# Patient Record
Sex: Male | Born: 1935 | Race: White | Hispanic: No | Marital: Married | Smoking: Never smoker
Health system: Southern US, Community
[De-identification: ages and names within clinical notes are randomized; demographics above are authoritative.]

## PROBLEM LIST (undated history)

## (undated) DIAGNOSIS — R972 Elevated prostate specific antigen [PSA]: Secondary | ICD-10-CM

## (undated) DIAGNOSIS — D4100 Neoplasm of uncertain behavior of unspecified kidney: Secondary | ICD-10-CM

## (undated) DIAGNOSIS — I10 Essential (primary) hypertension: Secondary | ICD-10-CM

## (undated) DIAGNOSIS — R5381 Other malaise: Secondary | ICD-10-CM

## (undated) DIAGNOSIS — D4120 Neoplasm of uncertain behavior of unspecified ureter: Secondary | ICD-10-CM

## (undated) DIAGNOSIS — M25569 Pain in unspecified knee: Secondary | ICD-10-CM

## (undated) DIAGNOSIS — L570 Actinic keratosis: Secondary | ICD-10-CM

## (undated) DIAGNOSIS — R1310 Dysphagia, unspecified: Secondary | ICD-10-CM

## (undated) DIAGNOSIS — K210 Gastro-esophageal reflux disease with esophagitis: Secondary | ICD-10-CM

## (undated) DIAGNOSIS — IMO0002 Reserved for concepts with insufficient information to code with codable children: Secondary | ICD-10-CM

## (undated) DIAGNOSIS — R202 Paresthesia of skin: Secondary | ICD-10-CM

## (undated) DIAGNOSIS — M25539 Pain in unspecified wrist: Secondary | ICD-10-CM

## (undated) DIAGNOSIS — R209 Unspecified disturbances of skin sensation: Secondary | ICD-10-CM

## (undated) DIAGNOSIS — I4891 Unspecified atrial fibrillation: Secondary | ICD-10-CM

## (undated) DIAGNOSIS — M461 Sacroiliitis, not elsewhere classified: Secondary | ICD-10-CM

## (undated) DIAGNOSIS — C4491 Basal cell carcinoma of skin, unspecified: Secondary | ICD-10-CM

## (undated) DIAGNOSIS — K648 Other hemorrhoids: Secondary | ICD-10-CM

## (undated) DIAGNOSIS — Z800 Family history of malignant neoplasm of digestive organs: Secondary | ICD-10-CM

## (undated) DIAGNOSIS — K219 Gastro-esophageal reflux disease without esophagitis: Secondary | ICD-10-CM

## (undated) DIAGNOSIS — C44621 Squamous cell carcinoma of skin of unspecified upper limb, including shoulder: Secondary | ICD-10-CM

## (undated) DIAGNOSIS — R5383 Other fatigue: Secondary | ICD-10-CM

## (undated) DIAGNOSIS — Z86010 Personal history of colonic polyps: Secondary | ICD-10-CM

## (undated) DIAGNOSIS — K5790 Diverticulosis of intestine, part unspecified, without perforation or abscess without bleeding: Secondary | ICD-10-CM

## (undated) DIAGNOSIS — Z8673 Personal history of transient ischemic attack (TIA), and cerebral infarction without residual deficits: Secondary | ICD-10-CM

## (undated) DIAGNOSIS — E785 Hyperlipidemia, unspecified: Secondary | ICD-10-CM

## (undated) DIAGNOSIS — M545 Low back pain: Secondary | ICD-10-CM

## (undated) DIAGNOSIS — K625 Hemorrhage of anus and rectum: Secondary | ICD-10-CM

## (undated) DIAGNOSIS — K635 Polyp of colon: Secondary | ICD-10-CM

## (undated) DIAGNOSIS — N401 Enlarged prostate with lower urinary tract symptoms: Secondary | ICD-10-CM

## (undated) DIAGNOSIS — S42309A Unspecified fracture of shaft of humerus, unspecified arm, initial encounter for closed fracture: Secondary | ICD-10-CM

## (undated) DIAGNOSIS — Z7901 Long term (current) use of anticoagulants: Secondary | ICD-10-CM

## (undated) DIAGNOSIS — D1800 Hemangioma unspecified site: Secondary | ICD-10-CM

## (undated) DIAGNOSIS — M5137 Other intervertebral disc degeneration, lumbosacral region: Secondary | ICD-10-CM

## (undated) DIAGNOSIS — H9319 Tinnitus, unspecified ear: Secondary | ICD-10-CM

## (undated) DIAGNOSIS — M1990 Unspecified osteoarthritis, unspecified site: Secondary | ICD-10-CM

## (undated) DIAGNOSIS — D126 Benign neoplasm of colon, unspecified: Secondary | ICD-10-CM

## (undated) DIAGNOSIS — L600 Ingrowing nail: Secondary | ICD-10-CM

## (undated) DIAGNOSIS — H9190 Unspecified hearing loss, unspecified ear: Secondary | ICD-10-CM

## (undated) DIAGNOSIS — M4120 Other idiopathic scoliosis, site unspecified: Secondary | ICD-10-CM

## (undated) HISTORY — DX: Other intervertebral disc degeneration, lumbosacral region: M51.37

## (undated) HISTORY — DX: Hemorrhage of anus and rectum: K62.5

## (undated) HISTORY — DX: Paresthesia of skin: R20.2

## (undated) HISTORY — DX: Family history of malignant neoplasm of digestive organs: Z80.0

## (undated) HISTORY — DX: Unspecified fracture of shaft of humerus, unspecified arm, initial encounter for closed fracture: S42.309A

## (undated) HISTORY — DX: Unspecified hearing loss, unspecified ear: H91.90

## (undated) HISTORY — DX: Other idiopathic scoliosis, site unspecified: M41.20

## (undated) HISTORY — DX: Benign neoplasm of colon, unspecified: D12.6

## (undated) HISTORY — DX: Benign prostatic hyperplasia with lower urinary tract symptoms: N40.1

## (undated) HISTORY — DX: Neoplasm of uncertain behavior of unspecified ureter: D41.20

## (undated) HISTORY — DX: Hyperlipidemia, unspecified: E78.5

## (undated) HISTORY — DX: Other fatigue: R53.83

## (undated) HISTORY — DX: Neoplasm of uncertain behavior of unspecified kidney: D41.00

## (undated) HISTORY — DX: Squamous cell carcinoma of skin of unspecified upper limb, including shoulder: C44.621

## (undated) HISTORY — DX: Actinic keratosis: L57.0

## (undated) HISTORY — DX: Unspecified osteoarthritis, unspecified site: M19.90

## (undated) HISTORY — DX: Ingrowing nail: L60.0

## (undated) HISTORY — DX: Long term (current) use of anticoagulants: Z79.01

## (undated) HISTORY — DX: Other hemorrhoids: K64.8

## (undated) HISTORY — DX: Pain in unspecified knee: M25.569

## (undated) HISTORY — DX: Unspecified atrial fibrillation: I48.91

## (undated) HISTORY — DX: Elevated prostate specific antigen (PSA): R97.20

## (undated) HISTORY — DX: Personal history of colonic polyps: Z86.010

## (undated) HISTORY — DX: Unspecified disturbances of skin sensation: R20.9

## (undated) HISTORY — DX: Low back pain: M54.5

## (undated) HISTORY — DX: Hemangioma unspecified site: D18.00

## (undated) HISTORY — DX: Personal history of transient ischemic attack (TIA), and cerebral infarction without residual deficits: Z86.73

## (undated) HISTORY — DX: Gastro-esophageal reflux disease with esophagitis: K21.0

## (undated) HISTORY — DX: Pain in unspecified wrist: M25.539

## (undated) HISTORY — DX: Essential (primary) hypertension: I10

## (undated) HISTORY — DX: Sacroiliitis, not elsewhere classified: M46.1

## (undated) HISTORY — DX: Reserved for concepts with insufficient information to code with codable children: IMO0002

## (undated) HISTORY — DX: Other malaise: R53.81

## (undated) HISTORY — DX: Basal cell carcinoma of skin, unspecified: C44.91

## (undated) HISTORY — DX: Polyp of colon: K63.5

## (undated) HISTORY — DX: Tinnitus, unspecified ear: H93.19

## (undated) HISTORY — DX: Diverticulosis of intestine, part unspecified, without perforation or abscess without bleeding: K57.90

## (undated) HISTORY — DX: Dysphagia, unspecified: R13.10

## (undated) HISTORY — DX: Gastro-esophageal reflux disease without esophagitis: K21.9

---

## 2007-12-17 HISTORY — PX: CRYOTHERAPY: SHX1416

## 2008-12-16 HISTORY — PX: SQUAMOUS CELL CARCINOMA EXCISION: SHX2433

## 2009-05-25 DIAGNOSIS — D4100 Neoplasm of uncertain behavior of unspecified kidney: Secondary | ICD-10-CM

## 2009-05-25 HISTORY — DX: Neoplasm of uncertain behavior of unspecified kidney: D41.00

## 2011-03-26 DIAGNOSIS — L600 Ingrowing nail: Secondary | ICD-10-CM

## 2011-03-26 DIAGNOSIS — N401 Enlarged prostate with lower urinary tract symptoms: Secondary | ICD-10-CM

## 2011-03-26 DIAGNOSIS — Z8673 Personal history of transient ischemic attack (TIA), and cerebral infarction without residual deficits: Secondary | ICD-10-CM

## 2011-03-26 DIAGNOSIS — R972 Elevated prostate specific antigen [PSA]: Secondary | ICD-10-CM

## 2011-03-26 DIAGNOSIS — I10 Essential (primary) hypertension: Secondary | ICD-10-CM

## 2011-03-26 DIAGNOSIS — I4891 Unspecified atrial fibrillation: Secondary | ICD-10-CM

## 2011-03-26 DIAGNOSIS — Z7901 Long term (current) use of anticoagulants: Secondary | ICD-10-CM

## 2011-03-26 DIAGNOSIS — D1800 Hemangioma unspecified site: Secondary | ICD-10-CM

## 2011-03-26 DIAGNOSIS — R5383 Other fatigue: Secondary | ICD-10-CM

## 2011-03-26 DIAGNOSIS — R1310 Dysphagia, unspecified: Secondary | ICD-10-CM

## 2011-03-26 DIAGNOSIS — K2100 Gastro-esophageal reflux disease with esophagitis, without bleeding: Secondary | ICD-10-CM

## 2011-03-26 DIAGNOSIS — IMO0002 Reserved for concepts with insufficient information to code with codable children: Secondary | ICD-10-CM

## 2011-03-26 DIAGNOSIS — M545 Low back pain, unspecified: Secondary | ICD-10-CM

## 2011-03-26 DIAGNOSIS — E785 Hyperlipidemia, unspecified: Secondary | ICD-10-CM | POA: Insufficient documentation

## 2011-03-26 DIAGNOSIS — N138 Other obstructive and reflux uropathy: Secondary | ICD-10-CM

## 2011-03-26 DIAGNOSIS — R5381 Other malaise: Secondary | ICD-10-CM

## 2011-03-26 DIAGNOSIS — C44621 Squamous cell carcinoma of skin of unspecified upper limb, including shoulder: Secondary | ICD-10-CM

## 2011-03-26 HISTORY — DX: Personal history of transient ischemic attack (TIA), and cerebral infarction without residual deficits: Z86.73

## 2011-03-26 HISTORY — DX: Low back pain, unspecified: M54.50

## 2011-03-26 HISTORY — DX: Dysphagia, unspecified: R13.10

## 2011-03-26 HISTORY — DX: Other obstructive and reflux uropathy: N13.8

## 2011-03-26 HISTORY — DX: Hyperlipidemia, unspecified: E78.5

## 2011-03-26 HISTORY — DX: Other fatigue: R53.83

## 2011-03-26 HISTORY — DX: Benign prostatic hyperplasia with lower urinary tract symptoms: N40.1

## 2011-03-26 HISTORY — DX: Other malaise: R53.81

## 2011-03-26 HISTORY — DX: Unspecified atrial fibrillation: I48.91

## 2011-03-26 HISTORY — DX: Long term (current) use of anticoagulants: Z79.01

## 2011-03-26 HISTORY — DX: Essential (primary) hypertension: I10

## 2011-03-26 HISTORY — DX: Elevated prostate specific antigen (PSA): R97.20

## 2011-03-26 HISTORY — DX: Reserved for concepts with insufficient information to code with codable children: IMO0002

## 2011-03-26 HISTORY — DX: Hemangioma unspecified site: D18.00

## 2011-03-26 HISTORY — DX: Gastro-esophageal reflux disease with esophagitis, without bleeding: K21.00

## 2011-03-26 HISTORY — DX: Ingrowing nail: L60.0

## 2011-03-26 HISTORY — DX: Squamous cell carcinoma of skin of unspecified upper limb, including shoulder: C44.621

## 2011-05-28 DIAGNOSIS — M4120 Other idiopathic scoliosis, site unspecified: Secondary | ICD-10-CM

## 2011-05-28 DIAGNOSIS — M461 Sacroiliitis, not elsewhere classified: Secondary | ICD-10-CM

## 2011-05-28 HISTORY — DX: Sacroiliitis, not elsewhere classified: M46.1

## 2011-05-28 HISTORY — DX: Other idiopathic scoliosis, site unspecified: M41.20

## 2011-05-30 ENCOUNTER — Ambulatory Visit
Admission: RE | Admit: 2011-05-30 | Discharge: 2011-05-30 | Disposition: A | Discharge status: Home / Self Care | Source: Ambulatory Visit | Attending: Internal Medicine | Admitting: Internal Medicine

## 2011-05-30 ENCOUNTER — Ambulatory Visit
Admission: RE | Admit: 2011-05-30 | Discharge: 2011-05-30 | Disposition: A | Discharge status: Home / Self Care | Payer: Medicare Other | Source: Ambulatory Visit | Attending: Internal Medicine | Admitting: Internal Medicine

## 2011-05-30 ENCOUNTER — Other Ambulatory Visit: Payer: Self-pay | Admitting: Internal Medicine

## 2011-05-30 DIAGNOSIS — R52 Pain, unspecified: Secondary | ICD-10-CM

## 2011-09-24 DIAGNOSIS — M51379 Other intervertebral disc degeneration, lumbosacral region without mention of lumbar back pain or lower extremity pain: Secondary | ICD-10-CM

## 2011-09-24 DIAGNOSIS — M5137 Other intervertebral disc degeneration, lumbosacral region: Secondary | ICD-10-CM

## 2011-09-24 HISTORY — DX: Other intervertebral disc degeneration, lumbosacral region without mention of lumbar back pain or lower extremity pain: M51.379

## 2011-09-24 HISTORY — DX: Other intervertebral disc degeneration, lumbosacral region: M51.37

## 2011-12-19 DIAGNOSIS — Z7901 Long term (current) use of anticoagulants: Secondary | ICD-10-CM | POA: Diagnosis not present

## 2011-12-20 DIAGNOSIS — Z7901 Long term (current) use of anticoagulants: Secondary | ICD-10-CM | POA: Diagnosis not present

## 2011-12-26 DIAGNOSIS — Z7901 Long term (current) use of anticoagulants: Secondary | ICD-10-CM | POA: Diagnosis not present

## 2012-01-02 DIAGNOSIS — Z7901 Long term (current) use of anticoagulants: Secondary | ICD-10-CM | POA: Diagnosis not present

## 2012-01-27 DIAGNOSIS — E785 Hyperlipidemia, unspecified: Secondary | ICD-10-CM | POA: Diagnosis not present

## 2012-01-27 DIAGNOSIS — M5137 Other intervertebral disc degeneration, lumbosacral region: Secondary | ICD-10-CM | POA: Diagnosis not present

## 2012-01-27 DIAGNOSIS — Z7901 Long term (current) use of anticoagulants: Secondary | ICD-10-CM | POA: Diagnosis not present

## 2012-01-27 DIAGNOSIS — M545 Low back pain, unspecified: Secondary | ICD-10-CM | POA: Diagnosis not present

## 2012-01-27 DIAGNOSIS — M51379 Other intervertebral disc degeneration, lumbosacral region without mention of lumbar back pain or lower extremity pain: Secondary | ICD-10-CM | POA: Diagnosis not present

## 2012-01-28 DIAGNOSIS — I4891 Unspecified atrial fibrillation: Secondary | ICD-10-CM | POA: Diagnosis not present

## 2012-01-28 DIAGNOSIS — M545 Low back pain, unspecified: Secondary | ICD-10-CM | POA: Diagnosis not present

## 2012-01-28 DIAGNOSIS — R209 Unspecified disturbances of skin sensation: Secondary | ICD-10-CM

## 2012-01-28 DIAGNOSIS — I10 Essential (primary) hypertension: Secondary | ICD-10-CM | POA: Diagnosis not present

## 2012-01-28 DIAGNOSIS — L570 Actinic keratosis: Secondary | ICD-10-CM

## 2012-01-28 HISTORY — DX: Actinic keratosis: L57.0

## 2012-01-28 HISTORY — DX: Unspecified disturbances of skin sensation: R20.9

## 2012-02-24 DIAGNOSIS — Z7901 Long term (current) use of anticoagulants: Secondary | ICD-10-CM | POA: Diagnosis not present

## 2012-03-02 DIAGNOSIS — Z7901 Long term (current) use of anticoagulants: Secondary | ICD-10-CM | POA: Diagnosis not present

## 2012-03-16 DIAGNOSIS — L570 Actinic keratosis: Secondary | ICD-10-CM | POA: Diagnosis not present

## 2012-03-16 DIAGNOSIS — L259 Unspecified contact dermatitis, unspecified cause: Secondary | ICD-10-CM | POA: Diagnosis not present

## 2012-03-16 DIAGNOSIS — L821 Other seborrheic keratosis: Secondary | ICD-10-CM | POA: Diagnosis not present

## 2012-03-25 DIAGNOSIS — D126 Benign neoplasm of colon, unspecified: Secondary | ICD-10-CM

## 2012-03-25 DIAGNOSIS — C4491 Basal cell carcinoma of skin, unspecified: Secondary | ICD-10-CM

## 2012-03-25 HISTORY — DX: Benign neoplasm of colon, unspecified: D12.6

## 2012-03-25 HISTORY — DX: Basal cell carcinoma of skin, unspecified: C44.91

## 2012-04-02 DIAGNOSIS — Z7901 Long term (current) use of anticoagulants: Secondary | ICD-10-CM | POA: Diagnosis not present

## 2012-05-18 DIAGNOSIS — I10 Essential (primary) hypertension: Secondary | ICD-10-CM | POA: Diagnosis not present

## 2012-05-18 DIAGNOSIS — E785 Hyperlipidemia, unspecified: Secondary | ICD-10-CM | POA: Diagnosis not present

## 2012-05-18 DIAGNOSIS — Z7901 Long term (current) use of anticoagulants: Secondary | ICD-10-CM | POA: Diagnosis not present

## 2012-05-18 DIAGNOSIS — I4891 Unspecified atrial fibrillation: Secondary | ICD-10-CM | POA: Diagnosis not present

## 2012-05-19 ENCOUNTER — Other Ambulatory Visit: Payer: Self-pay | Admitting: Internal Medicine

## 2012-05-19 DIAGNOSIS — I10 Essential (primary) hypertension: Secondary | ICD-10-CM | POA: Diagnosis not present

## 2012-05-19 DIAGNOSIS — I4891 Unspecified atrial fibrillation: Secondary | ICD-10-CM | POA: Diagnosis not present

## 2012-05-19 DIAGNOSIS — D4100 Neoplasm of uncertain behavior of unspecified kidney: Secondary | ICD-10-CM | POA: Diagnosis not present

## 2012-05-19 DIAGNOSIS — IMO0002 Reserved for concepts with insufficient information to code with codable children: Secondary | ICD-10-CM

## 2012-05-19 DIAGNOSIS — Z7901 Long term (current) use of anticoagulants: Secondary | ICD-10-CM | POA: Diagnosis not present

## 2012-05-21 ENCOUNTER — Ambulatory Visit
Admission: RE | Admit: 2012-05-21 | Discharge: 2012-05-21 | Disposition: A | Discharge status: Home / Self Care | Payer: Medicare Other | Source: Ambulatory Visit | Attending: Internal Medicine | Admitting: Internal Medicine

## 2012-05-21 DIAGNOSIS — N281 Cyst of kidney, acquired: Secondary | ICD-10-CM | POA: Diagnosis not present

## 2012-05-21 DIAGNOSIS — IMO0002 Reserved for concepts with insufficient information to code with codable children: Secondary | ICD-10-CM

## 2012-06-15 DIAGNOSIS — I4891 Unspecified atrial fibrillation: Secondary | ICD-10-CM | POA: Diagnosis not present

## 2012-06-15 DIAGNOSIS — Z7901 Long term (current) use of anticoagulants: Secondary | ICD-10-CM | POA: Diagnosis not present

## 2012-07-13 DIAGNOSIS — I4891 Unspecified atrial fibrillation: Secondary | ICD-10-CM | POA: Diagnosis not present

## 2012-07-13 DIAGNOSIS — Z7901 Long term (current) use of anticoagulants: Secondary | ICD-10-CM | POA: Diagnosis not present

## 2012-08-13 DIAGNOSIS — I4891 Unspecified atrial fibrillation: Secondary | ICD-10-CM | POA: Diagnosis not present

## 2012-08-13 DIAGNOSIS — Z7901 Long term (current) use of anticoagulants: Secondary | ICD-10-CM | POA: Diagnosis not present

## 2012-09-10 DIAGNOSIS — Z7901 Long term (current) use of anticoagulants: Secondary | ICD-10-CM | POA: Diagnosis not present

## 2012-09-10 DIAGNOSIS — I4891 Unspecified atrial fibrillation: Secondary | ICD-10-CM | POA: Diagnosis not present

## 2012-09-15 DIAGNOSIS — L821 Other seborrheic keratosis: Secondary | ICD-10-CM | POA: Diagnosis not present

## 2012-09-15 DIAGNOSIS — L820 Inflamed seborrheic keratosis: Secondary | ICD-10-CM | POA: Diagnosis not present

## 2012-09-15 DIAGNOSIS — L570 Actinic keratosis: Secondary | ICD-10-CM | POA: Diagnosis not present

## 2012-09-15 DIAGNOSIS — Z85828 Personal history of other malignant neoplasm of skin: Secondary | ICD-10-CM | POA: Diagnosis not present

## 2012-09-15 DIAGNOSIS — L578 Other skin changes due to chronic exposure to nonionizing radiation: Secondary | ICD-10-CM | POA: Diagnosis not present

## 2012-10-08 DIAGNOSIS — I4891 Unspecified atrial fibrillation: Secondary | ICD-10-CM | POA: Diagnosis not present

## 2012-10-08 DIAGNOSIS — Z7901 Long term (current) use of anticoagulants: Secondary | ICD-10-CM | POA: Diagnosis not present

## 2012-10-15 DIAGNOSIS — Z23 Encounter for immunization: Secondary | ICD-10-CM | POA: Diagnosis not present

## 2012-10-23 DIAGNOSIS — L570 Actinic keratosis: Secondary | ICD-10-CM | POA: Diagnosis not present

## 2012-11-05 DIAGNOSIS — I4891 Unspecified atrial fibrillation: Secondary | ICD-10-CM | POA: Diagnosis not present

## 2012-11-05 DIAGNOSIS — Z7901 Long term (current) use of anticoagulants: Secondary | ICD-10-CM | POA: Diagnosis not present

## 2012-11-09 DIAGNOSIS — Z7901 Long term (current) use of anticoagulants: Secondary | ICD-10-CM | POA: Diagnosis not present

## 2012-11-16 DIAGNOSIS — Z7901 Long term (current) use of anticoagulants: Secondary | ICD-10-CM | POA: Diagnosis not present

## 2012-11-16 DIAGNOSIS — R972 Elevated prostate specific antigen [PSA]: Secondary | ICD-10-CM | POA: Diagnosis not present

## 2012-11-16 DIAGNOSIS — I10 Essential (primary) hypertension: Secondary | ICD-10-CM | POA: Diagnosis not present

## 2012-11-24 DIAGNOSIS — I10 Essential (primary) hypertension: Secondary | ICD-10-CM | POA: Diagnosis not present

## 2012-11-24 DIAGNOSIS — Z7901 Long term (current) use of anticoagulants: Secondary | ICD-10-CM | POA: Diagnosis not present

## 2012-11-24 DIAGNOSIS — M545 Low back pain, unspecified: Secondary | ICD-10-CM | POA: Diagnosis not present

## 2012-11-24 DIAGNOSIS — M25539 Pain in unspecified wrist: Secondary | ICD-10-CM

## 2012-11-24 DIAGNOSIS — M1990 Unspecified osteoarthritis, unspecified site: Secondary | ICD-10-CM

## 2012-11-24 DIAGNOSIS — I4891 Unspecified atrial fibrillation: Secondary | ICD-10-CM | POA: Diagnosis not present

## 2012-11-24 HISTORY — DX: Unspecified osteoarthritis, unspecified site: M19.90

## 2012-11-24 HISTORY — DX: Pain in unspecified wrist: M25.539

## 2012-12-16 DIAGNOSIS — K635 Polyp of colon: Secondary | ICD-10-CM

## 2012-12-16 DIAGNOSIS — K5790 Diverticulosis of intestine, part unspecified, without perforation or abscess without bleeding: Secondary | ICD-10-CM

## 2012-12-16 HISTORY — DX: Polyp of colon: K63.5

## 2012-12-16 HISTORY — DX: Diverticulosis of intestine, part unspecified, without perforation or abscess without bleeding: K57.90

## 2012-12-17 DIAGNOSIS — I4891 Unspecified atrial fibrillation: Secondary | ICD-10-CM | POA: Diagnosis not present

## 2012-12-17 DIAGNOSIS — Z7901 Long term (current) use of anticoagulants: Secondary | ICD-10-CM | POA: Diagnosis not present

## 2012-12-24 DIAGNOSIS — L821 Other seborrheic keratosis: Secondary | ICD-10-CM | POA: Diagnosis not present

## 2012-12-24 DIAGNOSIS — Z85828 Personal history of other malignant neoplasm of skin: Secondary | ICD-10-CM | POA: Diagnosis not present

## 2012-12-24 DIAGNOSIS — L570 Actinic keratosis: Secondary | ICD-10-CM | POA: Diagnosis not present

## 2013-02-26 DIAGNOSIS — L03039 Cellulitis of unspecified toe: Secondary | ICD-10-CM | POA: Diagnosis not present

## 2013-02-26 DIAGNOSIS — L600 Ingrowing nail: Secondary | ICD-10-CM | POA: Diagnosis not present

## 2013-05-24 DIAGNOSIS — I10 Essential (primary) hypertension: Secondary | ICD-10-CM | POA: Diagnosis not present

## 2013-05-24 DIAGNOSIS — Z7901 Long term (current) use of anticoagulants: Secondary | ICD-10-CM | POA: Diagnosis not present

## 2013-05-24 DIAGNOSIS — I4891 Unspecified atrial fibrillation: Secondary | ICD-10-CM | POA: Diagnosis not present

## 2013-05-24 DIAGNOSIS — E785 Hyperlipidemia, unspecified: Secondary | ICD-10-CM | POA: Diagnosis not present

## 2013-05-24 DIAGNOSIS — R5381 Other malaise: Secondary | ICD-10-CM | POA: Diagnosis not present

## 2013-06-15 ENCOUNTER — Encounter: Payer: Self-pay | Admitting: Internal Medicine

## 2013-06-15 ENCOUNTER — Non-Acute Institutional Stay: Payer: Medicare Other | Admitting: Internal Medicine

## 2013-06-15 ENCOUNTER — Telehealth: Payer: Self-pay

## 2013-06-15 VITALS — BP 122/84 | HR 60 | Temp 97.5°F | Ht 66.5 in | Wt 190.0 lb

## 2013-06-15 DIAGNOSIS — K648 Other hemorrhoids: Secondary | ICD-10-CM

## 2013-06-15 DIAGNOSIS — E785 Hyperlipidemia, unspecified: Secondary | ICD-10-CM

## 2013-06-15 DIAGNOSIS — M25569 Pain in unspecified knee: Secondary | ICD-10-CM

## 2013-06-15 DIAGNOSIS — D126 Benign neoplasm of colon, unspecified: Secondary | ICD-10-CM | POA: Diagnosis not present

## 2013-06-15 DIAGNOSIS — I4891 Unspecified atrial fibrillation: Secondary | ICD-10-CM

## 2013-06-15 DIAGNOSIS — I10 Essential (primary) hypertension: Secondary | ICD-10-CM

## 2013-06-15 DIAGNOSIS — R972 Elevated prostate specific antigen [PSA]: Secondary | ICD-10-CM

## 2013-06-15 DIAGNOSIS — M25562 Pain in left knee: Secondary | ICD-10-CM

## 2013-06-15 DIAGNOSIS — L600 Ingrowing nail: Secondary | ICD-10-CM

## 2013-06-15 DIAGNOSIS — K625 Hemorrhage of anus and rectum: Secondary | ICD-10-CM | POA: Diagnosis not present

## 2013-06-15 HISTORY — DX: Other hemorrhoids: K64.8

## 2013-06-15 HISTORY — DX: Pain in unspecified knee: M25.569

## 2013-06-15 HISTORY — DX: Hemorrhage of anus and rectum: K62.5

## 2013-06-15 NOTE — Progress Notes (Signed)
Date: 06/15/2013  MRN:  [id number] Name:  Phillip [name] Sex:  male Age:  79 y.o. DOB:[birth year]   [facility] #:   [id number]                    Facility/Room; [facility] Level Of Care: independent Provider: Murray Hodgkins, M.D.  Emergency Contacts: Contact Information   Name Relation Home Work Mobile   Kupfer,Anne Spouse [phone]        Code Status: Full code  Allergies: Allergies  Allergen Reactions  . Ciprofloxacin   . Sulfa Antibiotics   . Zestril (Lisinopril)      Chief Complaint  Patient presents with  . Medical Managment of Chronic Issues    Comprehensive Exam: blood pressure, cholesterol, A-Fib. c/o pain left knee (still plays tennis); Last colonoscopy was 10/2010 history of polyps, has hemorrhoids, some constipation, some bleeding past couple of weeks, twice on Eliqus.      HPI: Pain in joint, lower leg, left: Complaint painful left knee under the care of immediately. Worse times occur when he is playing tennis for a couple of hours. He does seem to get better within an hour or 2. Use of Aleve seems to help. He has not had any significant joint swelling.  Internal hemorrhoids without mention of complication: Chronic issue related to recent complaints of rectal bleeding.  Rectal bleed: Small amount of tissue paper. Return.  Benign neoplasm of colon: History of multiple adenomatous polyps removed on several colonoscopies in the past. His last colonoscopy was about 3 years ago and he feels it is time to have another colonoscopy. He has no abdominal pain or discomfort, but there have been episodes of recent rectal bleeding which are most likely related to internal hemorrhoids. He has had some mild constipation associated with the episodes of rectal bleeding.  Atrial fibrillation: Currently in normal sinus rhythm. Long-standing history of intermittent episodes of atrial fibrillation. He remains on Eliquis as well as beta blockers to maintain his heart rhythm.  Ingrowing  nail: Dr. Sherilyn Cooter did surgery and a portion of the medial side of the left great toenail. He has had significantly from previous ingrowing nail as result of this.  Other and unspecified hyperlipidemia: Well controlled on current medications  Unspecified essential hypertension : Well controlled on current medication  Elevated prostate specific antigen (PSA): Has not been rechecked recently.    Past Medical History  Diagnosis Date  . Osteoarthrosis, unspecified whether generalized or localized, unspecified site 11/24/2012  . Pain in joint, forearm 11/24/2012  . Keratosis, actinic 01/28/2012  . Disturbance of skin sensation 01/28/2012  . Degeneration of lumbar or lumbosacral intervertebral disc 09/24/2011  . Sacroiliitis, not elsewhere classified 05/28/2011  . Scoliosis (and kyphoscoliosis), idiopathic 05/28/2011  . 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 Basal cell carcinoma of other specified sites of skin 4/10/012  . Basal cell carcinoma of skin, site unspecified 03/25/2012  . Benign neoplasm of colon 03/25/2012    adenomatous polyps  . Hemangioma of unspecified site 03/26/2011  . Other and unspecified hyperlipidemia 03/26/2011  . Unspecified essential hypertension 03/26/2011  . Atrial fibrillation 03/26/2011  . Reflux esophagitis 03/26/2011  . Hypertrophy of prostate with urinary obstruction and other lower urinary tract symptoms (LUTS) 03/26/2011  . Ingrowing nail 03/26/2011  . Lumbago 03/26/2011  . Other malaise and fatigue 03/26/2011  . Dysphagia, unspecified(787.20) 03/26/2011  . Elevated prostate specific antigen (PSA) 03/26/2011  . Exposure to unspecified radiation 03/26/2011  .  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 Long term (current) use of anticoagulants 03/26/2011  . 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 Pain in joint, lower leg 06/15/13    left knee  .  Internal hemorrhoids without mention of complication 06/15/13  . Rectal bleed 06/15/13    Past Surgical History  Procedure Laterality Date  . Squamous cell carcinoma excision Left 2010    hand  . Cryotherapy Left 2009    hand     Procedures: 1997 colonoscopy: 4 polyps removed, no cancer. 1998 colonoscopy showed no polyps  2001 colonoscopy showing normal 2003-Cystoscopies-Dr. Danneberger and Dr. Diamantina Providence 2003-Renal Sonogram 2006-Prostate Biopsy-Dr. Lenise Arena  2006 EGD 2006 colonoscopy: Normal 2007 CT scan 2007-Cystoscopies 2009-Skin Biopsy and cryotherapy.   06/06/09 CT scan of abdomen: Small left renal mass is unchanged and likely benign 11/12/2010 Colonoscopy-polyps done in [place]  Consultants:  GI-Dr. Gwyneth Sprout Dermatologist-Dr. Marin Comment, Dr. [name] Cardiologist-Dr. Christella Hartigan Neurologist-Dr. Phoebe Sharps Urologist-Dr. Lauris Poag   Current Outpatient Prescriptions  Medication Sig Dispense Refill  . amLODipine (NORVASC) 2.5 MG tablet Take 2.5 mg by mouth daily. To control blood pressure      . apixaban (ELIQUIS) 5 MG TABS tablet Take 5 mg by mouth. Take one tablet twice daily for anticoagulation      . B Complex-C (SUPER B COMPLEX PO) Take by mouth. Take one tablet daily      . cyanocobalamin 1000 MCG tablet Take 100 mcg by mouth daily.      Marland Kitchen esomeprazole (NEXIUM) 40 MG capsule Take 40 mg by mouth daily before breakfast. For stomach      . finasteride (PROSCAR) 5 MG tablet Take 5 mg by mouth daily. For prostate      . metoprolol succinate (TOPROL-XL) 50 MG 24 hr tablet Take 50 mg by mouth. Take one tablet twice daily for blood pressure Take with or immediately following a meal.      . Multiple Vitamin (ONE-A-DAY MENS PO) Take by mouth. Take one tablet daily      . naproxen sodium (ANAPROX) 220 MG tablet Take 220 mg by mouth. Take one up to twice daily for arthritis as needed      . simvastatin (ZOCOR) 10 MG tablet Take 10 mg by mouth at bedtime. To control  cholesterol      . terazosin (HYTRIN) 2 MG capsule Take 2 mg by mouth at bedtime.      . valsartan (DIOVAN) 320 MG tablet Take 320 mg by mouth daily. To control blood pressure      . Vitamin A-Vitamin D-Minerals (SUPER D3 COMPLEX PO) Take by mouth. Take one tablet daily       No current facility-administered medications for this visit.    Immunization History  Administered Date(s) Administered  . Influenza Whole 09/15/2012  . Pneumococcal Polysaccharide 01/17/1996  . Td 02/14/2006     Diet: regular  History  Substance Use Topics  . Smoking status: Never Smoker   . Smokeless tobacco: Never Used  . Alcohol Use: No    Family History  Problem Relation Age of Onset  . Cancer Mother     colon  . Heart disease Father     MI    Review of Systems DATA OBTAINED: from patient GENERAL: Feels well   No fevers, fatigue, change in appetite or weight SKIN: No itch, rash or open wounds EYES: No eye pain, dryness or itching  No change in vision.  Corrective lenses EARS: No earache, tinnitus. Bilateral loss of hearing. Chronic tinnitus. NOSE: No congestion, drainage or bleeding MOUTH/THROAT: No mouth or tooth pain  No sore throat No difficulty chewing or swallowing RESPIRATORY: No cough, wheezing, SOB CARDIAC: No chest pain, palpitations  No edema. CHEST/BREASTS: No discomfort, discharge or lumps in breasts GI: No abdominal pain  No N/V/D.  No heartburn or reflux. Denies dysphagia. Recent episodes of intermittent rectal bleeding of small quantities. Has history of internal hemorrhoids and of adenomatous polyps found on several colonoscopies. Mother died of colon cancer. GU: No dysuria, frequency or urgency  No change in urine volume or character No nocturia or change in stream   MUSCULOSKELETAL: Chronic low back discomfort. Left knee discomfort under the patella and medially  Gait is steady  No recent falls.  NEUROLOGIC: No dizziness, fainting, headache, imbalance, numbness  No change in  mental status. Prior history of TIA versus possible transient global amnesia. PSYCHIATRIC: No feelings of anxiety, depression Sleeps well.  No behavior issue.   Physical Exam Vital signs: BP 122/84  Pulse 60  Temp(Src) 97.5 F (36.4 C) (Oral)  Ht 5' 6.5" (1.689 m)  Wt 190 lb (86.183 kg)  BMI 30.21 kg/m2  GENERAL APPEARANCE: No acute distress, appropriately groomed, normal body habitus. Alert, pleasant, conversant. HEAD: Normocephalic, atraumatic EYES: Conjunctiva/lids clear. Pupils round, reactive. EOMs intact. Wears corrective lenses EARS: External exam WNL, canals clear, TM WNL. Hearing impaired bilaterally. NOSE: No deformity or discharge. MOUTH/THROAT: Lips w/o lesions. Oral mucosa, tongue moist, w/o lesion. Oropharynx w/o redness or lesions.  NECK: Supple, full ROM. No thyroid tenderness, enlargement or nodule LYMPHATICS: No head, neck or supraclavicular adenopathy RESPIRATORY: Breathing is even, unlabored. Lung sounds are clear and full.  CARDIOVASCULAR: Heart RRR. No murmur or extra heart sounds  ARTERIAL: No carotid, aortic or femoral bruit. Carotid, Femoral, Popliteal, DP,PT pulse 2+.  VENOUS: No varicosities. No venous stasis skin changes  EDEMA: No peripheral or periorbital edema. No ascites GASTROINTESTINAL: Abdomen is soft, non-tender, not distended w/ normal bowel sounds. No hepatic or splenic enlargement. No mass, ventral or inguinal hernia.  RECTAL: No anal fissure, skin tag or external hemorrhoid. Sphincter tone WNL. Stool is brown, heme negative. No palpable mass or hemorrhoid thrombosis. GENITOURINARY: Bladder non tender, not distended.  MALE: No penile lesion or drainage. No scrotal edema, tenderness or mass. Prostate 1+ enlarged. MUSCULOSKELETAL: Moves all extremities with full ROM, strength and tone. Back is without kyphosis, scoliosis or spinal process tenderness. Gait is steady. Mild tenderness at medial side of the left patella. NEUROLOGIC: Oriented to time,  place, person. Cranial nerves 2-12 grossly intact, speech clear, no tremor. Patella, brachial DTR 2+. PSYCHIATRIC: Mood and affect appropriate to situation   Screening Score  MMS    PHQ2 0  PHQ9     Fall Risk    BIMS    Annual summary: Hospitalizations: None in the last year Infection History: No significant infections Functional assessment: Independent in all ADL Areas of potential improvement: Functioning at high achievable level Rehabilitation Potential: Not applicable Prognosis for survival: Good  Plan: Pain in joint, lower leg, left - Plan: Ambulatory referral to Orthopedic Surgery  Internal hemorrhoids without mention of complication - Plan: Ambulatory referral to Gastroenterology  Rectal bleed - Plan: Ambulatory referral to Gastroenterology  Benign neoplasm of colon - Plan: Ambulatory referral to Gastroenterology  Atrial fibrillation: Currently in normal sinus rhythm. Continue current medication.  Ingrowing nail: Resolved  Other and unspecified hyperlipidemia: Well controlled on current medication  Unspecified essential hypertension: Well controlled on current medication   - Plan: Basic metabolic panel, Lipid panel  Elevated prostate specific antigen (PSA): Followup prior to her next visit   - Plan: PSA

## 2013-06-15 NOTE — Patient Instructions (Signed)
Continue current medications. 

## 2013-06-15 NOTE — Telephone Encounter (Signed)
Call patient with appts: Dr. Sena Slate Orthopedic referral  06/24/13 at 2:45 left knee pain; Dr. Rhea Belton 07/16/13 at 10:30 GI referral for colon polyps rectal bleeding. Patient aware. Kaylyn Layer, CMA

## 2013-06-24 DIAGNOSIS — M25569 Pain in unspecified knee: Secondary | ICD-10-CM | POA: Diagnosis not present

## 2013-07-02 DIAGNOSIS — L821 Other seborrheic keratosis: Secondary | ICD-10-CM | POA: Diagnosis not present

## 2013-07-02 DIAGNOSIS — D1801 Hemangioma of skin and subcutaneous tissue: Secondary | ICD-10-CM | POA: Diagnosis not present

## 2013-07-02 DIAGNOSIS — Z85828 Personal history of other malignant neoplasm of skin: Secondary | ICD-10-CM | POA: Diagnosis not present

## 2013-07-02 DIAGNOSIS — L820 Inflamed seborrheic keratosis: Secondary | ICD-10-CM | POA: Diagnosis not present

## 2013-07-02 DIAGNOSIS — L570 Actinic keratosis: Secondary | ICD-10-CM | POA: Diagnosis not present

## 2013-07-12 ENCOUNTER — Encounter: Payer: Self-pay | Admitting: Internal Medicine

## 2013-07-14 ENCOUNTER — Encounter: Payer: Self-pay | Admitting: Internal Medicine

## 2013-07-16 ENCOUNTER — Encounter: Payer: Self-pay | Admitting: Internal Medicine

## 2013-07-16 ENCOUNTER — Ambulatory Visit (INDEPENDENT_AMBULATORY_CARE_PROVIDER_SITE_OTHER): Payer: Medicare Other | Admitting: Internal Medicine

## 2013-07-16 VITALS — BP 130/80 | HR 64 | Ht 67.0 in | Wt 188.4 lb

## 2013-07-16 DIAGNOSIS — K219 Gastro-esophageal reflux disease without esophagitis: Secondary | ICD-10-CM

## 2013-07-16 DIAGNOSIS — Z800 Family history of malignant neoplasm of digestive organs: Secondary | ICD-10-CM | POA: Insufficient documentation

## 2013-07-16 DIAGNOSIS — Z86010 Personal history of colonic polyps: Secondary | ICD-10-CM

## 2013-07-16 DIAGNOSIS — Z1211 Encounter for screening for malignant neoplasm of colon: Secondary | ICD-10-CM

## 2013-07-16 DIAGNOSIS — K625 Hemorrhage of anus and rectum: Secondary | ICD-10-CM

## 2013-07-16 DIAGNOSIS — K649 Unspecified hemorrhoids: Secondary | ICD-10-CM

## 2013-07-16 DIAGNOSIS — Z860101 Personal history of adenomatous and serrated colon polyps: Secondary | ICD-10-CM

## 2013-07-16 HISTORY — DX: Personal history of colonic polyps: Z86.010

## 2013-07-16 HISTORY — DX: Personal history of adenomatous and serrated colon polyps: Z86.0101

## 2013-07-16 HISTORY — DX: Family history of malignant neoplasm of digestive organs: Z80.0

## 2013-07-16 HISTORY — DX: Gastro-esophageal reflux disease without esophagitis: K21.9

## 2013-07-16 MED ORDER — MOVIPREP 100 G PO SOLR: ORAL | Status: DC

## 2013-07-16 NOTE — Patient Instructions (Addendum)
You have been scheduled for a colonoscopy with propofol. Please follow written instructions given to you at your visit today.  Please pick up your prep kit at the pharmacy within the next 1-3 days. If you use inhalers (even only as needed), please bring them with you on the day of your procedure. Your physician has requested that you go to www.startemmi.com and enter the access code given to you at your visit today. This web site gives a general overview about your procedure. However, you should still follow specific instructions given to you by our office regarding your preparation for the procedure.  WE WILL CONTACT YOU ABOUT HOLDING YOUR ELIQUIS                                               We are excited to introduce MyChart, a new best-in-class service that provides you online access to important information in your electronic medical record. We want to make it easier for you to view your health information - all in one secure location - when and where you need it. We expect MyChart will enhance the quality of care and service we provide.  When you register for MyChart, you can:    View your test results.    Request appointments and receive appointment reminders via email.    Request medication renewals.    View your medical history, allergies, medications and immunizations.    Communicate with your physician's office through a password-protected site.    Conveniently print information such as your medication lists.  To find out if MyChart is right for you, please talk to a member of our clinical staff today. We will gladly answer your questions about this free health and wellness tool.  If you are age 59 or older and want a member of your family to have access to your record, you must provide written consent by completing a proxy form available at our office. Please speak to our clinical staff about guidelines regarding accounts for patients younger than age 17.  As you activate  your MyChart account and need any technical assistance, please call the MyChart technical support line at [phone]) or email your question to [email or web address]. If you email your question(s), please include your name, a return phone number and the best time to reach you.  If you have non-urgent health-related questions, you can send a message to our office through MyChart at [email or web address]. If you have a medical emergency, call 911.  Thank you for using MyChart as your new health and wellness resource!   MyChart licensed from [employer],  [id number]. Patents Pending.

## 2013-07-16 NOTE — Progress Notes (Signed)
Patient ID: Phillip [name], male   DOB: Jan 02, 1929, 79 y.o.   MRN: [id number] HPI: Phillip [name] is an 79 year old male with a past medical history of adenomatous colon polyps, osteoarthritis, atrial fibrillation on Eliquis with history of TIA, BPH, diverticulosis and internal hemorrhoids who seen in consultation at the request of Dr. Chilton Si for consideration of surveillance colonoscopy and minor rectal bleeding. He is alone today. He reports that he is feeling well. He does have a history of intermittent constipation. He notes that several times over the last 6-12 months he has noted bright red blood with wiping. He denies blood mixed with his stool. He does he does seem a rectal bleeding it is usually after passing a hard stool. It is painless in nature. No abdominal pain. No weight loss. No change in bowel habit. He is eating well. No dysphagia, odynophagia.  He has a history of acid reflux disease but this is well-controlled with Nexium daily. His last colonoscopy was 3 years ago and he brings a copy of this report. He's had 5 colonoscopies over his lifetime. His mother died of colorectal cancer at age 67. He denies chest pain, dyspnea. He is interested in repeat colorectal cancer screening/surveillance  Past Medical History  Diagnosis Date  . Osteoarthrosis, unspecified whether generalized or localized, unspecified site 11/24/2012  . Pain in joint, forearm 11/24/2012  . Keratosis, actinic 01/28/2012  . Disturbance of skin sensation 01/28/2012  . Degeneration of lumbar or lumbosacral intervertebral disc 09/24/2011  . Sacroiliitis, not elsewhere classified 05/28/2011  . Scoliosis (and kyphoscoliosis), idiopathic 05/28/2011  . 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 Basal cell carcinoma of other specified sites of skin 4/10/012  . Basal cell carcinoma of skin, site unspecified 03/25/2012  . Benign neoplasm of colon 03/25/2012    adenomatous polyps  . Hemangioma of  unspecified site 03/26/2011  . Other and unspecified hyperlipidemia 03/26/2011  . Unspecified essential hypertension 03/26/2011  . Atrial fibrillation 03/26/2011  . Reflux esophagitis 03/26/2011  . Hypertrophy of prostate with urinary obstruction and other lower urinary tract symptoms (LUTS) 03/26/2011  . Ingrowing nail 03/26/2011  . Lumbago 03/26/2011  . Other malaise and fatigue 03/26/2011  . Dysphagia, unspecified(787.20) 03/26/2011  . Elevated prostate specific antigen (PSA) 03/26/2011  . Exposure to unspecified radiation 03/26/2011  . 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 Long term (current) use of anticoagulants 03/26/2011  . 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 Pain in joint, lower leg 06/15/13    left knee  . Internal hemorrhoids without mention of complication 06/15/13  . Rectal bleed 06/15/13  . Diverticulosis   . Colon polyp     Past Surgical History  Procedure Laterality Date  . Squamous cell carcinoma excision Left 2010    hand  . Cryotherapy Left 2009    hand    Current Outpatient Prescriptions  Medication Sig Dispense Refill  . amLODipine (NORVASC) 2.5 MG tablet Take 2.5 mg by mouth daily. To control blood pressure      . apixaban (ELIQUIS) 5 MG TABS tablet Take 5 mg by mouth. Take one tablet twice daily for anticoagulation      . B Complex-C (SUPER B COMPLEX PO) Take by mouth. Take one tablet daily      . cyanocobalamin 1000 MCG tablet Take 100 mcg by mouth daily.      Marland Kitchen esomeprazole (NEXIUM) 40 MG capsule Take 40 mg by  mouth daily before breakfast. For stomach      . finasteride (PROSCAR) 5 MG tablet Take 5 mg by mouth daily. For prostate      . metoprolol succinate (TOPROL-XL) 50 MG 24 hr tablet Take 50 mg by mouth. Take one tablet twice daily for blood pressure Take with or immediately following a meal.      . Multiple Vitamin (ONE-A-DAY MENS PO) Take by mouth. Take one tablet daily       . naproxen sodium (ANAPROX) 220 MG tablet Take 220 mg by mouth. Take one up to twice daily for arthritis as needed      . simvastatin (ZOCOR) 10 MG tablet Take 10 mg by mouth at bedtime. To control cholesterol      . terazosin (HYTRIN) 2 MG capsule Take 2 mg by mouth at bedtime.      . valsartan (DIOVAN) 320 MG tablet Take 320 mg by mouth daily. To control blood pressure      . Vitamin A-Vitamin D-Minerals (SUPER D3 COMPLEX PO) Take by mouth. Take one tablet daily      . MOVIPREP 100 G SOLR Use per prep instruction  1 kit  0   No current facility-administered medications for this visit.    Allergies  Allergen Reactions  . Ciprofloxacin   . Sulfa Antibiotics   . Zestril (Lisinopril)     Family History  Problem Relation Age of Onset  . Cancer Mother     colon  . Heart disease Father     MI    History  Substance Use Topics  . Smoking status: Never Smoker   . Smokeless tobacco: Never Used  . Alcohol Use: No    ROS: As per history of present illness, otherwise negative  BP 130/80  Pulse 64  Ht 5\' 7"  (1.702 m)  Wt 188 lb 6 oz (85.446 kg)  BMI 29.5 kg/m2 Constitutional: Well-developed and well-nourished. No distress. HEENT: Normocephalic and atraumatic. Oropharynx is clear and moist. No oropharyngeal exudate. Conjunctivae are normal.  No scleral icterus. Neck: Neck supple. Trachea midline. Cardiovascular: Normal rate, regular rhythm and intact distal pulses. No M/R/G Pulmonary/chest: Effort normal and breath sounds normal. No wheezing, rales or rhonchi. Abdominal: Soft, nontender, nondistended. Bowel sounds active throughout. There are no masses palpable. No hepatosplenomegaly. Extremities: no clubbing, cyanosis, or edema Lymphadenopathy: No cervical adenopathy noted. Neurological: Alert and oriented to person place and time. Skin: Skin is warm and dry. No rashes noted. Psychiatric: Normal mood and affect. Behavior is normal.  RELEVANT LABS AND IMAGING:  colonoscopy  dated 11/12/2010, Dr. Fayrene Helper -- 3 polyps removed, diverticulosis in the left colon (adenomatous polyps by documentation, recommended repeat 3 years)  Labs dated 05/24/2013 - WBC 7.5, hemoglobin 13.4, hematocrit 38.4, MCV 90.6, platelet count 174 Conference of metabolic panel within normal limits except for mild elevation in glucose at 101  ASSESSMENT/PLAN: 79 year old male with a past medical history of adenomatous colon polyps, osteoarthritis, atrial fibrillation on Eliquis with history of TIA, BPH, diverticulosis and internal hemorrhoids who seen in consultation at the request of Dr. Chilton Si for consideration of surveillance colonoscopy and minor rectal bleeding  1.  History of adenomatous colon polyps, family history of colon cancer, minor rectal bleeding with history of hemorrhoids -- it has been nearly 3 years since his last screening/surveillance colonoscopy. He had 3 adenomatous colon polyps at his last visit and guidelines support repeat screening at 3 years. He is otherwise well with well-managed chronic medical conditions and I think  he is appropriate for repeat colonoscopy. We discussed this test including the risks and benefits and he is agreeable to proceed. His Eliquis will need to be held for 48 hours before the procedure and perhaps longer if polyps are removed. We will get permission from Dr. Chilton Si to hold this medication before his procedure. His rectal bleeding, which is minor and infrequent is likely related to hemorrhoids especially in the setting of chronic anticoagulation. Colonoscopy will help rule out other possible causes of rectal bleeding. Hemorrhoids are found we can treat with Anusol suppositories on an as-needed basis  2. GERD -- well-controlled Nexium. He will continue this current dose for now. He reports a previous upper endoscopy with no history of Barrett's.

## 2013-07-22 ENCOUNTER — Encounter: Payer: Self-pay | Admitting: Internal Medicine

## 2013-08-06 ENCOUNTER — Telehealth: Payer: Self-pay | Admitting: Geriatric Medicine

## 2013-08-06 ENCOUNTER — Telehealth: Payer: Self-pay | Admitting: Internal Medicine

## 2013-08-06 NOTE — Telephone Encounter (Signed)
Patient takes eloquis. He is scheduled for a colonoscopy next week. Does he need to stop his blood thinner and if so, for how long? Please advise and have medical assistant call [phone].

## 2013-08-06 NOTE — Telephone Encounter (Signed)
lvm for Dr. Amanda Cockayne Nurse asking about pt's bloodthinner, and how long he will need to be off of it before procedure.

## 2013-08-08 NOTE — Telephone Encounter (Signed)
He should stop the Eliquis 5 days prior to the colonoscopy.

## 2013-08-09 NOTE — Telephone Encounter (Signed)
lvm for Dr. Amanda Cockayne nurse regarding the hold on pt's blood thinner

## 2013-08-09 NOTE — Telephone Encounter (Signed)
Dr. Amanda Cockayne office called back and said pt is to stop blood thinner 5 days prior to preocedure. Per pt. He stopped taking the medication on Friday 08/06/2013, he will resume after procedure scheduled 08/12/2013

## 2013-08-09 NOTE — Telephone Encounter (Signed)
Spoke with Misty Stanley at [facility]

## 2013-08-12 ENCOUNTER — Ambulatory Visit (AMBULATORY_SURGERY_CENTER): Payer: Medicare Other | Admitting: Internal Medicine

## 2013-08-12 ENCOUNTER — Encounter: Payer: Self-pay | Admitting: Internal Medicine

## 2013-08-12 VITALS — BP 143/74 | HR 57 | Temp 98.9°F | Resp 22 | Ht 67.0 in | Wt 188.0 lb

## 2013-08-12 DIAGNOSIS — D126 Benign neoplasm of colon, unspecified: Secondary | ICD-10-CM

## 2013-08-12 DIAGNOSIS — Z800 Family history of malignant neoplasm of digestive organs: Secondary | ICD-10-CM | POA: Diagnosis not present

## 2013-08-12 DIAGNOSIS — Z1211 Encounter for screening for malignant neoplasm of colon: Secondary | ICD-10-CM

## 2013-08-12 DIAGNOSIS — Z86010 Personal history of colonic polyps: Secondary | ICD-10-CM

## 2013-08-12 DIAGNOSIS — K625 Hemorrhage of anus and rectum: Secondary | ICD-10-CM | POA: Diagnosis not present

## 2013-08-12 DIAGNOSIS — I10 Essential (primary) hypertension: Secondary | ICD-10-CM | POA: Diagnosis not present

## 2013-08-12 DIAGNOSIS — I4891 Unspecified atrial fibrillation: Secondary | ICD-10-CM | POA: Diagnosis not present

## 2013-08-12 DIAGNOSIS — K219 Gastro-esophageal reflux disease without esophagitis: Secondary | ICD-10-CM | POA: Diagnosis not present

## 2013-08-12 HISTORY — PX: COLONOSCOPY: SHX174

## 2013-08-12 LAB — HM COLONOSCOPY

## 2013-08-12 MED ORDER — SODIUM CHLORIDE 0.9 % IV SOLN: 500.0000 mL | INTRAVENOUS | Status: DC

## 2013-08-12 NOTE — Progress Notes (Signed)
Patient did not experience any of the following events: a burn prior to discharge; a fall within the facility; wrong site/side/patient/procedure/implant event; or a hospital transfer or hospital admission upon discharge from the facility. (G8907)Patient did not have preoperative order for IV antibiotic SSI prophylaxis. (G8918) ewm 

## 2013-08-12 NOTE — Patient Instructions (Addendum)
YOU HAD AN ENDOSCOPIC PROCEDURE TODAY AT THE [facility]: Refer to the procedure report that was given to you for any specific questions about what was found during the examination.  If the procedure report does not answer your questions, please call your gastroenterologist to clarify.  If you requested that your care partner not be given the details of your procedure findings, then the procedure report has been included in a sealed envelope for you to review at your convenience later.  YOU SHOULD EXPECT: Some feelings of bloating in the abdomen. Passage of more gas than usual.  Walking can help get rid of the air that was put into your GI tract during the procedure and reduce the bloating. If you had a lower endoscopy (such as a colonoscopy or flexible sigmoidoscopy) you may notice spotting of blood in your stool or on the toilet paper. If you underwent a bowel prep for your procedure, then you may not have a normal bowel movement for a few days.  DIET: Your first meal following the procedure should be a light meal and then it is ok to progress to your normal diet.  A half-sandwich or bowl of soup is an example of a good first meal.  Heavy or fried foods are harder to digest and may make you feel nauseous or bloated.  Likewise meals heavy in dairy and vegetables can cause extra gas to form and this can also increase the bloating.  Drink plenty of fluids but you should avoid alcoholic beverages for 24 hours.  ACTIVITY: Your care partner should take you home directly after the procedure.  You should plan to take it easy, moving slowly for the rest of the day.  You can resume normal activity the day after the procedure however you should NOT DRIVE or use heavy machinery for 24 hours (because of the sedation medicines used during the test).    SYMPTOMS TO REPORT IMMEDIATELY: A gastroenterologist can be reached at any hour.  During normal business hours, 8:30 AM to 5:00 PM Monday through Friday,  call [phone].  After hours and on weekends, please call the GI answering service at [phone] emergency number-doctor on call- who will take a message and have the physician on call contact you.   Following lower endoscopy (colonoscopy or flexible sigmoidoscopy):  Excessive amounts of blood in the stool  Significant tenderness or worsening of abdominal pains  Swelling of the abdomen that is new, acute  Fever of 100F or higher  FOLLOW UP: If any biopsies were taken you will be contacted by phone or by letter within the next 1-3 weeks.  Call your gastroenterologist if you have not heard about the biopsies in 3 weeks.  Our staff will call the home number listed on your records the next business day following your procedure to check on you and address any questions or concerns that you may have at that time regarding the information given to you following your procedure. This is a courtesy call and so if there is no answer at the home number and we have not heard from you through the emergency physician on call, we will assume that you have returned to your regular daily activities without incident.  SIGNATURES/CONFIDENTIALITY: You and/or your care partner have signed paperwork which will be entered into your electronic medical record.  These signatures attest to the fact that that the information above on your After Visit Summary has been reviewed and is understood.  Full responsibility of  the confidentiality of this discharge information lies with you and/or your care-partner.  Restart Eliquis tomorrow, Friday 08-13-13   Handout on polyps, diverticulosis, high fiber diet, hemorrhoids

## 2013-08-12 NOTE — Progress Notes (Signed)
Called to room to assist during endoscopic procedure.  Patient ID and intended procedure confirmed with present staff. Received instructions for my participation in the procedure from the performing physician.  

## 2013-08-12 NOTE — Op Note (Signed)
[facility] [address] [place], [address]   COLONOSCOPY PROCEDURE REPORT  PATIENT: Phillip [name], Phillip [name]  MR#: [id number] BIRTHDATE: 09-May-1929 , 84  yrs. old GENDER: Male ENDOSCOPIST: Beverley Fiedler, MD REFERRED QM:VHQION Chilton Si, M.D. PROCEDURE DATE:  08/12/2013 PROCEDURE:   Colonoscopy with snare polypectomy First Screening Colonoscopy - Avg.  risk and is 50 yrs.  old or older - No.  Prior Negative Screening - Now for repeat screening. N/A  History of Adenoma - Now for follow-up colonoscopy & has been > or = to 3 yrs.  Yes hx of adenoma.  Has been 3 or more years since last colonoscopy.  Polyps Removed Today? Yes. ASA CLASS:   Class III INDICATIONS:Patient's personal history of adenomatous colon polyps and Rectal Bleeding. MEDICATIONS: MAC sedation, administered by CRNA and propofol (Diprivan) 200mg  IV  DESCRIPTION OF PROCEDURE:   After the risks benefits and alternatives of the procedure were thoroughly explained, informed consent was obtained.  A digital rectal exam revealed internal hemorrhoids.   The [id number]  endoscope was introduced through the anus and advanced to the cecum, which was identified by both the appendix and ileocecal valve. No adverse events experienced.   The quality of the prep was good, using MoviPrep The instrument was then slowly withdrawn as the colon was fully examined.  COLON FINDINGS: Three sessile polyps measuring 4-5 mm in size were found in the ascending colon and transverse colon.  Polypectomy was performed using cold snare.  All resections were complete and all polyp tissue was completely retrieved.   There was moderate diverticulosis noted in the descending colon and sigmoid colon with associated muscular hypertrophy.   Retroflexed views revealed internal hemorrhoids. The time to cecum=2 minutes 43 seconds. Withdrawal time=12 minutes 01 seconds.  The scope was withdrawn and the procedure completed. COMPLICATIONS: There  were no complications.  ENDOSCOPIC IMPRESSION: 1.   Three sessile polyps measuring 4-5 mm in size were found in the ascending colon and transverse colon; Polypectomy was performed using cold snare 2.   There was moderate diverticulosis noted in the descending colon and sigmoid colon 3.   Moderate sized internal hemorrhoids  RECOMMENDATIONS: 1.  Await pathology results 2.  Can resume Eliquis tomorrow 3.  Given your age, you likely will not need another colonoscopy for colon cancer screening or polyp surveillance.  These types of tests usually stop around the age 94.  This decision can be addressed again in 3 years depending on your overall health at that time 4.  You will receive a letter within 1-2 weeks with the results of your biopsy as well as final recommendations.  Please call my office if you have not received a letter after 3 weeks eSigned:  Beverley Fiedler, MD 08/12/2013 3:17 PM   cc: The Patient and Murray Hodgkins, MD   PATIENT NAME:  [name], [name] MR#: [id number]

## 2013-08-12 NOTE — Progress Notes (Signed)
Report to pacu rn, vss, bbs=clear 

## 2013-08-13 ENCOUNTER — Telehealth: Payer: Self-pay | Admitting: *Deleted

## 2013-08-13 NOTE — Telephone Encounter (Signed)
  Follow up Call-  Call back number 08/12/2013  Post procedure Call Back phone  # [phone]  Permission to leave phone message Yes     Patient questions:  Do you have a fever, pain , or abdominal swelling? no Pain Score  0 *  Have you tolerated food without any problems? yes  Have you been able to return to your normal activities? yes  Do you have any questions about your discharge instructions: Diet   no Medications  no Follow up visit  no  Do you have questions or concerns about your Care? no  Actions: * If pain score is 4 or above: No action needed, pain <4.

## 2013-08-19 ENCOUNTER — Encounter: Payer: Self-pay | Admitting: Internal Medicine

## 2013-09-16 DIAGNOSIS — Z23 Encounter for immunization: Secondary | ICD-10-CM | POA: Diagnosis not present

## 2013-10-13 ENCOUNTER — Other Ambulatory Visit: Payer: Self-pay | Admitting: Internal Medicine

## 2013-10-22 ENCOUNTER — Other Ambulatory Visit: Payer: Self-pay | Admitting: Internal Medicine

## 2013-11-27 ENCOUNTER — Other Ambulatory Visit: Payer: Self-pay | Admitting: Internal Medicine

## 2013-12-03 DIAGNOSIS — L578 Other skin changes due to chronic exposure to nonionizing radiation: Secondary | ICD-10-CM | POA: Diagnosis not present

## 2013-12-03 DIAGNOSIS — L821 Other seborrheic keratosis: Secondary | ICD-10-CM | POA: Diagnosis not present

## 2013-12-03 DIAGNOSIS — L570 Actinic keratosis: Secondary | ICD-10-CM | POA: Diagnosis not present

## 2013-12-03 DIAGNOSIS — Z85828 Personal history of other malignant neoplasm of skin: Secondary | ICD-10-CM | POA: Diagnosis not present

## 2013-12-03 DIAGNOSIS — L738 Other specified follicular disorders: Secondary | ICD-10-CM | POA: Diagnosis not present

## 2013-12-20 ENCOUNTER — Other Ambulatory Visit: Payer: Self-pay | Admitting: Internal Medicine

## 2013-12-20 DIAGNOSIS — N401 Enlarged prostate with lower urinary tract symptoms: Secondary | ICD-10-CM | POA: Diagnosis not present

## 2013-12-20 DIAGNOSIS — E785 Hyperlipidemia, unspecified: Secondary | ICD-10-CM | POA: Diagnosis not present

## 2013-12-20 DIAGNOSIS — R972 Elevated prostate specific antigen [PSA]: Secondary | ICD-10-CM | POA: Diagnosis not present

## 2013-12-20 DIAGNOSIS — I10 Essential (primary) hypertension: Secondary | ICD-10-CM | POA: Diagnosis not present

## 2013-12-20 DIAGNOSIS — N138 Other obstructive and reflux uropathy: Secondary | ICD-10-CM | POA: Diagnosis not present

## 2013-12-20 LAB — LIPID PANEL
CHOLESTEROL: 128 mg/dL (ref 0–200)
HDL: 39 mg/dL (ref 35–70)
LDL Cholesterol: 59 mg/dL
LDl/HDL Ratio: 3.3
Triglycerides: 151 mg/dL (ref 40–160)

## 2013-12-20 LAB — BASIC METABOLIC PANEL
BUN: 13 mg/dL (ref 4–21)
Creatinine: 1.1 mg/dL (ref 0.6–1.3)
GLUCOSE: 99 mg/dL
Potassium: 4 mmol/L (ref 3.4–5.3)
SODIUM: 143 mmol/L (ref 137–147)

## 2013-12-21 ENCOUNTER — Encounter: Payer: Self-pay | Admitting: Internal Medicine

## 2013-12-21 ENCOUNTER — Non-Acute Institutional Stay: Payer: Medicare Other | Admitting: Internal Medicine

## 2013-12-21 VITALS — BP 128/84 | HR 64 | Wt 188.0 lb

## 2013-12-21 DIAGNOSIS — M25569 Pain in unspecified knee: Secondary | ICD-10-CM

## 2013-12-21 DIAGNOSIS — H9190 Unspecified hearing loss, unspecified ear: Secondary | ICD-10-CM | POA: Insufficient documentation

## 2013-12-21 DIAGNOSIS — K648 Other hemorrhoids: Secondary | ICD-10-CM

## 2013-12-21 DIAGNOSIS — H9319 Tinnitus, unspecified ear: Secondary | ICD-10-CM | POA: Insufficient documentation

## 2013-12-21 DIAGNOSIS — R972 Elevated prostate specific antigen [PSA]: Secondary | ICD-10-CM | POA: Diagnosis not present

## 2013-12-21 DIAGNOSIS — Z800 Family history of malignant neoplasm of digestive organs: Secondary | ICD-10-CM

## 2013-12-21 DIAGNOSIS — H9313 Tinnitus, bilateral: Secondary | ICD-10-CM

## 2013-12-21 DIAGNOSIS — Z86010 Personal history of colonic polyps: Secondary | ICD-10-CM | POA: Diagnosis not present

## 2013-12-21 DIAGNOSIS — I10 Essential (primary) hypertension: Secondary | ICD-10-CM | POA: Diagnosis not present

## 2013-12-21 DIAGNOSIS — H9193 Unspecified hearing loss, bilateral: Secondary | ICD-10-CM

## 2013-12-21 DIAGNOSIS — I4891 Unspecified atrial fibrillation: Secondary | ICD-10-CM

## 2013-12-21 DIAGNOSIS — M25562 Pain in left knee: Secondary | ICD-10-CM

## 2013-12-21 HISTORY — DX: Tinnitus, unspecified ear: H93.19

## 2013-12-21 HISTORY — DX: Unspecified hearing loss, unspecified ear: H91.90

## 2013-12-21 NOTE — Progress Notes (Signed)
Patient ID: Phillip [name], male   DOB: 31-Mar-1929, 80 y.o.   MRN: [id number]    Location:  [facility]   Place of Service: Clinic (12)    Allergies  Allergen Reactions  . Ciprofloxacin   . Sulfa Antibiotics   . Zestril [Lisinopril]     Chief Complaint  Patient presents with  . Medical Managment of Chronic Issues    blood pressure, cholesterol, A-Fib    HPI:  Saw ortho. For painful left knee. Given a brace with Velcro closure and metal struts to wear when he plays tennis.   Continues to see Dr. Jarome Matin for removal of a variety of skin lesions, mostly AK and SK.  Chronic low back pain. Mornings are worse.   Intermittent insomnia.  hearinghas been bad and seems to be worsening. Has tried hearing aids, but did not toleerate the amplification of background noises.  Tinnitus persists.   Medications: Patient's Medications  New Prescriptions   No medications on file  Previous Medications   AMLODIPINE (NORVASC) 2.5 MG TABLET    Take 2.5 mg by mouth daily. To control blood pressure   B COMPLEX-C (SUPER B COMPLEX PO)    Take by mouth. Take one tablet daily   CYANOCOBALAMIN 1000 MCG TABLET    Take 100 mcg by mouth daily.   ELIQUIS 5 MG TABS TABLET    TAKE ONE TABLET TWICE DAILY FOR ANTICOAGULATION.   FINASTERIDE (PROSCAR) 5 MG TABLET    TAKE 1 TABLET ONCE DAILY FOR URINATING DRIBBLING/PROSTATE ENLARGEMENT   METOPROLOL SUCCINATE (TOPROL-XL) 50 MG 24 HR TABLET    Take 50 mg by mouth. Take one tablet twice daily for blood pressure Take with or immediately following a meal.   MULTIPLE VITAMIN (ONE-A-DAY MENS PO)    Take by mouth. Take one tablet daily   NAPROXEN SODIUM (ANAPROX) 220 MG TABLET    Take 220 mg by mouth. Take one up to twice daily for arthritis as needed   NEXIUM 40 MG CAPSULE    TAKE 1 CAPSULE DAILY TO REDUCE STOMACH ACID   SIMVASTATIN (ZOCOR) 10 MG TABLET    Take 10 mg by mouth at bedtime. To control cholesterol   TERAZOSIN (HYTRIN) 2 MG CAPSULE    TAKE 1  CAPSULE DAILY   VALSARTAN (DIOVAN) 320 MG TABLET    Take 320 mg by mouth daily. To control blood pressure   VITAMIN A-VITAMIN D-MINERALS (SUPER D3 COMPLEX PO)    Take by mouth. Take one tablet daily  Modified Medications   No medications on file  Discontinued Medications   No medications on file     Review of Systems  Constitutional: Negative for activity change, appetite change and fatigue.  HENT: Positive for hearing loss. Negative for congestion and ear pain.   Eyes: Positive for visual disturbance (corrective lenses).  Cardiovascular: Negative for chest pain, palpitations and leg swelling.       Hx PAF  Gastrointestinal: Positive for constipation. Negative for nausea, abdominal pain, diarrhea, blood in stool, abdominal distention, anal bleeding and rectal pain.       Hemorrhoids  Endocrine: Negative.   Genitourinary: Positive for frequency. Negative for urgency, flank pain, decreased urine volume, penile pain and testicular pain.  Musculoskeletal: Positive for arthralgias and back pain. Negative for gait problem, myalgias and neck pain.       Left knee pains  Skin: Negative.   Allergic/Immunologic: Negative.   Neurological: Negative for dizziness, tremors, weakness, light-headedness, numbness and headaches.  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  Psychiatric/Behavioral: Positive for sleep disturbance. Negative for suicidal ideas. The patient is not nervous/anxious and is not hyperactive.     Filed Vitals:   12/21/13 1059  BP: 128/84  Pulse: 64  Weight: 188 lb (85.276 kg)   Physical Exam  Constitutional: He is oriented to person, place, and time. He appears well-developed and well-nourished. No distress.  HENT:  Head: Normocephalic.  Right Ear: External ear normal.  Left Ear: External ear normal.  Nose: Nose normal.  Mouth/Throat: Oropharynx is clear and moist. No oropharyngeal exudate.  Bilateral hearing loss  Eyes: Conjunctivae and EOM are normal. Pupils are equal,  round, and reactive to light.  Neck: No JVD present. No tracheal deviation present. No thyromegaly present.  Cardiovascular: Normal rate, regular rhythm, normal heart sounds and intact distal pulses.  Exam reveals no gallop and no friction rub.   No murmur heard. Pulmonary/Chest: No respiratory distress. He has no rales.  Abdominal: He exhibits no distension and no mass. There is no tenderness.  Musculoskeletal: Normal range of motion. He exhibits no edema and no tenderness.  Neurological: He is alert and oriented to person, place, and time. He has normal reflexes. No cranial nerve deficit. Coordination normal.  Skin: No rash noted. No erythema. No pallor.  Psychiatric: He has a normal mood and affect. His behavior is normal. Judgment and thought content normal.  Mildly obsessive     Labs reviewed: Nursing Home on 12/21/2013  Component Date Value Range Status  . Glucose 12/20/2013 99   Final  . BUN 12/20/2013 13  4 - 21 mg/dL Final  . Creatinine 12/20/2013 1.1  0.6 - 1.3 mg/dL Final  . Potassium 12/20/2013 4.0  3.4 - 5.3 mmol/L Final  . Sodium 12/20/2013 143  137 - 147 mmol/L Final  . LDl/HDL Ratio 12/20/2013 3.3   Final  . Triglycerides 12/20/2013 151  40 - 160 mg/dL Final  . Cholesterol 12/20/2013 128  0 - 200 mg/dL Final  . HDL 12/20/2013 39  35 - 70 mg/dL Final  . LDL Cholesterol 12/20/2013 59   Final  . [facility] Colonoscopy 08/12/2013 Adenomatous polyps  Dr. Hilarie Fredrickson   Final      Assessment/Plan  Unspecified essential hypertension: controlled  Elevated prostate specific antigen (PSA):  Now in normal range  Hx of adenomatous colonic polyps: 3 removed during colonoscopy in Nov 2014  Family history of colon cancer: mother  Atrial fibrillation: PAF, currently in NSR  Internal hemorrhoids without mention of complication: no recent rectal bleeding or pain  Pain in joint, lower leg, left; improved left knee pain  Tinnitus, bilateral: chronic  Hearing deficit, bilateral:  chronic

## 2013-12-24 ENCOUNTER — Other Ambulatory Visit: Payer: Self-pay | Admitting: Internal Medicine

## 2013-12-28 ENCOUNTER — Encounter: Payer: Self-pay | Admitting: Internal Medicine

## 2014-01-05 ENCOUNTER — Encounter: Payer: Self-pay | Admitting: Internal Medicine

## 2014-01-30 ENCOUNTER — Other Ambulatory Visit: Payer: Self-pay | Admitting: Internal Medicine

## 2014-02-10 ENCOUNTER — Other Ambulatory Visit: Payer: Self-pay | Admitting: Internal Medicine

## 2014-06-07 DIAGNOSIS — Z85828 Personal history of other malignant neoplasm of skin: Secondary | ICD-10-CM | POA: Diagnosis not present

## 2014-06-07 DIAGNOSIS — L821 Other seborrheic keratosis: Secondary | ICD-10-CM | POA: Diagnosis not present

## 2014-06-07 DIAGNOSIS — L570 Actinic keratosis: Secondary | ICD-10-CM | POA: Diagnosis not present

## 2014-06-08 ENCOUNTER — Other Ambulatory Visit: Payer: Self-pay | Admitting: Internal Medicine

## 2014-06-24 ENCOUNTER — Other Ambulatory Visit: Payer: Self-pay | Admitting: Internal Medicine

## 2014-06-27 DIAGNOSIS — E785 Hyperlipidemia, unspecified: Secondary | ICD-10-CM | POA: Diagnosis not present

## 2014-06-27 DIAGNOSIS — I10 Essential (primary) hypertension: Secondary | ICD-10-CM | POA: Diagnosis not present

## 2014-06-27 LAB — LIPID PANEL
CHOLESTEROL: 148 mg/dL (ref 0–200)
HDL: 41 mg/dL (ref 35–70)
LDL CALC: 69 mg/dL
TRIGLYCERIDES: 189 mg/dL — AB (ref 40–160)

## 2014-06-27 LAB — HEPATIC FUNCTION PANEL
ALK PHOS: 98 U/L (ref 25–125)
ALT: 12 U/L (ref 10–40)
AST: 20 U/L (ref 14–40)
Bilirubin, Total: 0.4 mg/dL

## 2014-06-27 LAB — BASIC METABOLIC PANEL
BUN: 14 mg/dL (ref 4–21)
CREATININE: 1 mg/dL (ref 0.6–1.3)
Glucose: 98 mg/dL
Potassium: 3.8 mmol/L (ref 3.4–5.3)
Sodium: 140 mmol/L (ref 137–147)

## 2014-07-05 ENCOUNTER — Encounter: Payer: Self-pay | Admitting: Internal Medicine

## 2014-07-05 ENCOUNTER — Non-Acute Institutional Stay: Payer: Medicare Other | Admitting: Internal Medicine

## 2014-07-05 VITALS — BP 138/88 | HR 60 | Temp 97.8°F | Ht 66.5 in | Wt 188.0 lb

## 2014-07-05 DIAGNOSIS — E785 Hyperlipidemia, unspecified: Secondary | ICD-10-CM

## 2014-07-05 DIAGNOSIS — D126 Benign neoplasm of colon, unspecified: Secondary | ICD-10-CM

## 2014-07-05 DIAGNOSIS — I4891 Unspecified atrial fibrillation: Secondary | ICD-10-CM

## 2014-07-05 DIAGNOSIS — R202 Paresthesia of skin: Secondary | ICD-10-CM | POA: Insufficient documentation

## 2014-07-05 DIAGNOSIS — I10 Essential (primary) hypertension: Secondary | ICD-10-CM | POA: Diagnosis not present

## 2014-07-05 DIAGNOSIS — I480 Paroxysmal atrial fibrillation: Secondary | ICD-10-CM

## 2014-07-05 DIAGNOSIS — K648 Other hemorrhoids: Secondary | ICD-10-CM

## 2014-07-05 DIAGNOSIS — R972 Elevated prostate specific antigen [PSA]: Secondary | ICD-10-CM

## 2014-07-05 DIAGNOSIS — R209 Unspecified disturbances of skin sensation: Secondary | ICD-10-CM

## 2014-07-05 DIAGNOSIS — K219 Gastro-esophageal reflux disease without esophagitis: Secondary | ICD-10-CM

## 2014-07-05 HISTORY — DX: Paresthesia of skin: R20.2

## 2014-07-05 NOTE — Progress Notes (Signed)
Patient ID: Dagoberto Nealy, male   DOB: May 10, 1929, 80 y.o.   MRN: [id number]    Location:  [facility] Service: [facility] (12)  Extended Emergency Contact Information Primary Emergency Contact: Lewison,Anne Address: [address]          [place], [place] [address] [place] Phone: [phone] Relation: Spouse   Chief Complaint  Patient presents with  . Medical Management of Chronic Issues    Comprehensive exam  blood pressure, A-Fib, cholesterol  . Numbness    left arm after sleeping, occasionally for 6 months  . Medication Management    wants to know if he is going to continue on all the medications he is on. Has stopped a lot of the vitamin supplements, only on  [employer]  . Exercise    plays tennis, goes to pool, table tennis all 2-3 times a week, is this all ok, wants to make sure he's not harming himself    HPI:  Paresthesia left arm. Occasional. Usually after waking in the morning.  Paroxysmal atrial fibrillation: Currently in normal sinus rhythm. Anticoagulated with Eliquis.  Other and unspecified hyperlipidemia: Controlled  Unspecified essential hypertension: Controlled  Elevated prostate specific antigen (PSA): Was not checked this visit. Likely related to BPH.  Gastroesophageal reflux disease without esophagitis: Using Nexium about every other day.      Past Medical History  Diagnosis Date  . Osteoarthrosis, unspecified whether generalized or localized, unspecified site 11/24/2012  . Pain in joint, forearm 11/24/2012  . Keratosis, actinic 01/28/2012  . Disturbance of skin sensation 01/28/2012  . Degeneration of lumbar or lumbosacral intervertebral disc 09/24/2011  . Sacroiliitis, not elsewhere classified 05/28/2011  . Scoliosis (and kyphoscoliosis), idiopathic 05/28/2011  . 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 Basal cell carcinoma of other specified sites of skin  4/10/012  . Basal cell carcinoma of skin, site unspecified 03/25/2012  . Benign neoplasm of colon 03/25/2012    adenomatous polyps  . Hemangioma of unspecified site 03/26/2011  . Other and unspecified hyperlipidemia 03/26/2011  . Unspecified essential hypertension 03/26/2011  . Atrial fibrillation 03/26/2011  . Reflux esophagitis 03/26/2011  . Hypertrophy of prostate with urinary obstruction and other lower urinary tract symptoms (LUTS) 03/26/2011  . Ingrowing nail 03/26/2011  . Lumbago 03/26/2011  . Other malaise and fatigue 03/26/2011  . Dysphagia, unspecified(787.20) 03/26/2011  . Elevated prostate specific antigen (PSA) 03/26/2011  . Exposure to unspecified radiation 03/26/2011  . 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 Long term (current) use of anticoagulants 03/26/2011  . 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 Pain in joint, lower leg 06/15/13    left knee  . Internal hemorrhoids without mention of complication 07/24/20  . Rectal bleed 06/15/13  . Diverticulosis   . Colon polyp     Past Surgical History  Procedure Laterality Date  . Squamous cell carcinoma excision Left 2010    hand  . Cryotherapy Left 2009    hand  . Colonoscopy  08/12/2013    Dr. Hilarie Fredrickson adenomatous polyps   Procedures: 1997 colonoscopy: 4 polyps removed, no cancer. 1998 colonoscopy showed no polyps  2001 colonoscopy showing normal 2003-Cystoscopies-Dr. Danneberger and Dr. Hoy Finlay 2003-Renal Sonogram 2006-Prostate Biopsy-Dr. Zachery Conch  2006 EGD 2006 colonoscopy: Normal 2007 CT scan 2007-Cystoscopies 2009-Skin Biopsy and cryotherapy.  06/06/09 CT scan of abdomen: Small left renal mass is unchanged and likely benign 11/12/2010 Colonoscopy-polyps done in [place]  05/30/19 MRI lumbar spine:Severe degenerative disc disease and spondylosis at L4-5 and L5-S1.Moderate degenerative disc disease and spondylosis at L2-3 and  L3-4.  Facet degenerative changes involving the lower lumbar spine.  05/21/12 Renal Ultrasound:1.  No hydronephrosis. 2.  Bilateral renal cysts.  No solid renal lesion is seen.  3.  Prominent prostate.    Consultants:             GI-Dr. Hilarie Fredrickson Dermatologist- Dr. Jarome Matin Cardiologist-Dr. Ardis Hughs Neurologist-Dr. Rebeca Allegra  Urologist-Dr. Kathyrn Drown    History   Social History  . Marital Status: Married    Spouse Name: N/A    Number of Children: N/A  . Years of Education: N/A   Occupational History  . 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 Smoking status: Never Smoker   . Smokeless tobacco: Never Used  . Alcohol Use: No  . Drug Use: No  . Sexual Activity: Not on file   Other Topics Concern  . Not on file   Social History Narrative   Lives at [facility] since 11/2010   Married - [employer]   Does not have Living Will   Never smoked   Alcohol none   Exercise: tennis, pool, table tennis 2-3 times a week     reports that he has never smoked. He has never used smokeless tobacco. He reports that he does not drink alcohol or use illicit drugs.  Immunization History  Administered Date(s) Administered  . Influenza Whole 09/15/2012, 09/16/2013  . PPD Test 11/27/2010  . Pneumococcal Polysaccharide-23 01/17/1996  . Td 02/14/2006    Allergies  Allergen Reactions  . Ciprofloxacin   . Sulfa Antibiotics   . Zestril [Lisinopril]     Medications: Patient's Medications  New Prescriptions   No medications on file  Previous Medications   AMLODIPINE (NORVASC) 2.5 MG TABLET    TAKE 1 TABLET DAILY TO CONTROL BLOOD PRESSURE   DIOVAN 320 MG TABLET    TAKE 1 TABLET DAILY TO CONTROL BLOOD PRESSURE   ELIQUIS 5 MG TABS TABLET    TAKE ONE TABLET TWICE DAILY FOR ANTICOAGULATION.   FINASTERIDE (PROSCAR) 5 MG TABLET    TAKE 1 TABLET ONCE DAILY FOR URINATING DRIBBLING / PROSTATE ENLARGEMENT.   METOPROLOL SUCCINATE (TOPROL-XL) 100 MG  24 HR TABLET    TAKE ONE-HALF (1/2) TABLET TWICE A DAY TO CONTROL HEART RHYTHM.   METOPROLOL SUCCINATE (TOPROL-XL) 50 MG 24 HR TABLET    Take 50 mg by mouth. Take one tablet twice daily for blood pressure Take with or immediately following a meal.   MULTIPLE VITAMIN (ONE-A-DAY MENS PO)    Take by mouth. Take one tablet daily   NAPROXEN SODIUM (ANAPROX) 220 MG TABLET    Take 220 mg by mouth. Take one up to twice daily for arthritis as needed   NEXIUM 40 MG CAPSULE    TAKE 1 CAPSULE DAILY TO REDUCE STOMACH ACID   SIMVASTATIN (ZOCOR) 10 MG TABLET    TAKE 1 TABLET DAILY TO LOWER CHOLESTEROL   TERAZOSIN (HYTRIN) 2 MG CAPSULE    TAKE 1 CAPSULE DAILY  Modified Medications   No medications on file  Discontinued Medications   B COMPLEX-C (SUPER B COMPLEX PO)    Take by mouth. Take one tablet daily   CYANOCOBALAMIN 1000 MCG TABLET    Take 100  mcg by mouth daily.   VITAMIN A-VITAMIN D-MINERALS (SUPER D3 COMPLEX PO)    Take by mouth. Take one tablet daily     Review of Systems  Constitutional: Negative for activity change, appetite change and fatigue.  HENT: Positive for hearing loss. Negative for congestion and ear pain.        Tinnitus  Eyes: Positive for visual disturbance (corrective lenses).  Cardiovascular: Negative for chest pain, palpitations and leg swelling.       Hx PAF  Gastrointestinal: Positive for constipation. Negative for nausea, abdominal pain, diarrhea, blood in stool, abdominal distention, anal bleeding and rectal pain.       Hemorrhoids  Endocrine: Negative.   Genitourinary: Positive for frequency. Negative for urgency, flank pain, decreased urine volume, penile pain and testicular pain.  Musculoskeletal: Positive for arthralgias and back pain. Negative for gait problem, myalgias and neck pain.       Left knee pains Chronic back pains  Skin: Negative.   Allergic/Immunologic: Negative.   Neurological: Negative for dizziness, tremors, weakness, light-headedness, numbness and  headaches.       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  Psychiatric/Behavioral: Positive for sleep disturbance. Negative for suicidal ideas. The patient is not nervous/anxious and is not hyperactive.     Filed Vitals:   07/05/14 1034  BP: 138/88  Pulse: 60  Temp: 97.8 F (36.6 C)  TempSrc: Oral  Height: 5' 6.5" (1.689 m)  Weight: 188 lb (85.276 kg)  SpO2: 96%   Body mass index is 29.89 kg/(m^2).  Physical Exam  Constitutional: He is oriented to person, place, and time. He appears well-developed and well-nourished. No distress.  HENT:  Head: Normocephalic.  Right Ear: External ear normal.  Left Ear: External ear normal.  Nose: Nose normal.  Mouth/Throat: Oropharynx is clear and moist. No oropharyngeal exudate.  Bilateral hearing loss  Eyes: Conjunctivae and EOM are normal. Pupils are equal, round, and reactive to light.  Neck: No JVD present. No tracheal deviation present. No thyromegaly present.  Cardiovascular: Normal rate, regular rhythm, normal heart sounds and intact distal pulses.  Exam reveals no gallop and no friction rub.   No murmur heard. Normal pulses in left arm.  Pulmonary/Chest: No respiratory distress. He has no rales.  Abdominal: He exhibits no distension and no mass. There is no tenderness.  Genitourinary: Rectum normal, prostate normal and penis normal. Guaiac negative stool. No penile tenderness.  Prostate slightly enlarged. No nodules.  Musculoskeletal: Normal range of motion. He exhibits no edema and no tenderness.  Chronic back discomfort. Some difficulty lying down and getting up again.  Neurological: He is alert and oriented to person, place, and time. He has normal reflexes. No cranial nerve deficit. Coordination normal.  Skin: No rash noted. No erythema. No pallor.  Psychiatric: He has a normal mood and affect. His behavior is normal. Judgment and thought content normal.  Mildly obsessive     Labs reviewed: Nursing Home on 07/05/2014  Component  Date Value Ref Range Status  . Glucose 06/27/2014 98   Final  . BUN 06/27/2014 14  4 - 21 mg/dL Final  . Creatinine 06/27/2014 1.0  0.6 - 1.3 mg/dL Final  . Potassium 06/27/2014 3.8  3.4 - 5.3 mmol/L Final  . Sodium 06/27/2014 140  137 - 147 mmol/L Final  . Triglycerides 06/27/2014 189* 40 - 160 mg/dL Final  . Cholesterol 06/27/2014 148  0 - 200 mg/dL Final  . HDL 06/27/2014 41  35 - 70 mg/dL Final  .  LDL Cholesterol 06/27/2014 69   Final  . Alkaline Phosphatase 06/27/2014 98  25 - 125 U/L Final  . ALT 06/27/2014 12  10 - 40 U/L Final  . AST 06/27/2014 20  14 - 40 U/L Final  . Bilirubin, Total 06/27/2014 0.4   Final      Assessment/Plan 1. Paroxysmal atrial fibrillation Currently in normal sinus rhythm. Anticoagulated with Eliquis.  2. Other and unspecified hyperlipidemia Controlled  3. Unspecified essential hypertension Controlled. Patient is on essentially for drug therapy for his blood pressure. Not sure he still needs amlodipine. After discussion, patient prefers to continue on all his current medications: Amlodipine, Diovan, metoprolol, and Hytrin.  4. Elevated prostate specific antigen (PSA) Recheck at future visit  5. Gastroesophageal reflux disease without esophagitis Asymptomatic when taking Nexium  6. Paresthesia of left arm This is intermittent. It is not associated with much discomfort. If it persists or gets worse, MRI of the neck would be justified. At this time we will just observe it.  7. Benign neoplasm of colon Polyps removed in 2014 colonoscopy by Dr. Hilarie Fredrickson.  8. Internal hemorrhoids without mention of complication Occasional irritation. No further bleeding.

## 2014-07-11 ENCOUNTER — Encounter: Payer: Self-pay | Admitting: Internal Medicine

## 2014-08-18 ENCOUNTER — Other Ambulatory Visit: Payer: Self-pay | Admitting: Internal Medicine

## 2014-08-19 ENCOUNTER — Other Ambulatory Visit: Payer: Self-pay | Admitting: Internal Medicine

## 2014-09-04 ENCOUNTER — Other Ambulatory Visit: Payer: Self-pay | Admitting: Internal Medicine

## 2014-09-26 DIAGNOSIS — L570 Actinic keratosis: Secondary | ICD-10-CM | POA: Diagnosis not present

## 2014-09-26 HISTORY — PX: PHOTODYNAMIC THERAPY: SHX862

## 2014-10-01 DIAGNOSIS — Z23 Encounter for immunization: Secondary | ICD-10-CM | POA: Diagnosis not present

## 2014-10-30 ENCOUNTER — Other Ambulatory Visit: Payer: Self-pay | Admitting: Internal Medicine

## 2014-11-12 ENCOUNTER — Other Ambulatory Visit: Payer: Self-pay | Admitting: Internal Medicine

## 2014-11-22 ENCOUNTER — Encounter: Payer: Self-pay | Admitting: Internal Medicine

## 2014-11-22 ENCOUNTER — Non-Acute Institutional Stay: Payer: Medicare Other | Admitting: Internal Medicine

## 2014-11-22 VITALS — BP 160/102 | HR 60 | Temp 98.3°F | Wt 190.0 lb

## 2014-11-22 DIAGNOSIS — M25511 Pain in right shoulder: Secondary | ICD-10-CM

## 2014-11-22 DIAGNOSIS — I10 Essential (primary) hypertension: Secondary | ICD-10-CM | POA: Diagnosis not present

## 2014-11-22 DIAGNOSIS — K219 Gastro-esophageal reflux disease without esophagitis: Secondary | ICD-10-CM | POA: Diagnosis not present

## 2014-11-22 DIAGNOSIS — M542 Cervicalgia: Secondary | ICD-10-CM

## 2014-11-22 NOTE — Progress Notes (Signed)
Patient ID: Phillip [name], male   DOB: 08/30/1929, 80 y.o.   MRN: [id number]    [facility]    Place of Service: Clinic (12)   Allergies  Allergen Reactions  . Ciprofloxacin   . Sulfa Antibiotics   . Zestril [Lisinopril]     Chief Complaint  Patient presents with  . Shoulder Pain    right shoulder pain started 11/30 moved to neck and into left shoulder. Using Aleve and [place] rub.     HPI:  Pain in right shoulder: Discomfort in the right shoulder. He is unable to recall any injury or overuse times. Pain is actually doing better now than they seem to be over the last couple of weeks. He is not doing any particular exercises. Some of the pain seemed to migrate into the neck and over to the left shoulder. Shoulders are never hot or swollen. He has some difficulty sleeping on the right side because of the shoulder discomfort.  Neck pain: Began occurring after the right shoulder became uncomfortable. Denies any tingling paresthesias, water running down the arm, electric shocks, or other neurologic symptoms.  Gastroesophageal reflux disease without esophagitis: Occasional reflux with heartburn    Medications: Patient's Medications  New Prescriptions   No medications on file  Previous Medications   AMLODIPINE (NORVASC) 2.5 MG TABLET    TAKE 1 TABLET DAILY TO CONTROL BLOOD PRESSURE   DIOVAN 320 MG TABLET    TAKE 1 TABLET DAILY TO CONTROL BLOOD PRESSURE   ELIQUIS 5 MG TABS TABLET    TAKE ONE TABLET TWICE DAILY FOR ANTICOAGULATION.   FINASTERIDE (PROSCAR) 5 MG TABLET    TAKE 1 TABLET ONCE DAILY FOR URINATING DRIBBLING/PROSTATE ENLARGEMENT   METOPROLOL SUCCINATE (TOPROL-XL) 50 MG 24 HR TABLET    Take 50 mg by mouth. Take one tablet twice daily for blood pressure Take with or immediately following a meal.   NAPROXEN SODIUM (ANAPROX) 220 MG TABLET    Take 220 mg by mouth. Take one up to twice daily for arthritis as needed   NEXIUM 40 MG CAPSULE    TAKE 1 CAPSULE DAILY TO  REDUCE STOMACH ACID   SIMVASTATIN (ZOCOR) 10 MG TABLET    TAKE 1 TABLET DAILY TO LOWER CHOLESTEROL   TERAZOSIN (HYTRIN) 2 MG CAPSULE    TAKE 1 CAPSULE DAILY  Modified Medications   No medications on file  Discontinued Medications   MULTIPLE VITAMIN (ONE-A-DAY MENS PO)    Take by mouth. Take one tablet daily     Review of Systems  Constitutional: Negative for activity change, appetite change and fatigue.  HENT: Positive for hearing loss. Negative for congestion and ear pain.        Tinnitus  Eyes: Positive for visual disturbance (corrective lenses).  Cardiovascular: Negative for chest pain, palpitations and leg swelling.       Hx PAF  Gastrointestinal: Positive for constipation. Negative for nausea, abdominal pain, diarrhea, blood in stool, abdominal distention, anal bleeding and rectal pain.       Hemorrhoids  Endocrine: Negative.   Genitourinary: Positive for frequency. Negative for urgency, flank pain, decreased urine volume, penile pain and testicular pain.  Musculoskeletal: Positive for back pain and arthralgias. Negative for myalgias, gait problem and neck pain.       Left knee pains Chronic back pains  Skin: Negative.   Allergic/Immunologic: Negative.   Neurological: Negative for dizziness, tremors, weakness, light-headedness, numbness and headaches.       Hx TIA vs transient  global amnesia.  Psychiatric/Behavioral: Positive for sleep disturbance. Negative for suicidal ideas. The patient is not nervous/anxious and is not hyperactive.     Filed Vitals:   11/22/14 0845  BP: 160/102  Pulse: 60  Temp: 98.3 F (36.8 C)  Weight: 190 lb (86.183 kg)   Body mass index is 30.21 kg/(m^2).  Physical Exam  Constitutional: He is oriented to person, place, and time. He appears well-developed and well-nourished. No distress.  HENT:  Head: Normocephalic.  Right Ear: External ear normal.  Left Ear: External ear normal.  Nose: Nose normal.  Mouth/Throat: Oropharynx is clear and  moist. No oropharyngeal exudate.  Bilateral hearing loss  Eyes: Conjunctivae and EOM are normal. Pupils are equal, round, and reactive to light.  Neck: No JVD present. No tracheal deviation present. No thyromegaly present.  Cardiovascular: Normal rate, regular rhythm, normal heart sounds and intact distal pulses.  Exam reveals no gallop and no friction rub.   No murmur heard. Normal pulses in left arm.  Pulmonary/Chest: No respiratory distress. He has no rales.  Abdominal: He exhibits no distension and no mass. There is no tenderness.  Genitourinary: Rectum normal, prostate normal and penis normal. Guaiac negative stool. No penile tenderness.  Prostate slightly enlarged. No nodules.  Musculoskeletal: Normal range of motion. He exhibits no edema or tenderness.  Chronic back discomfort. Some difficulty lying down and getting up again.  Neurological: He is alert and oriented to person, place, and time. He has normal reflexes. No cranial nerve deficit. Coordination normal.  Skin: No rash noted. No erythema. No pallor.  Psychiatric: He has a normal mood and affect. His behavior is normal. Judgment and thought content normal.  Mildly obsessive     Labs reviewed: No visits with results within 3 Month(s) from this visit. Latest known visit with results is:  Nursing Home on 07/05/2014  Component Date Value Ref Range Status  . Glucose 06/27/2014 98   Final  . BUN 06/27/2014 14  4 - 21 mg/dL Final  . Creatinine 06/27/2014 1.0  0.6 - 1.3 mg/dL Final  . Potassium 06/27/2014 3.8  3.4 - 5.3 mmol/L Final  . Sodium 06/27/2014 140  137 - 147 mmol/L Final  . Triglycerides 06/27/2014 189* 40 - 160 mg/dL Final  . Cholesterol 06/27/2014 148  0 - 200 mg/dL Final  . HDL 06/27/2014 41  35 - 70 mg/dL Final  . LDL Cholesterol 06/27/2014 69   Final  . Alkaline Phosphatase 06/27/2014 98  25 - 125 U/L Final  . ALT 06/27/2014 12  10 - 40 U/L Final  . AST 06/27/2014 20  14 - 40 U/L Final  . Bilirubin, Total  06/27/2014 0.4   Final     Assessment/Plan  1. Pain in right shoulder Patient was able to demonstrate full range of motion in both shoulders. He did not seem to be the least restricted in abduction or rotational movements. Unable to palpate any tenderness along the biceps tendon, coracoid process, or subacromial bursa. There is no evidence of weak grip or loss of sensation in the hand or arm.  I don't think anything needs to be done at this time. We did not schedule x-rays or referral to orthopedist, although both of these could be done should the pain persist or get worse.  2. Neck pain Seems mild. Full range of motion of the neck. Unable locate any focally tender spots.  I did not order CT scan of the neck today. We will continue to observe his  symptoms. He will return in 2 months for follow-up.  3. Gastroesophageal reflux disease without esophagitis Occasional, but seems under good control  4. Hypertension: Blood pressure today 160/102. He has a list of blood pressures which are basically under good control when he checks it at home. I did not change medications today. He will continue to keep a list of his blood pressures.

## 2014-11-24 DIAGNOSIS — M542 Cervicalgia: Secondary | ICD-10-CM | POA: Insufficient documentation

## 2014-11-24 DIAGNOSIS — M25511 Pain in right shoulder: Secondary | ICD-10-CM | POA: Insufficient documentation

## 2014-11-24 NOTE — Addendum Note (Signed)
Addended by: Estill Dooms on: 11/24/2014 01:01 PM   Modules accepted: Level of Service

## 2014-12-04 ENCOUNTER — Other Ambulatory Visit: Payer: Self-pay | Admitting: Internal Medicine

## 2014-12-06 ENCOUNTER — Other Ambulatory Visit: Payer: Self-pay | Admitting: Dermatology

## 2014-12-06 DIAGNOSIS — C44719 Basal cell carcinoma of skin of left lower limb, including hip: Secondary | ICD-10-CM | POA: Diagnosis not present

## 2014-12-06 DIAGNOSIS — L821 Other seborrheic keratosis: Secondary | ICD-10-CM | POA: Diagnosis not present

## 2014-12-06 DIAGNOSIS — Z85828 Personal history of other malignant neoplasm of skin: Secondary | ICD-10-CM | POA: Diagnosis not present

## 2014-12-06 DIAGNOSIS — D485 Neoplasm of uncertain behavior of skin: Secondary | ICD-10-CM | POA: Diagnosis not present

## 2014-12-06 DIAGNOSIS — L820 Inflamed seborrheic keratosis: Secondary | ICD-10-CM | POA: Diagnosis not present

## 2014-12-06 DIAGNOSIS — L570 Actinic keratosis: Secondary | ICD-10-CM | POA: Diagnosis not present

## 2014-12-10 HISTORY — PX: BASAL CELL CARCINOMA EXCISION: SHX1214

## 2015-01-02 DIAGNOSIS — E785 Hyperlipidemia, unspecified: Secondary | ICD-10-CM | POA: Diagnosis not present

## 2015-01-02 DIAGNOSIS — I10 Essential (primary) hypertension: Secondary | ICD-10-CM | POA: Diagnosis not present

## 2015-01-02 LAB — BASIC METABOLIC PANEL
BUN: 12 mg/dL (ref 4–21)
Creatinine: 1.1 mg/dL (ref 0.6–1.3)
Glucose: 88 mg/dL
POTASSIUM: 4.2 mmol/L (ref 3.4–5.3)
SODIUM: 142 mmol/L (ref 137–147)

## 2015-01-02 LAB — HEPATIC FUNCTION PANEL
ALK PHOS: 97 U/L (ref 25–125)
ALT: 9 U/L — AB (ref 10–40)
AST: 15 U/L (ref 14–40)
Bilirubin, Total: 0.7 mg/dL

## 2015-01-02 LAB — LIPID PANEL
CHOLESTEROL: 141 mg/dL (ref 0–200)
HDL: 43 mg/dL (ref 35–70)
LDL CALC: 70 mg/dL
LDL/HDL RATIO: 3.3
Triglycerides: 140 mg/dL (ref 40–160)

## 2015-01-10 ENCOUNTER — Encounter: Payer: Self-pay | Admitting: Internal Medicine

## 2015-01-10 ENCOUNTER — Non-Acute Institutional Stay: Payer: Medicare Other | Admitting: Internal Medicine

## 2015-01-10 VITALS — BP 132/74 | HR 64 | Temp 97.5°F | Wt 189.0 lb

## 2015-01-10 DIAGNOSIS — I480 Paroxysmal atrial fibrillation: Secondary | ICD-10-CM

## 2015-01-10 DIAGNOSIS — K219 Gastro-esophageal reflux disease without esophagitis: Secondary | ICD-10-CM | POA: Diagnosis not present

## 2015-01-10 DIAGNOSIS — I10 Essential (primary) hypertension: Secondary | ICD-10-CM | POA: Diagnosis not present

## 2015-01-10 DIAGNOSIS — C44719 Basal cell carcinoma of skin of left lower limb, including hip: Secondary | ICD-10-CM

## 2015-01-10 DIAGNOSIS — E785 Hyperlipidemia, unspecified: Secondary | ICD-10-CM | POA: Diagnosis not present

## 2015-01-10 DIAGNOSIS — M25511 Pain in right shoulder: Secondary | ICD-10-CM

## 2015-01-12 DIAGNOSIS — C44711 Basal cell carcinoma of skin of unspecified lower limb, including hip: Secondary | ICD-10-CM | POA: Insufficient documentation

## 2015-01-12 NOTE — Progress Notes (Signed)
Patient ID: Phillip [name], male   DOB: 04-23-1929, 81 y.o.   MRN: [id number]    [facility]     Place of Service: Clinic (12)    Allergies  Allergen Reactions  . Ciprofloxacin   . Sulfa Antibiotics   . Zestril [Lisinopril]     Chief Complaint  Patient presents with  . Medical Management of Chronic Issues    A-Fib, blood pressure, cholesterol  . Shoulder Pain    bilateral off and on not the same sharpe pain in November.  . Basal Cell Carcinoma    removed from left lower leg, seems to be slow to heal, done 12/10/14.    HPI:  Pain in right shoulder: Having bilateral shoulder pains. Pains are often on. The same to be more dull and not like sharp pain that he reported in the right shoulder in November 2015. He doesn't believe he needs orthopedic consultation or x-rays yet.  Basal cell carcinoma, leg, left: Remote by Dr. Jarome Matin 12/10/14 area has a small scab. It is healing.  Essential hypertension: Controlled  Hyperlipidemia: Under control. Recheck next visit  Paroxysmal atrial fibrillation: Currently in normal sinus rhythm. Remains on Eliquis.  Gastroesophageal reflux disease without esophagitis: Asymptomatic    Medications: Patient's Medications  New Prescriptions   No medications on file  Previous Medications   AMLODIPINE (NORVASC) 2.5 MG TABLET    TAKE 1 TABLET DAILY TO CONTROL BLOOD PRESSURE   DIOVAN 320 MG TABLET    TAKE 1 TABLET DAILY TO CONTROL BLOOD PRESSURE   ELIQUIS 5 MG TABS TABLET    TAKE 1 TABLET TWICE A DAY FOR ANTICOAGULATION   FINASTERIDE (PROSCAR) 5 MG TABLET    TAKE 1 TABLET ONCE DAILY FOR URINATING DRIBBLING/PROSTATE ENLARGEMENT   METOPROLOL SUCCINATE (TOPROL-XL) 50 MG 24 HR TABLET    Take 50 mg by mouth. Take one tablet twice daily for blood pressure Take with or immediately following a meal.   NAPROXEN SODIUM (ANAPROX) 220 MG TABLET    Take 220 mg by mouth. Take one up to twice daily for arthritis as needed   NEXIUM 40 MG CAPSULE     TAKE 1 CAPSULE DAILY TO REDUCE STOMACH ACID   SIMVASTATIN (ZOCOR) 10 MG TABLET    TAKE 1 TABLET DAILY TO LOWER CHOLESTEROL   TERAZOSIN (HYTRIN) 2 MG CAPSULE    TAKE 1 CAPSULE DAILY  Modified Medications   No medications on file  Discontinued Medications   No medications on file     Review of Systems  Constitutional: Negative for activity change, appetite change and fatigue.  HENT: Positive for hearing loss. Negative for congestion and ear pain.        Tinnitus  Eyes: Positive for visual disturbance (corrective lenses).  Cardiovascular: Negative for chest pain, palpitations and leg swelling.       Hx PAF  Gastrointestinal: Positive for constipation. Negative for nausea, abdominal pain, diarrhea, blood in stool, abdominal distention, anal bleeding and rectal pain.       Hemorrhoids  Endocrine: Negative.   Genitourinary: Positive for frequency. Negative for urgency, flank pain, decreased urine volume, penile pain and testicular pain.  Musculoskeletal: Positive for back pain and arthralgias. Negative for myalgias, gait problem and neck pain.       Left knee pains. Chronic back pains. Pain in both shoulders with abduction and rotational movements.  Skin:       Removal of basal cell cancer of the left leg 12/10/14.  Allergic/Immunologic: Negative.  Neurological: Negative for dizziness, tremors, weakness, light-headedness, numbness and headaches.       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  Psychiatric/Behavioral: Positive for sleep disturbance. Negative for suicidal ideas. The patient is not nervous/anxious and is not hyperactive.     Filed Vitals:   01/10/15 0946  BP: 132/74  Pulse: 64  Temp: 97.5 F (36.4 C)  TempSrc: Oral  Weight: 189 lb (85.73 kg)   Body mass index is 30.05 kg/(m^2).  Physical Exam  Constitutional: He is oriented to person, place, and time. He appears well-developed and well-nourished. No distress.  HENT:  Head: Normocephalic.  Right Ear: External ear  normal.  Left Ear: External ear normal.  Nose: Nose normal.  Mouth/Throat: Oropharynx is clear and moist. No oropharyngeal exudate.  Bilateral hearing loss  Eyes: Conjunctivae and EOM are normal. Pupils are equal, round, and reactive to light.  Neck: No JVD present. No tracheal deviation present. No thyromegaly present.  Cardiovascular: Normal rate, regular rhythm, normal heart sounds and intact distal pulses.  Exam reveals no gallop and no friction rub.   No murmur heard. Normal pulses in left arm.  Pulmonary/Chest: No respiratory distress. He has no rales.  Abdominal: He exhibits no distension and no mass. There is no tenderness.  Genitourinary: Rectum normal, prostate normal and penis normal. Guaiac negative stool. No penile tenderness.  Prostate slightly enlarged. No nodules.  Musculoskeletal: Normal range of motion. He exhibits no edema or tenderness.  Chronic back discomfort. Some difficulty lying down and getting up again. Pain in both shoulder areas with rotation and abduction.  Neurological: He is alert and oriented to person, place, and time. He has normal reflexes. No cranial nerve deficit. Coordination normal.  Skin: No rash noted. No erythema. No pallor.  Psychiatric: He has a normal mood and affect. His behavior is normal. Judgment and thought content normal.  Mildly obsessive     Labs reviewed: Nursing Home on 01/10/2015  Component Date Value Ref Range Status  . Glucose 01/02/2015 88   Final  . BUN 01/02/2015 12  4 - 21 mg/dL Final  . Creatinine 01/02/2015 1.1  0.6 - 1.3 mg/dL Final  . Potassium 01/02/2015 4.2  3.4 - 5.3 mmol/L Final  . Sodium 01/02/2015 142  137 - 147 mmol/L Final  . LDl/HDL Ratio 01/02/2015 3.3   Final  . Triglycerides 01/02/2015 140  40 - 160 mg/dL Final  . Cholesterol 01/02/2015 141  0 - 200 mg/dL Final  . HDL 01/02/2015 43  35 - 70 mg/dL Final  . LDL Cholesterol 01/02/2015 70   Final  . Alkaline Phosphatase 01/02/2015 97  25 - 125 U/L Final  .  ALT 01/02/2015 9* 10 - 40 U/L Final  . AST 01/02/2015 15  14 - 40 U/L Final  . Bilirubin, Total 01/02/2015 0.7   Final     Assessment/Plan 1. Pain in right shoulder Continue to monitor. Offered orthopedic consultation, but he does not believe is necessary yet.  2. Basal cell carcinoma, leg, left Area of excision is healing.  3. Essential hypertension Controlled  4. Hyperlipidemia Controlled  5. Paroxysmal atrial fibrillation Currently in normal sinus rhythm. Anticoagulated with Eliquis.  6. Gastroesophageal reflux disease without esophagitis Asymptomatic

## 2015-01-16 ENCOUNTER — Other Ambulatory Visit: Payer: Self-pay | Admitting: Internal Medicine

## 2015-01-24 ENCOUNTER — Encounter: Payer: Self-pay | Admitting: Internal Medicine

## 2015-02-15 ENCOUNTER — Other Ambulatory Visit: Payer: Self-pay | Admitting: Internal Medicine

## 2015-03-28 ENCOUNTER — Other Ambulatory Visit: Payer: Self-pay | Admitting: Internal Medicine

## 2015-04-05 ENCOUNTER — Other Ambulatory Visit: Payer: Self-pay | Admitting: Internal Medicine

## 2015-06-08 DIAGNOSIS — D485 Neoplasm of uncertain behavior of skin: Secondary | ICD-10-CM | POA: Diagnosis not present

## 2015-06-08 DIAGNOSIS — Z85828 Personal history of other malignant neoplasm of skin: Secondary | ICD-10-CM | POA: Diagnosis not present

## 2015-06-08 DIAGNOSIS — L570 Actinic keratosis: Secondary | ICD-10-CM | POA: Diagnosis not present

## 2015-06-08 DIAGNOSIS — C44619 Basal cell carcinoma of skin of left upper limb, including shoulder: Secondary | ICD-10-CM | POA: Diagnosis not present

## 2015-06-08 DIAGNOSIS — L821 Other seborrheic keratosis: Secondary | ICD-10-CM | POA: Diagnosis not present

## 2015-06-08 DIAGNOSIS — L812 Freckles: Secondary | ICD-10-CM | POA: Diagnosis not present

## 2015-06-08 DIAGNOSIS — D1801 Hemangioma of skin and subcutaneous tissue: Secondary | ICD-10-CM | POA: Diagnosis not present

## 2015-06-19 ENCOUNTER — Other Ambulatory Visit: Payer: Self-pay | Admitting: Internal Medicine

## 2015-06-25 ENCOUNTER — Other Ambulatory Visit: Payer: Self-pay | Admitting: Internal Medicine

## 2015-07-03 ENCOUNTER — Other Ambulatory Visit: Payer: Self-pay | Admitting: Internal Medicine

## 2015-07-10 ENCOUNTER — Encounter: Payer: Self-pay | Admitting: *Deleted

## 2015-07-10 DIAGNOSIS — I10 Essential (primary) hypertension: Secondary | ICD-10-CM | POA: Diagnosis not present

## 2015-07-10 DIAGNOSIS — E785 Hyperlipidemia, unspecified: Secondary | ICD-10-CM | POA: Diagnosis not present

## 2015-07-10 LAB — BASIC METABOLIC PANEL
BUN: 12 mg/dL (ref 4–21)
Creatinine: 1.1 mg/dL (ref 0.6–1.3)
Glucose: 89 mg/dL
Potassium: 3.7 mmol/L (ref 3.4–5.3)
SODIUM: 140 mmol/L (ref 137–147)

## 2015-07-10 LAB — LIPID PANEL
CHOLESTEROL: 155 mg/dL (ref 0–200)
HDL: 45 mg/dL (ref 35–70)
LDL Cholesterol: 82 mg/dL
TRIGLYCERIDES: 139 mg/dL (ref 40–160)

## 2015-07-18 ENCOUNTER — Non-Acute Institutional Stay: Payer: Medicare Other | Admitting: Internal Medicine

## 2015-07-18 ENCOUNTER — Encounter: Payer: Self-pay | Admitting: Internal Medicine

## 2015-07-18 VITALS — BP 130/88 | HR 62 | Temp 97.4°F | Resp 20 | Ht 66.5 in | Wt 187.0 lb

## 2015-07-18 DIAGNOSIS — E785 Hyperlipidemia, unspecified: Secondary | ICD-10-CM

## 2015-07-18 DIAGNOSIS — G4700 Insomnia, unspecified: Secondary | ICD-10-CM

## 2015-07-18 DIAGNOSIS — R202 Paresthesia of skin: Secondary | ICD-10-CM

## 2015-07-18 DIAGNOSIS — F418 Other specified anxiety disorders: Secondary | ICD-10-CM | POA: Insufficient documentation

## 2015-07-18 DIAGNOSIS — C44719 Basal cell carcinoma of skin of left lower limb, including hip: Secondary | ICD-10-CM

## 2015-07-18 DIAGNOSIS — M545 Low back pain, unspecified: Secondary | ICD-10-CM

## 2015-07-18 DIAGNOSIS — I10 Essential (primary) hypertension: Secondary | ICD-10-CM

## 2015-07-18 NOTE — Progress Notes (Signed)
Patient ID: Phillip [name], male   DOB: 07/16/1929, 81 y.o.   MRN: [id number]    [facility]     Place of Service: Clinic (12)     Allergies  Allergen Reactions  . Ciprofloxacin   . Sulfa Antibiotics   . Zestril [Lisinopril]     Chief Complaint  Patient presents with  . Medical Management of Chronic Issues    numbness in rt arm and hand(tennis), insomnia, lower back pain,    HPI:  Paresthesia of right arm - present for approximately 6 months off and on. Previously had a problem similar to this in the left arm. Describes the tingling in numbness is going from the hand to the right shoulder. There is also a little tenderness just medial to the right shoulder. He complains of a numb feeling in the right second finger and left middle finger. Patient continues to play tennis. The symptoms seem to improve a little bit when he stopped playing tennis for about 3 weeks.  Basal cell carcinoma, leg, left - removed by Dr. Ronnald Ramp June 2016. Also removed a basal cell cancer of the left elbow in June 2016. Areas have healed well. Patient has some scaling and other problems on the face and neck and will undergo "flulike" therapy soon.  Essential hypertension - controlled  Hyperlipidemia - controlled  Insomnia - for more than 6 months he has had night sweats that he just lays awake for 2 or more hours. Once he falls asleep he is able sleep through the night and may sleep until 10 or 11 AM the next day. The discharge sleep does not seem to be accompanied by ruminations about problems although patient is mildly obsessive in his personality. He denies depression. No physical disturbances they create a sleep disturbance. He has not tried anything so far to help this problem. Some nights he sleeps normally.  Midline low back pain without sciatica    Medications: Patient's Medications  New Prescriptions   No medications on file  Previous Medications   AMLODIPINE (NORVASC) 2.5 MG TABLET     TAKE 1 TABLET DAILY TO CONTROL BLOOD PRESSURE   ELIQUIS 5 MG TABS TABLET    TAKE 1 TABLET TWICE A DAY FOR ANTICOAGULATION   FINASTERIDE (PROSCAR) 5 MG TABLET    TAKE 1 TABLET ONCE DAILY FOR URINATING DRIBBLING/PROSTATE ENLARGEMENT   METOPROLOL SUCCINATE (TOPROL-XL) 50 MG 24 HR TABLET    Take 50 mg by mouth. Take one tablet twice daily for blood pressure Take with or immediately following a meal.   NAPROXEN SODIUM (ANAPROX) 220 MG TABLET    Take 220 mg by mouth. Take one up to twice daily for arthritis as needed   NEXIUM 40 MG CAPSULE    TAKE 1 CAPSULE DAILY TO REDUCE STOMACH ACID   SIMVASTATIN (ZOCOR) 10 MG TABLET    TAKE 1 TABLET DAILY TO LOWER CHOLESTEROL   TERAZOSIN (HYTRIN) 2 MG CAPSULE    TAKE 1 CAPSULE DAILY   VALSARTAN (DIOVAN) 320 MG TABLET    TAKE 1 TABLET DAILY TO CONTROL BLOOD PRESSURE  Modified Medications   No medications on file  Discontinued Medications   No medications on file     Review of Systems  Constitutional: Negative for activity change, appetite change and fatigue.  HENT: Positive for hearing loss. Negative for congestion and ear pain.        Tinnitus  Eyes: Positive for visual disturbance (corrective lenses).  Cardiovascular: Negative for chest pain, palpitations and  leg swelling.       Hx PAF  Gastrointestinal: Positive for constipation. Negative for nausea, abdominal pain, diarrhea, blood in stool, abdominal distention, anal bleeding and rectal pain.       Hemorrhoids  Endocrine: Negative.   Genitourinary: Positive for frequency. Negative for urgency, flank pain, decreased urine volume, penile pain and testicular pain.  Musculoskeletal: Positive for back pain and arthralgias. Negative for myalgias, gait problem and neck pain.       Left knee pains. Chronic back pains. Pain in both shoulders with abduction and rotational movements.  Skin:       Removal of basal cell cancer of the left leg 12/10/14.  Allergic/Immunologic: Negative.   Neurological: Negative for  dizziness, tremors, weakness, light-headedness, numbness and headaches.       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 Paresthesias of the right arm.  Psychiatric/Behavioral: Positive for sleep disturbance (Difficulty falling asleep). Negative for suicidal ideas. The patient is not nervous/anxious and is not hyperactive.     Filed Vitals:   07/18/15 1005  BP: 130/88  Pulse: 62  Temp: 97.4 F (36.3 C)  TempSrc: Oral  Resp: 20  Height: 5' 6.5" (1.689 m)  Weight: 187 lb (84.823 kg)  SpO2: 96%   Body mass index is 29.73 kg/(m^2).  Physical Exam  Constitutional: He is oriented to person, place, and time. He appears well-developed and well-nourished. No distress.  HENT:  Head: Normocephalic.  Right Ear: External ear normal.  Left Ear: External ear normal.  Nose: Nose normal.  Mouth/Throat: Oropharynx is clear and moist. No oropharyngeal exudate.  Bilateral hearing loss  Eyes: Conjunctivae and EOM are normal. Pupils are equal, round, and reactive to light.  Neck: No JVD present. No tracheal deviation present. No thyromegaly present.  Cardiovascular: Normal rate, regular rhythm, normal heart sounds and intact distal pulses.  Exam reveals no gallop and no friction rub.   No murmur heard. Normal pulses in left arm.  Pulmonary/Chest: No respiratory distress. He has no rales.  Abdominal: He exhibits no distension and no mass. There is no tenderness.  Genitourinary: Rectum normal, prostate normal and penis normal. Guaiac negative stool. No penile tenderness.  Prostate slightly enlarged. No nodules.  Musculoskeletal: Normal range of motion. He exhibits no edema or tenderness.  Chronic back discomfort. Some difficulty lying down and getting up again. Pain in both shoulder areas with rotation and abduction.  Neurological: He is alert and oriented to person, place, and time. He has normal reflexes. No cranial nerve deficit. Coordination normal.  Skin: No rash noted. No erythema. No pallor.    Scaling areas on the forehead and scalp as well as along the left neck.  Psychiatric: He has a normal mood and affect. His behavior is normal. Judgment and thought content normal.  Mildly obsessive     Labs reviewed: Abstract on 07/10/2015  Component Date Value Ref Range Status  . Glucose 07/10/2015 89   Final  . BUN 07/10/2015 12  4 - 21 mg/dL Final  . Creatinine 07/10/2015 1.1  0.6 - 1.3 mg/dL Final  . Potassium 07/10/2015 3.7  3.4 - 5.3 mmol/L Final  . Sodium 07/10/2015 140  137 - 147 mmol/L Final  . Triglycerides 07/10/2015 139  40 - 160 mg/dL Final  . Cholesterol 07/10/2015 155  0 - 200 mg/dL Final  . HDL 07/10/2015 45  35 - 70 mg/dL Final  . LDL Cholesterol 07/10/2015 82   Final     Assessment/Plan  1. Paresthesia of right  arm Discussed possible etiologies which would include nerve entrapment, brachial plexus injury, or paresthesias related to compromise of the nerves as they exit the spinal cord  2. Basal cell carcinoma, leg, left Healed  3. Essential hypertension Controlled  4. Hyperlipidemia Controlled  5. Insomnia Try melatonin 5 mg nightly  6. Midline low back pain without sciatica Continue to use Tylenol and Aleve as needed.

## 2015-08-12 ENCOUNTER — Other Ambulatory Visit: Payer: Self-pay | Admitting: Internal Medicine

## 2015-08-29 ENCOUNTER — Other Ambulatory Visit: Payer: Self-pay | Admitting: Internal Medicine

## 2015-09-14 DIAGNOSIS — Z23 Encounter for immunization: Secondary | ICD-10-CM | POA: Diagnosis not present

## 2015-09-16 ENCOUNTER — Other Ambulatory Visit: Payer: Self-pay | Admitting: Internal Medicine

## 2015-09-18 ENCOUNTER — Other Ambulatory Visit: Payer: Self-pay | Admitting: Internal Medicine

## 2015-09-22 DIAGNOSIS — L570 Actinic keratosis: Secondary | ICD-10-CM | POA: Diagnosis not present

## 2015-09-23 ENCOUNTER — Other Ambulatory Visit: Payer: Self-pay | Admitting: Internal Medicine

## 2015-11-30 DIAGNOSIS — D485 Neoplasm of uncertain behavior of skin: Secondary | ICD-10-CM | POA: Diagnosis not present

## 2015-11-30 DIAGNOSIS — D1801 Hemangioma of skin and subcutaneous tissue: Secondary | ICD-10-CM | POA: Diagnosis not present

## 2015-11-30 DIAGNOSIS — L821 Other seborrheic keratosis: Secondary | ICD-10-CM | POA: Diagnosis not present

## 2015-11-30 DIAGNOSIS — L570 Actinic keratosis: Secondary | ICD-10-CM | POA: Diagnosis not present

## 2015-11-30 DIAGNOSIS — Z85828 Personal history of other malignant neoplasm of skin: Secondary | ICD-10-CM | POA: Diagnosis not present

## 2015-12-22 ENCOUNTER — Other Ambulatory Visit: Payer: Self-pay | Admitting: Internal Medicine

## 2016-01-15 DIAGNOSIS — E785 Hyperlipidemia, unspecified: Secondary | ICD-10-CM | POA: Diagnosis not present

## 2016-01-15 DIAGNOSIS — I10 Essential (primary) hypertension: Secondary | ICD-10-CM | POA: Diagnosis not present

## 2016-01-15 LAB — LIPID PANEL
Cholesterol: 147 mg/dL (ref 0–200)
HDL: 44 mg/dL (ref 35–70)
LDL Cholesterol: 75 mg/dL
LDl/HDL Ratio: 3.3
Triglycerides: 141 mg/dL (ref 40–160)

## 2016-01-15 LAB — BASIC METABOLIC PANEL WITH GFR
BUN: 13 mg/dL (ref 4–21)
Creatinine: 1.2 mg/dL (ref <=1.3)
Glucose: 97 mg/dL
Potassium: 4.2 mmol/L (ref 3.4–5.3)
Sodium: 143 mmol/L (ref 137–147)

## 2016-01-15 LAB — HEPATIC FUNCTION PANEL
ALT: 11 U/L (ref 10–40)
AST: 17 U/L (ref 14–40)
Alkaline Phosphatase: 83 U/L (ref 25–125)
Bilirubin, Total: 0.6 mg/dL

## 2016-01-16 ENCOUNTER — Encounter: Payer: Self-pay | Admitting: *Deleted

## 2016-01-23 ENCOUNTER — Encounter: Payer: Self-pay | Admitting: Internal Medicine

## 2016-01-23 ENCOUNTER — Non-Acute Institutional Stay: Payer: Medicare Other | Admitting: Internal Medicine

## 2016-01-23 VITALS — BP 130/78 | HR 64 | Temp 97.4°F | Resp 20 | Ht 67.0 in | Wt 187.6 lb

## 2016-01-23 DIAGNOSIS — H538 Other visual disturbances: Secondary | ICD-10-CM | POA: Insufficient documentation

## 2016-01-23 DIAGNOSIS — I10 Essential (primary) hypertension: Secondary | ICD-10-CM | POA: Diagnosis not present

## 2016-01-23 DIAGNOSIS — M25562 Pain in left knee: Secondary | ICD-10-CM

## 2016-01-23 DIAGNOSIS — M25511 Pain in right shoulder: Secondary | ICD-10-CM | POA: Diagnosis not present

## 2016-01-23 DIAGNOSIS — E785 Hyperlipidemia, unspecified: Secondary | ICD-10-CM

## 2016-01-23 NOTE — Progress Notes (Signed)
Patient ID: Phillip [name], male   DOB: 1929/04/12, 82 y.o.   MRN: [id number]    [facility]     Place of Service: Clinic (12)     Allergies  Allergen Reactions  . Ciprofloxacin   . Sulfa Antibiotics   . Zestril [Lisinopril]     Chief Complaint  Patient presents with  . Medical Management of Chronic Issues    HPI:  Episode of blurred vision in Jan 2017. Both eyes affected. Lasted about 30 min. Had an episode similarl to this about Sept 2016. There was a waviness in the field of vision. No headache.  Some discomfort in the right shoulder playing tennis. Also some discomfort in the lower back and knees.   Medications: Patient's Medications  New Prescriptions   No medications on file  Previous Medications   AMLODIPINE (NORVASC) 2.5 MG TABLET    TAKE 1 TABLET DAILY TO CONTROL BLOOD PRESSURE   ELIQUIS 5 MG TABS TABLET    TAKE 1 TABLET TWICE A DAY FOR ANTICOAGULATION   FINASTERIDE (PROSCAR) 5 MG TABLET    TAKE 1 TABLET ONCE DAILY FOR URINATING DRIBBLING / PROSTATE ENLARGEMENT   METOPROLOL SUCCINATE (TOPROL-XL) 50 MG 24 HR TABLET    TAKE 1 TABLET TWICE A DAY TO CONTROL BLOOD PRESSURE AND HEART RHYTHM   NAPROXEN SODIUM (ANAPROX) 220 MG TABLET    Take 220 mg by mouth. Take one up to twice daily for arthritis as needed   NEXIUM 40 MG CAPSULE    TAKE 1 CAPSULE DAILY TO REDUCE STOMACH ACID   SIMVASTATIN (ZOCOR) 10 MG TABLET    TAKE 1 TABLET DAILY TO LOWER CHOLESTEROL   TERAZOSIN (HYTRIN) 2 MG CAPSULE    TAKE 1 CAPSULE DAILY   VALSARTAN (DIOVAN) 320 MG TABLET    TAKE 1 TABLET DAILY TO CONTROL BLOOD PRESSURE  Modified Medications   No medications on file  Discontinued Medications   No medications on file     Review of Systems  Constitutional: Negative for activity change, appetite change and fatigue.  HENT: Positive for hearing loss. Negative for congestion and ear pain.        Tinnitus  Eyes: Positive for visual disturbance (corrective lenses).  Cardiovascular:  Negative for chest pain, palpitations and leg swelling.       Hx PAF  Gastrointestinal: Positive for constipation. Negative for nausea, abdominal pain, diarrhea, blood in stool, abdominal distention, anal bleeding and rectal pain.       Hemorrhoids  Endocrine: Negative.   Genitourinary: Positive for frequency. Negative for urgency, flank pain, decreased urine volume, penile pain and testicular pain.  Musculoskeletal: Positive for back pain and arthralgias. Negative for myalgias, gait problem and neck pain.       Left knee pains. Chronic back pains. Pain in both shoulders with abduction and rotational movements.  Skin:       Removal of basal cell cancer of the left leg 12/10/14.  Allergic/Immunologic: Negative.   Neurological: Negative for dizziness, tremors, weakness, light-headedness, numbness and headaches.       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 Paresthesias of the right arm.  Psychiatric/Behavioral: Positive for sleep disturbance (Difficulty falling asleep). Negative for suicidal ideas. The patient is not nervous/anxious and is not hyperactive.     Filed Vitals:   01/23/16 0918  BP: 130/78  Pulse: 64  Temp: 97.4 F (36.3 C)  TempSrc: Oral  Resp: 20  Height: 5' 7"  (1.702 m)  Weight: 187 lb 9.6 oz (85.095 kg)  SpO2: 96%   Wt Readings from Last 3 Encounters:  01/23/16 187 lb 9.6 oz (85.095 kg)  07/18/15 187 lb (84.823 kg)  01/10/15 189 lb (85.73 kg)    Body mass index is 29.38 kg/(m^2).  Physical Exam  Constitutional: He is oriented to person, place, and time. He appears well-developed and well-nourished. No distress.  HENT:  Head: Normocephalic.  Right Ear: External ear normal.  Left Ear: External ear normal.  Nose: Nose normal.  Mouth/Throat: Oropharynx is clear and moist. No oropharyngeal exudate.  Bilateral hearing loss  Eyes: Conjunctivae and EOM are normal. Pupils are equal, round, and reactive to light.  Neck: No JVD present. No tracheal deviation present. No  thyromegaly present.  Cardiovascular: Normal rate, regular rhythm, normal heart sounds and intact distal pulses.  Exam reveals no gallop and no friction rub.   No murmur heard. Normal pulses in left arm.  Pulmonary/Chest: No respiratory distress. He has no rales.  Abdominal: He exhibits no distension and no mass. There is no tenderness.  Genitourinary: Rectum normal, prostate normal and penis normal. Guaiac negative stool. No penile tenderness.  Prostate slightly enlarged. No nodules.  Musculoskeletal: Normal range of motion. He exhibits no edema or tenderness.  Chronic back discomfort. Some difficulty lying down and getting up again. Pain in both shoulder areas with rotation and abduction.  Neurological: He is alert and oriented to person, place, and time. He has normal reflexes. No cranial nerve deficit. Coordination normal.  Skin: No rash noted. No erythema. No pallor.  Scaling areas on the forehead and scalp as well as along the left neck.  Psychiatric: He has a normal mood and affect. His behavior is normal. Judgment and thought content normal.  Mildly obsessive     Labs reviewed: Lab Summary Latest Ref Rng 01/15/2016 07/10/2015 01/02/2015 06/27/2014  Hemoglobin - (None) (None) (None) (None)  Hematocrit - (None) (None) (None) (None)  White count - (None) (None) (None) (None)  Platelet count - (None) (None) (None) (None)  Sodium 137 - 147 mmol/L 143 140 142 140  Potassium 3.4 - 5.3 mmol/L 4.2 3.7 4.2 3.8  Calcium - (None) (None) (None) (None)  Phosphorus - (None) (None) (None) (None)  Creatinine .6 - 1.3 mg/dL 1.2 1.1 1.1 1.0  AST 14 - 40 U/L 17 (None) 15 20  Alk Phos 25 - 125 U/L 83 (None) 97 98  Bilirubin - (None) (None) (None) (None)  Glucose - 97 89 88 98  Cholesterol 0 - 200 mg/dL 147 155 141 148  HDL cholesterol 35 - 70 mg/dL 44 45 43 41  Triglycerides 40 - 160 mg/dL 141 139 140 189(A)  LDL Direct - (None) (None) (None) (None)  LDL Calc - 75 82 70 69  Total protein -  (None) (None) (None) (None)  Albumin - (None) (None) (None) (None)   No results found for: TSH Lab Results  Component Value Date   BUN 13 01/15/2016   BUN 12 07/10/2015   BUN 12 01/02/2015   Lab Results  Component Value Date   CREATININE 1.2 01/15/2016   CREATININE 1.1 07/10/2015   CREATININE 1.1 01/02/2015   No results found for: HGBA1C     Assessment/Plan  1. Essential hypertension Controlled  2. Hyperlipidemia Controlled  3. Blurred vision Multiple potential etiologies. Patient has been in atrial fibrillation since being a teenager. He did not experience any significant change in cardiac rhythms during either of the 2 episodes that he discuss today. There is no evidence for seizure. He  does not have carotid bruits or cardiac murmurs. I discussed a variety of potential tests including MRI of the brain, carotid Doppler, 2-D echocardiogram, EEG. He does not feel a necessity to pursue any of these tests at this time and would prefer just to "wait".  4. Pain in right shoulder Unchanged. He does not want orthopedic referral.  5. Arthralgia of left lower leg Unchanged. He does not want orthopedic referral.

## 2016-02-08 ENCOUNTER — Other Ambulatory Visit: Payer: Self-pay | Admitting: Internal Medicine

## 2016-02-09 DIAGNOSIS — Z23 Encounter for immunization: Secondary | ICD-10-CM | POA: Diagnosis not present

## 2016-03-14 ENCOUNTER — Other Ambulatory Visit: Payer: Self-pay | Admitting: Internal Medicine

## 2016-03-19 ENCOUNTER — Other Ambulatory Visit: Payer: Self-pay | Admitting: Internal Medicine

## 2016-05-06 ENCOUNTER — Other Ambulatory Visit: Payer: Self-pay | Admitting: Internal Medicine

## 2016-05-14 ENCOUNTER — Telehealth: Payer: Self-pay

## 2016-05-14 MED ORDER — RANITIDINE HCL 150 MG PO TABS
ORAL_TABLET | ORAL | Status: DC
Start: 2016-05-14 — End: 2017-03-13

## 2016-05-14 NOTE — Telephone Encounter (Signed)
Patient came by [facility] clinic with a letter from [place] that Nexium will not be covered at the same rate starting June 28th 2017, because there are other drugs just as effective that cost less. "Options " Omeprazole capsule; Pantoprazole tablet; Rabeprazole tablet or Aciphex sprinles which would require doctor to also request Prior Authorization.  Called patient Dr. Nyoka Cowden switched to Ranitidine 150 mg twice daily will fax to his mail order pharmacy. He appreciated this.

## 2016-06-10 DIAGNOSIS — Z85828 Personal history of other malignant neoplasm of skin: Secondary | ICD-10-CM | POA: Diagnosis not present

## 2016-06-10 DIAGNOSIS — L820 Inflamed seborrheic keratosis: Secondary | ICD-10-CM | POA: Diagnosis not present

## 2016-06-10 DIAGNOSIS — L853 Xerosis cutis: Secondary | ICD-10-CM | POA: Diagnosis not present

## 2016-06-10 DIAGNOSIS — L821 Other seborrheic keratosis: Secondary | ICD-10-CM | POA: Diagnosis not present

## 2016-06-10 DIAGNOSIS — L570 Actinic keratosis: Secondary | ICD-10-CM | POA: Diagnosis not present

## 2016-06-10 DIAGNOSIS — L578 Other skin changes due to chronic exposure to nonionizing radiation: Secondary | ICD-10-CM | POA: Diagnosis not present

## 2016-06-12 ENCOUNTER — Other Ambulatory Visit: Payer: Self-pay | Admitting: Internal Medicine

## 2016-06-15 ENCOUNTER — Other Ambulatory Visit: Payer: Self-pay | Admitting: Internal Medicine

## 2016-06-17 ENCOUNTER — Other Ambulatory Visit: Payer: Self-pay | Admitting: Internal Medicine

## 2016-06-21 ENCOUNTER — Other Ambulatory Visit: Payer: Self-pay | Admitting: Internal Medicine

## 2016-07-18 ENCOUNTER — Encounter: Payer: Self-pay | Admitting: Internal Medicine

## 2016-07-18 DIAGNOSIS — I10 Essential (primary) hypertension: Secondary | ICD-10-CM | POA: Diagnosis not present

## 2016-07-18 LAB — HEPATIC FUNCTION PANEL
ALK PHOS: 81 U/L (ref 25–125)
ALT: 8 U/L — AB (ref 10–40)
AST: 15 U/L (ref 14–40)
Bilirubin, Total: 0.6 mg/dL

## 2016-07-18 LAB — BASIC METABOLIC PANEL
BUN: 16 mg/dL (ref 4–21)
Creatinine: 1.1 mg/dL (ref 0.6–1.3)
Glucose: 85 mg/dL
Potassium: 3.9 mmol/L (ref 3.4–5.3)
Sodium: 142 mmol/L (ref 137–147)

## 2016-07-18 LAB — LIPID PANEL
CHOLESTEROL: 133 mg/dL (ref 0–200)
HDL: 42 mg/dL (ref 35–70)
LDL Cholesterol: 68 mg/dL
Triglycerides: 117 mg/dL (ref 40–160)

## 2016-07-23 ENCOUNTER — Non-Acute Institutional Stay: Payer: Medicare Other | Admitting: Internal Medicine

## 2016-07-23 ENCOUNTER — Encounter: Payer: Self-pay | Admitting: Internal Medicine

## 2016-07-23 VITALS — BP 148/80 | HR 55 | Temp 97.9°F | Ht 67.0 in | Wt 185.0 lb

## 2016-07-23 DIAGNOSIS — M545 Low back pain, unspecified: Secondary | ICD-10-CM

## 2016-07-23 DIAGNOSIS — Z7901 Long term (current) use of anticoagulants: Secondary | ICD-10-CM

## 2016-07-23 DIAGNOSIS — I10 Essential (primary) hypertension: Secondary | ICD-10-CM | POA: Diagnosis not present

## 2016-07-23 DIAGNOSIS — E785 Hyperlipidemia, unspecified: Secondary | ICD-10-CM

## 2016-07-23 DIAGNOSIS — S91002A Unspecified open wound, left ankle, initial encounter: Secondary | ICD-10-CM

## 2016-07-23 DIAGNOSIS — S81002A Unspecified open wound, left knee, initial encounter: Secondary | ICD-10-CM | POA: Diagnosis not present

## 2016-07-23 DIAGNOSIS — S81802A Unspecified open wound, left lower leg, initial encounter: Secondary | ICD-10-CM | POA: Diagnosis not present

## 2016-07-23 DIAGNOSIS — S81809A Unspecified open wound, unspecified lower leg, initial encounter: Secondary | ICD-10-CM

## 2016-07-23 DIAGNOSIS — G8929 Other chronic pain: Secondary | ICD-10-CM

## 2016-07-23 DIAGNOSIS — S81009A Unspecified open wound, unspecified knee, initial encounter: Secondary | ICD-10-CM | POA: Insufficient documentation

## 2016-07-23 DIAGNOSIS — S91009A Unspecified open wound, unspecified ankle, initial encounter: Secondary | ICD-10-CM

## 2016-07-23 DIAGNOSIS — I480 Paroxysmal atrial fibrillation: Secondary | ICD-10-CM | POA: Diagnosis not present

## 2016-07-23 HISTORY — DX: Long term (current) use of anticoagulants: Z79.01

## 2016-07-23 NOTE — Progress Notes (Signed)
Facility  [facility]    Place of Service: Clinic (12)     Allergies  Allergen Reactions  . Ciprofloxacin   . Sulfa Antibiotics   . Zestril [Lisinopril]     Chief Complaint  Patient presents with  . Medical Management of Chronic Issues    6 month medication management blood pressure, cholesterol  . Back Pain    lower back, in hip area, as before  . Foot Pain    left, hurts after playing tennis.     HPI:  Traumatic injury to the left foreleg when he ran in to a horseshoe stake. Wound is healing.  Essential hypertension - controlled  Hyperlipidemia - controlled  Paroxysmal atrial fibrillation (HCC) - denies palpitations. Hx PAF.  Long term current use of anticoagulant therapy - Eliquis for PAF  Chronic midline low back pain without sciatica - only happens a couple of times in the last year. Had Xrays in 2012 that showed arthritis. Pain was present about 1 week and is now resolved.    Medications: Patient's Medications  New Prescriptions   No medications on file  Previous Medications   AMLODIPINE (NORVASC) 2.5 MG TABLET    TAKE 1 TABLET DAILY TO CONTROL BLOOD PRESSURE   ELIQUIS 5 MG TABS TABLET    TAKE 1 TABLET TWICE A DAY FOR ANTICOAGULATION   FINASTERIDE (PROSCAR) 5 MG TABLET    TAKE 1 TABLET ONCE DAILY FOR URINATING DRIBBLING/PROSTATE ENLARGEMENT   METOPROLOL SUCCINATE (TOPROL-XL) 50 MG 24 HR TABLET    TAKE 1 TABLET TWICE A DAY TO CONTROL BLOOD PRESSURE AND HEART RHYTHM   NAPROXEN SODIUM (ANAPROX) 220 MG TABLET    Take 220 mg by mouth. Take one up to twice daily for arthritis as needed   RANITIDINE (ZANTAC) 150 MG TABLET    Take one tablet twice daily to reduce stomach acid   SIMVASTATIN (ZOCOR) 10 MG TABLET    TAKE 1 TABLET DAILY TO LOWER CHOLESTEROL   TERAZOSIN (HYTRIN) 2 MG CAPSULE    TAKE 1 CAPSULE DAILY   VALSARTAN (DIOVAN) 320 MG TABLET    TAKE 1 TABLET DAILY TO CONTROL BLOOD PRESSURE  Modified Medications   No medications on file  Discontinued Medications   No medications on file     Review of Systems  Constitutional: Negative for activity change, appetite change and fatigue.  HENT: Positive for hearing loss. Negative for congestion and ear pain.        Tinnitus  Eyes: Positive for visual disturbance (corrective lenses).  Cardiovascular: Negative for chest pain, palpitations and leg swelling.       Hx PAF  Gastrointestinal: Positive for constipation. Negative for abdominal distention, abdominal pain, anal bleeding, blood in stool, diarrhea, nausea and rectal pain.       Hemorrhoids  Endocrine: Negative.   Genitourinary: Positive for frequency. Negative for decreased urine volume, flank pain, penile pain, testicular pain and urgency.  Musculoskeletal: Positive for arthralgias and back pain. Negative for gait problem, myalgias and neck pain.       Left knee pains. Chronic back pains. Pain in both shoulders with abduction and rotational movements.  Skin:       Removal of basal cell cancer of the left leg 12/10/14.  Allergic/Immunologic: Negative.   Neurological: Negative for dizziness, tremors, weakness, light-headedness, numbness and headaches.       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 Paresthesias of the right arm.  Psychiatric/Behavioral: Positive for sleep disturbance (Difficulty falling asleep). Negative for suicidal  ideas. The patient is not nervous/anxious and is not hyperactive.     Vitals:   07/23/16 0939  BP: (!) 148/80  Pulse: (!) 55  Temp: 97.9 F (36.6 C)  TempSrc: Oral  SpO2: 97%  Weight: 185 lb (83.9 kg)  Height: 5' 7"  (1.702 m)   Wt Readings from Last 3 Encounters:  07/23/16 185 lb (83.9 kg)  01/23/16 187 lb 9.6 oz (85.1 kg)  07/18/15 187 lb (84.8 kg)    Body mass index is 28.98 kg/m.  Physical Exam  Constitutional: He is oriented to person, place, and time. He appears well-developed and well-nourished. No distress.  HENT:  Head: Normocephalic.  Right Ear: External ear normal.  Left Ear: External ear  normal.  Nose: Nose normal.  Mouth/Throat: Oropharynx is clear and moist. No oropharyngeal exudate.  Bilateral hearing loss  Eyes: Conjunctivae and EOM are normal. Pupils are equal, round, and reactive to light.  Neck: No JVD present. No tracheal deviation present. No thyromegaly present.  Cardiovascular: Normal rate, regular rhythm, normal heart sounds and intact distal pulses.  Exam reveals no gallop and no friction rub.   No murmur heard. Normal pulses in left arm.  Pulmonary/Chest: No respiratory distress. He has no rales.  Abdominal: He exhibits no distension and no mass. There is no tenderness.  Genitourinary: Rectum normal, prostate normal and penis normal. Rectal exam shows guaiac negative stool. No penile tenderness.  Genitourinary Comments: Prostate slightly enlarged. No nodules.  Musculoskeletal: Normal range of motion. He exhibits no edema or tenderness.  Chronic back discomfort. Some difficulty lying down and getting up again. Pain in both shoulder areas with rotation and abduction.  Neurological: He is alert and oriented to person, place, and time. He has normal reflexes. No cranial nerve deficit. Coordination normal.  Skin: No rash noted. No erythema. No pallor.  Scaling areas on the forehead and scalp as well as along the left neck. Break in the skin of the left foreleg. Healing.  Psychiatric: He has a normal mood and affect. His behavior is normal. Judgment and thought content normal.  Mildly obsessive     Labs reviewed: Lab Summary Latest Ref Rng & Units 07/18/2016 01/15/2016 07/10/2015 01/02/2015  Hemoglobin - (None) (None) (None) (None)  Hematocrit - (None) (None) (None) (None)  White count - (None) (None) (None) (None)  Platelet count - (None) (None) (None) (None)  Sodium 137 - 147 mmol/L 142 143 140 142  Potassium 3.4 - 5.3 mmol/L 3.9 4.2 3.7 4.2  Calcium - (None) (None) (None) (None)  Phosphorus - (None) (None) (None) (None)  Creatinine 0.6 - 1.3 mg/dL 1.1 1.2 1.1  1.1  AST 14 - 40 U/L 15 17 (None) 15  Alk Phos 25 - 125 U/L 81 83 (None) 97  Bilirubin - (None) (None) (None) (None)  Glucose mg/dL 85 97 89 88  Cholesterol 0 - 200 mg/dL 133 147 155 141  HDL cholesterol 35 - 70 mg/dL 42 44 45 43  Triglycerides 40 - 160 mg/dL 117 141 139 140  LDL Direct - (None) (None) (None) (None)  LDL Calc mg/dL 68 75 82 70  Total protein - (None) (None) (None) (None)  Albumin - (None) (None) (None) (None)  Some recent data might be hidden   No results found for: TSH Lab Results  Component Value Date   BUN 16 07/18/2016   BUN 13 01/15/2016   BUN 12 07/10/2015   Lab Results  Component Value Date   CREATININE 1.1 07/18/2016   CREATININE  1.2 01/15/2016   CREATININE 1.1 07/10/2015   No results found for: HGBA1C     Assessment/Plan  1. Essential hypertension Controlled  2. Hyperlipidemia Controlled  3. Paroxysmal atrial fibrillation (HCC) Currently normal sinus rhythm  4. Long term current use of anticoagulant therapy Eliquis for PAF  5. Chronic midline low back pain without sciatica Discussed possible referral. Patient not interested in referral at this time. Feeling well today.  6. Open wound of knee, leg (except thigh), and ankle, complicated, left, initial encounter Recommended daily. Prescription written for [facility].

## 2016-08-09 ENCOUNTER — Other Ambulatory Visit: Payer: Self-pay

## 2016-08-23 ENCOUNTER — Other Ambulatory Visit: Payer: Self-pay | Admitting: Internal Medicine

## 2016-09-04 DIAGNOSIS — Z23 Encounter for immunization: Secondary | ICD-10-CM | POA: Diagnosis not present

## 2016-09-26 DIAGNOSIS — Z23 Encounter for immunization: Secondary | ICD-10-CM | POA: Diagnosis not present

## 2016-10-08 ENCOUNTER — Other Ambulatory Visit: Payer: Self-pay | Admitting: *Deleted

## 2016-10-08 DIAGNOSIS — R972 Elevated prostate specific antigen [PSA]: Secondary | ICD-10-CM

## 2016-10-08 DIAGNOSIS — I10 Essential (primary) hypertension: Secondary | ICD-10-CM

## 2016-10-08 DIAGNOSIS — E7849 Other hyperlipidemia: Secondary | ICD-10-CM

## 2016-10-09 ENCOUNTER — Other Ambulatory Visit: Payer: Self-pay

## 2016-10-09 DIAGNOSIS — I10 Essential (primary) hypertension: Secondary | ICD-10-CM

## 2016-10-09 DIAGNOSIS — E7849 Other hyperlipidemia: Secondary | ICD-10-CM

## 2016-10-09 DIAGNOSIS — R972 Elevated prostate specific antigen [PSA]: Secondary | ICD-10-CM

## 2016-12-09 ENCOUNTER — Other Ambulatory Visit: Payer: Self-pay | Admitting: Internal Medicine

## 2016-12-13 DIAGNOSIS — Z85828 Personal history of other malignant neoplasm of skin: Secondary | ICD-10-CM | POA: Diagnosis not present

## 2016-12-13 DIAGNOSIS — L918 Other hypertrophic disorders of the skin: Secondary | ICD-10-CM | POA: Diagnosis not present

## 2016-12-13 DIAGNOSIS — D1801 Hemangioma of skin and subcutaneous tissue: Secondary | ICD-10-CM | POA: Diagnosis not present

## 2016-12-13 DIAGNOSIS — L821 Other seborrheic keratosis: Secondary | ICD-10-CM | POA: Diagnosis not present

## 2016-12-13 DIAGNOSIS — L570 Actinic keratosis: Secondary | ICD-10-CM | POA: Diagnosis not present

## 2016-12-13 DIAGNOSIS — L853 Xerosis cutis: Secondary | ICD-10-CM | POA: Diagnosis not present

## 2017-01-28 ENCOUNTER — Encounter: Payer: Self-pay | Admitting: Internal Medicine

## 2017-01-28 ENCOUNTER — Non-Acute Institutional Stay: Payer: Medicare Other | Admitting: Internal Medicine

## 2017-01-28 VITALS — BP 108/70 | HR 63 | Temp 98.1°F | Resp 20 | Ht 67.0 in | Wt 185.4 lb

## 2017-01-28 DIAGNOSIS — R972 Elevated prostate specific antigen [PSA]: Secondary | ICD-10-CM

## 2017-01-28 DIAGNOSIS — I480 Paroxysmal atrial fibrillation: Secondary | ICD-10-CM | POA: Diagnosis not present

## 2017-01-28 DIAGNOSIS — I10 Essential (primary) hypertension: Secondary | ICD-10-CM | POA: Diagnosis not present

## 2017-01-28 DIAGNOSIS — Z7901 Long term (current) use of anticoagulants: Secondary | ICD-10-CM

## 2017-01-28 DIAGNOSIS — E785 Hyperlipidemia, unspecified: Secondary | ICD-10-CM

## 2017-01-28 NOTE — Addendum Note (Signed)
Addended by: Estill Dooms on: 01/28/2017 02:26 PM   Modules accepted: Orders

## 2017-01-28 NOTE — Progress Notes (Signed)
Facility  [facility]    Place of Service: Clinic (12)     Allergies  Allergen Reactions  . Ciprofloxacin   . Sulfa Antibiotics   . Zestril [Lisinopril]     Chief Complaint  Patient presents with  . Medical Management of Chronic Issues    HPI:  Essential hypertension - controlled  Hyperlipidemia, unspecified hyperlipidemia type - no recent lab. Remains on simvastatin  Paroxysmal atrial fibrillation (HCC) - denies palpitations  Long term current use of anticoagulant therapy - Eliquis for PAF    Medications: Patient's Medications  New Prescriptions   No medications on file  Previous Medications   AMLODIPINE (NORVASC) 2.5 MG TABLET    TAKE 1 TABLET DAILY TO CONTROL BLOOD PRESSURE   ELIQUIS 5 MG TABS TABLET    TAKE 1 TABLET TWICE A DAY FOR ANTICOAGULATION   FINASTERIDE (PROSCAR) 5 MG TABLET    TAKE 1 TABLET ONCE DAILY FOR URINATING DRIBBLING/PROSTATE ENLARGEMENT   NAPROXEN SODIUM (ANAPROX) 220 MG TABLET    Take 220 mg by mouth. Take one up to twice daily for arthritis as needed   RANITIDINE (ZANTAC) 150 MG TABLET    Take one tablet twice daily to reduce stomach acid   SIMVASTATIN (ZOCOR) 10 MG TABLET    TAKE 1 TABLET DAILY TO LOWER CHOLESTEROL   TERAZOSIN (HYTRIN) 2 MG CAPSULE    TAKE 1 CAPSULE DAILY   TOPROL XL 50 MG 24 HR TABLET    TAKE 1 TABLET TWICE A DAY TO CONTROL BLOOD PRESSURE AND HEART RHYTHM   VALSARTAN (DIOVAN) 320 MG TABLET    TAKE 1 TABLET DAILY TO CONTROL BLOOD PRESSURE  Modified Medications   No medications on file  Discontinued Medications   No medications on file     Review of Systems  Constitutional: Negative for activity change, appetite change and fatigue.  HENT: Positive for hearing loss. Negative for congestion and ear pain.        Tinnitus  Eyes: Positive for visual disturbance (corrective lenses).  Cardiovascular: Negative for chest pain, palpitations and leg swelling.       Hx PAF  Gastrointestinal: Positive for constipation. Negative for  abdominal distention, abdominal pain, anal bleeding, blood in stool, diarrhea, nausea and rectal pain.       Hemorrhoids  Endocrine: Negative.   Genitourinary: Positive for frequency. Negative for decreased urine volume, flank pain, penile pain, testicular pain and urgency.  Musculoskeletal: Positive for arthralgias and back pain. Negative for gait problem, myalgias and neck pain.       Left knee pains. Chronic back pains. Pain in both shoulders with abduction and rotational movements.  Skin:       Removal of basal cell cancer of the left leg 12/10/14.  Allergic/Immunologic: Negative.   Neurological: Negative for dizziness, tremors, weakness, light-headedness, numbness and headaches.       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 Paresthesias of the right arm.  Psychiatric/Behavioral: Positive for sleep disturbance (Difficulty falling asleep). Negative for suicidal ideas. The patient is not nervous/anxious and is not hyperactive.     Vitals:   01/28/17 1043  BP: 108/70  Pulse: 63  Resp: 20  Temp: 98.1 F (36.7 C)  SpO2: 99%  Weight: 185 lb 6.4 oz (84.1 kg)  Height: _0  (1.702 m)   Wt Readings from Last 3 Encounters:  01/28/17 185 lb 6.4 oz (84.1 kg)  07/23/16 185 lb (83.9 kg)  01/23/16 187 lb 9.6 oz (85.1 kg)    Body  mass index is 29.04 kg/m.  Physical Exam  Constitutional: He is oriented to person, place, and time. He appears well-developed and well-nourished. No distress.  HENT:  Head: Normocephalic.  Right Ear: External ear normal.  Left Ear: External ear normal.  Nose: Nose normal.  Mouth/Throat: Oropharynx is clear and moist. No oropharyngeal exudate.  Bilateral hearing loss  Eyes: Conjunctivae and EOM are normal. Pupils are equal, round, and reactive to light.  Neck: No JVD present. No tracheal deviation present. No thyromegaly present.  Cardiovascular: Normal rate, regular rhythm, normal heart sounds and intact distal pulses.  Exam reveals no gallop and no friction  rub.   No murmur heard. Normal pulses in left arm.  Pulmonary/Chest: No respiratory distress. He has no rales.  Abdominal: He exhibits no distension and no mass. There is no tenderness.  Genitourinary: Rectum normal, prostate normal and penis normal. Rectal exam shows guaiac negative stool. No penile tenderness.  Genitourinary Comments: Prostate slightly enlarged. No nodules.  Musculoskeletal: Normal range of motion. He exhibits no edema or tenderness.  Chronic back discomfort. Some difficulty lying down and getting up again. Pain in both shoulder areas with rotation and abduction.  Neurological: He is alert and oriented to person, place, and time. He has normal reflexes. No cranial nerve deficit. Coordination normal.  Skin: No rash noted. No erythema. No pallor.  Scaling areas on the forehead and scalp as well as along the left neck. Break in the skin of the left foreleg. Healing.  Psychiatric: He has a normal mood and affect. His behavior is normal. Judgment and thought content normal.  Mildly obsessive     Labs reviewed: Lab Summary Latest Ref Rng & Units 07/18/2016 01/15/2016 07/10/2015 01/02/2015  Hemoglobin - (None) (None) (None) (None)  Hematocrit - (None) (None) (None) (None)  White count - (None) (None) (None) (None)  Platelet count - (None) (None) (None) (None)  Sodium 137 - 147 mmol/L 142 143 140 142  Potassium 3.4 - 5.3 mmol/L 3.9 4.2 3.7 4.2  Calcium - (None) (None) (None) (None)  Phosphorus - (None) (None) (None) (None)  Creatinine 0.6 - 1.3 mg/dL 1.1 1.2 1.1 1.1  AST 14 - 40 U/L 15 17 (None) 15  Alk Phos 25 - 125 U/L 81 83 (None) 97  Bilirubin - (None) (None) (None) (None)  Glucose mg/dL 85 97 89 88  Cholesterol 0 - 200 mg/dL 133 147 155 141  HDL cholesterol 35 - 70 mg/dL 42 44 45 43  Triglycerides 40 - 160 mg/dL 117 141 139 140  LDL Direct - (None) (None) (None) (None)  LDL Calc mg/dL 68 75 82 70  Total protein - (None) (None) (None) (None)  Albumin - (None) (None)  (None) (None)  Some recent data might be hidden   No results found for: TSH Lab Results  Component Value Date   BUN 16 07/18/2016   BUN 13 01/15/2016   BUN 12 07/10/2015   Lab Results  Component Value Date   CREATININE 1.1 07/18/2016   CREATININE 1.2 01/15/2016   CREATININE 1.1 07/10/2015   No results found for: HGBA1C     Assessment/Plan   1. Essential hypertension The current medical regimen is effective;  continue present plan and medications. - CMP  2. Hyperlipidemia, unspecified hyperlipidemia type The current medical regimen is effective;  continue present plan and medications. - Lipid panel; Future  3. Paroxysmal atrial fibrillation (HCC) Continue Eliquiis  4. Long term current use of anticoagulant therapy Continue Eliquis  5. Elevated prostate  specific antigen (PSA) - PSA; Future

## 2017-02-08 ENCOUNTER — Other Ambulatory Visit: Payer: Self-pay | Admitting: Internal Medicine

## 2017-03-10 ENCOUNTER — Emergency Department (HOSPITAL_COMMUNITY): Payer: Medicare Other

## 2017-03-10 ENCOUNTER — Encounter (HOSPITAL_COMMUNITY): Payer: Self-pay | Admitting: Nurse Practitioner

## 2017-03-10 ENCOUNTER — Inpatient Hospital Stay (HOSPITAL_COMMUNITY)
Admission: EM | Admit: 2017-03-10 | Discharge: 2017-03-13 | DRG: 871 | Disposition: A | Discharge status: Home / Self Care | Payer: Medicare Other | Attending: Internal Medicine | Admitting: Internal Medicine

## 2017-03-10 DIAGNOSIS — N453 Epididymo-orchitis: Secondary | ICD-10-CM | POA: Diagnosis not present

## 2017-03-10 DIAGNOSIS — Z882 Allergy status to sulfonamides status: Secondary | ICD-10-CM | POA: Diagnosis not present

## 2017-03-10 DIAGNOSIS — N3000 Acute cystitis without hematuria: Secondary | ICD-10-CM | POA: Diagnosis not present

## 2017-03-10 DIAGNOSIS — A419 Sepsis, unspecified organism: Secondary | ICD-10-CM | POA: Diagnosis not present

## 2017-03-10 DIAGNOSIS — B9620 Unspecified Escherichia coli [E. coli] as the cause of diseases classified elsewhere: Secondary | ICD-10-CM | POA: Diagnosis present

## 2017-03-10 DIAGNOSIS — N390 Urinary tract infection, site not specified: Secondary | ICD-10-CM | POA: Diagnosis present

## 2017-03-10 DIAGNOSIS — R609 Edema, unspecified: Secondary | ICD-10-CM

## 2017-03-10 DIAGNOSIS — E8779 Other fluid overload: Secondary | ICD-10-CM | POA: Diagnosis not present

## 2017-03-10 DIAGNOSIS — I480 Paroxysmal atrial fibrillation: Secondary | ICD-10-CM | POA: Diagnosis not present

## 2017-03-10 DIAGNOSIS — J810 Acute pulmonary edema: Secondary | ICD-10-CM | POA: Diagnosis not present

## 2017-03-10 DIAGNOSIS — Z888 Allergy status to other drugs, medicaments and biological substances status: Secondary | ICD-10-CM

## 2017-03-10 DIAGNOSIS — K219 Gastro-esophageal reflux disease without esophagitis: Secondary | ICD-10-CM | POA: Diagnosis not present

## 2017-03-10 DIAGNOSIS — R1030 Lower abdominal pain, unspecified: Secondary | ICD-10-CM | POA: Diagnosis not present

## 2017-03-10 DIAGNOSIS — E785 Hyperlipidemia, unspecified: Secondary | ICD-10-CM | POA: Diagnosis present

## 2017-03-10 DIAGNOSIS — Z7901 Long term (current) use of anticoagulants: Secondary | ICD-10-CM

## 2017-03-10 DIAGNOSIS — J80 Acute respiratory distress syndrome: Secondary | ICD-10-CM | POA: Diagnosis not present

## 2017-03-10 DIAGNOSIS — N5089 Other specified disorders of the male genital organs: Secondary | ICD-10-CM | POA: Diagnosis not present

## 2017-03-10 DIAGNOSIS — N179 Acute kidney failure, unspecified: Secondary | ICD-10-CM | POA: Diagnosis present

## 2017-03-10 DIAGNOSIS — I10 Essential (primary) hypertension: Secondary | ICD-10-CM | POA: Diagnosis not present

## 2017-03-10 DIAGNOSIS — R0602 Shortness of breath: Secondary | ICD-10-CM | POA: Diagnosis not present

## 2017-03-10 DIAGNOSIS — R10817 Generalized abdominal tenderness: Secondary | ICD-10-CM | POA: Diagnosis not present

## 2017-03-10 DIAGNOSIS — R52 Pain, unspecified: Secondary | ICD-10-CM

## 2017-03-10 DIAGNOSIS — J9601 Acute respiratory failure with hypoxia: Secondary | ICD-10-CM | POA: Diagnosis not present

## 2017-03-10 DIAGNOSIS — E876 Hypokalemia: Secondary | ICD-10-CM | POA: Diagnosis present

## 2017-03-10 DIAGNOSIS — Z881 Allergy status to other antibiotic agents status: Secondary | ICD-10-CM

## 2017-03-10 DIAGNOSIS — R6520 Severe sepsis without septic shock: Secondary | ICD-10-CM | POA: Diagnosis present

## 2017-03-10 DIAGNOSIS — I4891 Unspecified atrial fibrillation: Secondary | ICD-10-CM | POA: Diagnosis present

## 2017-03-10 DIAGNOSIS — R7989 Other specified abnormal findings of blood chemistry: Secondary | ICD-10-CM | POA: Diagnosis not present

## 2017-03-10 DIAGNOSIS — N50819 Testicular pain, unspecified: Secondary | ICD-10-CM | POA: Diagnosis present

## 2017-03-10 DIAGNOSIS — R0989 Other specified symptoms and signs involving the circulatory and respiratory systems: Secondary | ICD-10-CM

## 2017-03-10 DIAGNOSIS — I482 Chronic atrial fibrillation: Secondary | ICD-10-CM | POA: Diagnosis present

## 2017-03-10 DIAGNOSIS — J9621 Acute and chronic respiratory failure with hypoxia: Secondary | ICD-10-CM | POA: Diagnosis not present

## 2017-03-10 DIAGNOSIS — R740 Nonspecific elevation of levels of transaminase and lactic acid dehydrogenase [LDH]: Secondary | ICD-10-CM | POA: Diagnosis not present

## 2017-03-10 DIAGNOSIS — R062 Wheezing: Secondary | ICD-10-CM

## 2017-03-10 DIAGNOSIS — K2970 Gastritis, unspecified, without bleeding: Secondary | ICD-10-CM | POA: Diagnosis not present

## 2017-03-10 LAB — COMPREHENSIVE METABOLIC PANEL
ALBUMIN: 3.7 g/dL (ref 3.5–5.0)
ALT: 11 U/L — AB (ref 17–63)
AST: 29 U/L (ref 15–41)
Alkaline Phosphatase: 71 U/L (ref 38–126)
Anion gap: 9 (ref 5–15)
BUN: 21 mg/dL — ABNORMAL HIGH (ref 6–20)
CHLORIDE: 106 mmol/L (ref 101–111)
CO2: 21 mmol/L — AB (ref 22–32)
CREATININE: 1.41 mg/dL — AB (ref 0.61–1.24)
Calcium: 9.2 mg/dL (ref 8.9–10.3)
GFR calc non Af Amer: 44 mL/min — ABNORMAL LOW (ref >60)
GFR, EST AFRICAN AMERICAN: 50 mL/min — AB (ref >60)
Glucose, Bld: 126 mg/dL — ABNORMAL HIGH (ref 65–99)
Potassium: 3.6 mmol/L (ref 3.5–5.1)
SODIUM: 136 mmol/L (ref 135–145)
Total Bilirubin: 1.3 mg/dL — ABNORMAL HIGH (ref 0.3–1.2)
Total Protein: 7.7 g/dL (ref 6.5–8.1)

## 2017-03-10 LAB — CBC WITH DIFFERENTIAL/PLATELET
Basophils Absolute: 0 10*3/uL (ref 0.0–0.1)
Basophils Relative: 0 %
EOS ABS: 0 10*3/uL (ref 0.0–0.7)
Eosinophils Relative: 0 %
HEMATOCRIT: 38.2 % — AB (ref 39.0–52.0)
HEMOGLOBIN: 13.8 g/dL (ref 13.0–17.0)
LYMPHS ABS: 0.3 10*3/uL — AB (ref 0.7–4.0)
Lymphocytes Relative: 2 %
MCH: 32 pg (ref 26.0–34.0)
MCHC: 36.1 g/dL — AB (ref 30.0–36.0)
MCV: 88.6 fL (ref 78.0–100.0)
MONOS PCT: 7 %
Monocytes Absolute: 1.3 10*3/uL — ABNORMAL HIGH (ref 0.1–1.0)
NEUTROS ABS: 16.9 10*3/uL — AB (ref 1.7–7.7)
NEUTROS PCT: 91 %
Platelets: 136 10*3/uL — ABNORMAL LOW (ref 150–400)
RBC: 4.31 MIL/uL (ref 4.22–5.81)
RDW: 12.6 % (ref 11.5–15.5)
WBC: 18.6 10*3/uL — ABNORMAL HIGH (ref 4.0–10.5)

## 2017-03-10 LAB — URINALYSIS, ROUTINE W REFLEX MICROSCOPIC
Bilirubin Urine: NEGATIVE
GLUCOSE, UA: NEGATIVE mg/dL
Ketones, ur: 5 mg/dL — AB
Nitrite: POSITIVE — AB
PH: 5 (ref 5.0–8.0)
Protein, ur: 30 mg/dL — AB
Specific Gravity, Urine: 1.017 (ref 1.005–1.030)

## 2017-03-10 LAB — I-STAT CG4 LACTIC ACID, ED: Lactic Acid, Venous: 3.51 mmol/L (ref 0.5–1.9)

## 2017-03-10 LAB — LACTIC ACID, PLASMA: LACTIC ACID, VENOUS: 2.9 mmol/L — AB (ref 0.5–1.9)

## 2017-03-10 MED ORDER — SODIUM CHLORIDE 0.9% FLUSH
3.0000 mL | Freq: Two times a day (BID) | INTRAVENOUS | Status: DC
  Administered 2017-03-11 – 2017-03-13 (×4): 3 mL via INTRAVENOUS

## 2017-03-10 MED ORDER — TERAZOSIN HCL 2 MG PO CAPS
2.0000 mg | ORAL_CAPSULE | Freq: Every day | ORAL | Status: DC
  Administered 2017-03-10 – 2017-03-13 (×4): 2 mg via ORAL
  Filled 2017-03-10 (×4): qty 1

## 2017-03-10 MED ORDER — SIMVASTATIN 20 MG PO TABS
10.0000 mg | ORAL_TABLET | Freq: Every day | ORAL | Status: DC
  Administered 2017-03-10 – 2017-03-12 (×3): 10 mg via ORAL
  Filled 2017-03-10 (×3): qty 1

## 2017-03-10 MED ORDER — SODIUM CHLORIDE 0.9 % IV BOLUS (SEPSIS)
1000.0000 mL | Freq: Once | INTRAVENOUS | Status: AC
  Administered 2017-03-10: 1000 mL via INTRAVENOUS

## 2017-03-10 MED ORDER — APIXABAN 5 MG PO TABS
5.0000 mg | ORAL_TABLET | Freq: Two times a day (BID) | ORAL | Status: DC
  Administered 2017-03-10 – 2017-03-13 (×7): 5 mg via ORAL
  Filled 2017-03-10 (×7): qty 1

## 2017-03-10 MED ORDER — ACETAMINOPHEN 650 MG RE SUPP: 650.0000 mg | Freq: Four times a day (QID) | RECTAL | Status: DC | PRN

## 2017-03-10 MED ORDER — SODIUM CHLORIDE 0.9 % IV SOLN
1000.0000 mL | INTRAVENOUS | Status: DC
  Administered 2017-03-10 – 2017-03-12 (×6): 1000 mL via INTRAVENOUS

## 2017-03-10 MED ORDER — AMLODIPINE BESYLATE 5 MG PO TABS
2.5000 mg | ORAL_TABLET | Freq: Every day | ORAL | Status: DC
  Administered 2017-03-10 – 2017-03-13 (×4): 2.5 mg via ORAL
  Filled 2017-03-10 (×4): qty 1

## 2017-03-10 MED ORDER — DEXTROSE 5 % IV SOLN
2.0000 g | Freq: Once | INTRAVENOUS | Status: AC
  Administered 2017-03-10: 2 g via INTRAVENOUS
  Filled 2017-03-10: qty 2

## 2017-03-10 MED ORDER — FINASTERIDE 5 MG PO TABS
5.0000 mg | ORAL_TABLET | Freq: Every day | ORAL | Status: DC
  Administered 2017-03-10 – 2017-03-13 (×4): 5 mg via ORAL
  Filled 2017-03-10 (×4): qty 1

## 2017-03-10 MED ORDER — SODIUM CHLORIDE 0.9 % IV BOLUS (SEPSIS)
500.0000 mL | Freq: Once | INTRAVENOUS | Status: AC
  Administered 2017-03-10: 500 mL via INTRAVENOUS

## 2017-03-10 MED ORDER — METOPROLOL SUCCINATE ER 50 MG PO TB24
50.0000 mg | ORAL_TABLET | Freq: Two times a day (BID) | ORAL | Status: DC
  Administered 2017-03-10 – 2017-03-13 (×6): 50 mg via ORAL
  Filled 2017-03-10 (×6): qty 1

## 2017-03-10 MED ORDER — ACETAMINOPHEN 325 MG PO TABS
650.0000 mg | ORAL_TABLET | Freq: Four times a day (QID) | ORAL | Status: DC | PRN
  Administered 2017-03-10 – 2017-03-11 (×4): 650 mg via ORAL
  Filled 2017-03-10 (×5): qty 2

## 2017-03-10 MED ORDER — IRBESARTAN 150 MG PO TABS
150.0000 mg | ORAL_TABLET | Freq: Every day | ORAL | Status: DC
  Administered 2017-03-10 – 2017-03-13 (×4): 150 mg via ORAL
  Filled 2017-03-10 (×4): qty 1

## 2017-03-10 MED ORDER — DEXTROSE 5 % IV SOLN
1.0000 g | INTRAVENOUS | Status: DC
  Administered 2017-03-11: 1 g via INTRAVENOUS
  Filled 2017-03-10: qty 10

## 2017-03-10 MED ORDER — FAMOTIDINE 20 MG PO TABS
20.0000 mg | ORAL_TABLET | Freq: Every day | ORAL | Status: DC
  Administered 2017-03-10 – 2017-03-13 (×4): 20 mg via ORAL
  Filled 2017-03-10 (×4): qty 1

## 2017-03-10 MED ORDER — CHLORHEXIDINE GLUCONATE 0.12 % MT SOLN
15.0000 mL | Freq: Two times a day (BID) | OROMUCOSAL | Status: DC
  Administered 2017-03-10 – 2017-03-13 (×6): 15 mL via OROMUCOSAL
  Filled 2017-03-10 (×6): qty 15

## 2017-03-10 NOTE — ED Notes (Signed)
Abnormal lab result MD Tomi Bamberger and RN Lala Lund have been made aware

## 2017-03-10 NOTE — Progress Notes (Signed)
ANTICOAGULATION CONSULT NOTE - Initial Consult  Pharmacy Consult for eliquis Indication: nonvavular atrial fibriallation  Allergies  Allergen Reactions  . Ciprofloxacin Other (See Comments)    Patient doesn't like the way it makes him feel  . Zestril [Lisinopril]     unknown  . Sulfa Antibiotics Rash    Patient Measurements: Height: 5\' 7"  (170.2 cm) Weight: 182 lb 12.2 oz (82.9 kg) IBW/kg (Calculated) : 66.1   Vital Signs: Temp: 98.9 F (37.2 C) (03/26 1223) Temp Source: Oral (03/26 1223) BP: 145/72 (03/26 1223) Pulse Rate: 95 (03/26 1223)  Labs:  Recent Labs  03/10/17 0850  HGB 13.8  HCT 38.2*  PLT 136*  CREATININE 1.41*    Estimated Creatinine Clearance: 38.7 mL/min (A) (by C-G formula based on SCr of 1.41 mg/dL (H)).   Medical History: Past Medical History:  Diagnosis Date  . Atrial fibrillation ([place]) 03/26/2011  . Basal cell carcinoma of other specified sites of skin 4/10/012  . Basal cell carcinoma of skin, site unspecified 03/25/2012  . Benign neoplasm of colon 03/25/2012   adenomatous polyps  . Colon polyp 2014  . Degeneration of lumbar or lumbosacral intervertebral disc 09/24/2011  . Disturbance of skin sensation 01/28/2012  . Diverticulosis 2014  . Dysphagia, unspecified(787.20) 03/26/2011  . Elevated prostate specific antigen (PSA) 03/26/2011  . Exposure to unspecified radiation 03/26/2011  . Family history of colon cancer 07/16/2013  . GERD (gastroesophageal reflux disease) 07/16/2013  . Hearing deficit 12/21/2013  . Hemangioma of unspecified site 03/26/2011  . Hx of adenomatous colonic polyps 07/16/2013  . Hypertrophy of prostate with urinary obstruction and other lower urinary tract symptoms (LUTS) 03/26/2011  . Ingrowing nail 03/26/2011  . Internal hemorrhoids without mention of complication 0/0/76  . Keratosis, actinic 01/28/2012  . Long term (current) use of anticoagulants 03/26/2011  . Long term current use of anticoagulant therapy 07/23/2016  . Lumbago  03/26/2011  . 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 Osteoarthrosis, unspecified whether generalized or localized, unspecified site 11/24/2012  . Other and unspecified hyperlipidemia 03/26/2011  . Other malaise and fatigue 03/26/2011  . Pain in joint, forearm 11/24/2012  . Pain in joint, lower leg 06/15/13   left knee  . Paresthesia of left arm 07/05/2014  . Rectal bleed 06/15/13  . Reflux esophagitis 03/26/2011  . Sacroiliitis, not elsewhere classified ([place]) 05/28/2011  . Scoliosis (and kyphoscoliosis), idiopathic 05/28/2011  . 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 Tinnitus 12/21/2013  . 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 Unspecified essential hypertension 03/26/2011     Assessment: 83 y.o. male with medical history significant of paroxysmal A. fib, hypertension, hyperlipidemia, GERD, presents to the emergency room with chief complaint of urinary frequency.  Pharmacy to dose apixaban for Nonvalvular Atrial Fibrillation.   PTA apixaban 5mg  twice daily, last dose 3/25 @ 1300.  Scr 1.4 Plts 136  Goal of Therapy:  apixaban per renal function and indication   Plan:  Continue home dose of apixaban 5mg  twice daily Will sign off and follow peripherally  Dolly Rias RPh 03/10/2017, 12:43 PM Pager [phone]

## 2017-03-10 NOTE — ED Notes (Signed)
PT have been made aware of urine sample 

## 2017-03-10 NOTE — ED Provider Notes (Signed)
[facility] DEPT Provider Note   CSN: [id number] Arrival date & time: 03/10/17  6314     History   Chief Complaint Chief Complaint  Patient presents with  . Abdominal Pain  . Burning with urination    HPI Phillip [name] is a 83 y.o. male.  HPI 3-4 days ago pt started developing urinary frequency.  He noticed he was waking up frequently at night to urinate.   He started to develop pain in his lower abdomen over the next couple of days.  One of his testicles is sore.   He started to feel feverish over the weekend.  No nausea or vomiting.  No diarrhea.  He feels like he might have a uti.  He has had these before.  Past Medical History:  Diagnosis Date  . Atrial fibrillation ([place]) 03/26/2011  . Basal cell carcinoma of other specified sites of skin 4/10/012  . Basal cell carcinoma of skin, site unspecified 03/25/2012  . Benign neoplasm of colon 03/25/2012   adenomatous polyps  . Colon polyp 2014  . Degeneration of lumbar or lumbosacral intervertebral disc 09/24/2011  . Disturbance of skin sensation 01/28/2012  . Diverticulosis 2014  . Dysphagia, unspecified(787.20) 03/26/2011  . Elevated prostate specific antigen (PSA) 03/26/2011  . Exposure to unspecified radiation 03/26/2011  . Family history of colon cancer 07/16/2013  . GERD (gastroesophageal reflux disease) 07/16/2013  . Hearing deficit 12/21/2013  . Hemangioma of unspecified site 03/26/2011  . Hx of adenomatous colonic polyps 07/16/2013  . Hypertrophy of prostate with urinary obstruction and other lower urinary tract symptoms (LUTS) 03/26/2011  . Ingrowing nail 03/26/2011  . Internal hemorrhoids without mention of complication 08/22/01  . Keratosis, actinic 01/28/2012  . Long term (current) use of anticoagulants 03/26/2011  . Long term current use of anticoagulant therapy 07/23/2016  . Lumbago 03/26/2011  . 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 Osteoarthrosis, unspecified whether generalized or localized,  unspecified site 11/24/2012  . Other and unspecified hyperlipidemia 03/26/2011  . Other malaise and fatigue 03/26/2011  . Pain in joint, forearm 11/24/2012  . Pain in joint, lower leg 06/15/13   left knee  . Paresthesia of left arm 07/05/2014  . Rectal bleed 06/15/13  . Reflux esophagitis 03/26/2011  . Sacroiliitis, not elsewhere classified ([place]) 05/28/2011  . Scoliosis (and kyphoscoliosis), idiopathic 05/28/2011  . 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 Tinnitus 12/21/2013  . 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 Unspecified essential hypertension 03/26/2011    Patient Active Problem List   Diagnosis Date Noted  . Long term current use of anticoagulant therapy 07/23/2016  . Open wound of knee, leg (except thigh), and ankle, complicated 63/78/5885  . Blurred vision 01/23/2016  . Paresthesia of right arm 07/18/2015  . Insomnia 07/18/2015  . Lumbago 07/18/2015  . Basal cell carcinoma, leg 01/12/2015  . Pain in right shoulder 11/24/2014  . Neck pain 11/24/2014  . Paresthesia of left arm 07/05/2014  . Tinnitus 12/21/2013  . Hearing deficit 12/21/2013  . Hx of adenomatous colonic polyps 07/16/2013  . Family history of colon cancer 07/16/2013  . GERD (gastroesophageal reflux disease) 07/16/2013  . Pain in joint, lower leg 06/15/2013  . Internal hemorrhoids without mention of complication 02/77/4128  . Rectal bleed 06/15/2013  . Benign neoplasm of colon   . Atrial fibrillation ([place]) 03/26/2011  . Hyperlipidemia 03/26/2011  . Essential hypertension 03/26/2011  . Elevated prostate specific  antigen (PSA) 03/26/2011    Past Surgical History:  Procedure Laterality Date  . BASAL CELL CARCINOMA EXCISION Left 12/10/14   Dr. Jarome Matin  . COLONOSCOPY  08/12/2013   Dr. Hilarie Fredrickson adenomatous polyps  . CRYOTHERAPY Left 2009   hand  . PHOTODYNAMIC THERAPY  09/26/14   face and ear lobs  Dr. Jarome Matin  . SQUAMOUS CELL CARCINOMA EXCISION Left 2010   hand       Home Medications    Prior to Admission medications   Medication Sig Start Date End Date Taking? Authorizing Provider  amLODipine (NORVASC) 2.5 MG tablet TAKE 1 TABLET DAILY TO CONTROL BLOOD PRESSURE 12/10/16  Yes Estill Dooms, MD  ELIQUIS 5 MG TABS tablet TAKE 1 TABLET TWICE A DAY FOR ANTICOAGULATION 02/10/17  Yes Estill Dooms, MD  esomeprazole (NEXIUM) 20 MG capsule Take 20 mg by mouth daily at 12 noon.   Yes Historical Provider, MD  finasteride (PROSCAR) 5 MG tablet TAKE 1 TABLET ONCE DAILY FOR URINATING DRIBBLING/PROSTATE ENLARGEMENT 06/17/16  Yes Estill Dooms, MD  simvastatin (ZOCOR) 10 MG tablet TAKE 1 TABLET DAILY TO LOWER CHOLESTEROL 09/25/15  Yes Estill Dooms, MD  terazosin (HYTRIN) 2 MG capsule TAKE 1 CAPSULE DAILY 06/21/16  Yes Estill Dooms, MD  TOPROL XL 50 MG 24 hr tablet TAKE 1 TABLET TWICE A DAY TO CONTROL BLOOD PRESSURE AND HEART RHYTHM 08/23/16  Yes Estill Dooms, MD  ranitidine (ZANTAC) 150 MG tablet Take one tablet twice daily to reduce stomach acid Patient not taking: Reported on 03/10/2017 05/14/16   Estill Dooms, MD  valsartan (DIOVAN) 320 MG tablet TAKE 1 TABLET DAILY TO CONTROL BLOOD PRESSURE Patient not taking: Reported on 03/10/2017 06/17/16   Estill Dooms, MD    Family History Family History  Problem Relation Age of Onset  . Cancer Mother     colon  . Heart disease Father     MI    Social History Social History  Substance Use Topics  . Smoking status: Never Smoker  . Smokeless tobacco: Never Used  . Alcohol use No     Allergies   Ciprofloxacin; Zestril [lisinopril]; and Sulfa antibiotics   Review of Systems Review of Systems  All other systems reviewed and are negative.    Physical Exam Updated Vital Signs BP (!) 152/80   Pulse (!) 102   Temp 100.3 F (37.9 C) (Oral)   Resp 18   Ht 5\' 7"  (1.702 m)   Wt 82.1 kg   SpO2 94%   BMI 28.35 kg/m   Physical Exam    Constitutional: He appears well-developed and well-nourished. No distress.  HENT:  Head: Normocephalic and atraumatic.  Right Ear: External ear normal.  Left Ear: External ear normal.  Eyes: Conjunctivae are normal. Right eye exhibits no discharge. Left eye exhibits no discharge. No scleral icterus.  Neck: Neck supple. No tracheal deviation present.  Cardiovascular: Normal rate, regular rhythm and intact distal pulses.   Pulmonary/Chest: Effort normal and breath sounds normal. No stridor. No respiratory distress. He has no wheezes. He has no rales.  Abdominal: Soft. Bowel sounds are normal. He exhibits no distension. There is tenderness in the suprapubic area. There is no rebound and no guarding. Hernia confirmed negative in the left inguinal area.  Genitourinary: Right testis shows swelling and tenderness. Left testis shows no swelling and no tenderness.  Musculoskeletal: He exhibits no edema or tenderness.  Neurological: He is alert. He has normal strength.  No cranial nerve deficit (no facial droop, extraocular movements intact, no slurred speech) or sensory deficit. He exhibits normal muscle tone. He displays no seizure activity. Coordination normal.  Skin: Skin is warm and dry. No rash noted.  Psychiatric: He has a normal mood and affect.  Nursing note and vitals reviewed.    ED Treatments / Results  Labs (all labs ordered are listed, but only abnormal results are displayed) Labs Reviewed  COMPREHENSIVE METABOLIC PANEL - Abnormal; Notable for the following:       Result Value   CO2 21 (*)    Glucose, Bld 126 (*)    BUN 21 (*)    Creatinine, Ser 1.41 (*)    ALT 11 (*)    Total Bilirubin 1.3 (*)    GFR calc non Af Amer 44 (*)    GFR calc Af Amer 50 (*)    All other components within normal limits  CBC WITH DIFFERENTIAL/PLATELET - Abnormal; Notable for the following:    WBC 18.6 (*)    HCT 38.2 (*)    MCHC 36.1 (*)    Platelets 136 (*)    Neutro Abs 16.9 (*)    Lymphs Abs  0.3 (*)    Monocytes Absolute 1.3 (*)    All other components within normal limits  URINALYSIS, ROUTINE W REFLEX MICROSCOPIC - Abnormal; Notable for the following:    APPearance HAZY (*)    Hgb urine dipstick MODERATE (*)    Ketones, ur 5 (*)    Protein, ur 30 (*)    Nitrite POSITIVE (*)    Leukocytes, UA MODERATE (*)    Bacteria, UA RARE (*)    Squamous Epithelial / LPF 0-5 (*)    All other components within normal limits  I-STAT CG4 LACTIC ACID, ED - Abnormal; Notable for the following:    Lactic Acid, Venous 3.51 (*)    All other components within normal limits  CULTURE, BLOOD (ROUTINE X 2)  CULTURE, BLOOD (ROUTINE X 2)  URINE CULTURE    Radiology Scrotal US pending  Procedures .Critical Care Performed by: Dorie Rank Authorized by: Dorie Rank   Critical care provider statement:    Critical care time (minutes):  30   Critical care was necessary to treat or prevent imminent or life-threatening deterioration of the following conditions:  Sepsis   Critical care was time spent personally by me on the following activities:  Discussions with consultants, evaluation of patient's response to treatment, examination of patient, ordering and performing treatments and interventions, ordering and review of laboratory studies, ordering and review of radiographic studies, pulse oximetry, re-evaluation of patient's condition, obtaining history from patient or surrogate and review of old charts   (including critical care time)  Medications Ordered in ED Medications  0.9 %  sodium chloride infusion (1,000 mLs Intravenous New Bag/Given 03/10/17 0909)  sodium chloride 0.9 % bolus 1,000 mL (0 mLs Intravenous Stopped 03/10/17 0955)    And  sodium chloride 0.9 % bolus 1,000 mL (0 mLs Intravenous Stopped 03/10/17 1034)    And  sodium chloride 0.9 % bolus 500 mL (500 mLs Intravenous New Bag/Given 03/10/17 1034)  cefTRIAXone (ROCEPHIN) 1 g in dextrose 5 % 50 mL IVPB (not administered)  cefTRIAXone  (ROCEPHIN) 2 g in dextrose 5 % 50 mL IVPB (0 g Intravenous Stopped 03/10/17 0950)     Initial Impression / Assessment and Plan / ED Course  I have reviewed the triage vital signs and the nursing notes.  Pertinent labs &  imaging results that were available during my care of the patient were reviewed by me and considered in my medical decision making (see chart for details).  Clinical Course as of Mar 11 1039  Mon Mar 10, 2017  0630 Lactic acid elevated.  30 cc per kg bolus ordered.  IV abx ordered.  [JK]  1026 UA shows UTI.   Suspect testicular pain is related to epididymitis orchitis.  Will consult with medical service for admission.  Will order [place]  [JK]    Clinical Course User Index [JK] Dorie Rank, MD   Pt presents with UTI, fever, testicular swelling.  Labs suggest UTI, elevated lactic acid level and wbc count.  Treated for possible early sepsis.  Will need to monitor closely.  Admit to medical service for further treatment.  Final Clinical Impressions(s) / ED Diagnoses   Final diagnoses:  Acute cystitis without hematuria  Elevated lactic acid level      Dorie Rank, MD 03/10/17 1040

## 2017-03-10 NOTE — H&P (Addendum)
History and Physical    Shown [place] QJF:[id number] DOB: 1929-04-06 DOA: 03/10/2017  I have briefly reviewed the patient's prior medical records in [facility]  PCP: Jeanmarie Hubert, MD  Patient coming from: Home ([facility])  Chief Complaint: chills, lower abdominal pain, dysuria  HPI: Phillip [name] is a 83 y.o. male with medical history significant of paroxysmal A. fib, hypertension, hyperlipidemia, GERD, presents to the emergency room with chief complaint of urinary frequency.  He has noticed progressive dysuria, frequency and lower abdominal pain over the last 2 days.  He is also complaining of pain in his right testicle.  He has had chills over the weekend.  He denies any upper abdominal pain, nausea or vomiting.  His symptoms remind him of his previous urinary tract infections, last one he had was 4-5 years ago.  He has no chest pain or shortness of breath.  He denies any lightheadedness or dizziness.  At baseline, he is usually pretty active, and plays tennis at least once a week.  ED Course: In the emergency room patient was febrile, he had elevated heart rate into the low 100s, had mild creatinine elevation of 1.4, significant leukocytosis with a white count of 18 as well as an elevated lactic acid of 3.5.  His urinalysis was grossly positive for infection.  He was given ceftriaxone and TRH was asked for admission for sepsis due to UTI.  Review of Systems: As per HPI otherwise 10 point review of systems negative.   Past Medical History:  Diagnosis Date  . Atrial fibrillation ([place]) 03/26/2011  . Basal cell carcinoma of other specified sites of skin 4/10/012  . Basal cell carcinoma of skin, site unspecified 03/25/2012  . Benign neoplasm of colon 03/25/2012   adenomatous polyps  . Colon polyp 2014  . Degeneration of lumbar or lumbosacral intervertebral disc 09/24/2011  . Disturbance of skin sensation 01/28/2012  . Diverticulosis 2014  . Dysphagia, unspecified(787.20)  03/26/2011  . Elevated prostate specific antigen (PSA) 03/26/2011  . Exposure to unspecified radiation 03/26/2011  . Family history of colon cancer 07/16/2013  . GERD (gastroesophageal reflux disease) 07/16/2013  . Hearing deficit 12/21/2013  . Hemangioma of unspecified site 03/26/2011  . Hx of adenomatous colonic polyps 07/16/2013  . Hypertrophy of prostate with urinary obstruction and other lower urinary tract symptoms (LUTS) 03/26/2011  . Ingrowing nail 03/26/2011  . Internal hemorrhoids without mention of complication 07/24/36  . Keratosis, actinic 01/28/2012  . Long term (current) use of anticoagulants 03/26/2011  . Long term current use of anticoagulant therapy 07/23/2016  . Lumbago 03/26/2011  . 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 Osteoarthrosis, unspecified whether generalized or localized, unspecified site 11/24/2012  . Other and unspecified hyperlipidemia 03/26/2011  . Other malaise and fatigue 03/26/2011  . Pain in joint, forearm 11/24/2012  . Pain in joint, lower leg 06/15/13   left knee  . Paresthesia of left arm 07/05/2014  . Rectal bleed 06/15/13  . Reflux esophagitis 03/26/2011  . Sacroiliitis, not elsewhere classified ([place]) 05/28/2011  . Scoliosis (and kyphoscoliosis), idiopathic 05/28/2011  . 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 Tinnitus 12/21/2013  . 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 Unspecified essential hypertension 03/26/2011    Past Surgical History:  Procedure Laterality Date  . BASAL CELL CARCINOMA EXCISION Left 12/10/14   Dr. Jarome Matin  . COLONOSCOPY  08/12/2013   Dr. Hilarie Fredrickson adenomatous  polyps  . CRYOTHERAPY Left 2009   hand  . PHOTODYNAMIC THERAPY  09/26/14   face and ear lobs  Dr. Jarome Matin  . SQUAMOUS CELL CARCINOMA EXCISION Left 2010   hand     reports that he has never smoked. He has never used smokeless tobacco. He reports that he  does not drink alcohol or use drugs.  Allergies  Allergen Reactions  . Ciprofloxacin Other (See Comments)    Patient doesn't like the way it makes him feel  . Zestril [Lisinopril]     unknown  . Sulfa Antibiotics Rash    Family History  Problem Relation Age of Onset  . Cancer Mother     colon  . Heart disease Father     MI    Prior to Admission medications   Medication Sig Start Date End Date Taking? Authorizing Provider  amLODipine (NORVASC) 2.5 MG tablet TAKE 1 TABLET DAILY TO CONTROL BLOOD PRESSURE 12/10/16  Yes Estill Dooms, MD  ELIQUIS 5 MG TABS tablet TAKE 1 TABLET TWICE A DAY FOR ANTICOAGULATION 02/10/17  Yes Estill Dooms, MD  esomeprazole (NEXIUM) 20 MG capsule Take 20 mg by mouth daily at 12 noon.   Yes Historical Provider, MD  finasteride (PROSCAR) 5 MG tablet TAKE 1 TABLET ONCE DAILY FOR URINATING DRIBBLING/PROSTATE ENLARGEMENT 06/17/16  Yes Estill Dooms, MD  simvastatin (ZOCOR) 10 MG tablet TAKE 1 TABLET DAILY TO LOWER CHOLESTEROL 09/25/15  Yes Estill Dooms, MD  terazosin (HYTRIN) 2 MG capsule TAKE 1 CAPSULE DAILY 06/21/16  Yes Estill Dooms, MD  TOPROL XL 50 MG 24 hr tablet TAKE 1 TABLET TWICE A DAY TO CONTROL BLOOD PRESSURE AND HEART RHYTHM 08/23/16  Yes Estill Dooms, MD  ranitidine (ZANTAC) 150 MG tablet Take one tablet twice daily to reduce stomach acid Patient not taking: Reported on 03/10/2017 05/14/16   Estill Dooms, MD  valsartan (DIOVAN) 320 MG tablet TAKE 1 TABLET DAILY TO CONTROL BLOOD PRESSURE Patient not taking: Reported on 03/10/2017 06/17/16   Estill Dooms, MD    Physical Exam: Vitals:   03/10/17 0718 03/10/17 0719 03/10/17 0732 03/10/17 0903  BP: (!) 167/87 (!) 154/98  (!) 152/80  Pulse: (!) 102 (!) 102  (!) 102  Resp: 19 18  18   Temp: 100.3 F (37.9 C)     TempSrc: Oral     SpO2: 95% 95%  94%  Weight:   82.1 kg (181 lb)   Height:   5\' 7"  (1.702 m)       Constitutional: NAD, calm, comfortable Vitals:   03/10/17 0718 03/10/17 0719  03/10/17 0732 03/10/17 0903  BP: (!) 167/87 (!) 154/98  (!) 152/80  Pulse: (!) 102 (!) 102  (!) 102  Resp: 19 18  18   Temp: 100.3 F (37.9 C)     TempSrc: Oral     SpO2: 95% 95%  94%  Weight:   82.1 kg (181 lb)   Height:   5\' 7"  (1.702 m)    Eyes: PERRL, lids and conjunctivae normal ENMT: Mucous membranes are moist. Posterior pharynx clear of any exudate or lesions.Normal dentition.  Neck: normal, supple, no masses, no thyromegaly Respiratory: clear to auscultation bilaterally, no wheezing, no crackles. Normal respiratory effort. No accessory muscle use.  Cardiovascular: Regular rate and rhythm, no murmurs / rubs / gallops. No extremity edema. 2+ pedal pulses.  Tachycardic Abdomen: Slight suprapubic tenderness, no masses palpated. Bowel sounds positive.  GU: Right testicular pain with palpation  Musculoskeletal: no clubbing / cyanosis. Normal muscle tone.  Skin: no rashes, lesions, ulcers. No induration Neurologic: CN 2-12 grossly intact. Strength 5/5 in all 4.  Psychiatric: Normal judgment and insight. Alert and oriented x 3. Normal mood.   Labs on Admission: I have personally reviewed following labs and imaging studies  CBC:  Recent Labs Lab 03/10/17 0850  WBC 18.6*  NEUTROABS 16.9*  HGB 13.8  HCT 38.2*  MCV 88.6  PLT 379*   Basic Metabolic Panel:  Recent Labs Lab 03/10/17 0850  NA 136  K 3.6  CL 106  CO2 21*  GLUCOSE 126*  BUN 21*  CREATININE 1.41*  CALCIUM 9.2   GFR: Estimated Creatinine Clearance: 38.6 mL/min (A) (by C-G formula based on SCr of 1.41 mg/dL (H)). Liver Function Tests:  Recent Labs Lab 03/10/17 0850  AST 29  ALT 11*  ALKPHOS 71  BILITOT 1.3*  PROT 7.7  ALBUMIN 3.7   No results for input(s): LIPASE, AMYLASE in the last 168 hours. No results for input(s): AMMONIA in the last 168 hours. Coagulation Profile: No results for input(s): INR, PROTIME in the last 168 hours. Cardiac Enzymes: No results for input(s): CKTOTAL, CKMB,  CKMBINDEX, TROPONINI in the last 168 hours. BNP (last 3 results) No results for input(s): PROBNP in the last 8760 hours. HbA1C: No results for input(s): HGBA1C in the last 72 hours. CBG: No results for input(s): GLUCAP in the last 168 hours. Lipid Profile: No results for input(s): CHOL, HDL, LDLCALC, TRIG, CHOLHDL, LDLDIRECT in the last 72 hours. Thyroid Function Tests: No results for input(s): TSH, T4TOTAL, FREET4, T3FREE, THYROIDAB in the last 72 hours. Anemia Panel: No results for input(s): VITAMINB12, FOLATE, FERRITIN, TIBC, IRON, RETICCTPCT in the last 72 hours. Urine analysis:    Component Value Date/Time   COLORURINE YELLOW 03/10/2017 0950   APPEARANCEUR HAZY (A) 03/10/2017 0950   LABSPEC 1.017 03/10/2017 0950   PHURINE 5.0 03/10/2017 0950   GLUCOSEU NEGATIVE 03/10/2017 0950   HGBUR MODERATE (A) 03/10/2017 0950   BILIRUBINUR NEGATIVE 03/10/2017 0950   KETONESUR 5 (A) 03/10/2017 0950   PROTEINUR 30 (A) 03/10/2017 0950   NITRITE POSITIVE (A) 03/10/2017 0950   LEUKOCYTESUR MODERATE (A) 03/10/2017 0950     Radiological Exams on Admission: No results found.  EKG: EKG not obtained in the emergency room, he is in sinus rhythm on telemetry monitoring  Assessment/Plan Active Problems:   Atrial fibrillation ([facility])   Hyperlipidemia   Essential hypertension   GERD (gastroesophageal reflux disease)   Long term current use of anticoagulant therapy   Sepsis ([place])   Acute lower UTI   Testicular pain   Sepsis due to urinary tract infection -Patient meets criteria for sepsis given fever, tachycardia, leukocytosis as well as an elevated lactic acid -He is was started on ceftriaxone, continue -Blood cultures and urine cultures obtained in the ED  Paroxysmal A. fib -CHADS AVSCscore> 2, he is on chronic Eliquis, continue -Currently is on sinus rhythm on telemetry, will monitor patient on telemetry for next 24 hours and may be discontinued if he remains in sinus  AKI -Likely  in the setting of sepsis, repeat tomorrow morning after IV fluids  Hypertension -Continue home medications  Testicular pain -Obtain an ultrasound  Hyperlipidemia -Continue home statin   DVT prophylaxis: Eliquis Code Status: Full code Family Communication: Discussed with daughter at bedside Disposition Plan: Admit to telemetry Consults called: None    Admission status: Inpatient  At the time of admission, it appears that the  appropriate admission status for this patient is INPATIENT. This is judged to be reasonable and necessary in order to provide the required high service intensity to ensure the patient's safety given the presenting symptoms, physical exam findings, and initial radiographic and laboratory data in the context of their chronic comorbidities. Current circumstances are sepsis, and it is felt to place patient at high risk for further clinical deterioration threatening life, limb, or organ. Moreover, it is my clinical judgment that the patient will require inpatient hospital care spanning beyond 2 midnights from the point of admission and that early discharge would result in unnecessary risk of decompensation and readmission or threat to life, limb or bodily function.   Marzetta Board, MD [facility] Pager [phone]  If 7PM-7AM, please contact night-coverage [email or web address] Password [facility]  03/10/2017, 10:56 AM

## 2017-03-10 NOTE — ED Notes (Signed)
Phillip [name] living

## 2017-03-10 NOTE — ED Notes (Signed)
Bed: UY37 Expected date: 03/10/17 Expected time: 7:04 AM Means of arrival: Ambulance Comments: Abd pain, N/V

## 2017-03-10 NOTE — ED Notes (Signed)
Report given to Floor Rn

## 2017-03-10 NOTE — Progress Notes (Signed)
Pharmacy Antibiotic Note  Phillip [name] is a 83 y.o. male presented to the ED from [facility] on 03/10/17 with c/o n/v and UTI symptoms.  To start ceftriaxone for suspected UTI.   Plan: - ceftriaxone 2 gm IV x1 per MD given in ED, then 1 gm IV q24h - no renal adjustment is required for ceftriaxone.  Pharmacy will sign-off.  Re-consult [place] if need further assistance. _____________________________  Height: 5\' 7"  (170.2 cm) Weight: 181 lb (82.1 kg) IBW/kg (Calculated) : 66.1  Temp (24hrs), Avg:100.3 F (37.9 C), Min:100.3 F (37.9 C), Max:100.3 F (37.9 C)   Recent Labs Lab 03/10/17 0850 03/10/17 0858  WBC 18.6*  --   LATICACIDVEN  --  3.51*    CrCl cannot be calculated (Patient's most recent lab result is older than the maximum 21 days allowed.).    Allergies  Allergen Reactions  . Ciprofloxacin Other (See Comments)    Patient doesn't like the way it makes him feel  . Zestril [Lisinopril]     unknown  . Sulfa Antibiotics Rash     Thank you for allowing pharmacy to be a part of this patient's care.  Lynelle Doctor 03/10/2017 9:22 AM

## 2017-03-10 NOTE — Care Management Note (Signed)
Case Management Note  Patient Details  Name: Phillip [name] MRN: [id number] Date of Birth: 1929/03/23  Subjective/Objective: 83 y/o m admitted w/Sepsis. From [facility]. Pt cons. CSW following.                   Action/Plan:d/c SNF.   Expected Discharge Date:   (UNKNOWN)               Expected Discharge Plan:  Skilled Nursing Facility  In-House Referral:  Clinical Social Work  Discharge planning Services  CM Consult  Post Acute Care Choice:    Choice offered to:     DME Arranged:    DME Agency:     HH Arranged:    Ali Molina Agency:     Status of Service:  In process, will continue to follow  If discussed at Long Length of Stay Meetings, dates discussed:    Additional Comments:  Dessa Phi, RN 03/10/2017, 3:35 PM

## 2017-03-10 NOTE — ED Notes (Signed)
PT unable to provide urine sample at this time

## 2017-03-10 NOTE — ED Triage Notes (Signed)
Patient started having lower abdominal pain with UTI symptoms over the weekend and has become worse since. Patient is febrile.

## 2017-03-11 DIAGNOSIS — Z7901 Long term (current) use of anticoagulants: Secondary | ICD-10-CM

## 2017-03-11 DIAGNOSIS — A419 Sepsis, unspecified organism: Principal | ICD-10-CM

## 2017-03-11 DIAGNOSIS — N453 Epididymo-orchitis: Secondary | ICD-10-CM

## 2017-03-11 LAB — COMPREHENSIVE METABOLIC PANEL
ALBUMIN: 2.7 g/dL — AB (ref 3.5–5.0)
ALK PHOS: 55 U/L (ref 38–126)
ALT: 11 U/L — ABNORMAL LOW (ref 17–63)
ANION GAP: 5 (ref 5–15)
AST: 21 U/L (ref 15–41)
BUN: 20 mg/dL (ref 6–20)
CALCIUM: 8.3 mg/dL — AB (ref 8.9–10.3)
CO2: 21 mmol/L — ABNORMAL LOW (ref 22–32)
Chloride: 113 mmol/L — ABNORMAL HIGH (ref 101–111)
Creatinine, Ser: 1.06 mg/dL (ref 0.61–1.24)
GFR calc Af Amer: >60 mL/min (ref >60)
GFR calc non Af Amer: >60 mL/min (ref >60)
Glucose, Bld: 109 mg/dL — ABNORMAL HIGH (ref 65–99)
POTASSIUM: 3.6 mmol/L (ref 3.5–5.1)
Sodium: 139 mmol/L (ref 135–145)
TOTAL PROTEIN: 5.9 g/dL — AB (ref 6.5–8.1)
Total Bilirubin: 1 mg/dL (ref 0.3–1.2)

## 2017-03-11 LAB — CBC
HCT: 33.4 % — ABNORMAL LOW (ref 39.0–52.0)
HEMATOCRIT: 31.1 % — AB (ref 39.0–52.0)
HEMOGLOBIN: 10.9 g/dL — AB (ref 13.0–17.0)
HEMOGLOBIN: 11.6 g/dL — AB (ref 13.0–17.0)
MCH: 32.1 pg (ref 26.0–34.0)
MCH: 31.7 pg (ref 26.0–34.0)
MCHC: 35 g/dL (ref 30.0–36.0)
MCHC: 34.7 g/dL (ref 30.0–36.0)
MCV: 91.5 fL (ref 78.0–100.0)
MCV: 91.3 fL (ref 78.0–100.0)
Platelets: 133 10*3/uL — ABNORMAL LOW (ref 150–400)
Platelets: 137 10*3/uL — ABNORMAL LOW (ref 150–400)
RBC: 3.4 MIL/uL — AB (ref 4.22–5.81)
RBC: 3.66 MIL/uL — AB (ref 4.22–5.81)
RDW: 13.2 % (ref 11.5–15.5)
RDW: 13.4 % (ref 11.5–15.5)
WBC: 17.7 10*3/uL — ABNORMAL HIGH (ref 4.0–10.5)
WBC: 16.6 10*3/uL — AB (ref 4.0–10.5)

## 2017-03-11 LAB — LACTIC ACID, PLASMA: Lactic Acid, Venous: 1.5 mmol/L (ref 0.5–1.9)

## 2017-03-11 MED ORDER — LEVOFLOXACIN IN D5W 500 MG/100ML IV SOLN
500.0000 mg | INTRAVENOUS | Status: DC
  Administered 2017-03-11 – 2017-03-12 (×2): 500 mg via INTRAVENOUS
  Filled 2017-03-11 (×3): qty 100

## 2017-03-11 NOTE — Progress Notes (Signed)
PROGRESS NOTE  Phillip [name] JXB:[id number] DOB: 07-05-1929 DOA: 03/10/2017 PCP: Jeanmarie Hubert, MD  HPI/Recap of past 55 hours: 83 year old male with past medical history of proximal atrial fibrillation on eliquis and hypertension presented to the emergency room on 3/26 with several days of progressive dysuria frequency and lower abdominal pain as well as pain in his right testicle. No recent trauma. In the emergency room, patient found to be septic with fever, mild tachycardia mild acute kidney injury and white count of 18+ lactic acid level 3.5.  Strong UTI. Patient started on Rocephin and IV fluids. Admitted to hospitalist service for sepsis. Ultrasound of scrotum found no abscess, but findings consistent with right-sided epididymo-orchitis.  This morning, patient feeling a little bit better. Lactic acid level down to 1.5 but white count only down to 17.7. Patient continued to spike fevers. Following findings of ultrasound, antibody exchange to IV Levaquin by 3/27 afternoon. He also complains of rigors  Assessment/Plan: Principal Problem:   Sepsis ([facility]) secondary to Epididymo-orchitis: Antibiotics changed to IV Levaquin for better coverage. Noted urine culture came back for less than 20,000 colonies of Escherichia coli. Repeat lactic acid level tomorrow morning. Follow-up labs. Monitoring blood cultures. Active Problems:   Atrial fibrillation (HCC) on chronic anticoagulation: Continue eliquis. Currently heart rate controlled   Hyperlipidemia   Essential hypertension: Holding antihypertensive secondary to sepsis   GERD (gastroesophageal reflux disease)   Long term current use of anticoagulant therapy  Code Status: Full   Family Communication: Left message for daughter.   Disposition Plan: Patient will be here for several days until infection and sepsis stabilized.    Consultants:  None   Procedures:  None   Antimicrobials:  IV Rocephin 3/26-3/27  IV Levaquin 3/27-present    DVT prophylaxis:  On eliquis   Objective: Vitals:   03/10/17 2102 03/11/17 0421 03/11/17 1105 03/11/17 1424  BP: 119/63 135/77 (!) 143/80 (!) 142/70  Pulse: 63 61 70 85  Resp: 18 20  19   Temp: 97.6 F (36.4 C) 97.6 F (36.4 C)  (!) 100.9 F (38.3 C)  TempSrc: Oral Oral  Oral  SpO2: 98% 97%  96%  Weight:      Height:        Intake/Output Summary (Last 24 hours) at 03/11/17 1549 Last data filed at 03/11/17 1451  Gross per 24 hour  Intake          3821.25 ml  Output                2 ml  Net          3819.25 ml   Filed Weights   03/10/17 0732 03/10/17 1223  Weight: 82.1 kg (181 lb) 82.9 kg (182 lb 12.2 oz)    Exam:   General:  Alert and oriented 3, no acute distress   Cardiovascular: Irregular rhythm, rate controlled   Respiratory: Clear to auscultation bilaterally   Abdomen: Soft, nontender, nondistended, positive bowel sounds  Genitourinary: Scrotum enlarged, tender   Musculoskeletal: No clubbing or cyanosis, trace edema   Skin: Skin breaks, tears or lesions  Psychiatry: Patient is appropriate, no evidence of psychoses    Data Reviewed: CBC:  Recent Labs Lab 03/10/17 0850 03/11/17 0501 03/11/17 1443  WBC 18.6* 17.7* 16.6*  NEUTROABS 16.9*  --   --   HGB 13.8 10.9* 11.6*  HCT 38.2* 31.1* 33.4*  MCV 88.6 91.5 91.3  PLT 136* 133* 130*   Basic Metabolic Panel:  Recent Labs Lab 03/10/17 0850 03/11/17  0501  NA 136 139  K 3.6 3.6  CL 106 113*  CO2 21* 21*  GLUCOSE 126* 109*  BUN 21* 20  CREATININE 1.41* 1.06  CALCIUM 9.2 8.3*   GFR: Estimated Creatinine Clearance: 51.5 mL/min (by C-G formula based on SCr of 1.06 mg/dL). Liver Function Tests:  Recent Labs Lab 03/10/17 0850 03/11/17 0501  AST 29 21  ALT 11* 11*  ALKPHOS 71 55  BILITOT 1.3* 1.0  PROT 7.7 5.9*  ALBUMIN 3.7 2.7*   No results for input(s): LIPASE, AMYLASE in the last 168 hours. No results for input(s): AMMONIA in the last 168 hours. Coagulation Profile: No  results for input(s): INR, PROTIME in the last 168 hours. Cardiac Enzymes: No results for input(s): CKTOTAL, CKMB, CKMBINDEX, TROPONINI in the last 168 hours. BNP (last 3 results) No results for input(s): PROBNP in the last 8760 hours. HbA1C: No results for input(s): HGBA1C in the last 72 hours. CBG: No results for input(s): GLUCAP in the last 168 hours. Lipid Profile: No results for input(s): CHOL, HDL, LDLCALC, TRIG, CHOLHDL, LDLDIRECT in the last 72 hours. Thyroid Function Tests: No results for input(s): TSH, T4TOTAL, FREET4, T3FREE, THYROIDAB in the last 72 hours. Anemia Panel: No results for input(s): VITAMINB12, FOLATE, FERRITIN, TIBC, IRON, RETICCTPCT in the last 72 hours. Urine analysis:    Component Value Date/Time   COLORURINE YELLOW 03/10/2017 0950   APPEARANCEUR HAZY (A) 03/10/2017 0950   LABSPEC 1.017 03/10/2017 0950   PHURINE 5.0 03/10/2017 0950   GLUCOSEU NEGATIVE 03/10/2017 0950   HGBUR MODERATE (A) 03/10/2017 0950   BILIRUBINUR NEGATIVE 03/10/2017 0950   KETONESUR 5 (A) 03/10/2017 0950   PROTEINUR 30 (A) 03/10/2017 0950   NITRITE POSITIVE (A) 03/10/2017 0950   LEUKOCYTESUR MODERATE (A) 03/10/2017 0950   Sepsis Labs: @LABRCNTIP (procalcitonin:4,lacticidven:4)  ) Recent Results (from the past 240 hour(s))  Blood Culture (routine x 2)     Status: None (Preliminary result)   Collection Time: 03/10/17  8:50 AM  Result Value Ref Range Status   Specimen Description BLOOD LEFT ANTECUBITAL  Final   Special Requests BOTTLES DRAWN AEROBIC AND ANAEROBIC 5CC  Final   Culture   Final    NO GROWTH 1 DAY Performed at [facility], [place] [address], [place], [place] [address]    Report Status PENDING  Incomplete  Blood Culture (routine x 2)     Status: None (Preliminary result)   Collection Time: 03/10/17  8:50 AM  Result Value Ref Range Status   Specimen Description BLOOD RIGHT ANTECUBITAL  Final   Special Requests BOTTLES DRAWN AEROBIC AND ANAEROBIC 5CC  Final    Culture   Final    NO GROWTH 1 DAY Performed at [facility], [place] [address], [place], [place] [address]    Report Status PENDING  Incomplete  Urine culture     Status: Abnormal (Preliminary result)   Collection Time: 03/10/17  9:50 AM  Result Value Ref Range Status   Specimen Description URINE, RANDOM  Final   Special Requests NONE  Final   Culture (A)  Final    20,000 COLONIES/mL ESCHERICHIA COLI SUSCEPTIBILITIES TO FOLLOW Performed at [facility], [place] [address], [place], [place] [address]    Report Status PENDING  Incomplete      Studies: No results found.  Scheduled Meds: . amLODipine  2.5 mg Oral Daily  . apixaban  5 mg Oral BID  . chlorhexidine  15 mL Mouth/Throat BID  . famotidine  20 mg Oral Daily  . finasteride  5 mg Oral Daily  . irbesartan  150 mg Oral Daily  . levofloxacin (LEVAQUIN) IV  500 mg Intravenous Q24H  . metoprolol succinate  50 mg Oral BID  . simvastatin  10 mg Oral q1800  . sodium chloride flush  3 mL Intravenous Q12H  . terazosin  2 mg Oral Daily    Continuous Infusions: . sodium chloride 1,000 mL (03/11/17 0830)     LOS: 1 day    Annita Brod, MD [facility] Pager [phone]  If 7PM-7AM, please contact night-coverage [email or web address] Password [facility] 03/11/2017, 3:49 PM

## 2017-03-11 NOTE — Progress Notes (Signed)
PT Cancellation Note  Patient Details Name: [name] MRN: [id number] DOB: 1929/01/02   Cancelled Treatment:    Reason Eval/Treat Not Completed: Fatigue/lethargy limiting ability to participate (patient reports that he just walked to bathroom and is now shaking and cold. Warm blanket provided. will check back another time. )   Marcelino Freestone PT [phone]  03/11/2017, 12:26 PM

## 2017-03-12 ENCOUNTER — Inpatient Hospital Stay (HOSPITAL_COMMUNITY): Payer: Medicare Other

## 2017-03-12 DIAGNOSIS — J9621 Acute and chronic respiratory failure with hypoxia: Secondary | ICD-10-CM

## 2017-03-12 DIAGNOSIS — K219 Gastro-esophageal reflux disease without esophagitis: Secondary | ICD-10-CM

## 2017-03-12 DIAGNOSIS — I10 Essential (primary) hypertension: Secondary | ICD-10-CM

## 2017-03-12 LAB — BASIC METABOLIC PANEL
Anion gap: 5 (ref 5–15)
BUN: 15 mg/dL (ref 6–20)
CHLORIDE: 110 mmol/L (ref 101–111)
CO2: 22 mmol/L (ref 22–32)
Calcium: 7.9 mg/dL — ABNORMAL LOW (ref 8.9–10.3)
Creatinine, Ser: 1.07 mg/dL (ref 0.61–1.24)
Glucose, Bld: 99 mg/dL (ref 65–99)
POTASSIUM: 3.3 mmol/L — AB (ref 3.5–5.1)
SODIUM: 137 mmol/L (ref 135–145)

## 2017-03-12 LAB — CBC
HCT: 30.9 % — ABNORMAL LOW (ref 39.0–52.0)
Hemoglobin: 11 g/dL — ABNORMAL LOW (ref 13.0–17.0)
MCH: 32.3 pg (ref 26.0–34.0)
MCHC: 35.6 g/dL (ref 30.0–36.0)
MCV: 90.6 fL (ref 78.0–100.0)
Platelets: 144 10*3/uL — ABNORMAL LOW (ref 150–400)
RBC: 3.41 MIL/uL — AB (ref 4.22–5.81)
RDW: 13.3 % (ref 11.5–15.5)
WBC: 16.9 10*3/uL — AB (ref 4.0–10.5)

## 2017-03-12 LAB — URINE CULTURE

## 2017-03-12 LAB — BRAIN NATRIURETIC PEPTIDE: B Natriuretic Peptide: 363 pg/mL — ABNORMAL HIGH (ref 0.0–100.0)

## 2017-03-12 LAB — LACTIC ACID, PLASMA: LACTIC ACID, VENOUS: 1.2 mmol/L (ref 0.5–1.9)

## 2017-03-12 MED ORDER — POTASSIUM CHLORIDE CRYS ER 20 MEQ PO TBCR
40.0000 meq | EXTENDED_RELEASE_TABLET | Freq: Once | ORAL | Status: AC
  Administered 2017-03-12: 40 meq via ORAL
  Filled 2017-03-12: qty 2

## 2017-03-12 MED ORDER — SODIUM CHLORIDE 0.9 % IV SOLN
1000.0000 mL | INTRAVENOUS | Status: DC
  Administered 2017-03-12: 1000 mL via INTRAVENOUS

## 2017-03-12 MED ORDER — FUROSEMIDE 10 MG/ML IJ SOLN
40.0000 mg | Freq: Once | INTRAMUSCULAR | Status: AC
  Administered 2017-03-12: 40 mg via INTRAVENOUS
  Filled 2017-03-12: qty 4

## 2017-03-12 MED ORDER — MUSCLE RUB 10-15 % EX CREA
TOPICAL_CREAM | CUTANEOUS | Status: DC | PRN
  Administered 2017-03-12: 1 via TOPICAL
  Filled 2017-03-12: qty 85

## 2017-03-12 NOTE — Progress Notes (Signed)
PROGRESS NOTE  Phillip [name] QIH:[id number] DOB: [birth year] DOA: 03/10/2017 PCP: Jeanmarie Hubert, MD  HPI/Recap of past 82 hours: 83 year old male with past medical history of proximal atrial fibrillation on eliquis and hypertension presented to the emergency room on 3/26 with several days of progressive dysuria, frequency, and lower abdominal pain, as well as pain in his right testicle. No recent trauma. In the emergency room, patient found to be septic with fever, mild tachycardia mild acute kidney injury and white count of 18+ lactic acid level 3.5. Urinalysis was suggestive of UTI, so the patient was started on ceftriaxone and IVF, admitted for sepsis. Ultrasound of scrotum found no abscess, but findings consistent with right-sided epididymo-orchitis. Ceftriaxone was changed to levaquin and leukocytosis has been slowly improving.   This morning, the patient reports stable testicular discomfort, had recurrent fever 3/27 PM, felt rigors.   Assessment/Plan: Sepsis secondary to epididymo-orchitis: Scrotal U/S showed findings most compatible with right-sided epididymo-orchitis and associated small hydrocele without abscess. No blood flow limitation. Normal left testicle. UCx returned with <20k E. coli. Lactate has cleared.  - Continue levaquin (initially started CTX) - Monitoring blood cultures. - Ice pack and tylenol for pain. - Stop IVF given cleared lactate and pulmonary edema. - Monitor fever curve and leukocytosis. If improvement stalls, would consider urology consult.   Hypoxia: Acute overnight into 3/28. CXR showed borderline enlarged cardiac silhouette (on AP film), peribronchial cuffing, and ill-defined perihilar and bibasilar opacities of uncertain etiology.  - Hypoxia resolved, suspect due to pulmonary edema related to volume resuscitation treated with lasix 40mg  x1. Will continue to diurese with that dose. - Check echo and add-on BNP  Atrial fibrillation (HCC) on chronic  anticoagulation: - Continue eliquis.  - Currently heart rate controlled    Hyperlipidemia: Chronic, stable - Simvastatin  Essential hypertension:  - Continue ARB, metoprolol (?on 50mg  BID of metop-succinate, consider consolidating)  GERD: Chronic, stable. - Pepcid   Hypokalemia: Acute, possibly due to loop diuretic.  - Replete and recheck.   Code Status: Full   Family Communication: Left message for daughter.   Disposition Plan: Patient will be here for several days until infection and sepsis improve.  Consultants:  None   Procedures:  None   Antimicrobials:  IV Rocephin 3/26-3/27  IV Levaquin 3/27-present   DVT prophylaxis:  On eliquis  Objective: Vitals:   03/11/17 1849 03/11/17 2119 03/12/17 0527 03/12/17 1440  BP:  (!) 157/81 (!) 154/85 137/76  Pulse:  86 89 69  Resp:  18 18 19   Temp: 99.5 F (37.5 C) 98.4 F (36.9 C) 98.4 F (36.9 C) 98.7 F (37.1 C)  TempSrc:  Oral Oral Oral  SpO2:  98% 91% 94%  Weight:      Height:        Intake/Output Summary (Last 24 hours) at 03/12/17 1655 Last data filed at 03/12/17 1440  Gross per 24 hour  Intake          1784.58 ml  Output              825 ml  Net           959.58 ml   Filed Weights   03/10/17 0732 03/10/17 1223  Weight: 82.1 kg (181 lb) 82.9 kg (182 lb 12.2 oz)    Exam:   General:  Pleasant elderly male in no distress  Cardiovascular: Irregular rhythm, rate controlled   Respiratory: Nonlabored. Mild crackles at bases early this morning.    Abdomen: Soft, nontender, nondistended,  positive bowel sounds  Genitourinary: Scrotum erythematous, edematous, tender with palpably enlarged right testicle and epididymis and mild hydrocele. Left testicle normal. No inguinal lymphadenopathy.    Musculoskeletal: No clubbing or cyanosis, trace edema   Skin: Skin breaks, tears or lesions  Psychiatry: Patient is appropriate, no evidence of psychoses    Data Reviewed: CBC:  Recent Labs Lab  03/10/17 0850 03/11/17 0501 03/11/17 1443 03/12/17 0440  WBC 18.6* 17.7* 16.6* 16.9*  NEUTROABS 16.9*  --   --   --   HGB 13.8 10.9* 11.6* 11.0*  HCT 38.2* 31.1* 33.4* 30.9*  MCV 88.6 91.5 91.3 90.6  PLT 136* 133* 137* 975*   Basic Metabolic Panel:  Recent Labs Lab 03/10/17 0850 03/11/17 0501 03/12/17 0440  NA 136 139 137  K 3.6 3.6 3.3*  CL 106 113* 110  CO2 21* 21* 22  GLUCOSE 126* 109* 99  BUN 21* 20 15  CREATININE 1.41* 1.06 1.07  CALCIUM 9.2 8.3* 7.9*   GFR: Estimated Creatinine Clearance: 51 mL/min (by C-G formula based on SCr of 1.07 mg/dL). Liver Function Tests:  Recent Labs Lab 03/10/17 0850 03/11/17 0501  AST 29 21  ALT 11* 11*  ALKPHOS 71 55  BILITOT 1.3* 1.0  PROT 7.7 5.9*  ALBUMIN 3.7 2.7*   No results for input(s): LIPASE, AMYLASE in the last 168 hours. No results for input(s): AMMONIA in the last 168 hours. Coagulation Profile: No results for input(s): INR, PROTIME in the last 168 hours. Cardiac Enzymes: No results for input(s): CKTOTAL, CKMB, CKMBINDEX, TROPONINI in the last 168 hours. BNP (last 3 results) No results for input(s): PROBNP in the last 8760 hours. HbA1C: No results for input(s): HGBA1C in the last 72 hours. CBG: No results for input(s): GLUCAP in the last 168 hours. Lipid Profile: No results for input(s): CHOL, HDL, LDLCALC, TRIG, CHOLHDL, LDLDIRECT in the last 72 hours. Thyroid Function Tests: No results for input(s): TSH, T4TOTAL, FREET4, T3FREE, THYROIDAB in the last 72 hours. Anemia Panel: No results for input(s): VITAMINB12, FOLATE, FERRITIN, TIBC, IRON, RETICCTPCT in the last 72 hours. Urine analysis:    Component Value Date/Time   COLORURINE YELLOW 03/10/2017 0950   APPEARANCEUR HAZY (A) 03/10/2017 0950   LABSPEC 1.017 03/10/2017 0950   PHURINE 5.0 03/10/2017 0950   GLUCOSEU NEGATIVE 03/10/2017 0950   HGBUR MODERATE (A) 03/10/2017 0950   BILIRUBINUR NEGATIVE 03/10/2017 0950   KETONESUR 5 (A) 03/10/2017 0950    PROTEINUR 30 (A) 03/10/2017 0950   NITRITE POSITIVE (A) 03/10/2017 0950   LEUKOCYTESUR MODERATE (A) 03/10/2017 0950   Sepsis Labs: @LABRCNTIP (procalcitonin:4,lacticidven:4)  ) Recent Results (from the past 240 hour(s))  Blood Culture (routine x 2)     Status: None (Preliminary result)   Collection Time: 03/10/17  8:50 AM  Result Value Ref Range Status   Specimen Description BLOOD LEFT ANTECUBITAL  Final   Special Requests BOTTLES DRAWN AEROBIC AND ANAEROBIC 5CC  Final   Culture   Final    NO GROWTH 2 DAYS Performed at [facility], [place] [address] [place], [place] [address]    Report Status PENDING  Incomplete  Blood Culture (routine x 2)     Status: None (Preliminary result)   Collection Time: 03/10/17  8:50 AM  Result Value Ref Range Status   Specimen Description BLOOD RIGHT ANTECUBITAL  Final   Special Requests BOTTLES DRAWN AEROBIC AND ANAEROBIC 5CC  Final   Culture   Final    NO GROWTH 2 DAYS Performed at  [facility], [place] [address], [place], [place] [address]    Report Status PENDING  Incomplete  Urine culture     Status: Abnormal   Collection Time: 03/10/17  9:50 AM  Result Value Ref Range Status   Specimen Description URINE, RANDOM  Final   Special Requests NONE  Final   Culture 20,000 COLONIES/mL ESCHERICHIA COLI (A)  Final   Report Status 03/12/2017 FINAL  Final   Organism ID, Bacteria ESCHERICHIA COLI (A)  Final      Susceptibility   Escherichia coli - MIC*    AMPICILLIN 4 SENSITIVE Sensitive     CEFAZOLIN <=4 SENSITIVE Sensitive     CEFTRIAXONE <=1 SENSITIVE Sensitive     CIPROFLOXACIN <=0.25 SENSITIVE Sensitive     GENTAMICIN <=1 SENSITIVE Sensitive     IMIPENEM <=0.25 SENSITIVE Sensitive     NITROFURANTOIN <=16 SENSITIVE Sensitive     TRIMETH/SULFA <=20 SENSITIVE Sensitive     AMPICILLIN/SULBACTAM <=2 SENSITIVE Sensitive     PIP/TAZO <=4 SENSITIVE Sensitive     Extended ESBL NEGATIVE Sensitive     * 20,000 COLONIES/mL ESCHERICHIA COLI       Studies: Dg Chest Port 1 View  Result Date: 03/12/2017 CLINICAL DATA:  Wheezing.  Bibasilar crackles.  Shortness of breath. EXAM: PORTABLE CHEST 1 VIEW COMPARISON:  None. FINDINGS: Heart size at the upper limits normal. Ill-defined perihilar and bibasilar opacities, right greater than left. Mild peribronchial cuffing. No evidence of pleural effusion on single-view. No pneumothorax. Degenerative change of both shoulders. IMPRESSION: Borderline cardiomegaly. There is peribronchial cuffing. Ill-defined perihilar and bibasilar opacities may be pulmonary edema, atelectasis, pneumonia or aspiration. Electronically Signed   By: Jeb Levering M.D.   On: 03/12/2017 06:17    Scheduled Meds: . amLODipine  2.5 mg Oral Daily  . apixaban  5 mg Oral BID  . chlorhexidine  15 mL Mouth/Throat BID  . famotidine  20 mg Oral Daily  . finasteride  5 mg Oral Daily  . irbesartan  150 mg Oral Daily  . levofloxacin (LEVAQUIN) IV  500 mg Intravenous Q24H  . metoprolol succinate  50 mg Oral BID  . simvastatin  10 mg Oral q1800  . sodium chloride flush  3 mL Intravenous Q12H  . terazosin  2 mg Oral Daily   Continuous Infusions:   LOS: 2 days   Vance Gather, MD [facility] Pager [phone]  If 7PM-7AM, please contact night-coverage [email or web address] Password [facility] 03/12/2017, 4:55 PM

## 2017-03-12 NOTE — Progress Notes (Signed)
Patient is having SOB. Wet wheezes. Oxygen SAT s 88% on room air. Patient was placed on 2 L /min [place]. Respiratory tech assessed patient.  PCP was notified.

## 2017-03-12 NOTE — Progress Notes (Signed)
qPhysical Therapy Treatment Patient Details Name: Phillip [name] MRN: [id number] DOB: 04/26/1929 Today's Date: 03/12/2017    History of Present Illness Pt admitted with abd pain, N/V, and sepsis 2* Epididymo-Orchitis.  Pt with hx of TIA, Tinnitis and A-fib    PT Comments    Pt motivated but continues unsteady and experiencing LOB ambulating sans AD and with SPC.  Pt investigating if has RW available at home.   Follow Up Recommendations  No PT follow up     Equipment Recommendations  None recommended by PT    Recommendations for Other Services       Precautions / Restrictions Precautions Precautions: Fall Restrictions Weight Bearing Restrictions: No    Mobility  Bed Mobility Overal bed mobility: Modified Independent             General bed mobility comments: Pt unassisted sit to supine  Transfers Overall transfer level: Needs assistance Equipment used: None Transfers: Sit to/from Stand Sit to Stand: Supervision            Ambulation/Gait Ambulation/Gait assistance: Min assist;Min guard;Supervision Ambulation Distance (Feet): 450 Feet Assistive device: None;1 person hand held assist;Rolling walker (2 wheeled);Straight cane Gait Pattern/deviations: Step-through pattern;Shuffle;Trunk flexed;Wide base of support     General Gait Details: Pt demonstrating mild instability and several LOB requiring min assist to correct while using SPC.  No LOB with RW.  Pt ambulated 150 with cane and additional 200' with RW and cues for posture and position from [employer]            Wheelchair Mobility    Modified Rankin (Stroke Patients Only)       Balance Overall balance assessment: Needs assistance Sitting-balance support: No upper extremity supported;Feet supported Sitting balance-Leahy Scale: Good     Standing balance support: No upper extremity supported Standing balance-Leahy Scale: Fair                              Cognition  Arousal/Alertness: Awake/alert Behavior During Therapy: WFL for tasks assessed/performed Overall Cognitive Status: Within Functional Limits for tasks assessed                                        Exercises      General Comments        Pertinent Vitals/Pain Pain Assessment: Faces Faces Pain Scale: Hurts little more Pain Location: Groin with certain movements Pain Descriptors / Indicators: Grimacing;Guarding Pain Intervention(s): Limited activity within patient's tolerance;Monitored during session    Home Living                      Prior Function            PT Goals (current goals can now be found in the care plan section) Acute Rehab PT Goals Patient Stated Goal: Regain IND to continue to care for spouse PT Goal Formulation: With patient Time For Goal Achievement: 03/22/17 Potential to Achieve Goals: Good Progress towards PT goals: Progressing toward goals    Frequency    Min 3X/week      PT Plan Current plan remains appropriate    Co-evaluation             End of Session Equipment Utilized During Treatment: Gait belt Activity Tolerance: Patient tolerated treatment well Patient left: in bed;with call bell/phone within reach;with bed  alarm set Nurse Communication: Mobility status PT Visit Diagnosis: Unsteadiness on feet (R26.81)     Time: 7493-5521 PT Time Calculation (min) (ACUTE ONLY): 28 min  Charges:  $Gait Training: 8-22 mins                    G Codes:       PG [phone]   Kalev Temme 03/12/2017, 2:30 PM

## 2017-03-12 NOTE — Progress Notes (Signed)
CSW contacted [facility] facility to confirm patient's residency, facility reported that patient lives in their independent apartments.   CSW spoke with patient at bedside, regarding PT recommendations. Patient reports that he resides with wife and feels safe returning home. Patient to return home with wife at discharge.   Abundio Miu, [place] Social Worker [facility] Cell#: [phone]

## 2017-03-12 NOTE — Evaluation (Signed)
Physical Therapy Evaluation Patient Details Name: Phillip [name] MRN: [id number] DOB: 09-25-1929 Today's Date: 03/12/2017   History of Present Illness  Pt admitted with abd pain, N/V, and sepsis 2* Epididymo-Orchitis.  Pt with hx of TIA, Tinnitis and A-fib  Clinical Impression  Pt admitted as above and presenting with functional mobility limitations 2* ambulatory balance deficits 2* to compensations related to current condition.  Pt should progress to dc home with spouse.    Follow Up Recommendations No PT follow up    Equipment Recommendations  None recommended by PT    Recommendations for Other Services       Precautions / Restrictions Precautions Precautions: Fall Restrictions Weight Bearing Restrictions: No      Mobility  Bed Mobility Overal bed mobility: Modified Independent             General bed mobility comments: Pt unassisted supine to sit  Transfers Overall transfer level: Needs assistance Equipment used: None Transfers: Sit to/from Stand Sit to Stand: Min guard         General transfer comment: To steady on initial standing  Ambulation/Gait Ambulation/Gait assistance: Min assist;Min guard Ambulation Distance (Feet): 400 Feet Assistive device: None;1 person hand held assist Gait Pattern/deviations: Step-through pattern;Shuffle;Trunk flexed;Wide base of support     General Gait Details: Pt demonstrating mild instability and several LOB requiring min assist to correct  Stairs            Wheelchair Mobility    Modified Rankin (Stroke Patients Only)       Balance Overall balance assessment: Needs assistance Sitting-balance support: No upper extremity supported;Feet supported Sitting balance-Leahy Scale: Good     Standing balance support: No upper extremity supported Standing balance-Leahy Scale: Fair                               Pertinent Vitals/Pain Pain Assessment: No/denies pain    Home Living Family/patient  expects to be discharged to:: Private residence Living Arrangements: Spouse/significant other Available Help at Discharge: Family (spouse is in early stages of Alzheimers) Type of Home: Mobile home Home Access: Level entry     Home Layout: One level Home Equipment: Cane - single point      Prior Function Level of Independence: Independent               Hand Dominance        Extremity/Trunk Assessment   Upper Extremity Assessment Upper Extremity Assessment: Overall WFL for tasks assessed    Lower Extremity Assessment Lower Extremity Assessment: Overall WFL for tasks assessed    Cervical / Trunk Assessment Cervical / Trunk Assessment: Normal  Communication   Communication: HOH  Cognition Arousal/Alertness: Awake/alert Behavior During Therapy: WFL for tasks assessed/performed Overall Cognitive Status: Within Functional Limits for tasks assessed                                        General Comments      Exercises     Assessment/Plan    PT Assessment Patient needs continued PT services  PT Problem List Decreased balance;Decreased mobility;Decreased knowledge of use of DME       PT Treatment Interventions DME instruction;Gait training;Functional mobility training;Therapeutic activities;Therapeutic exercise;Balance training;Patient/family education    PT Goals (Current goals can be found in the Care Plan section)  Acute Rehab PT Goals Patient Stated  Goal: Regain IND to continue to care for spouse PT Goal Formulation: With patient Time For Goal Achievement: 03/22/17 Potential to Achieve Goals: Good    Frequency Min 3X/week   Barriers to discharge Decreased caregiver support Spouse with early dementia    Co-evaluation               End of Session Equipment Utilized During Treatment: Gait belt Activity Tolerance: Patient tolerated treatment well Patient left: in chair;with call bell/phone within reach;with chair alarm  set Nurse Communication: Mobility status PT Visit Diagnosis: Unsteadiness on feet (R26.81)    Time: 7253-6644 PT Time Calculation (min) (ACUTE ONLY): 25 min   Charges:   PT Evaluation $PT Eval Low Complexity: 1 Procedure PT Treatments $Gait Training: 8-22 mins   PT G Codes:        Pg [phone]  Marysol Wellnitz 03/12/2017, 11:05 AM

## 2017-03-12 NOTE — Progress Notes (Signed)
Called to bedside by RN to assess. Auscultation reveals coarse wet wheezing. BD not indicated at this time. Fluids currently going at >100. Suggest decrease fluids, page on call for diuretic. VSS on 2 liter O2 via nasal canula. Re-instructed incentive spirometer.

## 2017-03-13 ENCOUNTER — Inpatient Hospital Stay (HOSPITAL_COMMUNITY): Payer: Medicare Other

## 2017-03-13 DIAGNOSIS — J80 Acute respiratory distress syndrome: Secondary | ICD-10-CM

## 2017-03-13 LAB — BASIC METABOLIC PANEL
Anion gap: 9 (ref 5–15)
BUN: 16 mg/dL (ref 6–20)
CHLORIDE: 105 mmol/L (ref 101–111)
CO2: 25 mmol/L (ref 22–32)
Calcium: 8.6 mg/dL — ABNORMAL LOW (ref 8.9–10.3)
Creatinine, Ser: 1.05 mg/dL (ref 0.61–1.24)
GFR calc Af Amer: >60 mL/min (ref >60)
GFR calc non Af Amer: >60 mL/min (ref >60)
GLUCOSE: 90 mg/dL (ref 65–99)
POTASSIUM: 3.4 mmol/L — AB (ref 3.5–5.1)
Sodium: 139 mmol/L (ref 135–145)

## 2017-03-13 LAB — CBC
HCT: 32.5 % — ABNORMAL LOW (ref 39.0–52.0)
HEMOGLOBIN: 11.3 g/dL — AB (ref 13.0–17.0)
MCH: 30.8 pg (ref 26.0–34.0)
MCHC: 34.8 g/dL (ref 30.0–36.0)
MCV: 88.6 fL (ref 78.0–100.0)
Platelets: 153 10*3/uL (ref 150–400)
RBC: 3.67 MIL/uL — AB (ref 4.22–5.81)
RDW: 13 % (ref 11.5–15.5)
WBC: 11.4 10*3/uL — ABNORMAL HIGH (ref 4.0–10.5)

## 2017-03-13 LAB — ECHOCARDIOGRAM COMPLETE
Height: 67 in
Weight: 2924.18 oz

## 2017-03-13 MED ORDER — IBUPROFEN 200 MG PO TABS: 200.0000 mg | ORAL_TABLET | Freq: Four times a day (QID) | ORAL | 0 refills | Status: DC | PRN

## 2017-03-13 MED ORDER — LEVOFLOXACIN 500 MG PO TABS: 500.0000 mg | ORAL_TABLET | Freq: Every day | ORAL | 0 refills | Status: AC

## 2017-03-13 MED ORDER — ACETAMINOPHEN 325 MG PO TABS: 650.0000 mg | ORAL_TABLET | Freq: Four times a day (QID) | ORAL | 0 refills | Status: DC | PRN

## 2017-03-13 NOTE — Progress Notes (Signed)
  Echocardiogram 2D Echocardiogram has been performed.  Tresa Res 03/13/2017, 11:36 AM

## 2017-03-13 NOTE — Progress Notes (Signed)
qPhysical Therapy Treatment Patient Details Name: Phillip [name] MRN: [id number] DOB: 12-18-1928 Today's Date: 03/13/2017    History of Present Illness Pt admitted with abd pain, N/V, and sepsis 2* Epididymo-Orchitis.  Pt with hx of TIA, Tinnitis and A-fib    PT Comments    Pt demonstrating marked improvement in stability ambulating sans AD with one episode mild balance loss when stepping bkwd.  Pt eager to return home with spouse and states Dtr will be with until Sunday and will borrow RW from friend in case stability deteriorates again.   Follow Up Recommendations  No PT follow up     Equipment Recommendations  None recommended by PT    Recommendations for Other Services       Precautions / Restrictions Precautions Precautions: Fall Restrictions Weight Bearing Restrictions: No    Mobility  Bed Mobility Overal bed mobility: Modified Independent                Transfers Overall transfer level: Needs assistance Equipment used: None Transfers: Sit to/from Stand Sit to Stand: Supervision         General transfer comment: min cues for use of UEs  Ambulation/Gait Ambulation/Gait assistance: Min guard;Supervision Ambulation Distance (Feet): 450 Feet (and 20' to bathroom) Assistive device: Rolling walker (2 wheeled);Straight cane;None Gait Pattern/deviations: Step-through pattern;Shuffle;Trunk flexed;Wide base of support Gait velocity: decr Gait velocity interpretation: Below normal speed for age/gender General Gait Details: Pt demonstrating increased stability this date with decreased BOS vs yesterday.  One episode balance mild balance loss with stepping bkwards.  Pt agreeable to borrow friends RW to have on hand if stability deteriorates   Stairs            Wheelchair Mobility    Modified Rankin (Stroke Patients Only)       Balance Overall balance assessment: Needs assistance Sitting-balance support: No upper extremity supported;Feet  supported Sitting balance-Leahy Scale: Good     Standing balance support: No upper extremity supported Standing balance-Leahy Scale: Fair                              Cognition Arousal/Alertness: Awake/alert Behavior During Therapy: WFL for tasks assessed/performed Overall Cognitive Status: Within Functional Limits for tasks assessed                                        Exercises      General Comments        Pertinent Vitals/Pain Pain Assessment: No/denies pain Pain Intervention(s): Limited activity within patient's tolerance;Monitored during session    Home Living                      Prior Function            PT Goals (current goals can now be found in the care plan section) Acute Rehab PT Goals Patient Stated Goal: Regain IND to continue to care for spouse PT Goal Formulation: With patient Time For Goal Achievement: 03/22/17 Potential to Achieve Goals: Good Progress towards PT goals: Progressing toward goals    Frequency    Min 3X/week      PT Plan Current plan remains appropriate    Co-evaluation             End of Session Equipment Utilized During Treatment: Gait belt Activity Tolerance: Patient tolerated treatment well Patient  left: in bed;with call bell/phone within reach;with chair alarm set;with family/visitor present Nurse Communication: Mobility status PT Visit Diagnosis: Unsteadiness on feet (R26.81)     Time: 3875-6433 PT Time Calculation (min) (ACUTE ONLY): 32 min  Charges:  $Gait Training: 23-37 mins                    G Codes:       [id number]   Damario Gillie 03/13/2017, 1:02 PM

## 2017-03-13 NOTE — Discharge Summary (Addendum)
Physician Discharge Summary  Davidjames Blansett XTG:[id number] DOB: 1929-12-13 DOA: 03/10/2017  PCP: Jeanmarie Hubert, MD  Admit date: 03/10/2017 Discharge date: 03/13/2017  Admitted From: Home  Disposition:  Home  Recommendations for Outpatient Follow-up:  1. Follow up with PCP in 1-weeks 2. Patient placed on levofloxacin daily for the next 10 days.  3. Pending echocardiogram report.   Home Health: No  Equipment/Devices: No   Discharge Condition: Stable  CODE STATUS: Full  Diet recommendation: Heart Healthy   Brief/Interim Summary: This is a 83 year old male who presented to the hospital with the chief complain of chills, lower abdominal pain and dysuria. Symptoms were progressive for last 2 days prior to hospitalization, associated with pain in the right testicle. On initial physical examination blood pressure 167/87, heart rate 102, respiratory rate 18, oxygen saturation 95%. His mucous membranes were moist, his lungs were clear to auscultation, heart S1-S2 present rhythmic, his abdomen was tender at the suprapubic region, positive right testicular pain on palpation. Sodium 136, potassium 3.6, chloride 106, bicarbonate 21, glucose 126, BUN 21, creatinine 1.41, white count 18.6, hemoglobin 13.8, hematocrit 38.2, platelets 136, lactic acid 3.5, urinalysis with too numerous to count white cells, positive nitrates. Testicular ultrasound showed findings consistent with right-sided epididymoorchitis, no abscesses was observed, positive small right-sided hydrocele. Left testicle and epididymis were normal.  The patient was admitted to the hospital working diagnosis of sepsis due to right-sided epididymo-orchitis/ urinary tract infection (present on admission).  1. Severe sepsis due to epididymoorchitis. Patient was admitted to the medical floor on remote telemetry monitor, he received IV fluids and IV antibiotics with ceftriaxone. Blood cultures were no growth 2, urine culture was positive for Escherichia  coli, 20,000 colony-forming units, sensitive to cephalosporins and fluoroquinolones. Clinical scenario suggesting epididymoorchitis more than urinary tract infection as a source of infection (urinary tract infection was ruled out). Antibiotics were changed to levofloxacin intravenously, he will take 7 more days of levofloxacin orally to complete a 10 day course. Patient control with ibuprofen and acetaminophen.   2. Pulmonary edema due to volume overload with acute respiratory failure. As a consequence of aggressive fluid resuscitation, patient developed pulmonary edema that was confirmed by x-ray. Symptoms improved remarkably with IV furosemide. Discharge oxygen saturation 92% on room air. Echocardiogram report is pending.  3. Acute kidney injury due to sepsis. Patient received IV fluids improvement of kidney function. Discharge creatinine 1.0 with normal electrolytes.   4. Chronic paroxysmal atrial fibrillation. Telemetry showed a sinus rhythm with PACs and PVCs. Patient was continued on apixaban for anticoagulation. Rate control with metoprolol.  5. Hypertension. Continue blood pressure control with amlodipine and irbesartan. Discharge blood pressure 270-350 systolic.  6. BPH. Continue finasteride and terazosin.  Discharge Diagnoses:  Principal Problem:   Sepsis ([place]) Active Problems:   Atrial fibrillation ([place])   Hyperlipidemia   Essential hypertension   GERD (gastroesophageal reflux disease)   Long term current use of anticoagulant therapy   Acute lower UTI   Testicular pain   Epididymoorchitis    Discharge Instructions   Allergies as of 03/13/2017      Reactions   Ciprofloxacin Other (See Comments)   Patient doesn't like the way it makes him feel   Zestril [lisinopril]    unknown   Sulfa Antibiotics Rash      Medication List    STOP taking these medications   ranitidine 150 MG tablet Commonly known as:  ZANTAC     TAKE these medications   acetaminophen 325 MG  tablet  Commonly known as:  TYLENOL Take 2 tablets (650 mg total) by mouth every 6 (six) hours as needed for mild pain (or Fever >/= 101).   amLODipine 2.5 MG tablet Commonly known as:  NORVASC TAKE 1 TABLET DAILY TO CONTROL BLOOD PRESSURE   ELIQUIS 5 MG Tabs tablet Generic drug:  apixaban TAKE 1 TABLET TWICE A DAY FOR ANTICOAGULATION   esomeprazole 20 MG capsule Commonly known as:  NEXIUM Take 20 mg by mouth daily at 12 noon.   finasteride 5 MG tablet Commonly known as:  PROSCAR TAKE 1 TABLET ONCE DAILY FOR URINATING DRIBBLING/PROSTATE ENLARGEMENT   ibuprofen 200 MG tablet Commonly known as:  MOTRIN IB Take 1 tablet (200 mg total) by mouth every 6 (six) hours as needed.   levofloxacin 500 MG tablet Commonly known as:  LEVAQUIN Take 1 tablet (500 mg total) by mouth daily.   simvastatin 10 MG tablet Commonly known as:  ZOCOR TAKE 1 TABLET DAILY TO LOWER CHOLESTEROL   terazosin 2 MG capsule Commonly known as:  HYTRIN TAKE 1 CAPSULE DAILY   TOPROL XL 50 MG 24 hr tablet Generic drug:  metoprolol succinate TAKE 1 TABLET TWICE A DAY TO CONTROL BLOOD PRESSURE AND HEART RHYTHM   valsartan 320 MG tablet Commonly known as:  DIOVAN TAKE 1 TABLET DAILY TO CONTROL BLOOD PRESSURE       Allergies  Allergen Reactions  . Ciprofloxacin Other (See Comments)    Patient doesn't like the way it makes him feel  . Zestril [Lisinopril]     unknown  . Sulfa Antibiotics Rash    Consultations:     Procedures/Studies: US Scrotum  Result Date: 03/10/2017 CLINICAL DATA:  Right-sided scrotal swelling for the past 5 days EXAM: SCROTAL ULTRASOUND DOPPLER ULTRASOUND OF THE TESTICLES TECHNIQUE: Complete ultrasound examination of the testicles, epididymis, and other scrotal structures was performed. Color and spectral Doppler ultrasound were also utilized to evaluate blood flow to the testicles. COMPARISON:  None in PACs FINDINGS: Right testicle Measurements: 5.0 x 3.0 x 3.3 cm. The  parenchymal echotexture is mildly heterogeneous. There is no discrete mass. Left testicle Measurements: 4.1 x 1.9 x 2.8 cm. The parenchymal echotexture is heterogeneous. There is no discrete mass. Right epididymis: There is mild prominence of the epididymis with increased vascularity. There is slightly increased vascularity adjacent right testicle. Left epididymis:  Normal in size and appearance. Hydrocele:  There is a small hydrocele on the right. Varicocele:  None visualized. Pulsed Doppler interrogation of both testes demonstrates normal low resistance arterial and venous waveforms bilaterally. IMPRESSION: Findings most compatible with right-sided epididymo-orchitis. No abscess is observed. There is a small right-sided hydrocele. Normal appearance of the left testicle and epididymis. Electronically Signed   By: David  [place]ique M.D.   On: 03/10/2017 11:45   [place] Art/ven Flow Abd Pelv Doppler  Result Date: 03/10/2017 CLINICAL DATA:  Right-sided scrotal swelling for the past 5 days EXAM: SCROTAL ULTRASOUND DOPPLER ULTRASOUND OF THE TESTICLES TECHNIQUE: Complete ultrasound examination of the testicles, epididymis, and other scrotal structures was performed. Color and spectral Doppler ultrasound were also utilized to evaluate blood flow to the testicles. COMPARISON:  None in PACs FINDINGS: Right testicle Measurements: 5.0 x 3.0 x 3.3 cm. The parenchymal echotexture is mildly heterogeneous. There is no discrete mass. Left testicle Measurements: 4.1 x 1.9 x 2.8 cm. The parenchymal echotexture is heterogeneous. There is no discrete mass. Right epididymis: There is mild prominence of the epididymis with increased vascularity. There is slightly increased vascularity adjacent right  testicle. Left epididymis:  Normal in size and appearance. Hydrocele:  There is a small hydrocele on the right. Varicocele:  None visualized. Pulsed Doppler interrogation of both testes demonstrates normal low resistance arterial and venous  waveforms bilaterally. IMPRESSION: Findings most compatible with right-sided epididymo-orchitis. No abscess is observed. There is a small right-sided hydrocele. Normal appearance of the left testicle and epididymis. Electronically Signed   By: David  [place] M.D.   On: 03/10/2017 11:45   Dg Chest Port 1 View  Result Date: 03/12/2017 CLINICAL DATA:  Wheezing.  Bibasilar crackles.  Shortness of breath. EXAM: PORTABLE CHEST 1 VIEW COMPARISON:  None. FINDINGS: Heart size at the upper limits normal. Ill-defined perihilar and bibasilar opacities, right greater than left. Mild peribronchial cuffing. No evidence of pleural effusion on single-view. No pneumothorax. Degenerative change of both shoulders. IMPRESSION: Borderline cardiomegaly. There is peribronchial cuffing. Ill-defined perihilar and bibasilar opacities may be pulmonary edema, atelectasis, pneumonia or aspiration. Electronically Signed   By: Jeb Levering M.D.   On: 03/12/2017 06:17        Subjective: Patient with improved pain and edema, on the scrotum, no nausea or vomiting, ambulating with no major problems.   Discharge Exam: Vitals:   03/13/17 0851 03/13/17 1000  BP: (!) 168/98 (!) 181/85  Pulse:  76  Resp:    Temp:     Vitals:   03/13/17 0510 03/13/17 0818 03/13/17 0851 03/13/17 1000  BP: (!) 160/67 (!) 167/100 (!) 168/98 (!) 181/85  Pulse: 70 69  76  Resp: 18     Temp: 98.4 F (36.9 C)     TempSrc: Oral     SpO2: 92%     Weight:      Height:        General: Pt is alert, awake, not in acute distress Cardiovascular: RRR, S1/S2 +, no rubs, no gallops Respiratory: CTA bilaterally, no wheezing, no rhonchi Abdominal: Soft, NT, ND, bowel sounds + Extremities: no edema, no cyanosis Positive scrotal edema and erythema, mild tender to palpation.     The results of significant diagnostics from this hospitalization (including imaging, microbiology, ancillary and laboratory) are listed below for reference.      Microbiology: Recent Results (from the past 240 hour(s))  Blood Culture (routine x 2)     Status: None (Preliminary result)   Collection Time: 03/10/17  8:50 AM  Result Value Ref Range Status   Specimen Description BLOOD LEFT ANTECUBITAL  Final   Special Requests BOTTLES DRAWN AEROBIC AND ANAEROBIC 5CC  Final   Culture   Final    NO GROWTH 2 DAYS Performed at [facility], [address] [place], [place] [address]    Report Status PENDING  Incomplete  Blood Culture (routine x 2)     Status: None (Preliminary result)   Collection Time: 03/10/17  8:50 AM  Result Value Ref Range Status   Specimen Description BLOOD RIGHT ANTECUBITAL  Final   Special Requests BOTTLES DRAWN AEROBIC AND ANAEROBIC 5CC  Final   Culture   Final    NO GROWTH 2 DAYS Performed at [facility], [place] [address], [place], [place] [id number]    Report Status PENDING  Incomplete  Urine culture     Status: Abnormal   Collection Time: 03/10/17  9:50 AM  Result Value Ref Range Status   Specimen Description URINE, RANDOM  Final   Special Requests NONE  Final   Culture 20,000 COLONIES/mL ESCHERICHIA COLI (A)  Final   Report  Status 03/12/2017 FINAL  Final   Organism ID, Bacteria ESCHERICHIA COLI (A)  Final      Susceptibility   Escherichia coli - MIC*    AMPICILLIN 4 SENSITIVE Sensitive     CEFAZOLIN <=4 SENSITIVE Sensitive     CEFTRIAXONE <=1 SENSITIVE Sensitive     CIPROFLOXACIN <=0.25 SENSITIVE Sensitive     GENTAMICIN <=1 SENSITIVE Sensitive     IMIPENEM <=0.25 SENSITIVE Sensitive     NITROFURANTOIN <=16 SENSITIVE Sensitive     TRIMETH/SULFA <=20 SENSITIVE Sensitive     AMPICILLIN/SULBACTAM <=2 SENSITIVE Sensitive     PIP/TAZO <=4 SENSITIVE Sensitive     Extended ESBL NEGATIVE Sensitive     * 20,000 COLONIES/mL ESCHERICHIA COLI     Labs: BNP (last 3 results)  Recent Labs  03/12/17 1834  BNP 585.2*   Basic Metabolic Panel:  Recent Labs Lab 03/10/17 0850 03/11/17 0501  03/12/17 0440 03/13/17 0444  NA 136 139 137 139  K 3.6 3.6 3.3* 3.4*  CL 106 113* 110 105  CO2 21* 21* 22 25  GLUCOSE 126* 109* 99 90  BUN 21* 20 15 16   CREATININE 1.41* 1.06 1.07 1.05  CALCIUM 9.2 8.3* 7.9* 8.6*   Liver Function Tests:  Recent Labs Lab 03/10/17 0850 03/11/17 0501  AST 29 21  ALT 11* 11*  ALKPHOS 71 55  BILITOT 1.3* 1.0  PROT 7.7 5.9*  ALBUMIN 3.7 2.7*   No results for input(s): LIPASE, AMYLASE in the last 168 hours. No results for input(s): AMMONIA in the last 168 hours. CBC:  Recent Labs Lab 03/10/17 0850 03/11/17 0501 03/11/17 1443 03/12/17 0440 03/13/17 0444  WBC 18.6* 17.7* 16.6* 16.9* 11.4*  NEUTROABS 16.9*  --   --   --   --   HGB 13.8 10.9* 11.6* 11.0* 11.3*  HCT 38.2* 31.1* 33.4* 30.9* 32.5*  MCV 88.6 91.5 91.3 90.6 88.6  PLT 136* 133* 137* 144* 153   Cardiac Enzymes: No results for input(s): CKTOTAL, CKMB, CKMBINDEX, TROPONINI in the last 168 hours. BNP: Invalid input(s): POCBNP CBG: No results for input(s): GLUCAP in the last 168 hours. D-Dimer No results for input(s): DDIMER in the last 72 hours. Hgb A1c No results for input(s): HGBA1C in the last 72 hours. Lipid Profile No results for input(s): CHOL, HDL, LDLCALC, TRIG, CHOLHDL, LDLDIRECT in the last 72 hours. Thyroid function studies No results for input(s): TSH, T4TOTAL, T3FREE, THYROIDAB in the last 72 hours.  Invalid input(s): FREET3 Anemia work up No results for input(s): VITAMINB12, FOLATE, FERRITIN, TIBC, IRON, RETICCTPCT in the last 72 hours. Urinalysis    Component Value Date/Time   COLORURINE YELLOW 03/10/2017 0950   APPEARANCEUR HAZY (A) 03/10/2017 0950   LABSPEC 1.017 03/10/2017 0950   PHURINE 5.0 03/10/2017 0950   GLUCOSEU NEGATIVE 03/10/2017 0950   HGBUR MODERATE (A) 03/10/2017 0950   BILIRUBINUR NEGATIVE 03/10/2017 0950   KETONESUR 5 (A) 03/10/2017 0950   PROTEINUR 30 (A) 03/10/2017 0950   NITRITE POSITIVE (A) 03/10/2017 0950   LEUKOCYTESUR MODERATE  (A) 03/10/2017 0950   Sepsis Labs Invalid input(s): PROCALCITONIN,  WBC,  LACTICIDVEN Microbiology Recent Results (from the past 240 hour(s))  Blood Culture (routine x 2)     Status: None (Preliminary result)   Collection Time: 03/10/17  8:50 AM  Result Value Ref Range Status   Specimen Description BLOOD LEFT ANTECUBITAL  Final   Special Requests BOTTLES DRAWN AEROBIC AND ANAEROBIC 5CC  Final   Culture   Final    NO  GROWTH 2 DAYS Performed at [facility], [place] [address], [place], [place] [address]    Report Status PENDING  Incomplete  Blood Culture (routine x 2)     Status: None (Preliminary result)   Collection Time: 03/10/17  8:50 AM  Result Value Ref Range Status   Specimen Description BLOOD RIGHT ANTECUBITAL  Final   Special Requests BOTTLES DRAWN AEROBIC AND ANAEROBIC 5CC  Final   Culture   Final    NO GROWTH 2 DAYS Performed at [facility], [place] [address] [place], [place] [address]    Report Status PENDING  Incomplete  Urine culture     Status: Abnormal   Collection Time: 03/10/17  9:50 AM  Result Value Ref Range Status   Specimen Description URINE, RANDOM  Final   Special Requests NONE  Final   Culture 20,000 COLONIES/mL ESCHERICHIA COLI (A)  Final   Report Status 03/12/2017 FINAL  Final   Organism ID, Bacteria ESCHERICHIA COLI (A)  Final      Susceptibility   Escherichia coli - MIC*    AMPICILLIN 4 SENSITIVE Sensitive     CEFAZOLIN <=4 SENSITIVE Sensitive     CEFTRIAXONE <=1 SENSITIVE Sensitive     CIPROFLOXACIN <=0.25 SENSITIVE Sensitive     GENTAMICIN <=1 SENSITIVE Sensitive     IMIPENEM <=0.25 SENSITIVE Sensitive     NITROFURANTOIN <=16 SENSITIVE Sensitive     TRIMETH/SULFA <=20 SENSITIVE Sensitive     AMPICILLIN/SULBACTAM <=2 SENSITIVE Sensitive     PIP/TAZO <=4 SENSITIVE Sensitive     Extended ESBL NEGATIVE Sensitive     * 20,000 COLONIES/mL ESCHERICHIA COLI     Time coordinating discharge: 45 minutes  SIGNED:   Tawni Millers, MD  [facility] 03/13/2017, 11:53 AM Pager   If 7PM-7AM, please contact night-coverage [email or web address] Password [email or web address]

## 2017-03-13 NOTE — Care Management Note (Signed)
Case Management Note  Patient Details  Name: Sevon Rotert MRN: [id number] Date of Birth: 03/20/1929  Subjective/Objective:  PT recc no PT f/u. D/c plan back to senior apts @ [facility] w/spouse. No CM needs.                  Action/Plan:d/c home.   Expected Discharge Date:  03/13/17               Expected Discharge Plan:  Home/Self Care  In-House Referral:  Clinical Social Work  Discharge planning Services  CM Consult  Post Acute Care Choice:    Choice offered to:     DME Arranged:    DME Agency:     HH Arranged:    HH Agency:     Status of Service:  Completed, signed off  If discussed at [employer], dates discussed:    Additional Comments:  Dessa Phi, RN 03/13/2017, 1:07 PM

## 2017-03-13 NOTE — Progress Notes (Signed)
D/c to Home apartment w/ Dtr voices no c/o.all d/c instructions given w/ verbal understanding.Sport sling and ice pack sent w/ pt.

## 2017-03-15 LAB — CULTURE, BLOOD (ROUTINE X 2)
CULTURE: NO GROWTH
Culture: NO GROWTH

## 2017-03-17 ENCOUNTER — Telehealth: Payer: Self-pay

## 2017-03-17 NOTE — Telephone Encounter (Signed)
This is a patient of [place], who was admitted to [facility] after hospitalization. [facility] F/U is needed. Hospital discharge from [facility] on 03/13/3017

## 2017-03-18 ENCOUNTER — Encounter: Payer: Self-pay | Admitting: Internal Medicine

## 2017-03-18 ENCOUNTER — Non-Acute Institutional Stay: Payer: Medicare Other | Admitting: Internal Medicine

## 2017-03-18 VITALS — BP 114/68 | HR 65 | Temp 97.8°F | Ht 67.0 in | Wt 176.8 lb

## 2017-03-18 DIAGNOSIS — N50819 Testicular pain, unspecified: Secondary | ICD-10-CM

## 2017-03-18 DIAGNOSIS — Z7189 Other specified counseling: Secondary | ICD-10-CM | POA: Diagnosis not present

## 2017-03-18 DIAGNOSIS — A419 Sepsis, unspecified organism: Secondary | ICD-10-CM

## 2017-03-18 DIAGNOSIS — N453 Epididymo-orchitis: Secondary | ICD-10-CM | POA: Diagnosis not present

## 2017-03-18 DIAGNOSIS — Z7901 Long term (current) use of anticoagulants: Secondary | ICD-10-CM | POA: Diagnosis not present

## 2017-03-18 DIAGNOSIS — I10 Essential (primary) hypertension: Secondary | ICD-10-CM

## 2017-03-18 NOTE — Progress Notes (Signed)
Facility  [facility]    Place of Service: Clinic (12)     Allergies  Allergen Reactions  . Ciprofloxacin Other (See Comments)    Patient doesn't like the way it makes him feel  . Zestril [Lisinopril]     unknown  . Sulfa Antibiotics Rash    Chief Complaint  Patient presents with  . TOC    03/10/17 to 03/13/17 hospital for sepsis due to epididymoorchitis. Here with daughter Mechele Claude    HPI:  Hospitalized 03/10/17 to 03/13/17 for sepsis with E. Coli from epididymoorchitis. Treated with Levaquin. Also had pulmonary edema due to volume overload with acute respiratory faiilure. He was noted to have a hx of PAF, but only had PAC and PVC ths admission. Anticoagulation with apixaban was continued.   BP and BPH remained under control on his usual medications.  Medications: Patient's Medications  New Prescriptions   No medications on file  Previous Medications   ACETAMINOPHEN (TYLENOL) 325 MG TABLET    Take 2 tablets (650 mg total) by mouth every 6 (six) hours as needed for mild pain (or Fever >/= 101).   AMLODIPINE (NORVASC) 2.5 MG TABLET    TAKE 1 TABLET DAILY TO CONTROL BLOOD PRESSURE   ELIQUIS 5 MG TABS TABLET    TAKE 1 TABLET TWICE A DAY FOR ANTICOAGULATION   ESOMEPRAZOLE (NEXIUM) 20 MG CAPSULE    Take 20 mg by mouth daily at 12 noon.   FINASTERIDE (PROSCAR) 5 MG TABLET    TAKE 1 TABLET ONCE DAILY FOR URINATING DRIBBLING/PROSTATE ENLARGEMENT   IBUPROFEN (MOTRIN IB) 200 MG TABLET    Take 1 tablet (200 mg total) by mouth every 6 (six) hours as needed.   LEVOFLOXACIN (LEVAQUIN) 500 MG TABLET    Take 1 tablet (500 mg total) by mouth daily.   NAPROXEN SODIUM (ANAPROX) 220 MG TABLET    Take 220 mg by mouth. Take one daily after playing tennis   SIMVASTATIN (ZOCOR) 10 MG TABLET    TAKE 1 TABLET DAILY TO LOWER CHOLESTEROL   TERAZOSIN (HYTRIN) 2 MG CAPSULE    TAKE 1 CAPSULE DAILY   TOPROL XL 50 MG 24 HR TABLET    TAKE 1 TABLET TWICE A DAY TO CONTROL BLOOD PRESSURE AND HEART RHYTHM   VALSARTAN  (DIOVAN) 320 MG TABLET    TAKE 1 TABLET DAILY TO CONTROL BLOOD PRESSURE  Modified Medications   No medications on file  Discontinued Medications   No medications on file    Review of Systems  Constitutional: Negative for activity change, appetite change and fatigue.  HENT: Positive for hearing loss. Negative for congestion and ear pain.        Tinnitus  Eyes: Positive for visual disturbance (corrective lenses).  Cardiovascular: Negative for chest pain, palpitations and leg swelling.       Hx PAF  Gastrointestinal: Positive for constipation. Negative for abdominal distention, abdominal pain, anal bleeding, blood in stool, diarrhea, nausea and rectal pain.       Hemorrhoids  Endocrine: Negative.   Genitourinary: Positive for frequency. Negative for decreased urine volume, flank pain, penile pain, testicular pain and urgency.       Testicular swelling is resolving, but the right testicle remains tender and twice the size of the left testicle.  Musculoskeletal: Positive for arthralgias and back pain. Negative for gait problem, myalgias and neck pain.       Left knee pains. Chronic back pains. Pain in both shoulders with abduction and rotational movements.  Skin:       Removal of basal cell cancer of the left leg 12/10/14.  Allergic/Immunologic: Negative.   Neurological: Negative for dizziness, tremors, weakness, light-headedness, numbness and headaches.       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 Paresthesias of the right arm.  Psychiatric/Behavioral: Positive for sleep disturbance (Difficulty falling asleep). Negative for suicidal ideas. The patient is not nervous/anxious and is not hyperactive.     Vitals:   03/18/17 0854  BP: 114/68  Pulse: 65  Temp: 97.8 F (36.6 C)  TempSrc: Oral  SpO2: 98%  Weight: 176 lb 12.8 oz (80.2 kg)  Height: _0  (1.702 m)   Body mass index is 27.69 kg/m. Wt Readings from Last 3 Encounters:  03/18/17 176 lb 12.8 oz (80.2 kg)  03/10/17 182 lb 12.2  oz (82.9 kg)  01/28/17 185 lb 6.4 oz (84.1 kg)      Physical Exam  Constitutional: He is oriented to person, place, and time. He appears well-developed and well-nourished. No distress.  HENT:  Head: Normocephalic.  Right Ear: External ear normal.  Left Ear: External ear normal.  Nose: Nose normal.  Mouth/Throat: Oropharynx is clear and moist. No oropharyngeal exudate.  Bilateral hearing loss  Eyes: Conjunctivae and EOM are normal. Pupils are equal, round, and reactive to light.  Neck: No JVD present. No tracheal deviation present. No thyromegaly present.  Cardiovascular: Normal rate, regular rhythm, normal heart sounds and intact distal pulses.  Exam reveals no gallop and no friction rub.   No murmur heard. Normal pulses in left arm.  Pulmonary/Chest: No respiratory distress. He has no rales.  Abdominal: He exhibits no distension and no mass. There is no tenderness.  Genitourinary: Rectum normal, prostate normal and penis normal. Rectal exam shows guaiac negative stool. No penile tenderness.  Genitourinary Comments: Prostate slightly enlarged. No nodules. Right testicle is tender and twice the size of the left testicle.  Musculoskeletal: Normal range of motion. He exhibits no edema or tenderness.  Chronic back discomfort. Some difficulty lying down and getting up again. Pain in both shoulder areas with rotation and abduction.  Neurological: He is alert and oriented to person, place, and time. He has normal reflexes. No cranial nerve deficit. Coordination normal.  Skin: No rash noted. No erythema. No pallor.  Scaling areas on the forehead and scalp as well as along the left neck.  Psychiatric: He has a normal mood and affect. His behavior is normal. Judgment and thought content normal.  Mildly obsessive    Labs reviewed: Lab Summary Latest Ref Rng & Units 03/13/2017 03/12/2017 03/11/2017 03/11/2017 03/10/2017  Hemoglobin 13.0 - 17.0 g/dL 11.3(L) 11.0(L) 11.6(L) 10.9(L) 13.8  Hematocrit  39.0 - 52.0 % 32.5(L) 30.9(L) 33.4(L) 31.1(L) 38.2(L)  White count 4.0 - 10.5 K/uL 11.4(H) 16.9(H) 16.6(H) 17.7(H) 18.6(H)  Platelet count 150 - 400 K/uL 153 144(L) 137(L) 133(L) 136(L)  Sodium 135 - 145 mmol/L 139 137 (None) 139 136  Potassium 3.5 - 5.1 mmol/L 3.4(L) 3.3(L) (None) 3.6 3.6  Calcium 8.9 - 10.3 mg/dL 8.6(L) 7.9(L) (None) 8.3(L) 9.2  Phosphorus - (None) (None) (None) (None) (None)  Creatinine 0.61 - 1.24 mg/dL 1.05 1.07 (None) 1.06 1.41(H)  AST 15 - 41 U/L (None) (None) (None) 21 29  Alk Phos 38 - 126 U/L (None) (None) (None) 55 71  Bilirubin 0.3 - 1.2 mg/dL (None) (None) (None) 1.0 1.3(H)  Glucose 65 - 99 mg/dL 90 99 (None) 109(H) 126(H)  Cholesterol - (None) (None) (None) (None) (None)  HDL  cholesterol - (None) (None) (None) (None) (None)  Triglycerides - (None) (None) (None) (None) (None)  LDL Direct - (None) (None) (None) (None) (None)  LDL Calc - (None) (None) (None) (None) (None)  Total protein 6.5 - 8.1 g/dL (None) (None) (None) 5.9(L) 7.7  Albumin 3.5 - 5.0 g/dL (None) (None) (None) 2.7(L) 3.7  Some recent data might be hidden   No results found for: TSH, T3TOTAL, T4TOTAL, THYROIDAB Lab Results  Component Value Date   BUN 16 03/13/2017   BUN 15 03/12/2017   BUN 20 03/11/2017   No results found for: HGBA1C  Assessment/Plan  1. Epididymoorchitis 6 more days of Levaquin  2. Sepsis, due to unspecified organism ([place]) Resolved. Still weak. -CBC, CMP  3. Testicular pain Right side. Improving.  4. Essential hypertension controlled  5. Long term current use of anticoagulant therapy continue for PAF  6. Advanced care planning discussion I recommended FULL CODE, Living Will, and HCPOA (his daughter Chrisandra Carota). He will enact documents and get [place] a copy.

## 2017-03-21 ENCOUNTER — Telehealth: Payer: Self-pay

## 2017-03-21 ENCOUNTER — Encounter (HOSPITAL_COMMUNITY): Payer: Self-pay | Admitting: *Deleted

## 2017-03-21 ENCOUNTER — Ambulatory Visit (HOSPITAL_COMMUNITY)
Admission: EM | Admit: 2017-03-21 | Discharge: 2017-03-21 | Disposition: A | Discharge status: Home / Self Care | Payer: Medicare Other | Attending: Internal Medicine | Admitting: Internal Medicine

## 2017-03-21 DIAGNOSIS — K219 Gastro-esophageal reflux disease without esophagitis: Secondary | ICD-10-CM | POA: Diagnosis not present

## 2017-03-21 DIAGNOSIS — Z882 Allergy status to sulfonamides status: Secondary | ICD-10-CM | POA: Insufficient documentation

## 2017-03-21 DIAGNOSIS — Z881 Allergy status to other antibiotic agents status: Secondary | ICD-10-CM | POA: Diagnosis not present

## 2017-03-21 DIAGNOSIS — Z7901 Long term (current) use of anticoagulants: Secondary | ICD-10-CM | POA: Diagnosis not present

## 2017-03-21 DIAGNOSIS — Z800 Family history of malignant neoplasm of digestive organs: Secondary | ICD-10-CM | POA: Insufficient documentation

## 2017-03-21 DIAGNOSIS — Z85828 Personal history of other malignant neoplasm of skin: Secondary | ICD-10-CM | POA: Insufficient documentation

## 2017-03-21 DIAGNOSIS — N401 Enlarged prostate with lower urinary tract symptoms: Secondary | ICD-10-CM

## 2017-03-21 DIAGNOSIS — Z8673 Personal history of transient ischemic attack (TIA), and cerebral infarction without residual deficits: Secondary | ICD-10-CM | POA: Diagnosis not present

## 2017-03-21 DIAGNOSIS — M1990 Unspecified osteoarthritis, unspecified site: Secondary | ICD-10-CM | POA: Diagnosis not present

## 2017-03-21 DIAGNOSIS — R339 Retention of urine, unspecified: Secondary | ICD-10-CM | POA: Diagnosis present

## 2017-03-21 DIAGNOSIS — I4891 Unspecified atrial fibrillation: Secondary | ICD-10-CM | POA: Diagnosis not present

## 2017-03-21 DIAGNOSIS — Z79899 Other long term (current) drug therapy: Secondary | ICD-10-CM | POA: Insufficient documentation

## 2017-03-21 DIAGNOSIS — Z888 Allergy status to other drugs, medicaments and biological substances status: Secondary | ICD-10-CM | POA: Insufficient documentation

## 2017-03-21 DIAGNOSIS — R3914 Feeling of incomplete bladder emptying: Secondary | ICD-10-CM

## 2017-03-21 DIAGNOSIS — Z8249 Family history of ischemic heart disease and other diseases of the circulatory system: Secondary | ICD-10-CM | POA: Insufficient documentation

## 2017-03-21 DIAGNOSIS — I10 Essential (primary) hypertension: Secondary | ICD-10-CM | POA: Diagnosis not present

## 2017-03-21 DIAGNOSIS — E785 Hyperlipidemia, unspecified: Secondary | ICD-10-CM | POA: Insufficient documentation

## 2017-03-21 LAB — POCT URINALYSIS DIP (DEVICE)
Bilirubin Urine: NEGATIVE
GLUCOSE, UA: NEGATIVE mg/dL
HGB URINE DIPSTICK: NEGATIVE
Ketones, ur: NEGATIVE mg/dL
LEUKOCYTES UA: NEGATIVE
NITRITE: NEGATIVE
PROTEIN: NEGATIVE mg/dL
Specific Gravity, Urine: 1.025 (ref 1.005–1.030)
UROBILINOGEN UA: 0.2 mg/dL (ref 0.0–1.0)
pH: 6 (ref 5.0–8.0)

## 2017-03-21 NOTE — Telephone Encounter (Signed)
Patients daughter left message stated that her father is have a problem urinating and that he was recently in the hospital for sepsis.   I called patients daughter and urged her to take him to urgent care due to [place] not having a provider in the office. She agreed to take him.

## 2017-03-21 NOTE — Discharge Instructions (Signed)
Continue your medication. Follow-up with your primary care doctor Monday as scheduled. If you develop fevers, or have a total inability to urinate and empty your bladder or other concerns may return or go to the emergency department. Her urinalysis is completely normal. As long as you are able to empty your bladder now I think that a Foley catheter is not a good idea annual do well enough without it.

## 2017-03-21 NOTE — ED Triage Notes (Addendum)
Patient states that he was hospitalized last week for UTI and was discharged home. States has been taking temperature at home and noted today that temperature to be 94.3. Family is using oral thermometer. Patient states that still is having fatigue and last night reports urinary retention. Patient currently still antibiotics, 2 doses left.  Patient reports history of UTI and prostatitis.

## 2017-03-21 NOTE — ED Provider Notes (Signed)
CSN: [id number]     Arrival date & time 03/21/17  1629 History   First MD Initiated Contact with Patient 03/21/17 1740     Chief Complaint  Patient presents with  . Urinary Retention   (Consider location/radiation/quality/duration/timing/severity/associated sxs/prior Treatment) 83 year old male that had recently been admitted to the hospital with urosepsis, orchitis, UTI and possibly prostatitis. He received IVF and antibiotics and was discharged on Levaquin. His family was advised that if his temperature dropped or other problems to seek medical attention promptly. Family's been taking his temperature daily and today obtained a number of 94.5. The patient states that he had no symptoms of illness, worsening or new symptoms. He does complain of trouble with urination in that he feels sometimes that does not empty his bladder where he has a weak or interrupted strain. Denies dysuria. He has a history of prostatitis and is treated with finasteride and Terazosin. This is the patient's only complaint. He states is not completely new but a little worse. This afternoon he states the symptoms are improved. His daughter is accompanying him and is seeking reassurance that he is not having an exacerbation or worsening in any way.      Past Medical History:  Diagnosis Date  . Atrial fibrillation ([place]) 03/26/2011  . Basal cell carcinoma of other specified sites of skin 4/10/012  . Basal cell carcinoma of skin, site unspecified 03/25/2012  . Benign neoplasm of colon 03/25/2012   adenomatous polyps  . Colon polyp 2014  . Degeneration of lumbar or lumbosacral intervertebral disc 09/24/2011  . Disturbance of skin sensation 01/28/2012  . Diverticulosis 2014  . Dysphagia, unspecified(787.20) 03/26/2011  . Elevated prostate specific antigen (PSA) 03/26/2011  . Exposure to unspecified radiation 03/26/2011  . Family history of colon cancer 07/16/2013  . GERD (gastroesophageal reflux disease) 07/16/2013  . Hearing  deficit 12/21/2013  . Hemangioma of unspecified site 03/26/2011  . Hx of adenomatous colonic polyps 07/16/2013  . Hypertrophy of prostate with urinary obstruction and other lower urinary tract symptoms (LUTS) 03/26/2011  . Ingrowing nail 03/26/2011  . Internal hemorrhoids without mention of complication 07/17/49  . Keratosis, actinic 01/28/2012  . Long term (current) use of anticoagulants 03/26/2011  . Long term current use of anticoagulant therapy 07/23/2016  . Lumbago 03/26/2011  . 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 Osteoarthrosis, unspecified whether generalized or localized, unspecified site 11/24/2012  . Other and unspecified hyperlipidemia 03/26/2011  . Other malaise and fatigue 03/26/2011  . Pain in joint, forearm 11/24/2012  . Pain in joint, lower leg 06/15/13   left knee  . Paresthesia of left arm 07/05/2014  . Rectal bleed 06/15/13  . Reflux esophagitis 03/26/2011  . Sacroiliitis, not elsewhere classified ([place]) 05/28/2011  . Scoliosis (and kyphoscoliosis), idiopathic 05/28/2011  . 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 Tinnitus 12/21/2013  . 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 Unspecified essential hypertension 03/26/2011   Past Surgical History:  Procedure Laterality Date  . BASAL CELL CARCINOMA EXCISION Left 12/10/14   Dr. Jarome Matin  . COLONOSCOPY  08/12/2013   Dr. Hilarie Fredrickson adenomatous polyps  . CRYOTHERAPY Left 2009   hand  . PHOTODYNAMIC THERAPY  09/26/14   face and ear lobs  Dr. Jarome Matin  . SQUAMOUS CELL CARCINOMA EXCISION Left 2010   hand   Family History  Problem Relation Age of Onset  . Cancer Mother  colon  . Heart disease Father     MI   Social History  Substance Use Topics  . Smoking status: Never Smoker  . Smokeless tobacco: Never Used  . Alcohol use No    Review of Systems  Constitutional: Positive for activity change. Negative  for fever.  HENT: Negative.   Respiratory: Negative.   Cardiovascular: Negative for chest pain and leg swelling.  Gastrointestinal: Negative.   Genitourinary: Negative for discharge, dysuria, flank pain, genital sores, scrotal swelling and testicular pain.       As per history of present illness  Neurological: Negative.   All other systems reviewed and are negative.   Allergies  Ciprofloxacin; Zestril [lisinopril]; and Sulfa antibiotics  Home Medications   Prior to Admission medications   Medication Sig Start Date End Date Taking? Authorizing Provider  acetaminophen (TYLENOL) 325 MG tablet Take 2 tablets (650 mg total) by mouth every 6 (six) hours as needed for mild pain (or Fever >/= 101). 03/13/17   Mauricio Gerome Apley, MD  amLODipine (NORVASC) 2.5 MG tablet TAKE 1 TABLET DAILY TO CONTROL BLOOD PRESSURE 12/10/16   Estill Dooms, MD  ELIQUIS 5 MG TABS tablet TAKE 1 TABLET TWICE A DAY FOR ANTICOAGULATION 02/10/17   Estill Dooms, MD  esomeprazole (NEXIUM) 20 MG capsule Take 20 mg by mouth daily at 12 noon.    Historical Provider, MD  finasteride (PROSCAR) 5 MG tablet TAKE 1 TABLET ONCE DAILY FOR URINATING DRIBBLING/PROSTATE ENLARGEMENT 06/17/16   Estill Dooms, MD  ibuprofen (MOTRIN IB) 200 MG tablet Take 1 tablet (200 mg total) by mouth every 6 (six) hours as needed. 03/13/17   Mauricio Gerome Apley, MD  levofloxacin (LEVAQUIN) 500 MG tablet Take 1 tablet (500 mg total) by mouth daily. 03/13/17 03/23/17  Mauricio Gerome Apley, MD  naproxen sodium (ANAPROX) 220 MG tablet Take 220 mg by mouth. Take one daily after playing tennis    Historical Provider, MD  simvastatin (ZOCOR) 10 MG tablet TAKE 1 TABLET DAILY TO LOWER CHOLESTEROL 09/25/15   Estill Dooms, MD  terazosin (HYTRIN) 2 MG capsule TAKE 1 CAPSULE DAILY 06/21/16   Estill Dooms, MD  TOPROL XL 50 MG 24 hr tablet TAKE 1 TABLET TWICE A DAY TO CONTROL BLOOD PRESSURE AND HEART RHYTHM 08/23/16   Estill Dooms, MD  valsartan (DIOVAN) 320  MG tablet TAKE 1 TABLET DAILY TO CONTROL BLOOD PRESSURE 06/17/16   Estill Dooms, MD   Meds Ordered and Administered this Visit  Medications - No data to display  BP (!) 142/91 (BP Location: Left Arm) Comment: notified rn  Pulse 65   Temp 98.2 F (36.8 C)   Resp 14   SpO2 98%  No data found.   Physical Exam  Constitutional: He is oriented to person, place, and time. He appears well-developed and well-nourished.  Eyes: EOM are normal.  Neck: Normal range of motion.  Cardiovascular: Normal rate, regular rhythm, normal heart sounds and intact distal pulses.   Pulmonary/Chest: Effort normal and breath sounds normal. No respiratory distress. He has no wheezes. He has no rales.  Abdominal: Soft. There is no tenderness.  Neurological: He is alert and oriented to person, place, and time. Coordination normal.  Ambulatory to and from the bathroom with balanced gait.  Skin: Skin is warm and dry. No rash noted.  Psychiatric: He has a normal mood and affect.  Nursing note and vitals reviewed.   Urgent Care Course     Procedures (including  critical care time)  Labs Review Labs Reviewed  URINE CULTURE  POCT URINALYSIS DIP (DEVICE)    Imaging Review No results found.   Visual Acuity Review  Right Eye Distance:   Left Eye Distance:   Bilateral Distance:    Right Eye Near:   Left Eye Near:    Bilateral Near:         MDM   1. Benign prostatic hyperplasia with incomplete bladder emptying    Continue your medication. Follow-up with your primary care doctor Monday as scheduled. If you develop fevers, or have a total inability to urinate and empty your bladder or other concerns may return or go to the emergency department. Her urinalysis is completely normal. As long as you are able to empty your bladder now I think that a Foley catheter is not a good idea annual do well enough without it. Approximate 30 minutes was spent with the patient. He was stable however he and daughter  discussing several concerns and reviewing the chart prolonged the visit.    Janne Napoleon, NP 03/21/17 2040

## 2017-03-23 LAB — URINE CULTURE: Culture: NO GROWTH

## 2017-03-24 ENCOUNTER — Other Ambulatory Visit: Payer: Self-pay

## 2017-03-24 DIAGNOSIS — I10 Essential (primary) hypertension: Secondary | ICD-10-CM | POA: Diagnosis not present

## 2017-03-24 DIAGNOSIS — R972 Elevated prostate specific antigen [PSA]: Secondary | ICD-10-CM | POA: Diagnosis not present

## 2017-03-24 DIAGNOSIS — E785 Hyperlipidemia, unspecified: Secondary | ICD-10-CM | POA: Diagnosis not present

## 2017-03-24 LAB — LIPID PANEL
Cholesterol: 110 mg/dL (ref <200)
HDL: 31 mg/dL — ABNORMAL LOW (ref >40)
LDL CALC: 56 mg/dL (ref <100)
Total CHOL/HDL Ratio: 3.5 Ratio (ref <5.0)
Triglycerides: 114 mg/dL (ref <150)
VLDL: 23 mg/dL (ref <30)

## 2017-03-24 LAB — COMPREHENSIVE METABOLIC PANEL
ALBUMIN: 3.4 g/dL — AB (ref 3.6–5.1)
ALT: 12 U/L (ref 9–46)
AST: 16 U/L (ref 10–35)
Alkaline Phosphatase: 79 U/L (ref 40–115)
BILIRUBIN TOTAL: 0.4 mg/dL (ref 0.2–1.2)
BUN: 12 mg/dL (ref 7–25)
CALCIUM: 8.9 mg/dL (ref 8.6–10.3)
CO2: 25 mmol/L (ref 20–31)
CREATININE: 1.08 mg/dL (ref 0.70–1.11)
Chloride: 105 mmol/L (ref 98–110)
Glucose, Bld: 90 mg/dL (ref 65–99)
Potassium: 4.3 mmol/L (ref 3.5–5.3)
SODIUM: 140 mmol/L (ref 135–146)
TOTAL PROTEIN: 6.4 g/dL (ref 6.1–8.1)

## 2017-03-25 LAB — PSA: PSA: 3.3 ng/mL (ref <=4.0)

## 2017-04-21 ENCOUNTER — Other Ambulatory Visit: Payer: Self-pay | Admitting: Internal Medicine

## 2017-04-22 ENCOUNTER — Other Ambulatory Visit: Payer: Self-pay

## 2017-04-22 MED ORDER — SIMVASTATIN 10 MG PO TABS: ORAL_TABLET | ORAL | 3 refills | Status: DC

## 2017-04-22 NOTE — Telephone Encounter (Signed)
Medication requested by [facility].

## 2017-05-16 DIAGNOSIS — E785 Hyperlipidemia, unspecified: Secondary | ICD-10-CM | POA: Diagnosis not present

## 2017-05-16 DIAGNOSIS — I10 Essential (primary) hypertension: Secondary | ICD-10-CM | POA: Diagnosis not present

## 2017-05-16 DIAGNOSIS — R972 Elevated prostate specific antigen [PSA]: Secondary | ICD-10-CM | POA: Diagnosis not present

## 2017-05-16 NOTE — Addendum Note (Signed)
Addended by: Ripley Fraise on: 05/16/2017 09:43 AM   Modules accepted: Orders

## 2017-05-19 LAB — COMPLETE METABOLIC PANEL WITH GFR
ALBUMIN: 3.7 g/dL (ref 3.6–5.1)
ALT: 9 U/L (ref 9–46)
AST: 15 U/L (ref 10–35)
Alkaline Phosphatase: 70 U/L (ref 40–115)
BILIRUBIN TOTAL: 0.7 mg/dL (ref 0.2–1.2)
BUN: 13 mg/dL (ref 7–25)
CO2: 24 mmol/L (ref 20–31)
CREATININE: 1.21 mg/dL — AB (ref 0.70–1.11)
Calcium: 9 mg/dL (ref 8.6–10.3)
Chloride: 111 mmol/L — ABNORMAL HIGH (ref 98–110)
GFR, EST AFRICAN AMERICAN: 62 mL/min (ref >=60)
GFR, EST NON AFRICAN AMERICAN: 54 mL/min — AB (ref >=60)
GLUCOSE: 95 mg/dL (ref 65–99)
Potassium: 3.8 mmol/L (ref 3.5–5.3)
Sodium: 145 mmol/L (ref 135–146)
TOTAL PROTEIN: 6.3 g/dL (ref 6.1–8.1)

## 2017-05-19 LAB — LIPID PANEL
Cholesterol: 132 mg/dL (ref <200)
HDL: 45 mg/dL (ref >40)
LDL Cholesterol: 61 mg/dL (ref <100)
TRIGLYCERIDES: 128 mg/dL (ref <150)
Total CHOL/HDL Ratio: 2.9 Ratio (ref <5.0)
VLDL: 26 mg/dL (ref <30)

## 2017-05-19 LAB — PSA: PSA: 1.5 ng/mL (ref <=4.0)

## 2017-05-20 ENCOUNTER — Other Ambulatory Visit: Payer: Medicare Other

## 2017-05-27 ENCOUNTER — Non-Acute Institutional Stay: Payer: Medicare Other | Admitting: Internal Medicine

## 2017-05-27 ENCOUNTER — Encounter: Payer: Self-pay | Admitting: Internal Medicine

## 2017-05-27 VITALS — BP 138/82 | HR 57 | Temp 97.0°F | Ht 67.0 in | Wt 187.0 lb

## 2017-05-27 DIAGNOSIS — Z7901 Long term (current) use of anticoagulants: Secondary | ICD-10-CM

## 2017-05-27 DIAGNOSIS — R972 Elevated prostate specific antigen [PSA]: Secondary | ICD-10-CM | POA: Diagnosis not present

## 2017-05-27 DIAGNOSIS — I10 Essential (primary) hypertension: Secondary | ICD-10-CM | POA: Diagnosis not present

## 2017-05-27 DIAGNOSIS — E785 Hyperlipidemia, unspecified: Secondary | ICD-10-CM

## 2017-05-27 DIAGNOSIS — I480 Paroxysmal atrial fibrillation: Secondary | ICD-10-CM

## 2017-05-27 DIAGNOSIS — M25511 Pain in right shoulder: Secondary | ICD-10-CM

## 2017-05-27 DIAGNOSIS — A419 Sepsis, unspecified organism: Secondary | ICD-10-CM | POA: Diagnosis not present

## 2017-05-27 DIAGNOSIS — G8929 Other chronic pain: Secondary | ICD-10-CM

## 2017-05-27 NOTE — Progress Notes (Signed)
Facility  [facility]    Place of Service: Clinic (12)     Allergies  Allergen Reactions  . Ciprofloxacin Other (See Comments)    Patient doesn't like the way it makes him feel  . Zestril [Lisinopril]     unknown  . Sulfa Antibiotics Rash    Chief Complaint  Patient presents with  . Medical Management of Chronic Issues    4 month medication management blood pressure, cholesterol, A-Fib, elevated PSA, review labs    HPI:  Essential hypertension 0 controlled  Paroxysmal atrial fibrillation (HCC) - currently in NSR. Denies palpitations  Elevated prostate specific antigen (PSA) - resolved  Hyperlipidemia, unspecified hyperlipidemia type - controlled  Long term current use of anticoagulant therapy - for PAF  Chronic right shoulder pain - has chronic pain in the right shoouldeer with movement like elevation and rotation. Has been reluctant to resume tennis because of the pain. He wants to know how he can improve it.  Sepsis, due to unspecified organism [facility]) -= resolved urosepsis from 03/10/17. No current urinary symptoms.    Medications: Patient's Medications  New Prescriptions   No medications on file  Previous Medications   ACETAMINOPHEN (TYLENOL) 325 MG TABLET    Take 2 tablets (650 mg total) by mouth every 6 (six) hours as needed for mild pain (or Fever >/= 101).   AMLODIPINE (NORVASC) 2.5 MG TABLET    TAKE 1 TABLET DAILY TO CONTROL BLOOD PRESSURE   ELIQUIS 5 MG TABS TABLET    TAKE 1 TABLET TWICE A DAY FOR ANTICOAGULATION   FINASTERIDE (PROSCAR) 5 MG TABLET    TAKE 1 TABLET ONCE DAILY FOR URINATING DRIBBLING/PROSTATE ENLARGEMENT   IBUPROFEN (MOTRIN IB) 200 MG TABLET    Take 1 tablet (200 mg total) by mouth every 6 (six) hours as needed.   NAPROXEN SODIUM (ANAPROX) 220 MG TABLET    Take 220 mg by mouth. Take one daily after playing tennis   RANITIDINE (ZANTAC) 150 MG TABLET    Take one tablet twice daily   SIMVASTATIN (ZOCOR) 10 MG TABLET    TAKE 1 TABLET DAILY TO LOWER  CHOLESTEROL   TERAZOSIN (HYTRIN) 2 MG CAPSULE    TAKE 1 CAPSULE DAILY   TOPROL XL 50 MG 24 HR TABLET    TAKE 1 TABLET TWICE A DAY TO CONTROL BLOOD PRESSURE AND HEART RHYTHM   VALSARTAN (DIOVAN) 320 MG TABLET    TAKE 1 TABLET DAILY TO CONTROL BLOOD PRESSURE  Modified Medications   No medications on file  Discontinued Medications   ESOMEPRAZOLE (NEXIUM) 20 MG CAPSULE    Take 20 mg by mouth daily at 12 noon.     Review of Systems  Constitutional: Negative for activity change, appetite change and fatigue.  HENT: Positive for hearing loss. Negative for congestion and ear pain.        Tinnitus  Eyes: Positive for visual disturbance (corrective lenses).  Cardiovascular: Negative for chest pain, palpitations and leg swelling.       Hx PAF  Gastrointestinal: Positive for constipation. Negative for abdominal distention, abdominal pain, anal bleeding, blood in stool, diarrhea, nausea and rectal pain.       Hemorrhoids  Endocrine: Negative.   Genitourinary: Positive for frequency. Negative for decreased urine volume, flank pain, penile pain, testicular pain and urgency.       Testicular swelling is resolved.  Musculoskeletal: Positive for arthralgias and back pain. Negative for gait problem, myalgias and neck pain.  Left knee pains. Chronic back pains. Pain in both shoulders with abduction and rotational movements. R>L>  Skin:       Removal of basal cell cancer of the left leg 12/10/14.  Allergic/Immunologic: Negative.   Neurological: Negative for dizziness, tremors, weakness (stable), light-headedness, numbness and headaches.       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 Paresthesias of the right arm.  Psychiatric/Behavioral: Positive for sleep disturbance (Difficulty falling asleep). Negative for suicidal ideas. The patient is not nervous/anxious and is not hyperactive.     Vitals:   05/27/17 0852  BP: 138/82  Pulse: (!) 57  Temp: 97 F (36.1 C)  TempSrc: Oral  SpO2: 97%  Weight: 187  lb (84.8 kg)  Height: 5' 7"  (1.702 m)   Wt Readings from Last 3 Encounters:  05/27/17 187 lb (84.8 kg)  03/18/17 176 lb 12.8 oz (80.2 kg)  03/10/17 182 lb 12.2 oz (82.9 kg)    Body mass index is 29.29 kg/m.  Physical Exam  Constitutional: He is oriented to person, place, and time. He appears well-developed and well-nourished. No distress.  HENT:  Head: Normocephalic.  Right Ear: External ear normal.  Left Ear: External ear normal.  Nose: Nose normal.  Mouth/Throat: Oropharynx is clear and moist. No oropharyngeal exudate.  Bilateral hearing loss  Eyes: Conjunctivae and EOM are normal. Pupils are equal, round, and reactive to light.  Neck: No JVD present. No tracheal deviation present. No thyromegaly present.  Cardiovascular: Normal rate, regular rhythm, normal heart sounds and intact distal pulses.  Exam reveals no gallop and no friction rub.   No murmur heard. Normal pulses in left arm.  Pulmonary/Chest: No respiratory distress. He has no rales.  Abdominal: He exhibits no distension and no mass. There is no tenderness.  Genitourinary: Rectum normal, prostate normal and penis normal. Rectal exam shows guaiac negative stool. No penile tenderness.  Genitourinary Comments: Prostate slightly enlarged. No nodules.  Musculoskeletal: Normal range of motion. He exhibits no edema or tenderness.  Chronic back discomfort. Some difficulty lying down and getting up again. Pain in both shoulder areas with rotation and abduction.  Neurological: He is alert and oriented to person, place, and time. He has normal reflexes. No cranial nerve deficit. Coordination normal.  Skin: No rash noted. No erythema. No pallor.  Scaling areas on the forehead and scalp as well as along the left neck.  Psychiatric: He has a normal mood and affect. His behavior is normal. Judgment and thought content normal.  Mildly obsessive     Labs reviewed: Lab Summary Latest Ref Rng & Units 05/16/2017 03/24/2017 03/13/2017    Hemoglobin 13.0 - 17.0 g/dL (None) (None) 11.3(L)  Hematocrit 39.0 - 52.0 % (None) (None) 32.5(L)  White count 4.0 - 10.5 K/uL (None) (None) 11.4(H)  Platelet count 150 - 400 K/uL (None) (None) 153  Sodium 135 - 146 mmol/L 145 140 139  Potassium 3.5 - 5.3 mmol/L 3.8 4.3 3.4(L)  Calcium 8.6 - 10.3 mg/dL 9.0 8.9 8.6(L)  Phosphorus - (None) (None) (None)  Creatinine 0.70 - 1.11 mg/dL 1.21(H) 1.08 1.05  AST 10 - 35 U/L 15 16 (None)  Alk Phos 40 - 115 U/L 70 79 (None)  Bilirubin 0.2 - 1.2 mg/dL 0.7 0.4 (None)  Glucose 65 - 99 mg/dL 95 90 90  Cholesterol <200 mg/dL 132 110 (None)  HDL cholesterol >40 mg/dL 45 31(L) (None)  Triglycerides <150 mg/dL 128 114 (None)  LDL Direct - (None) (None) (None)  LDL Calc <100 mg/dL  61 56 (None)  Total protein 6.1 - 8.1 g/dL 6.3 6.4 (None)  Albumin 3.6 - 5.1 g/dL 3.7 3.4(L) (None)  Some recent data might be hidden   No results found for: TSH Lab Results  Component Value Date   BUN 13 05/16/2017   BUN 12 03/24/2017   BUN 16 03/13/2017   Lab Results  Component Value Date   CREATININE 1.21 (H) 05/16/2017   CREATININE 1.08 03/24/2017   CREATININE 1.05 03/13/2017   No results found for: HGBA1C     Assessment/Plan  1. Essential hypertension The current medical regimen is effective;  continue present plan and medications. - Comprehensive metabolic panel; Future  2. Paroxysmal atrial fibrillation (HCC) stable  3. Elevated prostate specific antigen (PSA) resolved  4. Hyperlipidemia, unspecified hyperlipidemia type - Lipid panel; Future  5. Long term current use of anticoagulant therapy Continue Eliquis for PAF  6. Chronic right shoulder pain - Ambulatory referral to Sports Medicine  7. Sepsis, due to unspecified organism [facility] resolved

## 2017-05-29 ENCOUNTER — Other Ambulatory Visit: Payer: Self-pay | Admitting: Internal Medicine

## 2017-06-07 ENCOUNTER — Other Ambulatory Visit: Payer: Self-pay | Admitting: Internal Medicine

## 2017-06-13 DIAGNOSIS — L723 Sebaceous cyst: Secondary | ICD-10-CM | POA: Diagnosis not present

## 2017-06-13 DIAGNOSIS — D1801 Hemangioma of skin and subcutaneous tissue: Secondary | ICD-10-CM | POA: Diagnosis not present

## 2017-06-13 DIAGNOSIS — L918 Other hypertrophic disorders of the skin: Secondary | ICD-10-CM | POA: Diagnosis not present

## 2017-06-13 DIAGNOSIS — L821 Other seborrheic keratosis: Secondary | ICD-10-CM | POA: Diagnosis not present

## 2017-06-13 DIAGNOSIS — L570 Actinic keratosis: Secondary | ICD-10-CM | POA: Diagnosis not present

## 2017-06-13 DIAGNOSIS — Z85828 Personal history of other malignant neoplasm of skin: Secondary | ICD-10-CM | POA: Diagnosis not present

## 2017-06-13 NOTE — Addendum Note (Signed)
Addended by: Royann Shivers A on: 06/13/2017 03:56 PM   Modules accepted: Orders

## 2017-06-16 ENCOUNTER — Ambulatory Visit: Payer: Medicare Other | Admitting: Sports Medicine

## 2017-06-26 ENCOUNTER — Ambulatory Visit (INDEPENDENT_AMBULATORY_CARE_PROVIDER_SITE_OTHER): Payer: Medicare Other | Admitting: Sports Medicine

## 2017-06-26 ENCOUNTER — Encounter: Payer: Self-pay | Admitting: Sports Medicine

## 2017-06-26 ENCOUNTER — Ambulatory Visit
Admission: RE | Admit: 2017-06-26 | Discharge: 2017-06-26 | Disposition: A | Discharge status: Home / Self Care | Payer: Medicare Other | Source: Ambulatory Visit | Attending: Sports Medicine | Admitting: Sports Medicine

## 2017-06-26 VITALS — BP 130/80 | Ht 67.0 in | Wt 180.0 lb

## 2017-06-26 DIAGNOSIS — M25511 Pain in right shoulder: Secondary | ICD-10-CM

## 2017-06-26 DIAGNOSIS — M19011 Primary osteoarthritis, right shoulder: Secondary | ICD-10-CM

## 2017-06-26 MED ORDER — METHYLPREDNISOLONE ACETATE 40 MG/ML IJ SUSP
40.0000 mg | Freq: Once | INTRAMUSCULAR | Status: AC
  Administered 2017-06-26: 40 mg via INTRA_ARTICULAR

## 2017-06-27 NOTE — Progress Notes (Signed)
   Subjective:    Patient ID: Phillip [name], male    DOB: 1929/10/31, 83 y.o.   MRN: [id number]  HPI chief complaint: Right shoulder pain  Very pleasant 83 year old right-hand-dominant male comes in today complaining of 4 months of right shoulder pain. He enjoys playing tennis and he notices most of his pain with overhead activity such as serving or with hitting overhand. He has no pain with ground strokes. He also has pain with overhead reaching such as getting things out of a high cabinet. He was hospitalized back in March with sepsis secondary to a UTI and his pain started shortly thereafter. He denies any fevers or chills. He endorses a "crunching" sensation in his shoulder with activity. He denies any prior shoulder surgeries. No neck pain. No numbness or tingling. His symptoms are not bad enough that he has to take any sort of pain medication.  Past medical history reviewed Medications reviewed Allergies reviewed    Review of Systems    as above Objective:   Physical Exam  Well-developed, well-nourished. No acute distress. Awake alert and oriented 3. Vital signs reviewed.  Right shoulder: Patient has limited forward flexion to about 120. Abduction is also limited to about 120. Internal rotation limited to 70. External rotation limited to 80. He has no tenderness to palpation over the acromioclavicular joint. No tenderness to palpation over the bicipital groove. His rotator cuff strength is 5/5 and is not reproducible of symptoms. He does have pain with Hawkins testing. Negative empty can. Skin is intact and nonerythematous. Joint is not warm to touch. There is no swelling. He is neurovascularly intact distally.  X-rays of the right shoulder including AP, lateral, and axillary views show extensive degenerative changes through the glenohumeral joint. Nothing acute.      Assessment & Plan:   Right shoulder pain likely secondary to advanced glenohumeral DJD  I elected to inject  the patient's subacromial space today with cortisone. He will return to the office in 3 weeks and if today's injection was ineffective then we will plan on doing a glenohumeral injection under ultrasound guidance (I will also discuss his x-rays with him as those results were not available until after he left the office). In the meantime, I think he can try to return to activity as tolerated. Definitive treatment would be a total shoulder arthroplasty but I'm not sure that the patient's symptoms are bad enough that he would like to entertain such a risky surgery. Patient is encouraged to call with questions or concerns prior to his follow-up visit.  Consent obtained and verified. Time-out conducted. Noted no overlying erythema, induration, or other signs of local infection. Skin prepped in a sterile fashion. Topical analgesic spray: Ethyl chloride. Joint: right shoulder, subacromial joint Needle: 25g 1.5 inch Completed without difficulty. Meds: 3cc 1% xylocaine, 1cc (40mg ) depomedrol  Advised to call if fevers/chills, erythema, induration, drainage, or persistent bleeding.

## 2017-07-02 ENCOUNTER — Other Ambulatory Visit: Payer: Self-pay | Admitting: Internal Medicine

## 2017-07-08 DIAGNOSIS — H25043 Posterior subcapsular polar age-related cataract, bilateral: Secondary | ICD-10-CM | POA: Diagnosis not present

## 2017-07-08 DIAGNOSIS — H25013 Cortical age-related cataract, bilateral: Secondary | ICD-10-CM | POA: Diagnosis not present

## 2017-07-08 DIAGNOSIS — H02839 Dermatochalasis of unspecified eye, unspecified eyelid: Secondary | ICD-10-CM | POA: Diagnosis not present

## 2017-07-08 DIAGNOSIS — H2511 Age-related nuclear cataract, right eye: Secondary | ICD-10-CM | POA: Diagnosis not present

## 2017-07-08 DIAGNOSIS — H2513 Age-related nuclear cataract, bilateral: Secondary | ICD-10-CM | POA: Diagnosis not present

## 2017-07-21 ENCOUNTER — Ambulatory Visit (INDEPENDENT_AMBULATORY_CARE_PROVIDER_SITE_OTHER): Payer: Medicare Other | Admitting: Sports Medicine

## 2017-07-21 ENCOUNTER — Encounter: Payer: Self-pay | Admitting: Sports Medicine

## 2017-07-21 VITALS — BP 138/88 | Ht 67.0 in | Wt 180.0 lb

## 2017-07-21 DIAGNOSIS — M19011 Primary osteoarthritis, right shoulder: Secondary | ICD-10-CM

## 2017-07-21 MED ORDER — METHYLPREDNISOLONE ACETATE 40 MG/ML IJ SUSP
40.0000 mg | Freq: Once | INTRAMUSCULAR | Status: AC
  Administered 2017-07-21: 40 mg via INTRA_ARTICULAR

## 2017-07-21 NOTE — Progress Notes (Signed)
  Phillip [name] - 83 y.o. male MRN [id number]  Date of birth: 1929-10-01  SUBJECTIVE:  Including CC & ROS.  No chief complaint on file. R shoulder pain Patient previously seen approximately 3 weeks ago with R shoulder pain. At that time diagnosis not clear. Patient given sub acromial injection and shoulder Xrays obtained. Xray showed severe gleno-humeral OA. Patient reports some benefit from injection but not much. He is still able to play tennis but has not been serving because this was too painful. Other overhead activity continues to give him pain.   Denies numbness, tingling, swelling, erythema.    HISTORY: Past Medical, Surgical, Social, and Family History Reviewed & Updated per EMR.   Pertinent Historical Findings include: Shoulder pain, HTN  DATA REVIEWED: Right shoulder Xray 06/26/17 Osteoarthritic change, more severe in the glenohumeral joint than acromioclavicular joint. No fracture or dislocation.  PHYSICAL EXAM:  VS: BP:138/88  HR: bpm  TEMP: ( )  RESP:   HT:5\' 7"  (170.2 cm)   WT:180 lb (81.6 kg)  BMI:28.3 PHYSICAL EXAM: Right shoulder:  Inspection with normal appearing joint without obvious deformity or erythema. Joint is not warm to touch. There is no swelling. Patient has good ROM. He has pain with forward flexion and abduction past 90 degrees.  He has no tenderness to palpation over the acromioclavicular joint or bicipital groove His rotator cuff strength is 5/5 and is not reproducible of symptoms.  He does have pain with Hawkins testing and Neer testing.  Negative empty can.   He is neurovascularly intact distally.  ASSESSMENT & PLAN: See problem based charting & AVS for pt instructions. Right shoulder osteoarthritis  Patient with significant osteoarthritis on imaging. Previous subacromial injection without significant benefit. Discussed glenohumeral injection which may be of more benefit to patient as it seems like most of symptoms are related to glenohumeral OA.  Patient would like to proceed with this. Patient will f/u as needed after injection to let us know if this worked.   Right shoulder injection (glenohumeral) Consent obtained and verified. Time-out conducted. Noted no overlying erythema, induration, or other signs of local infection. Skin prepped in a sterile fashion. Topical analgesic spray: Ethyl chloride. Joint: Right shoulder glenohumeral Needle: 25 gauge 1.5 inch Completed without difficulty. Meds: 3cc xylocain, 1cc (40mg ) depomedrol  Advised to call if fevers/chills, erythema, induration, drainage, or persistent bleeding.

## 2017-07-28 ENCOUNTER — Other Ambulatory Visit: Payer: Self-pay | Admitting: *Deleted

## 2017-07-28 DIAGNOSIS — E785 Hyperlipidemia, unspecified: Secondary | ICD-10-CM

## 2017-07-28 DIAGNOSIS — I10 Essential (primary) hypertension: Secondary | ICD-10-CM

## 2017-08-18 ENCOUNTER — Other Ambulatory Visit: Payer: Self-pay | Admitting: Internal Medicine

## 2017-09-01 DIAGNOSIS — H2511 Age-related nuclear cataract, right eye: Secondary | ICD-10-CM | POA: Diagnosis not present

## 2017-09-02 DIAGNOSIS — H2512 Age-related nuclear cataract, left eye: Secondary | ICD-10-CM | POA: Diagnosis not present

## 2017-09-15 DIAGNOSIS — H2512 Age-related nuclear cataract, left eye: Secondary | ICD-10-CM | POA: Diagnosis not present

## 2017-09-22 DIAGNOSIS — I10 Essential (primary) hypertension: Secondary | ICD-10-CM | POA: Diagnosis not present

## 2017-09-22 DIAGNOSIS — E785 Hyperlipidemia, unspecified: Secondary | ICD-10-CM | POA: Diagnosis not present

## 2017-09-22 LAB — COMPREHENSIVE METABOLIC PANEL
AG Ratio: 1.6 (calc) (ref 1.0–2.5)
ALBUMIN MSPROF: 3.9 g/dL (ref 3.6–5.1)
ALKALINE PHOSPHATASE (APISO): 81 U/L (ref 40–115)
ALT: 11 U/L (ref 9–46)
AST: 17 U/L (ref 10–35)
BUN/Creatinine Ratio: 12 (calc) (ref 6–22)
BUN: 15 mg/dL (ref 7–25)
CO2: 26 mmol/L (ref 20–32)
CREATININE: 1.23 mg/dL — AB (ref 0.70–1.11)
Calcium: 9.2 mg/dL (ref 8.6–10.3)
Chloride: 108 mmol/L (ref 98–110)
Globulin: 2.5 g/dL (calc) (ref 1.9–3.7)
Glucose, Bld: 89 mg/dL (ref 65–99)
Potassium: 3.7 mmol/L (ref 3.5–5.3)
Sodium: 142 mmol/L (ref 135–146)
Total Bilirubin: 0.5 mg/dL (ref 0.2–1.2)
Total Protein: 6.4 g/dL (ref 6.1–8.1)

## 2017-09-22 LAB — LIPID PANEL
CHOL/HDL RATIO: 3.1 (calc) (ref <5.0)
Cholesterol: 132 mg/dL (ref <200)
HDL: 43 mg/dL (ref >40)
LDL Cholesterol (Calc): 64 mg/dL (calc)
NON-HDL CHOLESTEROL (CALC): 89 mg/dL (ref <130)
Triglycerides: 171 mg/dL — ABNORMAL HIGH (ref <150)

## 2017-09-24 DIAGNOSIS — Z23 Encounter for immunization: Secondary | ICD-10-CM | POA: Diagnosis not present

## 2017-09-30 ENCOUNTER — Encounter: Payer: Self-pay | Admitting: Internal Medicine

## 2017-10-01 ENCOUNTER — Encounter: Payer: Self-pay | Admitting: Internal Medicine

## 2017-10-01 ENCOUNTER — Non-Acute Institutional Stay: Payer: Medicare Other | Admitting: Internal Medicine

## 2017-10-01 ENCOUNTER — Other Ambulatory Visit: Payer: Self-pay | Admitting: Rheumatology

## 2017-10-01 ENCOUNTER — Other Ambulatory Visit: Payer: Self-pay | Admitting: Internal Medicine

## 2017-10-01 VITALS — BP 126/62 | HR 65 | Temp 97.9°F | Resp 18 | Ht 67.0 in | Wt 188.8 lb

## 2017-10-01 DIAGNOSIS — E785 Hyperlipidemia, unspecified: Secondary | ICD-10-CM

## 2017-10-01 DIAGNOSIS — M19011 Primary osteoarthritis, right shoulder: Secondary | ICD-10-CM | POA: Diagnosis not present

## 2017-10-01 DIAGNOSIS — N400 Enlarged prostate without lower urinary tract symptoms: Secondary | ICD-10-CM

## 2017-10-01 DIAGNOSIS — N3000 Acute cystitis without hematuria: Secondary | ICD-10-CM

## 2017-10-01 DIAGNOSIS — K219 Gastro-esophageal reflux disease without esophagitis: Secondary | ICD-10-CM | POA: Diagnosis not present

## 2017-10-01 DIAGNOSIS — I482 Chronic atrial fibrillation, unspecified: Secondary | ICD-10-CM

## 2017-10-01 DIAGNOSIS — I10 Essential (primary) hypertension: Secondary | ICD-10-CM | POA: Diagnosis not present

## 2017-10-01 DIAGNOSIS — H9193 Unspecified hearing loss, bilateral: Secondary | ICD-10-CM | POA: Diagnosis not present

## 2017-10-01 DIAGNOSIS — Z7901 Long term (current) use of anticoagulants: Secondary | ICD-10-CM | POA: Diagnosis not present

## 2017-10-01 DIAGNOSIS — N401 Enlarged prostate with lower urinary tract symptoms: Secondary | ICD-10-CM | POA: Insufficient documentation

## 2017-10-01 DIAGNOSIS — N138 Other obstructive and reflux uropathy: Secondary | ICD-10-CM | POA: Insufficient documentation

## 2017-10-01 DIAGNOSIS — R350 Frequency of micturition: Secondary | ICD-10-CM

## 2017-10-01 MED ORDER — SACCHAROMYCES BOULARDII 250 MG PO CAPS: 250.0000 mg | ORAL_CAPSULE | Freq: Two times a day (BID) | ORAL | 0 refills | Status: DC

## 2017-10-01 MED ORDER — NITROFURANTOIN MONOHYD MACRO 100 MG PO CAPS: 100.0000 mg | ORAL_CAPSULE | Freq: Two times a day (BID) | ORAL | 0 refills | Status: DC

## 2017-10-01 MED ORDER — PHENAZOPYRIDINE HCL 100 MG PO TABS: 100.0000 mg | ORAL_TABLET | Freq: Two times a day (BID) | ORAL | 0 refills | Status: DC

## 2017-10-01 MED ORDER — IRBESARTAN 300 MG PO TABS: 300.0000 mg | ORAL_TABLET | Freq: Every day | ORAL | 3 refills | Status: DC

## 2017-10-01 NOTE — Patient Instructions (Addendum)
Start taking your antibiotic twice a day for 1 week for urine infection. We will call you back with urine result.  Check your blood pressure once a week and bring reading for review next visit  Make sure to take your aleeve with food.  Stop taking Motrin

## 2017-10-01 NOTE — Progress Notes (Signed)
[facility]  Provider: Blanchie Serve MD   Location:  [place] of Service:  Clinic (12)  PCP: Blanchie Serve, MD Patient Care Team: Blanchie Serve, MD as PCP - General (Internal Medicine) Jarome Matin, MD as Consulting Physician (Dermatology) [place], [place], Nelda Bucks, NP as Nurse Practitioner [facility] Medicine)  Extended Emergency Contact Information Primary Emergency Contact: [facility] Address: [place], [place] [address] [address] of [place] Phone: [phone] Relation: Daughter Secondary Emergency Contact: Filiberto Pinks Address: [address]          [place],  [address] [place] Phone: [phone] Relation: Spouse  Goals of Care: Advanced Directive information Advanced Directives 05/27/2017  Does Patient Have a Medical Advance Directive? No  Type of Advance Directive -  Does patient want to make changes to medical advance directive? -  Copy of [place] in Chart? -      Chief Complaint  Patient presents with  . Medical Management of Chronic Issues    4 month follow up. Patient stated that he had sepsis back in March, he stated that in the past week he feels like hes getting a UTI he has burning and pain with urination. Patient stated that he feels fatigued. He feels that he could be septic again.   . Medication Refill    No refills needed at this time  . Results    Discuss labs     HPI: Patient is a 83 y.o. male seen today for routine visit. He has urinary complaint for 1 week now.   Right shoulder OA- on naproxen after playing tennis and tylenol as needed. Has not taken motrin. Naproxen helps some. S/p cortisone injection to subacrominal joint.   gerd- takes ranitidine and this is helpful, denies reflux symptom as long as he takes ranitidine  HTN- taking toprol xl 50 mg daily, amlodipine 2.5 mg daily and valsartan 320 mg  daily. BP has been controlled.   afib- currently on toprol and eliquis, denies palpitation.  BPH- currently on terazosin, denies symptom  hyperlipidemia- currently on simvastatin 10 mg daily  Past Medical History:  Diagnosis Date  . Atrial fibrillation ([place]) 03/26/2011  . Basal cell carcinoma of other specified sites of skin 4/10/012  . Basal cell carcinoma of skin, site unspecified 03/25/2012  . Benign neoplasm of colon 03/25/2012   adenomatous polyps  . Colon polyp 2014  . Degeneration of lumbar or lumbosacral intervertebral disc 09/24/2011  . Disturbance of skin sensation 01/28/2012  . Diverticulosis 2014  . Dysphagia, unspecified(787.20) 03/26/2011  . Elevated prostate specific antigen (PSA) 03/26/2011  . Exposure to unspecified radiation 03/26/2011  . Family history of colon cancer 07/16/2013  . GERD (gastroesophageal reflux disease) 07/16/2013  . Hearing deficit 12/21/2013  . Hemangioma of unspecified site 03/26/2011  . Hx of adenomatous colonic polyps 07/16/2013  . Hypertrophy of prostate with urinary obstruction and other lower urinary tract symptoms (LUTS) 03/26/2011  . Ingrowing nail 03/26/2011  . Internal hemorrhoids without mention of complication 01/22/77  . Keratosis, actinic 01/28/2012  . Long term (current) use of anticoagulants 03/26/2011  . Long term current use of anticoagulant therapy 07/23/2016  . Lumbago 03/26/2011  . 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 Osteoarthrosis, unspecified whether generalized or localized, unspecified site 11/24/2012  . Other and unspecified hyperlipidemia 03/26/2011  . Other  malaise and fatigue 03/26/2011  . Pain in joint, forearm 11/24/2012  . Pain in joint, lower leg 06/15/13   left knee  . Paresthesia of left arm 07/05/2014  . Rectal bleed 06/15/13  . Reflux esophagitis 03/26/2011  . Sacroiliitis, not elsewhere classified ([place]) 05/28/2011  . Scoliosis (and kyphoscoliosis), idiopathic 05/28/2011  . 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 Tinnitus 12/21/2013  . 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 Unspecified essential hypertension 03/26/2011   Past Surgical History:  Procedure Laterality Date  . BASAL CELL CARCINOMA EXCISION Left 12/10/14   Dr. Jarome Matin  . COLONOSCOPY  08/12/2013   Dr. Hilarie Fredrickson adenomatous polyps  . CRYOTHERAPY Left 2009   hand  . PHOTODYNAMIC THERAPY  09/26/14   face and ear lobs  Dr. Jarome Matin  . SQUAMOUS CELL CARCINOMA EXCISION Left 2010   hand    reports that he has never smoked. He has never used smokeless tobacco. He reports that he does not drink alcohol or use drugs. Social History   Social History  . Marital status: Married    Spouse name: N/A  . Number of children: N/A  . Years of education: N/A   Occupational History  . retired [employer] Retired   Social History Main Topics  . Smoking status: Never Smoker  . Smokeless tobacco: Never Used  . Alcohol use No  . Drug use: No  . Sexual activity: Not on file   Other Topics Concern  . Not on file   Social History Narrative   Lives at [facility] since 11/2010   Married - [employer]   Does not have Living Will   Never smoked   Alcohol none   Exercise: tennis, pool, table tennis 2-3 times a week    Functional Status Survey:    Family History  Problem Relation Age of Onset  . Cancer Mother        colon  . Heart disease Father        MI    Health Maintenance  Topic Date Due  . TETANUS/TDAP  02/15/2016  . COLONOSCOPY  08/12/2018  . INFLUENZA VACCINE  Completed  . PNA vac Low Risk Adult  Completed    Allergies  Allergen Reactions  . Ciprofloxacin Other (See Comments)    Patient doesn't like the way it makes him feel  . Zestril [Lisinopril]     unknown  . Sulfa Antibiotics Rash    Outpatient Encounter Prescriptions as of 10/01/2017  Medication Sig  . acetaminophen  (TYLENOL) 325 MG tablet Take 650 mg by mouth as needed for mild pain.  Marland Kitchen amLODipine (NORVASC) 2.5 MG tablet TAKE 1 TABLET DAILY TO CONTROL BLOOD PRESSURE  . ELIQUIS 5 MG TABS tablet TAKE 1 TABLET TWICE A DAY FOR ANTICOAGULATION  . finasteride (PROSCAR) 5 MG tablet TAKE 1 TABLET ONCE DAILY FOR URINATING DRIBBLING/PROSTATE ENLARGEMENT  . ibuprofen (ADVIL,MOTRIN) 200 MG tablet Take 200 mg by mouth as needed for mild pain.  . naproxen sodium (ANAPROX) 220 MG tablet Take 220 mg by mouth. Take one daily after playing tennis  . ranitidine (ZANTAC) 150 MG tablet TAKE 1 TABLET TWICE A DAY TO REDUCE STOMACH ACID  . simvastatin (ZOCOR) 10 MG tablet TAKE 1 TABLET DAILY TO LOWER CHOLESTEROL  . terazosin (HYTRIN) 2 MG capsule TAKE 1 CAPSULE DAILY  . TOPROL XL 50 MG 24 hr tablet TAKE 1 TABLET TWICE  A DAY TO CONTROL BLOOD PRESSURE AND HEART RHYTHM  . valsartan (DIOVAN) 320 MG tablet TAKE 1 TABLET DAILY TO CONTROL BLOOD PRESSURE  . [DISCONTINUED] acetaminophen (TYLENOL) 325 MG tablet Take 2 tablets (650 mg total) by mouth every 6 (six) hours as needed for mild pain (or Fever >/= 101).  . [DISCONTINUED] ibuprofen (MOTRIN IB) 200 MG tablet Take 1 tablet (200 mg total) by mouth every 6 (six) hours as needed.   No facility-administered encounter medications on file as of 10/01/2017.     Review of Systems  Constitutional: Positive for fatigue. Negative for appetite change, chills, diaphoresis and fever.  HENT: Positive for hearing loss and rhinorrhea. Negative for congestion, ear discharge, ear pain, mouth sores, nosebleeds, postnasal drip, sinus pain, sinus pressure, sore throat and trouble swallowing.   Eyes: Positive for visual disturbance. Negative for pain and itching.       S/p cataract surgery and is awaiting his new prescription glasses  Respiratory: Negative for cough, choking, shortness of breath and wheezing.   Cardiovascular: Negative for chest pain, palpitations and leg swelling.  Gastrointestinal:  Positive for constipation. Negative for abdominal pain, blood in stool, diarrhea, nausea and vomiting.  Genitourinary: Positive for dysuria, hematuria and urgency. Negative for discharge and scrotal swelling.       Dysuria for 1 week. Denies flank pain or groin area. There has been increase in his frequency and now has urgency as well.   Musculoskeletal: Positive for arthralgias. Negative for back pain, gait problem and joint swelling.       No fall reported.  Skin: Negative for rash and wound.  Neurological: Negative for dizziness, syncope, numbness and headaches.  Hematological: Bruises/bleeds easily.  Psychiatric/Behavioral: Negative for behavioral problems, confusion and sleep disturbance. The patient is not nervous/anxious.     Vitals:   10/01/17 0954  BP: 126/62  Pulse: 65  Resp: 18  Temp: 97.9 F (36.6 C)  TempSrc: Oral  SpO2: 95%  Weight: 188 lb 12.8 oz (85.6 kg)  Height: 5\' 7"  (1.702 m)   Body mass index is 29.57 kg/m. Physical Exam  Constitutional: He is oriented to person, place, and time. He appears well-developed and well-nourished. No distress.  HENT:  Head: Normocephalic and atraumatic.  Right Ear: External ear normal.  Left Ear: External ear normal.  Nose: Nose normal.  Mouth/Throat: Oropharynx is clear and moist. No oropharyngeal exudate.  Eyes: Pupils are equal, round, and reactive to light. Conjunctivae and EOM are normal. Right eye exhibits no discharge. Left eye exhibits no discharge. No scleral icterus.  Neck: Normal range of motion. Neck supple. No thyromegaly present.  Cardiovascular: Normal rate, regular rhythm and intact distal pulses.   Pulmonary/Chest: Effort normal and breath sounds normal. No respiratory distress. He has no wheezes. He has no rales.  Abdominal: Soft. Bowel sounds are normal. He exhibits no distension. There is no tenderness. There is no rebound and no guarding.  Musculoskeletal: Normal range of motion. He exhibits no edema.  Mild  stooping.   Lymphadenopathy:    He has no cervical adenopathy.  Neurological: He is alert and oriented to person, place, and time.  Skin: Skin is warm and dry. No rash noted. He is not diaphoretic.  Psychiatric: He has a normal mood and affect. His behavior is normal.    Labs reviewed: Basic Metabolic Panel:  Recent Labs  03/24/17 0756 05/16/17 0800 09/22/17 0000  NA 140 145 142  K 4.3 3.8 3.7  CL 105 111* 108  CO2 25  24 26  GLUCOSE 90 95 89  BUN 12 13 15   CREATININE 1.08 1.21* 1.23*  CALCIUM 8.9 9.0 9.2   Liver Function Tests:  Recent Labs  03/11/17 0501 03/24/17 0756 05/16/17 0800 09/22/17 0000  AST 21 16 15 17   ALT 11* 12 9 11   ALKPHOS 55 79 70  --   BILITOT 1.0 0.4 0.7 0.5  PROT 5.9* 6.4 6.3 6.4  ALBUMIN 2.7* 3.4* 3.7  --    No results for input(s): LIPASE, AMYLASE in the last 8760 hours. No results for input(s): AMMONIA in the last 8760 hours. CBC:  Recent Labs  03/10/17 0850  03/11/17 1443 03/12/17 0440 03/13/17 0444  WBC 18.6*  < > 16.6* 16.9* 11.4*  NEUTROABS 16.9*  --   --   --   --   HGB 13.8  < > 11.6* 11.0* 11.3*  HCT 38.2*  < > 33.4* 30.9* 32.5*  MCV 88.6  < > 91.3 90.6 88.6  PLT 136*  < > 137* 144* 153  < > = values in this interval not displayed. Cardiac Enzymes: No results for input(s): CKTOTAL, CKMB, CKMBINDEX, TROPONINI in the last 8760 hours. BNP: Invalid input(s): POCBNP No results found for: HGBA1C No results found for: TSH No results found for: VITAMINB12 No results found for: FOLATE No results found for: IRON, TIBC, FERRITIN  Lipid Panel:  Recent Labs  03/24/17 0756 05/16/17 0800 09/22/17 0000  CHOL 110 132 132  HDL 31* 45 43  LDLCALC 56 61  --   TRIG 114 128 171*  CHOLHDL 3.5 2.9 3.1   No results found for: HGBA1C  Procedures since last visit: No results found.  Assessment/Plan  1. Acute cystitis without hematuria Encouraged hydration. Follow up on culture report.  - nitrofurantoin,  macrocrystal-monohydrate, (MACROBID) 100 MG capsule; Take 1 capsule (100 mg total) by mouth 2 (two) times daily.  Dispense: 14 capsule; Refill: 0 - saccharomyces boulardii (FLORASTOR) 250 MG capsule; Take 1 capsule (250 mg total) by mouth 2 (two) times daily.  Dispense: 20 capsule; Refill: 0 - phenazopyridine (PYRIDIUM) 100 MG tablet; Take 1 tablet (100 mg total) by mouth 2 (two) times daily.  Dispense: 10 tablet; Refill: 0 - Urinalysis with Reflex Microscopic; Future - Culture, Urine; Future  2. Essential hypertension D/c valsartan with recall. Start irbesartan as below. Continue toprol xl 50 mg daily, amlodipine 2.5 mg daily. Check BP once a week at home. Reviewed BMP.  - irbesartan (AVAPRO) 300 MG tablet; Take 1 tablet (300 mg total) by mouth daily.  Dispense: 90 tablet; Refill: 3  3. Chronic atrial fibrillation (HCC) Controlled Heart rate. Continue toprol xl and eliquis.   4. Gastroesophageal reflux disease without esophagitis Continue ranitidine 150 mg bid and monitor, controlled symptom.   5. Hearing problem of both ears Did not tolerate hearing aid. Supportive care.   6. Hyperlipidemia, unspecified hyperlipidemia type Lipid Panel     Component Value Date/Time   CHOL 132 09/22/2017 0000   TRIG 171 (H) 09/22/2017 0000   HDL 43 09/22/2017 0000   CHOLHDL 3.1 09/22/2017 0000   VLDL 26 05/16/2017 0800   LDLCALC 61 05/16/2017 0800   LDL at goal but elevated triglyceride. Continue simvastatin 10 mg daily.   7. Long term current use of anticoagulant therapy Continue eliquis for now and monitor.   8. Osteoarthritis of right shoulder, unspecified osteoarthritis type Seen by sports medicine and recently received steroid injection. Continue naproxen prn and tylenol as needed. D/c motrin.  9. BPH  Continue terazosin    Labs/tests ordered:  none  Next appointment: 3-4 months, MMSE  Communication: reviewed care plan with patient    Blanchie Serve, MD Internal  Medicine [place], [place] [address] Cell Phone (Monday-Friday 8 am - 5 pm): [phone] On Call: [phone] and follow prompts after 5 pm and on weekends Office Phone: [phone] Office Fax: [phone]

## 2017-10-02 ENCOUNTER — Encounter: Payer: Self-pay | Admitting: Internal Medicine

## 2017-10-02 DIAGNOSIS — N390 Urinary tract infection, site not specified: Secondary | ICD-10-CM | POA: Diagnosis not present

## 2017-10-02 LAB — TIQ-MISC

## 2017-10-10 ENCOUNTER — Encounter: Payer: Self-pay | Admitting: Internal Medicine

## 2017-10-15 ENCOUNTER — Telehealth: Payer: Self-pay | Admitting: Internal Medicine

## 2017-10-15 NOTE — Telephone Encounter (Signed)
I left a message asking the pt to schedule AWV-I at [facility] clinic on 10/17/17 if available. VDM (DD)

## 2017-11-07 ENCOUNTER — Other Ambulatory Visit: Payer: Self-pay | Admitting: Internal Medicine

## 2017-11-20 ENCOUNTER — Telehealth: Payer: Self-pay | Admitting: Internal Medicine

## 2017-11-20 NOTE — Telephone Encounter (Signed)
Left message asking pt to schedule AWV-I at [facility] clinic on Friday, 11/21/17. VDM (DD)

## 2017-11-25 DIAGNOSIS — S42309A Unspecified fracture of shaft of humerus, unspecified arm, initial encounter for closed fracture: Secondary | ICD-10-CM

## 2017-11-25 HISTORY — DX: Unspecified fracture of shaft of humerus, unspecified arm, initial encounter for closed fracture: S42.309A

## 2017-11-26 ENCOUNTER — Encounter: Payer: Self-pay | Admitting: Internal Medicine

## 2017-11-26 ENCOUNTER — Non-Acute Institutional Stay: Payer: Medicare Other | Admitting: Internal Medicine

## 2017-11-26 VITALS — BP 138/70 | HR 63 | Temp 98.1°F | Resp 18 | Ht 67.0 in | Wt 190.6 lb

## 2017-11-26 DIAGNOSIS — M25532 Pain in left wrist: Secondary | ICD-10-CM | POA: Diagnosis not present

## 2017-11-26 MED ORDER — ACETAMINOPHEN 500 MG PO TABS: 500.0000 mg | ORAL_TABLET | Freq: Three times a day (TID) | ORAL | 0 refills | Status: DC

## 2017-11-26 NOTE — Progress Notes (Signed)
[facility]  Provider: Blanchie Serve MD   Location:  [place] of Service:  Clinic (12)  PCP: Blanchie Serve, MD Patient Care Team: Blanchie Serve, MD as PCP - General (Internal Medicine) Jarome Matin, MD as Consulting Physician (Dermatology) [place], [place], Nelda Bucks, NP as Nurse Practitioner [facility] Medicine)  Extended Emergency Contact Information Primary Emergency Contact: [facility] Address: [place], [place] [address] Johnnette Litter of [place] Phone: [phone] Relation: Daughter Secondary Emergency Contact: Filiberto Pinks Address: [address]          [place], [place] [address] [place] of [place] Phone: [phone] Relation: Spouse  Goals of Care: Advanced Directive information Advanced Directives 05/27/2017  Does Patient Have a Medical Advance Directive? No  Type of Advance Directive -  Does patient want to make changes to medical advance directive? -  Copy of [place] in Chart? -     Chief Complaint  Patient presents with  . Acute Visit    Left wrist swollen due to a fall on black ice last night. No pain when his wrist is still but when he moves it thats when he has the pain.     HPI: Patient is a 83 y.o. male seen today for acute visit. He had a fall last night on ice and now has pain with swelling to his left wrist. He has pain with movement. He took aleeve last night for pain.pain is at his wrist with movement. Denies pain to his fingers or forearm. Denies new numbness or tingling to finger tips.   Past Medical History:  Diagnosis Date  . Atrial fibrillation ([place]) 03/26/2011  . Basal cell carcinoma of other specified sites of skin 4/10/012  . Basal cell carcinoma of skin, site unspecified 03/25/2012  . Benign neoplasm of colon 03/25/2012   adenomatous polyps  . Colon polyp 2014  . Degeneration of lumbar or lumbosacral intervertebral disc  09/24/2011  . Disturbance of skin sensation 01/28/2012  . Diverticulosis 2014  . Dysphagia, unspecified(787.20) 03/26/2011  . Elevated prostate specific antigen (PSA) 03/26/2011  . Exposure to unspecified radiation 03/26/2011  . Family history of colon cancer 07/16/2013  . GERD (gastroesophageal reflux disease) 07/16/2013  . Hearing deficit 12/21/2013  . Hemangioma of unspecified site 03/26/2011  . Hx of adenomatous colonic polyps 07/16/2013  . Hypertrophy of prostate with urinary obstruction and other lower urinary tract symptoms (LUTS) 03/26/2011  . Ingrowing nail 03/26/2011  . Internal hemorrhoids without mention of complication 03/17/31  . Keratosis, actinic 01/28/2012  . Long term (current) use of anticoagulants 03/26/2011  . Long term current use of anticoagulant therapy 07/23/2016  . Lumbago 03/26/2011  . 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 Osteoarthrosis, unspecified whether generalized or localized, unspecified site 11/24/2012  . Other and unspecified hyperlipidemia 03/26/2011  . Other malaise and fatigue 03/26/2011  . Pain in joint, forearm 11/24/2012  . Pain in joint, lower leg 06/15/13   left knee  . Paresthesia of left arm 07/05/2014  . Rectal bleed 06/15/13  . Reflux esophagitis 03/26/2011  . Sacroiliitis, not elsewhere classified ([place]) 05/28/2011  . Scoliosis (and kyphoscoliosis), idiopathic 05/28/2011  . 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 Tinnitus 12/21/2013  . 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 Unspecified essential hypertension  03/26/2011   Past Surgical History:  Procedure Laterality Date  . BASAL CELL CARCINOMA EXCISION Left 12/10/14   Dr. Jarome Matin  . COLONOSCOPY  08/12/2013   Dr. Hilarie Fredrickson adenomatous polyps  . CRYOTHERAPY Left 2009   hand  . PHOTODYNAMIC THERAPY  09/26/14   face and ear lobs  Dr. Jarome Matin  . SQUAMOUS CELL CARCINOMA EXCISION Left  2010   hand    reports that  has never smoked. he has never used smokeless tobacco. He reports that he does not drink alcohol or use drugs. Social History   Socioeconomic History  . Marital status: Married    Spouse name: Not on file  . Number of children: Not on file  . Years of education: Not on file  . Highest education level: Not on file  Social Needs  . Financial resource strain: Not on file  . Food insecurity - worry: Not on file  . Food insecurity - inability: Not on file  . Transportation needs - medical: Not on file  . Transportation needs - non-medical: Not on file  Occupational History  . 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 Smoking status: Never Smoker  . Smokeless tobacco: Never Used  Substance and Sexual Activity  . Alcohol use: No  . Drug use: No  . Sexual activity: Not on file  Other Topics Concern  . Not on file  Social History Narrative   Lives at [facility] since 11/2010   Married - [employer]   Does not have Living Will   Never smoked   Alcohol none   Exercise: tennis, pool, table tennis 2-3 times a week     Family History  Problem Relation Age of Onset  . Cancer Mother        colon  . Heart disease Father        MI    Health Maintenance  Topic Date Due  . TETANUS/TDAP  02/15/2016  . COLONOSCOPY  08/12/2018  . INFLUENZA VACCINE  Completed  . PNA vac Low Risk Adult  Completed    Allergies  Allergen Reactions  . Ciprofloxacin Other (See Comments)    Patient doesn't like the way it makes him feel  . Zestril [Lisinopril]     unknown  . Sulfa Antibiotics Rash    Outpatient Encounter Medications as of 11/26/2017  Medication Sig  . amLODipine (NORVASC) 2.5 MG tablet TAKE 1 TABLET DAILY TO CONTROL BLOOD PRESSURE  . ELIQUIS 5 MG TABS tablet TAKE 1 TABLET TWICE A DAY FOR ANTICOAGULATION  . finasteride (PROSCAR) 5 MG tablet TAKE 1 TABLET ONCE DAILY FOR URINATING DRIBBLING/PROSTATE  ENLARGEMENT  . irbesartan (AVAPRO) 300 MG tablet Take 1 tablet (300 mg total) by mouth daily.  . naproxen sodium (ANAPROX) 220 MG tablet Take 220 mg by mouth. Take one daily after playing tennis  . ranitidine (ZANTAC) 150 MG tablet TAKE 1 TABLET TWICE A DAY TO REDUCE STOMACH ACID  . simvastatin (ZOCOR) 10 MG tablet TAKE 1 TABLET DAILY TO LOWER CHOLESTEROL  . terazosin (HYTRIN) 2 MG capsule TAKE 1 CAPSULE DAILY  . TOPROL XL 50 MG 24 hr tablet TAKE 1 TABLET TWICE A DAY TO CONTROL BLOOD PRESSURE AND HEART RHYTHM  . [DISCONTINUED] acetaminophen (TYLENOL) 325 MG tablet Take 650 mg by mouth as needed for mild pain.  Marland Kitchen acetaminophen (TYLENOL) 500 MG tablet Take 1 tablet (500 mg total) by mouth 3 (three) times daily.  . [  DISCONTINUED] nitrofurantoin, macrocrystal-monohydrate, (MACROBID) 100 MG capsule Take 1 capsule (100 mg total) by mouth 2 (two) times daily.  . [DISCONTINUED] phenazopyridine (PYRIDIUM) 100 MG tablet Take 1 tablet (100 mg total) by mouth 2 (two) times daily.  . [DISCONTINUED] saccharomyces boulardii (FLORASTOR) 250 MG capsule Take 1 capsule (250 mg total) by mouth 2 (two) times daily.   No facility-administered encounter medications on file as of 11/26/2017.     Review of Systems  Constitutional: Negative for fever.  HENT: Positive for hearing loss.   Respiratory: Negative for shortness of breath.   Cardiovascular: Negative for chest pain.  Musculoskeletal: Positive for arthralgias and joint swelling.    Vitals:   11/26/17 1108  BP: 138/70  Pulse: 63  Resp: 18  Temp: 98.1 F (36.7 C)  TempSrc: Oral  SpO2: 96%  Weight: 190 lb 9.6 oz (86.5 kg)  Height: 5\' 7"  (1.702 m)   Body mass index is 29.85 kg/m. Physical Exam  Constitutional: He is oriented to person, place, and time.  Overweight, elderly male in no acute distress  HENT:  Head: Normocephalic and atraumatic.  Neck: Neck supple.  Musculoskeletal:  Left wrist swollen with bruise, good radial pulse, flexion and  extension limited with pain, able to move his fingers and make a fist. Tenderness on wrist joint on palpation  Lymphadenopathy:    He has no cervical adenopathy.  Neurological: He is alert and oriented to person, place, and time.  Skin: Skin is warm and dry. He is not diaphoretic.  Psychiatric: He has a normal mood and affect.    Labs reviewed: Basic Metabolic Panel: Recent Labs    03/24/17 0756 05/16/17 0800 09/22/17 0000  NA 140 145 142  K 4.3 3.8 3.7  CL 105 111* 108  CO2 25 24 26   GLUCOSE 90 95 89  BUN 12 13 15   CREATININE 1.08 1.21* 1.23*  CALCIUM 8.9 9.0 9.2   Liver Function Tests: Recent Labs    03/11/17 0501 03/24/17 0756 05/16/17 0800 09/22/17 0000  AST 21 16 15 17   ALT 11* 12 9 11   ALKPHOS 55 79 70  --   BILITOT 1.0 0.4 0.7 0.5  PROT 5.9* 6.4 6.3 6.4  ALBUMIN 2.7* 3.4* 3.7  --    No results for input(s): LIPASE, AMYLASE in the last 8760 hours. No results for input(s): AMMONIA in the last 8760 hours. CBC: Recent Labs    03/10/17 0850  03/11/17 1443 03/12/17 0440 03/13/17 0444  WBC 18.6*   < > 16.6* 16.9* 11.4*  NEUTROABS 16.9*  --   --   --   --   HGB 13.8   < > 11.6* 11.0* 11.3*  HCT 38.2*   < > 33.4* 30.9* 32.5*  MCV 88.6   < > 91.3 90.6 88.6  PLT 136*   < > 137* 144* 153   < > = values in this interval not displayed.   Cardiac Enzymes: No results for input(s): CKTOTAL, CKMB, CKMBINDEX, TROPONINI in the last 8760 hours. BNP: Invalid input(s): POCBNP No results found for: HGBA1C No results found for: TSH No results found for: VITAMINB12 No results found for: FOLATE No results found for: IRON, TIBC, FERRITIN  Lipid Panel: Recent Labs    03/24/17 0756 05/16/17 0800 09/22/17 0000  CHOL 110 132 132  HDL 31* 45 43  LDLCALC 56 61  --   TRIG 114 128 171*  CHOLHDL 3.5 2.9 3.1   No results found for: HGBA1C  Procedures since last  visit: No results found.  Assessment/Plan  1. Left wrist pain Acute onset post fall after slipping on  ice. Swelling also present. Xray left wrist 3 view stat to rule out fracture. Mobile imaging [place] perform this in [place]. Tylenol extra strength 500 mg tid x 1 week for now and ice pack tid and prn. Advised to get wrist brace for support. Reassess in a week if no improvement.  - acetaminophen (TYLENOL) 500 MG tablet; Take 1 tablet (500 mg total) by mouth 3 (three) times daily.  Dispense: 30 tablet; Refill: 0    Labs/tests ordered:  Xray left wrist.   Next appointment: as before.   Communication: reviewed care plan with patient    Blanchie Serve, MD Internal Medicine [place], [place] [address] Cell Phone (Monday-Friday 8 am - 5 pm): [phone] On Call: [phone] and follow prompts after 5 pm and on weekends Office Phone: [phone] Office Fax: [phone]

## 2017-11-27 ENCOUNTER — Telehealth: Payer: Self-pay | Admitting: *Deleted

## 2017-11-27 ENCOUNTER — Encounter (INDEPENDENT_AMBULATORY_CARE_PROVIDER_SITE_OTHER): Payer: Self-pay | Admitting: Orthopaedic Surgery

## 2017-11-27 ENCOUNTER — Ambulatory Visit (INDEPENDENT_AMBULATORY_CARE_PROVIDER_SITE_OTHER): Payer: Medicare Other

## 2017-11-27 ENCOUNTER — Ambulatory Visit (INDEPENDENT_AMBULATORY_CARE_PROVIDER_SITE_OTHER): Payer: Medicare Other | Admitting: Orthopaedic Surgery

## 2017-11-27 DIAGNOSIS — S52532A Colles' fracture of left radius, initial encounter for closed fracture: Secondary | ICD-10-CM | POA: Insufficient documentation

## 2017-11-27 DIAGNOSIS — M25532 Pain in left wrist: Secondary | ICD-10-CM

## 2017-11-27 NOTE — Telephone Encounter (Signed)
Patient scheduled today at 3:15 at [facility] with Dr. Ninfa Linden. Patient is aware. Info faxed to ortho office.

## 2017-11-27 NOTE — Progress Notes (Signed)
Office Visit Note   Patient: Phillip [name]           Date of Birth: 1929/10/20           MRN: [id number] Visit Date: 11/27/2017              Requested by: Blanchie Serve, MD [address] [place], [place] [address] PCP: Blanchie Serve, MD   Assessment & Plan: Visit Diagnoses:  1. Pain in left wrist   2. Closed Colles' fracture of left radius, initial encounter     Plan: At his age of 83 and this being his nondominant side he prefer nonoperative treatment.  I was able to gently manipulate the wrist pushing in a downward position and hopefully this will improve the alignment.  The swelling is not severe enough to awaken least put him in a short arm cast today.  All questions and concerns were answered and addressed.  I like to see him back next week with an AP and lateral of the left wrist in his cast.  If we need to cane change the past we certainly could at that standpoint hopefully though this would be something we will have him in a cast for about 4 weeks.  Again next week we will see him back for repeat AP and lateral of his left wrist in his short arm cast.  He understands that he is to keep the cast clean and dry.  Follow-Up Instructions: Return in about 1 week (around 12/04/2017).   Orders:  Orders Placed This Encounter  Procedures  . XR Wrist Complete Left   No orders of the defined types were placed in this encounter.     Procedures: No procedures performed   Clinical Data: No additional findings.   Subjective: No chief complaint on file. Patient is a very pleasant right-hand-dominant 83 year old who not prolapse slipped on black ice injuring his left nondominant wrist.  He was worked in today due to wrist pain and swelling.  He denies any numbness and tingling in that hand.  Denies any other injuries to his left upper extremity or anywhere else is related to this fall.  Is mainly in his left wrist.  HPI  Review of Systems He currently denies any headache, chest  pain, shortness of breath, fever, chills, nausea, vomiting.  Objective: Vital Signs: There were no vitals taken for this visit.  Physical Exam He is alert and oriented x3 and in no acute distress Ortho Exam examination of his left wrist does show some slight swelling.  Clinically his wrist appears straight.  He is neurovascular intact.  He has painful to palpation over the tip of the dorsiflexion palmar flexion does cause pain as well as radial and ulnar deviation.  He has palpable pulses in the wrist and a well-perfused hand. Specialty Comments:  No specialty comments available.  Imaging: Xr Wrist Complete Left  Result Date: 11/27/2017 3 views of the left wrist show a distal radius fracture that appears mainly extra-articular but there may be a small intra-articular component.  There is slight dorsal angulation.  There is a tip of the ulnar styloid fracture.    PMFS History: Patient Active Problem List   Diagnosis Date Noted  . Fracture, Colles, left, closed 11/27/2017  . Osteoarthritis of right shoulder 10/01/2017  . Benign prostatic hyperplasia without lower urinary tract symptoms 10/01/2017  . Advanced care planning/counseling discussion 03/18/2017  . Epididymoorchitis 03/11/2017  . Sepsis ([place]) 03/10/2017  . Testicular pain 03/10/2017  .  Long term current use of anticoagulant therapy 07/23/2016  . Blurred vision 01/23/2016  . Paresthesia of right arm 07/18/2015  . Insomnia 07/18/2015  . Lumbago 07/18/2015  . Basal cell carcinoma, leg 01/12/2015  . Pain in right shoulder 11/24/2014  . Neck pain 11/24/2014  . Paresthesia of left arm 07/05/2014  . Tinnitus 12/21/2013  . Hearing deficit 12/21/2013  . Hx of adenomatous colonic polyps 07/16/2013  . Family history of colon cancer 07/16/2013  . GERD (gastroesophageal reflux disease) 07/16/2013  . Pain in joint, lower leg 06/15/2013  . Internal hemorrhoids without mention of complication 66/44/0347  . Rectal bleed 06/15/2013    . Benign neoplasm of colon   . Atrial fibrillation ([place]) 03/26/2011  . Hyperlipidemia 03/26/2011  . Essential hypertension 03/26/2011  . Elevated prostate specific antigen (PSA) 03/26/2011   Past Medical History:  Diagnosis Date  . Atrial fibrillation ([place]) 03/26/2011  . Basal cell carcinoma of other specified sites of skin 4/10/012  . Basal cell carcinoma of skin, site unspecified 03/25/2012  . Benign neoplasm of colon 03/25/2012   adenomatous polyps  . Colon polyp 2014  . Degeneration of lumbar or lumbosacral intervertebral disc 09/24/2011  . Disturbance of skin sensation 01/28/2012  . Diverticulosis 2014  . Dysphagia, unspecified(787.20) 03/26/2011  . Elevated prostate specific antigen (PSA) 03/26/2011  . Exposure to unspecified radiation 03/26/2011  . Family history of colon cancer 07/16/2013  . GERD (gastroesophageal reflux disease) 07/16/2013  . Hearing deficit 12/21/2013  . Hemangioma of unspecified site 03/26/2011  . Hx of adenomatous colonic polyps 07/16/2013  . Hypertrophy of prostate with urinary obstruction and other lower urinary tract symptoms (LUTS) 03/26/2011  . Ingrowing nail 03/26/2011  . Internal hemorrhoids without mention of complication 03/17/58  . Keratosis, actinic 01/28/2012  . Long term (current) use of anticoagulants 03/26/2011  . Long term current use of anticoagulant therapy 07/23/2016  . Lumbago 03/26/2011  . 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 Osteoarthrosis, unspecified whether generalized or localized, unspecified site 11/24/2012  . Other and unspecified hyperlipidemia 03/26/2011  . Other malaise and fatigue 03/26/2011  . Pain in joint, forearm 11/24/2012  . Pain in joint, lower leg 06/15/13   left knee  . Paresthesia of left arm 07/05/2014  . Rectal bleed 06/15/13  . Reflux esophagitis 03/26/2011  . Sacroiliitis, not elsewhere classified ([place]) 05/28/2011  . Scoliosis (and kyphoscoliosis), idiopathic 05/28/2011  . 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 Tinnitus 12/21/2013  . 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 Unspecified essential hypertension 03/26/2011    Family History  Problem Relation Age of Onset  . Cancer Mother        colon  . Heart disease Father        MI    Past Surgical History:  Procedure Laterality Date  . BASAL CELL CARCINOMA EXCISION Left 12/10/14   Dr. Jarome Matin  . COLONOSCOPY  08/12/2013   Dr. Hilarie Fredrickson adenomatous polyps  . CRYOTHERAPY Left 2009   hand  . PHOTODYNAMIC THERAPY  09/26/14   face and ear lobs  Dr. Jarome Matin  . SQUAMOUS CELL CARCINOMA EXCISION Left 2010   hand   Social History   Occupational History  . 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 Smoking status: Never Smoker  . Smokeless tobacco: Never Used  Substance and Sexual Activity  . Alcohol  use: No  . Drug use: No  . Sexual activity: Not on file

## 2017-11-27 NOTE — Telephone Encounter (Signed)
Noted, non weight bearing to left wrist and hand for now. Pt has a friend taking him to the doctor's office. Thanks

## 2017-11-27 NOTE — Telephone Encounter (Signed)
X-ray report received and showed:  1. "Acute, transverse, nondisplaced fracture at distal radial metaphysis with mild ventral apex angulation is noted"  2. "Acute avulsion fracture at ulnar styloid process is noted."  3. "No other acute fracture or dislocation is seen."  Bard Herbert, NP and the patient were notified last night. She advised for patient to follow up with ortho today. I spoke with patient and he didn't have a current ortho and had no preference for one. I am currently working on a referral for him and will update you as soon as I get an appointment for him to be seen today.

## 2017-12-03 ENCOUNTER — Encounter: Payer: Self-pay | Admitting: Internal Medicine

## 2017-12-03 ENCOUNTER — Encounter (INDEPENDENT_AMBULATORY_CARE_PROVIDER_SITE_OTHER): Payer: Self-pay | Admitting: Orthopaedic Surgery

## 2017-12-03 ENCOUNTER — Ambulatory Visit (INDEPENDENT_AMBULATORY_CARE_PROVIDER_SITE_OTHER): Payer: Medicare Other | Admitting: Orthopaedic Surgery

## 2017-12-03 ENCOUNTER — Ambulatory Visit (INDEPENDENT_AMBULATORY_CARE_PROVIDER_SITE_OTHER): Payer: Medicare Other

## 2017-12-03 DIAGNOSIS — S52532D Colles' fracture of left radius, subsequent encounter for closed fracture with routine healing: Secondary | ICD-10-CM

## 2017-12-03 NOTE — Progress Notes (Signed)
The patient is 1 week status post manipulation and closed reduction of a left distal radius fracture.  He is in a short arm cast.  He is 83 years old and right-hand dominant.  He is flying out of the country this Friday to [place] to visit family.  He says is doing well overall but the cast is inconvenient.  On examination the cast is fitting nicely.  His fingers are well perfused.  2 views of the left distal radius show near anatomic alignment of the fracture.  There is no shortening on the AP view and on the lateral view there is neutral alignment.  At this point we will leave the cast on for 3 weeks.  When he comes back in 3 weeks of the cast removed with a repeat AP and lateral of the left wrist out of the cast and then we will transition him into a Velcro wrist splint.  All questions concerns were answered and addressed.

## 2017-12-06 ENCOUNTER — Other Ambulatory Visit: Payer: Self-pay | Admitting: Internal Medicine

## 2017-12-24 ENCOUNTER — Ambulatory Visit (INDEPENDENT_AMBULATORY_CARE_PROVIDER_SITE_OTHER): Payer: Medicare Other | Admitting: Orthopaedic Surgery

## 2017-12-24 ENCOUNTER — Ambulatory Visit (INDEPENDENT_AMBULATORY_CARE_PROVIDER_SITE_OTHER): Payer: Medicare Other

## 2017-12-24 ENCOUNTER — Encounter (INDEPENDENT_AMBULATORY_CARE_PROVIDER_SITE_OTHER): Payer: Self-pay | Admitting: Orthopaedic Surgery

## 2017-12-24 DIAGNOSIS — S52532D Colles' fracture of left radius, subsequent encounter for closed fracture with routine healing: Secondary | ICD-10-CM | POA: Diagnosis not present

## 2017-12-24 DIAGNOSIS — M1712 Unilateral primary osteoarthritis, left knee: Secondary | ICD-10-CM

## 2017-12-24 NOTE — Progress Notes (Signed)
The patient is now almost a month status post an extra articular distal radius fracture and ulnar styloid fracture.  He is right-hand dominant 84 year old.  He is been in a short arm cast.  He said he did have a very hard fall on New Year's Day and it did hurt the wrist some.  On examination out of the splint his wrist actually looks good clinically with pretty good range of motion overall.  His grip strength and pinch strength are weak but this is mainly due to pain.  His hand is well-perfused.  2 views of the left wrist are obtained and are compared this to previous x-rays.  On the AP view he is in near-anatomic alignment lateral views show some loss of his reduction.  He has some slight dorsal angulation of fracture when before it was in the usual position.  However at 84 years old that he may not have any residual deficits from this.  We went over the x-rays in detail with him.  At this point I want to try a removable wrist splint and have him work on range of motion of his wrist.  I will see him back for final visit in 4 weeks with a repeat 2 views of his left wrist.

## 2017-12-29 ENCOUNTER — Other Ambulatory Visit: Payer: Self-pay | Admitting: Internal Medicine

## 2017-12-31 ENCOUNTER — Non-Acute Institutional Stay: Payer: Medicare Other | Admitting: Internal Medicine

## 2017-12-31 ENCOUNTER — Encounter: Payer: Self-pay | Admitting: Internal Medicine

## 2017-12-31 VITALS — BP 132/64 | HR 60 | Temp 97.8°F | Resp 18 | Ht 67.0 in | Wt 185.6 lb

## 2017-12-31 DIAGNOSIS — R1013 Epigastric pain: Secondary | ICD-10-CM | POA: Diagnosis not present

## 2017-12-31 DIAGNOSIS — N3000 Acute cystitis without hematuria: Secondary | ICD-10-CM | POA: Diagnosis not present

## 2017-12-31 MED ORDER — SIMETHICONE 80 MG PO CHEW: 80.0000 mg | CHEWABLE_TABLET | Freq: Four times a day (QID) | ORAL | 0 refills | Status: DC | PRN

## 2017-12-31 MED ORDER — PANTOPRAZOLE SODIUM 40 MG PO TBEC: 40.0000 mg | DELAYED_RELEASE_TABLET | Freq: Every day | ORAL | 3 refills | Status: DC

## 2017-12-31 NOTE — Progress Notes (Signed)
[facility]  Provider: Blanchie Serve MD   Location:      Place of Service:     PCP: Blanchie Serve, MD Patient Care Team: Blanchie Serve, MD as PCP - General (Internal Medicine) Jarome Matin, MD as Consulting Physician (Dermatology) Melina Modena, [place], Nelda Bucks, NP as Nurse Practitioner [facility])  Extended Emergency Contact Information Primary Emergency Contact: [name],[name] Address: [place], [place] [address] [place] Phone: [phone] Relation: Daughter Secondary Emergency Contact: Filiberto Pinks Address: [address]          [place], [place] [address] [place] Phone: [phone] Relation: Spouse  Goals of Care: Advanced Directive information Advanced Directives 05/27/2017  Does Patient Have a Medical Advance Directive? No  Type of Advance Directive -  Does patient want to make changes to medical advance directive? -  Copy of [place] in Chart? -      Chief Complaint  Patient presents with  . Acute Visit    stomach ache, gasey, bloated, lost appetite  . Medication Refill    No refills needed at this time.     HPI: Patient is a 84 y.o. male seen today for acute visit. This last saturday he felt discomfort to his stomach and then had sharp pain to center of his abdomen for several hours. It was constant, denies any exacerbating or relieving factor. Pain self resolved. He has not been hungry since then. He has been eating some. Denies any nausea or vomiting. He has been passing flatus and is belching. Denies diarrhea. Bowel movement has been regular. Denies any stomach ache today. He feels bloated today. He has not been feeling well since the fall and fracture of his left wrist.     Past Medical History:  Diagnosis Date  . Atrial fibrillation ([place]) 03/26/2011  . Basal cell carcinoma of other specified sites of skin 4/10/012  . Basal  cell carcinoma of skin, site unspecified 03/25/2012  . Benign neoplasm of colon 03/25/2012   adenomatous polyps  . Colon polyp 2014  . Degeneration of lumbar or lumbosacral intervertebral disc 09/24/2011  . Disturbance of skin sensation 01/28/2012  . Diverticulosis 2014  . Dysphagia, unspecified(787.20) 03/26/2011  . Elevated prostate specific antigen (PSA) 03/26/2011  . Exposure to unspecified radiation 03/26/2011  . Family history of colon cancer 07/16/2013  . GERD (gastroesophageal reflux disease) 07/16/2013  . Hearing deficit 12/21/2013  . Hemangioma of unspecified site 03/26/2011  . Hx of adenomatous colonic polyps 07/16/2013  . Hypertrophy of prostate with urinary obstruction and other lower urinary tract symptoms (LUTS) 03/26/2011  . Ingrowing nail 03/26/2011  . Internal hemorrhoids without mention of complication 03/22/55  . Keratosis, actinic 01/28/2012  . Long term (current) use of anticoagulants 03/26/2011  . Long term current use of anticoagulant therapy 07/23/2016  . Lumbago 03/26/2011  . 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 Osteoarthrosis, unspecified whether generalized or localized, unspecified site 11/24/2012  . Other and unspecified hyperlipidemia 03/26/2011  . Other malaise and fatigue 03/26/2011  . Pain in joint, forearm 11/24/2012  . Pain in joint, lower leg 06/15/13   left knee  . Paresthesia of left arm 07/05/2014  . Rectal bleed 06/15/13  . Reflux esophagitis 03/26/2011  . Sacroiliitis, not elsewhere classified ([place]) 05/28/2011  . Scoliosis (and kyphoscoliosis), idiopathic 05/28/2011  . Squamous cell carcinoma  of skin of upper limb, including shoulder 03/26/2011  . Tinnitus 12/21/2013  . 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 Unspecified essential hypertension 03/26/2011   Past Surgical History:  Procedure Laterality Date  . BASAL CELL CARCINOMA EXCISION Left 12/10/14   Dr. Jarome Matin  .  COLONOSCOPY  08/12/2013   Dr. Hilarie Fredrickson adenomatous polyps  . CRYOTHERAPY Left 2009   hand  . PHOTODYNAMIC THERAPY  09/26/14   face and ear lobs  Dr. Jarome Matin  . SQUAMOUS CELL CARCINOMA EXCISION Left 2010   hand    reports that  has never smoked. he has never used smokeless tobacco. He reports that he does not drink alcohol or use drugs. Social History   Socioeconomic History  . Marital status: Married    Spouse name: Not on file  . Number of children: Not on file  . Years of education: Not on file  . Highest education level: Not on file  Social Needs  . Financial resource strain: Not on file  . Food insecurity - worry: Not on file  . Food insecurity - inability: Not on file  . Transportation needs - medical: Not on file  . Transportation needs - non-medical: Not on file  Occupational History  . 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 Smoking status: Never Smoker  . Smokeless tobacco: Never Used  Substance and Sexual Activity  . Alcohol use: No  . Drug use: No  . Sexual activity: Not on file  Other Topics Concern  . Not on file  Social History Narrative   [facility] since 11/[facility]   Married - [place] [employer]   Does not have Living Will   Never smoked   Alcohol none   Exercise: tennis, pool, table tennis 2-3 times a week     Family History  Problem Relation Age of Onset  . Cancer Mother        colon  . Heart disease Father        MI    Health Maintenance  Topic Date Due  . COLONOSCOPY  08/12/2018  . TETANUS/TDAP  09/04/2026  . INFLUENZA VACCINE  Completed  . PNA vac Low Risk Adult  Completed    Allergies  Allergen Reactions  . Ciprofloxacin Other (See Comments)    Patient doesn't like the way it makes him feel  . Zestril [Lisinopril]     unknown  . Sulfa Antibiotics Rash    Outpatient Encounter Medications as of 12/31/2017  Medication Sig  . acetaminophen (TYLENOL) 500 MG tablet Take 500 mg  by mouth as needed.  Marland Kitchen amLODipine (NORVASC) 2.5 MG tablet TAKE 1 TABLET DAILY TO CONTROL BLOOD PRESSURE  . ELIQUIS 5 MG TABS tablet TAKE 1 TABLET TWICE A DAY FOR ANTICOAGULATION  . finasteride (PROSCAR) 5 MG tablet TAKE 1 TABLET ONCE DAILY FOR URINATING DRIBBLING/PROSTATE ENLARGEMENT  . irbesartan (AVAPRO) 300 MG tablet Take 1 tablet (300 mg total) by mouth daily.  . ranitidine (ZANTAC) 150 MG tablet TAKE 1 TABLET TWICE A DAY TO REDUCE STOMACH ACID  . simvastatin (ZOCOR) 10 MG tablet TAKE 1 TABLET DAILY TO LOWER CHOLESTEROL  . terazosin (HYTRIN) 2 MG capsule TAKE 1 CAPSULE DAILY  . TOPROL XL 50 MG 24 hr tablet TAKE 1 TABLET TWICE A DAY TO CONTROL BLOOD PRESSURE AND HEART RHYTHM  . naproxen sodium (ANAPROX) 220 MG tablet Take 220 mg by mouth. Take  one daily after playing tennis  . [DISCONTINUED] acetaminophen (TYLENOL) 500 MG tablet Take 1 tablet (500 mg total) by mouth 3 (three) times daily.   No facility-administered encounter medications on file as of 12/31/2017.     Review of Systems  Constitutional: Negative for chills and fever.  HENT: Negative for mouth sores and trouble swallowing.   Respiratory: Negative for cough.   Cardiovascular: Negative for chest pain and palpitations.  Gastrointestinal: Negative for diarrhea, nausea and vomiting.  Genitourinary: Negative for dysuria, flank pain, frequency and hematuria.       Dark colored urine  Neurological: Negative for dizziness and headaches.  Psychiatric/Behavioral: Negative for confusion.    Vitals:   12/31/17 0850  BP: 132/64  Pulse: 60  Resp: 18  Temp: 97.8 F (36.6 C)  TempSrc: Oral  SpO2: 95%  Weight: 185 lb 9.6 oz (84.2 kg)  Height: 5\' 7"  (1.702 m)   Body mass index is 29.07 kg/m.   Wt Readings from Last 3 Encounters:  12/31/17 185 lb 9.6 oz (84.2 kg)  11/26/17 190 lb 9.6 oz (86.5 kg)  10/01/17 188 lb 12.8 oz (85.6 kg)   Physical Exam  Constitutional: He is oriented to person, place, and time. He appears  well-developed and well-nourished. No distress.  HENT:  Head: Normocephalic and atraumatic.  Mouth/Throat: Oropharynx is clear and moist.  Eyes: Conjunctivae and EOM are normal. Pupils are equal, round, and reactive to light.  Neck: Neck supple.  Pulmonary/Chest: Effort normal and breath sounds normal.  Abdominal: Soft. Bowel sounds are normal. He exhibits no distension and no mass. There is tenderness. There is no rebound and no guarding.  Lymphadenopathy:    He has no cervical adenopathy.  Neurological: He is alert and oriented to person, place, and time.  Skin: Skin is warm and dry. He is not diaphoretic.  Psychiatric: He has a normal mood and affect.    Labs reviewed: Basic Metabolic Panel: Recent Labs    03/24/17 0756 05/16/17 0800 09/22/17 0000  NA 140 145 142  K 4.3 3.8 3.7  CL 105 111* 108  CO2 25 24 26   GLUCOSE 90 95 89  BUN 12 13 15   CREATININE 1.08 1.21* 1.23*  CALCIUM 8.9 9.0 9.2   Liver Function Tests: Recent Labs    03/11/17 0501 03/24/17 0756 05/16/17 0800 09/22/17 0000  AST 21 16 15 17   ALT 11* 12 9 11   ALKPHOS 55 79 70  --   BILITOT 1.0 0.4 0.7 0.5  PROT 5.9* 6.4 6.3 6.4  ALBUMIN 2.7* 3.4* 3.7  --    No results for input(s): LIPASE, AMYLASE in the last 8760 hours. No results for input(s): AMMONIA in the last 8760 hours. CBC: Recent Labs    03/10/17 0850  03/11/17 1443 03/12/17 0440 03/13/17 0444  WBC 18.6*   < > 16.6* 16.9* 11.4*  NEUTROABS 16.9*  --   --   --   --   HGB 13.8   < > 11.6* 11.0* 11.3*  HCT 38.2*   < > 33.4* 30.9* 32.5*  MCV 88.6   < > 91.3 90.6 88.6  PLT 136*   < > 137* 144* 153   < > = values in this interval not displayed.   Cardiac Enzymes: No results for input(s): CKTOTAL, CKMB, CKMBINDEX, TROPONINI in the last 8760 hours. BNP: Invalid input(s): POCBNP No results found for: HGBA1C No results found for: TSH No results found for: VITAMINB12 No results found for: FOLATE No results found  for: IRON, TIBC,  FERRITIN  Lipid Panel: Recent Labs    03/24/17 0756 05/16/17 0800 09/22/17 0000  CHOL 110 132 132  HDL 31* 45 43  LDLCALC 56 61  --   TRIG 114 128 171*  CHOLHDL 3.5 2.9 3.1   No results found for: HGBA1C  Procedures since last visit: Xr Wrist 2 Views Left  Result Date: 12/24/2017 2 views of the left wrist show a healing distal radius fracture with slight dorsal angulation of the fracture itself.  Xr Wrist 2 Views Left  Result Date: 12/03/2017 2 views of the left wrist show a cast in place overlying a distal radius fracture that is extra-articular with no shortening in neutral alignment laterally.   Assessment/Plan  1. Epigastric pain With bloating and discomfort. Has been on aleve but rarely taken it. Concern for acute gastritis. Currently on ranitidine. Start protonix 40 mg daily. Simethicone q6h prn for bloating. Rule out GI bleed and dernaged LFT with his history of anemia, being on H2 blocker and on statin.  - CMP; Future - CBC with Differential/Platelets; Future - pantoprazole (PROTONIX) 40 MG tablet; Take 1 tablet (40 mg total) by mouth daily.  Dispense: 30 tablet; Refill: 3    Labs/tests ordered:   Lab Orders     CMP     CBC with Differential/Platelets   Next appointment: 1 week  Communication: reviewed care plan with patient    Blanchie Serve, MD Internal Medicine [place], [place] [address] Cell Phone (Monday-Friday 8 am - 5 pm): [phone] On Call: [phone] and follow prompts after 5 pm and on weekends Office Phone: [phone] Office Fax: [phone]

## 2018-01-01 ENCOUNTER — Other Ambulatory Visit: Payer: Self-pay

## 2018-01-01 DIAGNOSIS — N3000 Acute cystitis without hematuria: Secondary | ICD-10-CM | POA: Diagnosis not present

## 2018-01-01 DIAGNOSIS — R1013 Epigastric pain: Secondary | ICD-10-CM | POA: Diagnosis not present

## 2018-01-02 ENCOUNTER — Telehealth: Payer: Self-pay

## 2018-01-02 LAB — URINALYSIS, ROUTINE W REFLEX MICROSCOPIC
BILIRUBIN URINE: NEGATIVE
GLUCOSE, UA: NEGATIVE
HGB URINE DIPSTICK: NEGATIVE
Hyaline Cast: NONE SEEN /LPF
KETONES UR: NEGATIVE
NITRITE: NEGATIVE
Protein, ur: NEGATIVE
SQUAMOUS EPITHELIAL / LPF: NONE SEEN /HPF (ref <=5)
Specific Gravity, Urine: 1.015 (ref 1.001–1.03)
pH: <=5 (ref 5.0–8.0)

## 2018-01-02 LAB — COMPREHENSIVE METABOLIC PANEL
AG Ratio: 1.6 (calc) (ref 1.0–2.5)
ALT: 9 U/L (ref 9–46)
AST: 14 U/L (ref 10–35)
Albumin: 3.9 g/dL (ref 3.6–5.1)
Alkaline phosphatase (APISO): 83 U/L (ref 40–115)
BILIRUBIN TOTAL: 1.1 mg/dL (ref 0.2–1.2)
BUN/Creatinine Ratio: 11 (calc) (ref 6–22)
BUN: 13 mg/dL (ref 7–25)
CO2: 27 mmol/L (ref 20–32)
Calcium: 9.8 mg/dL (ref 8.6–10.3)
Chloride: 104 mmol/L (ref 98–110)
Creat: 1.15 mg/dL — ABNORMAL HIGH (ref 0.70–1.11)
Globulin: 2.5 g/dL (calc) (ref 1.9–3.7)
Glucose, Bld: 80 mg/dL (ref 65–99)
Potassium: 3.9 mmol/L (ref 3.5–5.3)
SODIUM: 140 mmol/L (ref 135–146)
TOTAL PROTEIN: 6.4 g/dL (ref 6.1–8.1)

## 2018-01-02 LAB — CBC WITH DIFFERENTIAL/PLATELET
BASOS ABS: 20 {cells}/uL (ref 0–200)
Basophils Relative: 0.3 %
EOS PCT: 3.7 %
Eosinophils Absolute: 241 cells/uL (ref 15–500)
HEMATOCRIT: 38.8 % (ref 38.5–50.0)
Hemoglobin: 13.5 g/dL (ref 13.2–17.1)
LYMPHS ABS: 1846 {cells}/uL (ref 850–3900)
MCH: 32 pg (ref 27.0–33.0)
MCHC: 34.8 g/dL (ref 32.0–36.0)
MCV: 91.9 fL (ref 80.0–100.0)
MONOS PCT: 8.5 %
MPV: 9.4 fL (ref 7.5–12.5)
Neutro Abs: 3842 cells/uL (ref 1500–7800)
Neutrophils Relative %: 59.1 %
Platelets: 185 10*3/uL (ref 140–400)
RBC: 4.22 10*6/uL (ref 4.20–5.80)
RDW: 12.9 % (ref 11.0–15.0)
Total Lymphocyte: 28.4 %
WBC mixed population: 553 cells/uL (ref 200–950)
WBC: 6.5 10*3/uL (ref 3.8–10.8)

## 2018-01-02 NOTE — Telephone Encounter (Signed)
Continuing from the previous telephone put in. Per Dr. Bubba Camp I called the patient to see how he was feeling. Patient stated that he wasn't having any burning or pain with urination at present. Patient also had a question about 2 of the medications that he is currently taking. Protonix and Zantac. Patient was concerned on whether or not he should take both medications at the same time. I was advised by Dr. Bubba Camp to tell the patient that he should take both medication at present. Patient verbalized understanding the instructions provided.

## 2018-01-02 NOTE — Telephone Encounter (Signed)
Patient was seen in clinic Wednesday 12/31/17 for acute concerns. Dr. Bubba Camp received his lab results along with UA results. Per Dr. Bubba Camp I called the patient se

## 2018-01-04 LAB — URINE CULTURE
MICRO NUMBER:: 90071968
SPECIMEN QUALITY:: ADEQUATE

## 2018-01-07 ENCOUNTER — Encounter: Payer: Self-pay | Admitting: Internal Medicine

## 2018-01-07 ENCOUNTER — Non-Acute Institutional Stay: Payer: Medicare Other | Admitting: Internal Medicine

## 2018-01-07 VITALS — BP 128/68 | HR 60 | Temp 97.5°F | Resp 16 | Ht 67.0 in | Wt 184.6 lb

## 2018-01-07 DIAGNOSIS — A498 Other bacterial infections of unspecified site: Secondary | ICD-10-CM

## 2018-01-07 DIAGNOSIS — K219 Gastro-esophageal reflux disease without esophagitis: Secondary | ICD-10-CM

## 2018-01-07 DIAGNOSIS — N3000 Acute cystitis without hematuria: Secondary | ICD-10-CM | POA: Diagnosis not present

## 2018-01-07 MED ORDER — AMOXICILLIN-POT CLAVULANATE 875-125 MG PO TABS: 1.0000 | ORAL_TABLET | Freq: Two times a day (BID) | ORAL | 0 refills | Status: AC

## 2018-01-07 MED ORDER — PROBIOTIC 250 MG PO CAPS: 1.0000 | ORAL_CAPSULE | Freq: Two times a day (BID) | ORAL | 0 refills | Status: DC

## 2018-01-07 NOTE — Patient Instructions (Addendum)
  Continue taking pantoprazole 40 mg daily x 2 more weeks and stop it. Continue taking your ranitidine.   Start taking antibiotic prescribed every 12 hours for [redacted] week along with your probiotic to treat urine infection.  Drink plenty of fluids- water preferred.          Urinary Tract Infection, Adult A urinary tract infection (UTI) is an infection of any part of the urinary tract. The urinary tract includes the:  Kidneys.  Ureters.  Bladder.  Urethra.  These organs make, store, and get rid of pee (urine) in the body. Follow these instructions at home:  Take over-the-counter and prescription medicines only as told by your doctor.  If you were prescribed an antibiotic medicine, take it as told by your doctor. Do not stop taking the antibiotic even if you start to feel better.  Avoid the following drinks: ? Alcohol. ? Caffeine. ? Tea. ? Carbonated drinks.  Drink enough fluid to keep your pee clear or pale yellow.  Keep all follow-up visits as told by your doctor. This is important.  Make sure to: ? Empty your bladder often and completely. Do not to hold pee for long periods of time. ? Empty your bladder before and after sex. ? Wipe from front to back after a bowel movement if you are male. Use each tissue one time when you wipe. Contact a doctor if:  You have back pain.  You have a fever.  You feel sick to your stomach (nauseous).  You throw up (vomit).  Your symptoms do not get better after 3 days.  Your symptoms go away and then come back. Get help right away if:  You have very bad back pain.  You have very bad lower belly (abdominal) pain.  You are throwing up and cannot keep down any medicines or water. This information is not intended to replace advice given to you by your health care provider. Make sure you discuss any questions you have with your health care provider. Document Released: 05/20/2008 Document Revised: 05/09/2016 Document Reviewed:  10/23/2015 Elsevier Interactive Patient Education  [employer].

## 2018-01-07 NOTE — Progress Notes (Signed)
[facility]  Provider: Blanchie Serve MD   Location:  [place] [facility]:  Clinic (12)  PCP: Blanchie Serve, MD Patient Care Team: Blanchie Serve, MD as PCP - General (Internal Medicine) Jarome Matin, MD as Consulting Physician (Dermatology) [place], [place], Nelda Bucks, NP as Nurse Practitioner [facility])  Extended Emergency Contact Information Primary Emergency Contact: [facility] Address: [place], [place] [address] Johnnette Litter of [place] Phone: [phone] Relation: Daughter Secondary Emergency Contact: Filiberto Pinks Address: [address]          [place], [place] [address] [place] Phone: [phone] Relation: Spouse  Goals of Care: Advanced Directive information Advanced Directives 05/27/2017  Does Patient Have a Medical Advance Directive? No  Type of Advance Directive -  Does patient want to make changes to medical advance directive? -  Copy of [place] in Chart? -      Chief Complaint  Patient presents with  . Acute Visit    follow up on stomach pain  . Medication Refill    No refills needed at this time.   Marland Kitchen Results    Discuss labs    HPI: Patient is a 84 y.o. male seen today for acute visit. He was seen last week for stomach pain. He was placed on PPI with concern for gastritis contributing to pain with him on prn aleve. He is currently on ranitidine and pantoprazole.  He also had a u/a with c/s sent. This has resulted positive. Patient mentions that his bloating and distension with uneasiness in abdominal area has improved. He has some nausea. He complaints of dysuria - burning and pain with urination on and off for few days. Denies fever.   Past Medical History:  Diagnosis Date  . Atrial fibrillation ([place]) 03/26/2011  . Basal cell carcinoma of other specified sites of skin 4/10/012  . Basal cell carcinoma of skin, site  unspecified 03/25/2012  . Benign neoplasm of colon 03/25/2012   adenomatous polyps  . Colon polyp 2014  . Degeneration of lumbar or lumbosacral intervertebral disc 09/24/2011  . Disturbance of skin sensation 01/28/2012  . Diverticulosis 2014  . Dysphagia, unspecified(787.20) 03/26/2011  . Elevated prostate specific antigen (PSA) 03/26/2011  . Exposure to unspecified radiation 03/26/2011  . Family history of colon cancer 07/16/2013  . GERD (gastroesophageal reflux disease) 07/16/2013  . Hearing deficit 12/21/2013  . Hemangioma of unspecified site 03/26/2011  . Hx of adenomatous colonic polyps 07/16/2013  . Hypertrophy of prostate with urinary obstruction and other lower urinary tract symptoms (LUTS) 03/26/2011  . Ingrowing nail 03/26/2011  . Internal hemorrhoids without mention of complication 04/22/83  . Keratosis, actinic 01/28/2012  . Long term (current) use of anticoagulants 03/26/2011  . Long term current use of anticoagulant therapy 07/23/2016  . Lumbago 03/26/2011  . 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 Osteoarthrosis, unspecified whether generalized or localized, unspecified site 11/24/2012  . Other and unspecified hyperlipidemia 03/26/2011  . Other malaise and fatigue 03/26/2011  . Pain in joint, forearm 11/24/2012  . Pain in joint, lower leg 06/15/13   left knee  . Paresthesia of left arm 07/05/2014  . Rectal bleed 06/15/13  . Reflux esophagitis 03/26/2011  . Sacroiliitis, not elsewhere classified ([place]) 05/28/2011  . Scoliosis (and kyphoscoliosis), idiopathic 05/28/2011  . Squamous cell carcinoma of skin of  upper limb, including shoulder 03/26/2011  . Tinnitus 12/21/2013  . 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 Unspecified essential hypertension 03/26/2011   Past Surgical History:  Procedure Laterality Date  . BASAL CELL CARCINOMA EXCISION Left 12/10/14   Dr. Jarome Matin  . COLONOSCOPY  08/12/2013   Dr. Hilarie Fredrickson  adenomatous polyps  . CRYOTHERAPY Left 2009   hand  . PHOTODYNAMIC THERAPY  09/26/14   face and ear lobs  Dr. Jarome Matin  . SQUAMOUS CELL CARCINOMA EXCISION Left 2010   hand    reports that  has never smoked. he has never used smokeless tobacco. He reports that he does not drink alcohol or use drugs. Social History   Socioeconomic History  . Marital status: Married    Spouse name: Not on file  . Number of children: Not on file  . Years of education: Not on file  . Highest education level: Not on file  Social Needs  . Financial resource strain: Not on file  . Food insecurity - worry: Not on file  . Food insecurity - inability: Not on file  . Transportation needs - medical: Not on file  . Transportation needs - non-medical: Not on file  Occupational History  . 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 Smoking status: Never Smoker  . Smokeless tobacco: Never Used  Substance and Sexual Activity  . Alcohol use: No  . Drug use: No  . Sexual activity: Not on file  Other Topics Concern  . Not on file  Social History Narrative   [facility] since 11/[facility]   Married - [place] [employer]   Does not have Living Will   Never smoked   Alcohol none   Exercise: tennis, pool, table tennis 2-3 times a week     Family History  Problem Relation Age of Onset  . Cancer Mother        colon  . Heart disease Father        MI    Health Maintenance  Topic Date Due  . COLONOSCOPY  08/12/2018  . TETANUS/TDAP  09/04/2026  . INFLUENZA VACCINE  Completed  . PNA vac Low Risk Adult  Completed    Allergies  Allergen Reactions  . Ciprofloxacin Other (See Comments)    Patient doesn't like the way it makes him feel  . Zestril [Lisinopril]     unknown  . Sulfa Antibiotics Rash    Outpatient Encounter Medications as of 01/07/2018  Medication Sig  . acetaminophen (TYLENOL) 500 MG tablet Take 500 mg by mouth as needed.  Marland Kitchen amLODipine  (NORVASC) 2.5 MG tablet TAKE 1 TABLET DAILY TO CONTROL BLOOD PRESSURE  . ELIQUIS 5 MG TABS tablet TAKE 1 TABLET TWICE A DAY FOR ANTICOAGULATION  . finasteride (PROSCAR) 5 MG tablet TAKE 1 TABLET ONCE DAILY FOR URINATING DRIBBLING/PROSTATE ENLARGEMENT  . irbesartan (AVAPRO) 300 MG tablet Take 1 tablet (300 mg total) by mouth daily.  . pantoprazole (PROTONIX) 40 MG tablet Take 1 tablet (40 mg total) by mouth daily.  . ranitidine (ZANTAC) 150 MG tablet TAKE 1 TABLET TWICE A DAY TO REDUCE STOMACH ACID  . simethicone (MYLICON) 80 MG chewable tablet Chew 80 mg by mouth as needed for flatulence.  . simvastatin (ZOCOR) 10 MG tablet TAKE 1 TABLET DAILY TO LOWER CHOLESTEROL  . terazosin (HYTRIN) 2 MG capsule TAKE 1 CAPSULE DAILY  . TOPROL XL 50 MG  24 hr tablet TAKE 1 TABLET TWICE A DAY TO CONTROL BLOOD PRESSURE AND HEART RHYTHM  . amoxicillin-clavulanate (AUGMENTIN) 875-125 MG tablet Take 1 tablet by mouth 2 (two) times daily for 7 days.  . Saccharomyces boulardii (PROBIOTIC) 250 MG CAPS Take 1 capsule by mouth 2 (two) times daily.  . [DISCONTINUED] simethicone (GAS-X) 80 MG chewable tablet Chew 1 tablet (80 mg total) by mouth every 6 (six) hours as needed for flatulence.   No facility-administered encounter medications on file as of 01/07/2018.     Review of Systems  Constitutional: Negative for appetite change, chills and fever.  HENT: Positive for hearing loss. Negative for congestion.   Genitourinary: Positive for difficulty urinating, dysuria and urgency. Negative for flank pain, frequency and hematuria.  Musculoskeletal: Negative for back pain.  Neurological: Negative for dizziness and headaches.  Psychiatric/Behavioral: Negative for confusion.    Vitals:   01/07/18 0853  BP: 128/68  Pulse: 60  Resp: 16  Temp: (!) 97.5 F (36.4 C)  TempSrc: Oral  SpO2: 97%  Weight: 184 lb 9.6 oz (83.7 kg)  Height: 5\' 7"  (1.702 m)   Body mass index is 28.91 kg/m. Physical Exam  Constitutional: He  is oriented to person, place, and time. He appears well-developed and well-nourished. No distress.  HENT:  Head: Normocephalic and atraumatic.  Mouth/Throat: Oropharynx is clear and moist.  Abdominal: Soft. Bowel sounds are normal. He exhibits no distension. There is no tenderness. There is no guarding.  Neurological: He is alert and oriented to person, place, and time.  Skin: Skin is warm and dry. He is not diaphoretic.  Psychiatric: He has a normal mood and affect.    Labs reviewed: Basic Metabolic Panel: Recent Labs    05/16/17 0800 09/22/17 0000 01/01/18 0000  NA 145 142 140  K 3.8 3.7 3.9  CL 111* 108 104  CO2 24 26 27   GLUCOSE 95 89 80  BUN 13 15 13   CREATININE 1.21* 1.23* 1.15*  CALCIUM 9.0 9.2 9.8   Liver Function Tests: Recent Labs    03/11/17 0501 03/24/17 0756 05/16/17 0800 09/22/17 0000 01/01/18 0000  AST 21 16 15 17 14   ALT 11* 12 9 11 9   ALKPHOS 55 79 70  --   --   BILITOT 1.0 0.4 0.7 0.5 1.1  PROT 5.9* 6.4 6.3 6.4 6.4  ALBUMIN 2.7* 3.4* 3.7  --   --    No results for input(s): LIPASE, AMYLASE in the last 8760 hours. No results for input(s): AMMONIA in the last 8760 hours. CBC: Recent Labs    03/10/17 0850  03/12/17 0440 03/13/17 0444 01/01/18 0000  WBC 18.6*   < > 16.9* 11.4* 6.5  NEUTROABS 16.9*  --   --   --  3,842  HGB 13.8   < > 11.0* 11.3* 13.5  HCT 38.2*   < > 30.9* 32.5* 38.8  MCV 88.6   < > 90.6 88.6 91.9  PLT 136*   < > 144* 153 185   < > = values in this interval not displayed.   Cardiac Enzymes: No results for input(s): CKTOTAL, CKMB, CKMBINDEX, TROPONINI in the last 8760 hours. BNP: Invalid input(s): POCBNP No results found for: HGBA1C No results found for: TSH No results found for: VITAMINB12 No results found for: FOLATE No results found for: IRON, TIBC, FERRITIN  Lipid Panel: Recent Labs    03/24/17 0756 05/16/17 0800 09/22/17 0000  CHOL 110 132 132  HDL 31* 45 43  LDLCALC  56 61  --   TRIG 114 128 171*  CHOLHDL  3.5 2.9 3.1   No results found for: HGBA1C  Procedures since last visit: Xr Wrist 2 Views Left  Result Date: 12/24/2017 2 views of the left wrist show a healing distal radius fracture with slight dorsal angulation of the fracture itself.   Assessment/Plan  1. Citrobacter infection Has urine culture growing citrobacter. Positive for symptoms suggestive of urine infection. See below.   2. Acute cystitis without hematuria Afebrile. Has dysuria and urgency with some nausea. Positive u/a and culture. Reviewed sensitivity. Start amoxicillin-clavulanic acid 875 mg q12h x 1 week with probiotic 250 mg bid x 1 week. Encouraged hydration. Has had history of UTI with sepsis in past.   3. Gastroesophageal reflux disease, esophagitis presence not specified With him being placed on antibiotic, continue PPI x 2 more weeks and stop it. Continue h2 blocker.    Labs/tests ordered:  None. Today's result reviewed with patient.   Next appointment: has upcoming f/u   Communication: reviewed care plan with patient     Blanchie Serve, MD Internal Medicine [place], [place] [address] Cell Phone (Monday-Friday 8 am - 5 pm): [phone] On Call: [phone] and follow prompts after 5 pm and on weekends Office Phone: [phone] Office Fax: [phone]

## 2018-01-21 ENCOUNTER — Ambulatory Visit (INDEPENDENT_AMBULATORY_CARE_PROVIDER_SITE_OTHER): Payer: Medicare Other

## 2018-01-21 ENCOUNTER — Encounter (INDEPENDENT_AMBULATORY_CARE_PROVIDER_SITE_OTHER): Payer: Self-pay | Admitting: Orthopaedic Surgery

## 2018-01-21 ENCOUNTER — Encounter: Payer: Self-pay | Admitting: Internal Medicine

## 2018-01-21 ENCOUNTER — Ambulatory Visit (INDEPENDENT_AMBULATORY_CARE_PROVIDER_SITE_OTHER): Payer: Medicare Other | Admitting: Orthopaedic Surgery

## 2018-01-21 DIAGNOSIS — S52532D Colles' fracture of left radius, subsequent encounter for closed fracture with routine healing: Secondary | ICD-10-CM

## 2018-01-21 NOTE — Progress Notes (Signed)
                                                      The patient is a very active right-hand-dominant 84 year old who is now 7 weeks status post a mechanical fall when he sustained a left nondominant distal radius fracture and a tip of the ulnar styloid fracture.  He was first in a short arm cast followed by now removable wrist splint.  He says he only wears a wrist splint when he is out of his apartment.  He says he is doing well overall and does have some aches and pains but he feels feels like it is getting better.  It hurts mainly when he is grabbing something.  On exam he is got improved palmar flexion and dorsiflexion of the left wrist but pain at extremes of motion.  He has some ulnar-sided wrist pain as well.  There is no gross deformities.  Is neurovascular intact.  X-rays show interval healing of the fracture.  I do believe the radius is healed and there is some slight volar angulation of the fracture but clinically is compensated for it well at his age.  The tip of the ulnar styloid has some chronic changes to it.  At this point we will see him back for final visit in 4 weeks but no x-rays are needed.  All questions concerns were answered and addressed.

## 2018-01-28 ENCOUNTER — Encounter: Payer: Self-pay | Admitting: Internal Medicine

## 2018-01-28 ENCOUNTER — Non-Acute Institutional Stay: Payer: Medicare Other | Admitting: Internal Medicine

## 2018-01-28 VITALS — BP 130/72 | HR 60 | Temp 97.7°F | Resp 16 | Ht 67.0 in | Wt 185.6 lb

## 2018-01-28 DIAGNOSIS — I482 Chronic atrial fibrillation, unspecified: Secondary | ICD-10-CM

## 2018-01-28 DIAGNOSIS — N401 Enlarged prostate with lower urinary tract symptoms: Secondary | ICD-10-CM | POA: Diagnosis not present

## 2018-01-28 DIAGNOSIS — S52532S Colles' fracture of left radius, sequela: Secondary | ICD-10-CM

## 2018-01-28 DIAGNOSIS — H9193 Unspecified hearing loss, bilateral: Secondary | ICD-10-CM | POA: Diagnosis not present

## 2018-01-28 DIAGNOSIS — Z23 Encounter for immunization: Secondary | ICD-10-CM

## 2018-01-28 DIAGNOSIS — I10 Essential (primary) hypertension: Secondary | ICD-10-CM

## 2018-01-28 DIAGNOSIS — Z7189 Other specified counseling: Secondary | ICD-10-CM | POA: Diagnosis not present

## 2018-01-28 DIAGNOSIS — K219 Gastro-esophageal reflux disease without esophagitis: Secondary | ICD-10-CM | POA: Diagnosis not present

## 2018-01-28 DIAGNOSIS — R350 Frequency of micturition: Secondary | ICD-10-CM | POA: Diagnosis not present

## 2018-01-28 MED ORDER — ZOSTER VAC RECOMB ADJUVANTED 50 MCG/0.5ML IM SUSR: 0.5000 mL | Freq: Once | INTRAMUSCULAR | 0 refills | Status: AC

## 2018-01-28 MED ORDER — PNEUMOCOCCAL 13-VAL CONJ VACC IM SUSP: 0.5000 mL | INTRAMUSCULAR | 0 refills | Status: AC

## 2018-01-28 NOTE — Progress Notes (Signed)
[facility]  Provider: Blanchie Serve MD   Location:  [facility] of Service:  Clinic (12)  PCP: Blanchie Serve, MD Patient Care Team: Blanchie Serve, MD as PCP - General (Internal Medicine) Jarome Matin, MD as Consulting Physician (Dermatology) Pione[place], Overly, Nelda Bucks, NP as Nurse Practitioner [facility] Medicine)  Extended Emergency Contact Information Primary Emergency Contact: [facility] Address: [place], [place] [address] [address] Phone: [phone] Relation: Daughter Secondary Emergency Contact: Filiberto Pinks Address: [address]          [place], [place] [address] [place] Phone: [phone] Relation: Spouse  Code Status: full code  Goals of Care: Advanced Directive information Advanced Directives 01/28/2018  Does Patient Have a Medical Advance Directive? No  Type of Advance Directive -  Does patient want to make changes to medical advance directive? -  Copy of [place] in Chart? -  Would patient like information on creating a medical advance directive? Yes (MAU/Ambulatory/Procedural Areas - Information given)      Chief Complaint  Patient presents with  . Medical Management of Chronic Issues    4 month follow up  . Medication Refill    No refills needed at this time  . MMSE    29/30. Passed clock drawing    HPI: Patient is a 84 y.o. male seen today for routine visit.   gerd- improved. Currently on ranitidine 150 mg bid. Stopped pantoprazole and gas-x  HTN- currently on toprol xl 50 mg daily, avapro 300 mg daily and amlodipine 2.5 mg daily, denies headache or dizziness, denies palpitation or chest pain  afib- controlled, currently on eliquis and toprol xl  Hyperlipidemia- currently on simvastatin  BPH- currently on finasteride and terazosin. Has some urgency at times, has freuqnency at times as well. Denies  dysuria or hematuria. Stream is good. Denies hesitancy.   Family history of colon cancer- colonoscopy 07/2013 3 polyps were removed and repeat in 5 years was recommended.  Past Medical History:  Diagnosis Date  . Atrial fibrillation ([place]) 03/26/2011  . Basal cell carcinoma of other specified sites of skin 4/10/012  . Basal cell carcinoma of skin, site unspecified 03/25/2012  . Benign neoplasm of colon 03/25/2012   adenomatous polyps  . Colon polyp 2014  . Degeneration of lumbar or lumbosacral intervertebral disc 09/24/2011  . Disturbance of skin sensation 01/28/2012  . Diverticulosis 2014  . Dysphagia, unspecified(787.20) 03/26/2011  . Elevated prostate specific antigen (PSA) 03/26/2011  . Exposure to unspecified radiation 03/26/2011  . Family history of colon cancer 07/16/2013  . GERD (gastroesophageal reflux disease) 07/16/2013  . Hearing deficit 12/21/2013  . Hemangioma of unspecified site 03/26/2011  . Hx of adenomatous colonic polyps 07/16/2013  . Hypertrophy of prostate with urinary obstruction and other lower urinary tract symptoms (LUTS) 03/26/2011  . Ingrowing nail 03/26/2011  . Internal hemorrhoids without mention of complication 02/13/42  . Keratosis, actinic 01/28/2012  . Long term (current) use of anticoagulants 03/26/2011  . Long term current use of anticoagulant therapy 07/23/2016  . Lumbago 03/26/2011  . 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 Osteoarthrosis, unspecified whether generalized or localized, unspecified site 11/24/2012  . Other and unspecified hyperlipidemia 03/26/2011  . Other malaise and fatigue 03/26/2011  . Pain in joint, forearm 11/24/2012  . Pain in joint, lower leg 06/15/13  left knee  . Paresthesia of left arm 07/05/2014  . Rectal bleed 06/15/13  . Reflux esophagitis 03/26/2011  . Sacroiliitis, not elsewhere classified ([place]) 05/28/2011  . Scoliosis (and kyphoscoliosis), idiopathic 05/28/2011  . 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 Tinnitus 12/21/2013  . 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 Unspecified essential hypertension 03/26/2011   Past Surgical History:  Procedure Laterality Date  . BASAL CELL CARCINOMA EXCISION Left 12/10/14   Dr. Jarome Matin  . COLONOSCOPY  08/12/2013   Dr. Hilarie Fredrickson adenomatous polyps  . CRYOTHERAPY Left 2009   hand  . PHOTODYNAMIC THERAPY  09/26/14   face and ear lobs  Dr. Jarome Matin  . SQUAMOUS CELL CARCINOMA EXCISION Left 2010   hand    reports that  has never smoked. he has never used smokeless tobacco. He reports that he does not drink alcohol or use drugs. Social History   Socioeconomic History  . Marital status: Married    Spouse name: Not on file  . Number of children: Not on file  . Years of education: Not on file  . Highest education level: Not on file  Social Needs  . Financial resource strain: Not on file  . Food insecurity - worry: Not on file  . Food insecurity - inability: Not on file  . Transportation needs - medical: Not on file  . Transportation needs - non-medical: Not on file  Occupational History  . 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 Smoking status: Never Smoker  . Smokeless tobacco: Never Used  Substance and Sexual Activity  . Alcohol use: No  . Drug use: No  . Sexual activity: Not on file  Other Topics Concern  . Not on file  Social History Narrative   [facility] since 11/[facility]   Married - [employer]   Does not have Living Will   Never smoked   Alcohol none   Exercise: tennis, pool, table tennis 2-3 times a week    Functional Status Survey:    Family History  Problem Relation Age of Onset  . Cancer Mother        colon  . Heart disease Father        MI    Health Maintenance  Topic Date Due  . COLONOSCOPY  08/12/2018  . TETANUS/TDAP  09/04/2026  . INFLUENZA VACCINE   Completed  . PNA vac Low Risk Adult  Completed    Allergies  Allergen Reactions  . Ciprofloxacin Other (See Comments)    Patient doesn't like the way it makes him feel  . Zestril [Lisinopril]     unknown  . Sulfa Antibiotics Rash    Outpatient Encounter Medications as of 01/28/2018  Medication Sig  . ELIQUIS 5 MG TABS tablet TAKE 1 TABLET TWICE A DAY FOR ANTICOAGULATION  . finasteride (PROSCAR) 5 MG tablet TAKE 1 TABLET ONCE DAILY FOR URINATING DRIBBLING/PROSTATE ENLARGEMENT  . irbesartan (AVAPRO) 300 MG tablet Take 1 tablet (300 mg total) by mouth daily.  . ranitidine (ZANTAC) 150 MG tablet TAKE 1 TABLET TWICE A DAY TO REDUCE STOMACH ACID  . simvastatin (ZOCOR) 10 MG tablet TAKE 1 TABLET DAILY TO LOWER CHOLESTEROL  . terazosin (HYTRIN) 2 MG capsule TAKE 1 CAPSULE DAILY  . TOPROL XL 50 MG 24 hr tablet TAKE 1 TABLET TWICE A DAY TO CONTROL BLOOD PRESSURE AND HEART  RHYTHM  . [DISCONTINUED] amLODipine (NORVASC) 2.5 MG tablet TAKE 1 TABLET DAILY TO CONTROL BLOOD PRESSURE  . acetaminophen (TYLENOL) 500 MG tablet Take 500 mg by mouth as needed.  . pneumococcal 13-valent conjugate vaccine (PREVNAR 13) SUSP injection Inject 0.5 mLs into the muscle tomorrow at 10 am for 1 dose.  Marland Kitchen Zoster Vaccine Adjuvanted [facility]) injection Inject 0.5 mLs into the muscle once for 1 dose.  . [DISCONTINUED] pantoprazole (PROTONIX) 40 MG tablet Take 1 tablet (40 mg total) by mouth daily. (Patient not taking: Reported on 01/28/2018)  . [DISCONTINUED] Saccharomyces boulardii (PROBIOTIC) 250 MG CAPS Take 1 capsule by mouth 2 (two) times daily. (Patient not taking: Reported on 01/28/2018)  . [DISCONTINUED] simethicone (MYLICON) 80 MG chewable tablet Chew 80 mg by mouth as needed for flatulence.   No facility-administered encounter medications on file as of 01/28/2018.     Review of Systems  Constitutional: Negative for appetite change, chills, fatigue and fever.  HENT: Negative for congestion, rhinorrhea and trouble  swallowing.   Eyes: Positive for visual disturbance.       Cataract surgery in sept and oct 2019  Respiratory: Negative for cough and shortness of breath.   Cardiovascular: Negative for chest pain and palpitations.  Gastrointestinal: Negative for abdominal pain, blood in stool, constipation, diarrhea, nausea and vomiting.  Genitourinary: Positive for frequency. Negative for dysuria.  Musculoskeletal: Negative for back pain and gait problem.  Skin: Negative for rash.  Neurological: Positive for dizziness. Negative for numbness and headaches.  Hematological: Bruises/bleeds easily.  Psychiatric/Behavioral: Negative for behavioral problems and decreased concentration. The patient is not nervous/anxious.     Vitals:   01/28/18 1141  BP: 130/72  Pulse: 60  Resp: 16  Temp: 97.7 F (36.5 C)  TempSrc: Oral  SpO2: 97%  Weight: 185 lb 9.6 oz (84.2 kg)  Height: 5' 7"  (1.702 m)   Body mass index is 29.07 kg/m.   Wt Readings from Last 3 Encounters:  01/28/18 185 lb 9.6 oz (84.2 kg)  01/07/18 184 lb 9.6 oz (83.7 kg)  12/31/17 185 lb 9.6 oz (84.2 kg)   Physical Exam  Constitutional: He is oriented to person, place, and time. No distress.  overweight  HENT:  Head: Normocephalic and atraumatic.  Right Ear: External ear normal.  Left Ear: External ear normal.  Nose: Nose normal.  Mouth/Throat: Oropharynx is clear and moist. No oropharyngeal exudate.  Eyes: Conjunctivae and EOM are normal. Pupils are equal, round, and reactive to light. Right eye exhibits no discharge. Left eye exhibits no discharge.  Neck: Normal range of motion. Neck supple.  Cardiovascular: Normal rate.  Pulmonary/Chest: Effort normal and breath sounds normal. He has no wheezes. He has no rales.  Abdominal: Soft. Bowel sounds are normal. There is no tenderness.  Musculoskeletal: He exhibits deformity. He exhibits no edema.  Good ROM in all 4 extremities  Lymphadenopathy:    He has no cervical adenopathy.    Neurological: He is alert and oriented to person, place, and time.  01/28/18 MMSE 29/30, passed clock draw  Skin: Skin is warm and dry. No rash noted. He is not diaphoretic.  Psychiatric: He has a normal mood and affect. His behavior is normal.    Labs reviewed: Basic Metabolic Panel: Recent Labs    05/16/17 0800 09/22/17 0000 01/01/18 0000  NA 145 142 140  K 3.8 3.7 3.9  CL 111* 108 104  CO2 24 26 27   GLUCOSE 95 89 80  BUN 13 15 13   CREATININE  1.21* 1.23* 1.15*  CALCIUM 9.0 9.2 9.8   Liver Function Tests: Recent Labs    03/11/17 0501 03/24/17 0756 05/16/17 0800 09/22/17 0000 01/01/18 0000  AST 21 16 15 17 14   ALT 11* 12 9 11 9   ALKPHOS 55 79 70  --   --   BILITOT 1.0 0.4 0.7 0.5 1.1  PROT 5.9* 6.4 6.3 6.4 6.4  ALBUMIN 2.7* 3.4* 3.7  --   --    No results for input(s): LIPASE, AMYLASE in the last 8760 hours. No results for input(s): AMMONIA in the last 8760 hours. CBC: Recent Labs    03/10/17 0850  03/12/17 0440 03/13/17 0444 01/01/18 0000  WBC 18.6*   < > 16.9* 11.4* 6.5  NEUTROABS 16.9*  --   --   --  3,842  HGB 13.8   < > 11.0* 11.3* 13.5  HCT 38.2*   < > 30.9* 32.5* 38.8  MCV 88.6   < > 90.6 88.6 91.9  PLT 136*   < > 144* 153 185   < > = values in this interval not displayed.   Cardiac Enzymes: No results for input(s): CKTOTAL, CKMB, CKMBINDEX, TROPONINI in the last 8760 hours. BNP: Invalid input(s): POCBNP No results found for: HGBA1C No results found for: TSH No results found for: VITAMINB12 No results found for: FOLATE No results found for: IRON, TIBC, FERRITIN  Lipid Panel: Recent Labs    03/24/17 0756 05/16/17 0800 09/22/17 0000  CHOL 110 132 132  HDL 31* 45 43  LDLCALC 56 61  --   TRIG 114 128 171*  CHOLHDL 3.5 2.9 3.1   No results found for: HGBA1C  Procedures since last visit: Xr Wrist 2 Views Left  Result Date: 01/21/2018 2 views of the left wrist show a healing to almost healed distal radius fracture.  There is a tip of  the ulnar styloid fracture and slight volar angulation of the fracture.   Assessment/Plan  1. Gastroesophageal reflux disease without esophagitis Continue ranitidine for now.  - CMP with eGFR([facility]); Future  2. Chronic atrial fibrillation (HCC) Continue metoprolol succinate and apixaban. C/w statin. Lab reviewed - Lipid Panel; Future - CBC with Differential/Platelets; Future  3. Essential hypertension Controlled BP. With few episodes of dizziness and controlled BP, d/c amlodipine for now. Monitor BP. Warning signs with elevated BP provided.  - Lipid Panel; Future  4. Hearing problem of both ears Tried and did not like hearing aid. Supportive care  5. Benign prostatic hyperplasia with urinary frequency C/w finasteride and terazosin - CBC with Differential/Platelets; Future  6. Need for shingles vaccine - Zoster Vaccine Adjuvanted [facility]) injection; Inject 0.5 mLs into the muscle once for 1 dose.  Dispense: 0.5 mL; Refill: 0  7. Need for pneumococcal vaccine - pneumococcal 13-valent conjugate vaccine (PREVNAR 13) SUSP injection; Inject 0.5 mLs into the muscle tomorrow at 10 am for 1 dose.  Dispense: 0.5 mL; Refill: 0  8. Closed Colles' fracture of left radius, sequela Followed by orthopedic, healing well, denies pain  9. Goals of care discussion Reviewed goals of care with patient. He does not have advance directives. discussed about importance of having living will and an appointed health care power of attorney. He agrees that he needs one and will start working on it. In absence of pulse or breathing, he wants to be a full code for now but he thinks he will decide on DNR in future. Went over MOST form with him. He mentions not being clear  about what he wants at present. He would like to review need for hospitalization and iv fluids with his wife. He mentions that his last hospitalization with sepsis was a bad experience as it had wiped his energy out and would like to avoid  hospitalization if possible. Reading material provided. Spent time from 12:00 pm- 12:17 pm reviewing goals of care.  Labs/tests ordered:   Lab Orders     CMP with eGFR([facility])     Lipid Panel     CBC with Differential/Platelets  Next appointment: 4 months  Communication: reviewed care plan with patient    Blanchie Serve, MD Internal Medicine [place], [place] [address] Cell Phone (Monday-Friday 8 am - 5 pm): [phone] On Call: [phone] and follow prompts after 5 pm and on weekends Office Phone: [phone] Office Fax: [phone]

## 2018-01-28 NOTE — Patient Instructions (Addendum)
  Stop taking your amlodipine from tomorrow.   Check blood pressure twice a week for now for 2-3 weeks. If upper number is consistently higher than 140, let us know.  Look into the pink MOST form that has been provided. I would recommend having a living will and HCPOA paperwork as well.

## 2018-01-29 DIAGNOSIS — L820 Inflamed seborrheic keratosis: Secondary | ICD-10-CM | POA: Diagnosis not present

## 2018-01-29 DIAGNOSIS — L821 Other seborrheic keratosis: Secondary | ICD-10-CM | POA: Diagnosis not present

## 2018-01-29 DIAGNOSIS — D1801 Hemangioma of skin and subcutaneous tissue: Secondary | ICD-10-CM | POA: Diagnosis not present

## 2018-01-29 DIAGNOSIS — L570 Actinic keratosis: Secondary | ICD-10-CM | POA: Diagnosis not present

## 2018-01-29 DIAGNOSIS — Z85828 Personal history of other malignant neoplasm of skin: Secondary | ICD-10-CM | POA: Diagnosis not present

## 2018-02-03 ENCOUNTER — Telehealth: Payer: Self-pay | Admitting: *Deleted

## 2018-02-03 NOTE — Telephone Encounter (Signed)
Patient dropped off BP log.  2/15- 148/82 2/18- 190/92 2/19- 182/100; 160/96  All checked by Nathen May, RN in [place]. He denies any symptoms currently. Just wanted you to be aware of his numbers since stopping meds.

## 2018-02-04 NOTE — Telephone Encounter (Signed)
[place], I'll need you to do this please. I don't have access to a phone. Thanks!

## 2018-02-04 NOTE — Telephone Encounter (Signed)
Blood pressure reading have been high since stopping of amlodipine. I would recommend resuming his amlodipine 2.5 mg daily. Do notify patient.

## 2018-02-04 NOTE — Telephone Encounter (Signed)
Spoke with the patient and his aware that Dr. Bubba Camp wants to resume taking her amlodipine

## 2018-02-18 ENCOUNTER — Ambulatory Visit (INDEPENDENT_AMBULATORY_CARE_PROVIDER_SITE_OTHER): Payer: Medicare Other | Admitting: Orthopaedic Surgery

## 2018-02-18 ENCOUNTER — Encounter (INDEPENDENT_AMBULATORY_CARE_PROVIDER_SITE_OTHER): Payer: Self-pay | Admitting: Orthopaedic Surgery

## 2018-02-18 DIAGNOSIS — S52532D Colles' fracture of left radius, subsequent encounter for closed fracture with routine healing: Secondary | ICD-10-CM

## 2018-02-18 NOTE — Progress Notes (Signed)
The patient is in postop follow-up for fracture care for a left distal radius fracture.  His last x-ray showed the fracture was healing nicely so I decided not to get x-rays today.  He is doing well overall.  He wears a Velcro wrist splint for comfort.  On exam his range of motion is almost entirely full of his left wrist.  There is no gross deformities.  He has improved grip and pinch strength with only slight numbness in his fingers.  I spent time with him showing him exercises to try to improve her strength and function.  All questions and concerns were answered and addressed.  At this point he can follow-up as needed.

## 2018-03-10 ENCOUNTER — Telehealth: Payer: Self-pay | Admitting: Internal Medicine

## 2018-03-10 NOTE — Telephone Encounter (Signed)
I left a message with Mrs. Zalesky asking the pt to call me at [phone] to scheduled AWV-I at [facility] clinic on afternoon of 03/18/18. VDM (DD)

## 2018-03-18 ENCOUNTER — Non-Acute Institutional Stay: Payer: Medicare Other

## 2018-03-18 VITALS — BP 122/66 | HR 62 | Temp 98.1°F | Ht 67.0 in | Wt 186.0 lb

## 2018-03-18 DIAGNOSIS — Z0000 Encounter for general adult medical examination without abnormal findings: Secondary | ICD-10-CM | POA: Diagnosis not present

## 2018-03-18 NOTE — Patient Instructions (Addendum)
Phillip [name] , Thank you for taking time to come for your Medicare Wellness Visit. I appreciate your ongoing commitment to your health goals. Please review the following plan we discussed and let me know if I can assist you in the future.   Screening recommendations/referrals: Colonoscopy excluded, you are over age 82 Recommended yearly ophthalmology/optometry visit for glaucoma screening and checkup Recommended yearly dental visit for hygiene and checkup  Vaccinations: Influenza vaccine up to date, due 2019 fall season Pneumococcal vaccine up to date, completed Tdap vaccine up to date, due 09/04/2026 Shingles vaccine due, prescription sent to pharmacy    Advanced directives: Please fill out health care power of attorney and living will and get notarized  Conditions/risks identified: none  Next appointment: Dr. Bubba Camp 05/27/2018 @ 11:30am   Preventive Care 45 Years and Older, Male Preventive care refers to lifestyle choices and visits with your health care provider that can promote health and wellness. What does preventive care include?  A yearly physical exam. This is also called an annual well check.  Dental exams once or twice a year.  Routine eye exams. Ask your health care provider how often you should have your eyes checked.  Personal lifestyle choices, including:  Daily care of your teeth and gums.  Regular physical activity.  Eating a healthy diet.  Avoiding tobacco and drug use.  Limiting alcohol use.  Practicing safe sex.  Taking low doses of aspirin every day.  Taking vitamin and mineral supplements as recommended by your health care provider. What happens during an annual well check? The services and screenings done by your health care provider during your annual well check will depend on your age, overall health, lifestyle risk factors, and family history of disease. Counseling  Your health care provider may ask you questions about your:  Alcohol  use.  Tobacco use.  Drug use.  Emotional well-being.  Home and relationship well-being.  Sexual activity.  Eating habits.  History of falls.  Memory and ability to understand (cognition).  Work and work Statistician. Screening  You may have the following tests or measurements:  Height, weight, and BMI.  Blood pressure.  Lipid and cholesterol levels. These may be checked every 5 years, or more frequently if you are over 2 years old.  Skin check.  Lung cancer screening. You may have this screening every year starting at age 69 if you have a 30-pack-year history of smoking and currently smoke or have quit within the past 15 years.  Fecal occult blood test (FOBT) of the stool. You may have this test every year starting at age 84.  Flexible sigmoidoscopy or colonoscopy. You may have a sigmoidoscopy every 5 years or a colonoscopy every 10 years starting at age 33.  Prostate cancer screening. Recommendations will vary depending on your family history and other risks.  Hepatitis C blood test.  Hepatitis B blood test.  Sexually transmitted disease (STD) testing.  Diabetes screening. This is done by checking your blood sugar (glucose) after you have not eaten for a while (fasting). You may have this done every 1-3 years.  Abdominal aortic aneurysm (AAA) screening. You may need this if you are a current or former smoker.  Osteoporosis. You may be screened starting at age 74 if you are at high risk. Talk with your health care provider about your test results, treatment options, and if necessary, the need for more tests. Vaccines  Your health care provider may recommend certain vaccines, such as:  Influenza vaccine. This  is recommended every year.  Tetanus, diphtheria, and acellular pertussis (Tdap, Td) vaccine. You may need a Td booster every 10 years.  Zoster vaccine. You may need this after age 43.  Pneumococcal 13-valent conjugate (PCV13) vaccine. One dose is  recommended after age 69.  Pneumococcal polysaccharide (PPSV23) vaccine. One dose is recommended after age 58. Talk to your health care provider about which screenings and vaccines you need and how often you need them. This information is not intended to replace advice given to you by your health care provider. Make sure you discuss any questions you have with your health care provider. Document Released: 12/29/2015 Document Revised: 08/21/2016 Document Reviewed: 10/03/2015 Elsevier Interactive Patient Education  2017 [place] Prevention in the Home Falls can cause injuries. They can happen to people of all ages. There are many things you can do to make your home safe and to help prevent falls. What can I do on the outside of my home?  Regularly fix the edges of walkways and driveways and fix any cracks.  Remove anything that might make you trip as you walk through a door, such as a raised step or threshold.  Trim any bushes or trees on the path to your home.  Use bright outdoor lighting.  Clear any walking paths of anything that might make someone trip, such as rocks or tools.  Regularly check to see if handrails are loose or broken. Make sure that both sides of any steps have handrails.  Any raised decks and porches should have guardrails on the edges.  Have any leaves, snow, or ice cleared regularly.  Use sand or salt on walking paths during winter.  Clean up any spills in your garage right away. This includes oil or grease spills. What can I do in the bathroom?  Use night lights.  Install grab bars by the toilet and in the tub and shower. Do not use towel bars as grab bars.  Use non-skid mats or decals in the tub or shower.  If you need to sit down in the shower, use a plastic, non-slip stool.  Keep the floor dry. Clean up any water that spills on the floor as soon as it happens.  Remove soap buildup in the tub or shower regularly.  Attach bath mats  securely with double-sided non-slip rug tape.  Do not have throw rugs and other things on the floor that can make you trip. What can I do in the bedroom?  Use night lights.  Make sure that you have a light by your bed that is easy to reach.  Do not use any sheets or blankets that are too big for your bed. They should not hang down onto the floor.  Have a firm chair that has side arms. You can use this for support while you get dressed.  Do not have throw rugs and other things on the floor that can make you trip. What can I do in the kitchen?  Clean up any spills right away.  Avoid walking on wet floors.  Keep items that you use a lot in easy-to-reach places.  If you need to reach something above you, use a strong step stool that has a grab bar.  Keep electrical cords out of the way.  Do not use floor polish or wax that makes floors slippery. If you must use wax, use non-skid floor wax.  Do not have throw rugs and other things on the floor that can make you  trip. What can I do with my stairs?  Do not leave any items on the stairs.  Make sure that there are handrails on both sides of the stairs and use them. Fix handrails that are broken or loose. Make sure that handrails are as long as the stairways.  Check any carpeting to make sure that it is firmly attached to the stairs. Fix any carpet that is loose or worn.  Avoid having throw rugs at the top or bottom of the stairs. If you do have throw rugs, attach them to the floor with carpet tape.  Make sure that you have a light switch at the top of the stairs and the bottom of the stairs. If you do not have them, ask someone to add them for you. What else can I do to help prevent falls?  Wear shoes that:  Do not have high heels.  Have rubber bottoms.  Are comfortable and fit you well.  Are closed at the toe. Do not wear sandals.  If you use a stepladder:  Make sure that it is fully opened. Do not climb a closed  stepladder.  Make sure that both sides of the stepladder are locked into place.  Ask someone to hold it for you, if possible.  Clearly mark and make sure that you can see:  Any grab bars or handrails.  First and last steps.  Where the edge of each step is.  Use tools that help you move around (mobility aids) if they are needed. These include:  Canes.  Walkers.  Scooters.  Crutches.  Turn on the lights when you go into a dark area. Replace any light bulbs as soon as they burn out.  Set up your furniture so you have a clear path. Avoid moving your furniture around.  If any of your floors are uneven, fix them.  If there are any pets around you, be aware of where they are.  Review your medicines with your doctor. Some medicines can make you feel dizzy. This can increase your chance of falling. Ask your doctor what other things that you can do to help prevent falls. This information is not intended to replace advice given to you by your health care provider. Make sure you discuss any questions you have with your health care provider. Document Released: 09/28/2009 Document Revised: 05/09/2016 Document Reviewed: 01/06/2015 Elsevier Interactive Patient Education  2017 [employer].

## 2018-03-18 NOTE — Progress Notes (Signed)
Subjective:   Phillip [name] is a 84 y.o. male who presents for an Initial Medicare Annual Wellness Visit at [facility]      Objective:    Today's Vitals   03/18/18 1457  BP: 122/66  Pulse: 62  Temp: 98.1 F (36.7 C)  TempSrc: Oral  SpO2: 98%  Weight: 186 lb (84.4 kg)  Height: 5\' 7"  (1.702 m)   Body mass index is 29.13 kg/m.  Advanced Directives 03/18/2018 01/28/2018 05/27/2017 03/18/2017 03/10/2017 07/23/2016 07/05/2014  Does Patient Have a Medical Advance Directive? No No No No Yes No Patient does not have advance directive;Patient would not like information  Type of Advance Directive - - - - [place];Living will - -  Does patient want to make changes to medical advance directive? - - - - No - Patient declined - -  Copy of [place] in Chart? - - - - No - copy requested - -  Would patient like information on creating a medical advance directive? No - Patient declined Yes (MAU/Ambulatory/Procedural Areas - Information given) - - - - -    Current Medications (verified) Outpatient Encounter Medications as of 03/18/2018  Medication Sig  . acetaminophen (TYLENOL) 500 MG tablet Take 500 mg by mouth as needed.  Marland Kitchen amlodipine-benazepril (LOTREL) 2.5-10 MG capsule Take 1 capsule by mouth daily.  Marland Kitchen ELIQUIS 5 MG TABS tablet TAKE 1 TABLET TWICE A DAY FOR ANTICOAGULATION  . finasteride (PROSCAR) 5 MG tablet TAKE 1 TABLET ONCE DAILY FOR URINATING DRIBBLING/PROSTATE ENLARGEMENT  . irbesartan (AVAPRO) 300 MG tablet Take 1 tablet (300 mg total) by mouth daily.  . ranitidine (ZANTAC) 150 MG tablet TAKE 1 TABLET TWICE A DAY TO REDUCE STOMACH ACID  . simvastatin (ZOCOR) 10 MG tablet TAKE 1 TABLET DAILY TO LOWER CHOLESTEROL  . terazosin (HYTRIN) 2 MG capsule TAKE 1 CAPSULE DAILY  . TOPROL XL 50 MG 24 hr tablet TAKE 1 TABLET TWICE A DAY TO CONTROL BLOOD PRESSURE AND HEART RHYTHM   No facility-administered encounter medications on file  as of 03/18/2018.     Allergies (verified) Ciprofloxacin; Zestril [lisinopril]; and Sulfa antibiotics   History: Past Medical History:  Diagnosis Date  . Atrial fibrillation ([place]) 03/26/2011  . Basal cell carcinoma of other specified sites of skin 4/10/012  . Basal cell carcinoma of skin, site unspecified 03/25/2012  . Benign neoplasm of colon 03/25/2012   adenomatous polyps  . Broken arm 11/25/2017  . Colon polyp 2014  . Degeneration of lumbar or lumbosacral intervertebral disc 09/24/2011  . Disturbance of skin sensation 01/28/2012  . Diverticulosis 2014  . Dysphagia, unspecified(787.20) 03/26/2011  . Elevated prostate specific antigen (PSA) 03/26/2011  . Exposure to unspecified radiation 03/26/2011  . Family history of colon cancer 07/16/2013  . GERD (gastroesophageal reflux disease) 07/16/2013  . Hearing deficit 12/21/2013  . Hemangioma of unspecified site 03/26/2011  . Hx of adenomatous colonic polyps 07/16/2013  . Hypertrophy of prostate with urinary obstruction and other lower urinary tract symptoms (LUTS) 03/26/2011  . Ingrowing nail 03/26/2011  . Internal hemorrhoids without mention of complication 03/22/95  . Keratosis, actinic 01/28/2012  . Long term (current) use of anticoagulants 03/26/2011  . Long term current use of anticoagulant therapy 07/23/2016  . Lumbago 03/26/2011  . 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 Osteoarthrosis, unspecified whether generalized or localized, unspecified site 11/24/2012  . Other and unspecified hyperlipidemia 03/26/2011  . Other malaise and fatigue  03/26/2011  . Pain in joint, forearm 11/24/2012  . Pain in joint, lower leg 06/15/13   left knee  . Paresthesia of left arm 07/05/2014  . Rectal bleed 06/15/13  . Reflux esophagitis 03/26/2011  . Sacroiliitis, not elsewhere classified ([place]) 05/28/2011  . Scoliosis (and kyphoscoliosis), idiopathic 05/28/2011  . 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  Tinnitus 12/21/2013  . 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 Unspecified essential hypertension 03/26/2011   Past Surgical History:  Procedure Laterality Date  . BASAL CELL CARCINOMA EXCISION Left 12/10/14   Dr. Jarome Matin  . COLONOSCOPY  08/12/2013   Dr. Hilarie Fredrickson adenomatous polyps  . CRYOTHERAPY Left 2009   hand  . PHOTODYNAMIC THERAPY  09/26/14   face and ear lobs  Dr. Jarome Matin  . SQUAMOUS CELL CARCINOMA EXCISION Left 2010   hand   Family History  Problem Relation Age of Onset  . Cancer Mother        colon  . Heart disease Father        MI   Social History   Socioeconomic History  . Marital status: Married    Spouse name: Not on file  . Number of children: Not on file  . Years of education: Not on file  . Highest education level: Not on file  Occupational History  . 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 Financial resource strain: Not hard at all  . Food insecurity:    Worry: Never true    Inability: Never true  . Transportation needs:    Medical: No    Non-medical: No  Tobacco Use  . Smoking status: Never Smoker  . Smokeless tobacco: Never Used  Substance and Sexual Activity  . Alcohol use: No  . Drug use: No  . Sexual activity: Not on file  Lifestyle  . Physical activity:    Days per week: 0 days    Minutes per session: 0 min  . Stress: To some extent  Relationships  . Social connections:    Talks on phone: More than three times a week    Gets together: More than three times a week    Attends religious service: Never    Active member of club or organization: No    Attends meetings of clubs or organizations: Never    Relationship status: Married  Other Topics Concern  . Not on file  Social History Narrative   Lives at [facility] since 11/2010   Married - Webb Silversmith   Does not have Living Will   Never smoked   Alcohol none    Exercise: tennis, pool, table tennis 2-3 times a week   Tobacco Counseling Counseling given: Not Answered   Clinical Intake:  Pre-visit preparation completed: No  Pain : No/denies pain     Nutritional Risks: None Diabetes: No  How often do you need to have someone help you when you read instructions, pamphlets, or other written materials from your doctor or pharmacy?: 1 - Never What is the last grade level you completed in school?: College  Interpreter Needed?: No  Information entered by :: Tyson Dense, RN  Activities of Daily Living In your present state of health, do you have any difficulty performing the following activities: 03/18/2018  Hearing? Y  Vision? N  Difficulty concentrating or making decisions? N  Walking or climbing stairs? N  Dressing or bathing? N  Doing errands, shopping? N  Preparing Food and eating ? N  Using the Toilet? N  In the past six months, have you accidently leaked urine? N  Do you have problems with loss of bowel control? N  Managing your Medications? N  Managing your Finances? N  Housekeeping or managing your Housekeeping? N  Some recent data might be hidden     Immunizations and Health Maintenance Immunization History  Administered Date(s) Administered  . Influenza Whole 09/15/2012, 09/16/2013  . Influenza, High Dose Seasonal PF 09/24/2017  . Influenza-Unspecified 09/29/2014, 09/14/2015, 09/26/2016  . PPD Test 11/27/2010  . Pneumococcal Conjugate-13 02/08/2016  . Pneumococcal Polysaccharide-23 01/17/1996  . Td 02/14/2006, 09/04/2016   There are no preventive care reminders to display for this patient.  Patient Care Team: Blanchie Serve, MD as PCP - General (Internal Medicine) Jarome Matin, MD as Consulting Physician (Dermatology) [place], [place], Nelda Bucks, NP as Nurse Practitioner (Family Medicine)  Indicate any recent Medical Services you may have received from other than [facility] providers in the past year (date may  be approximate).    Assessment:   This is a routine wellness examination for [place].  Hearing/Vision screen Hearing Screening Comments: Pt previously had hearing aids and didn't like them Vision Screening Comments: Sees eye doctor annually  Dietary issues and exercise activities discussed: Current Exercise Habits: The patient does not participate in regular exercise at present, Exercise limited by: None identified  Goals    None     Depression Screen PHQ 2/9 Scores 03/18/2018 10/01/2017 07/23/2016 01/12/2015  PHQ - 2 Score 0 0 0 0    Fall Risk Fall Risk  03/18/2018 10/01/2017 08/09/2016 07/23/2016 01/12/2015  Falls in the past year? Yes No No No No  Comment - - Emmi Telephone Survey: data to providers prior to load - -  Number falls in past yr: 1 - - - -  Injury with Fall? Yes - - - -  Comment broken arm - - - -    Is the patient's home free of loose throw rugs in walkways, pet beds, electrical cords, etc?   yes      Grab bars in the bathroom? yes      Handrails on the stairs?   yes      Adequate lighting?   yes  Cognitive Function: Within normal function MMSE - Mini Mental State Exam 01/28/2018  Not completed: (No Data)  Orientation to time 4  Orientation to Place 5  Registration 3  Attention/ Calculation 5  Recall 3  Language- name 2 objects 2  Language- repeat 1  Language- follow 3 step command 3  Language- read & follow direction 1  Write a sentence 1  Copy design 1  Total score 29        Screening Tests Health Maintenance  Topic Date Due  . INFLUENZA VACCINE  07/16/2018  . COLONOSCOPY  08/12/2018  . TETANUS/TDAP  09/04/2026  . PNA vac Low Risk Adult  Completed    Qualifies for Shingles Vaccine? Yes, on waiting list at pharmacy  Cancer Screenings: Lung: Low Dose CT Chest recommended if Age 60-80 years, 30 pack-year currently smoking OR have quit w/in 15years. Patient does not qualify. Colorectal: up to date  Additional Screenings:  Hepatitis C Screening:  declined      Plan:    I have personally reviewed and addressed the Medicare Annual Wellness questionnaire and have noted the following in the patient's chart:  A.  Medical and social history B. Use of alcohol, tobacco or illicit drugs  C. Current medications and supplements D. Functional ability and status E.  Nutritional status F.  Physical activity G. Advance directives H. List of other physicians I.  Hospitalizations, surgeries, and ER visits in previous 12 months J.  [place] to include hearing, vision, cognitive, depression L. Referrals and appointments - none  In addition, I have reviewed and discussed with patient certain preventive protocols, quality metrics, and best practice recommendations. A written personalized care plan for preventive services as well as general preventive health recommendations were provided to patient.  See attached scanned questionnaire for additional information.   Signed,   Tyson Dense, RN Nurse Health Advisor  Patient Concerns: None

## 2018-03-19 ENCOUNTER — Telehealth: Payer: Self-pay

## 2018-03-19 NOTE — Telephone Encounter (Signed)
I spoke with [facility] and was told that candesartan and losartan is an alternative to irbesartan.

## 2018-03-19 NOTE — Telephone Encounter (Signed)
Can you call [facility] to know what ARBs (angiotensin II receptor blockers) do they have in their formulary? This way, I can send them the right alternative. Thank you!

## 2018-03-19 NOTE — Telephone Encounter (Signed)
Patient called to say that he received notification from [facility] that irbesartan is unavailable. Patient would like to know if there is an alternative medication that could be sent to [facility].

## 2018-03-20 ENCOUNTER — Other Ambulatory Visit: Payer: Self-pay

## 2018-03-20 DIAGNOSIS — I10 Essential (primary) hypertension: Secondary | ICD-10-CM

## 2018-03-20 MED ORDER — LOSARTAN POTASSIUM 100 MG PO TABS: 100.0000 mg | ORAL_TABLET | Freq: Every day | ORAL | 3 refills | Status: DC

## 2018-03-20 NOTE — Telephone Encounter (Signed)
le

## 2018-03-20 NOTE — Telephone Encounter (Signed)
Ok. Discontinue irbesartan put reason as: not available by pharmacy.  Send script for losartan 100 mg daily 90 pills 3 refills. Notify patient once script is sent.

## 2018-03-20 NOTE — Telephone Encounter (Signed)
Left voicemail letting the patient know that his script for Losartan 100 mg daily was sent to his pharmacy at [facility]. I left the patient the clinic phone number in case he had any question or concerns.

## 2018-03-30 ENCOUNTER — Other Ambulatory Visit: Payer: Self-pay | Admitting: Internal Medicine

## 2018-05-09 ENCOUNTER — Other Ambulatory Visit: Payer: Self-pay | Admitting: Internal Medicine

## 2018-05-15 DIAGNOSIS — N401 Enlarged prostate with lower urinary tract symptoms: Secondary | ICD-10-CM | POA: Diagnosis not present

## 2018-05-15 DIAGNOSIS — R350 Frequency of micturition: Secondary | ICD-10-CM | POA: Diagnosis not present

## 2018-05-15 DIAGNOSIS — K219 Gastro-esophageal reflux disease without esophagitis: Secondary | ICD-10-CM | POA: Diagnosis not present

## 2018-05-15 DIAGNOSIS — I482 Chronic atrial fibrillation: Secondary | ICD-10-CM | POA: Diagnosis not present

## 2018-05-15 DIAGNOSIS — I10 Essential (primary) hypertension: Secondary | ICD-10-CM | POA: Diagnosis not present

## 2018-05-18 DIAGNOSIS — I482 Chronic atrial fibrillation, unspecified: Secondary | ICD-10-CM

## 2018-05-18 DIAGNOSIS — I10 Essential (primary) hypertension: Secondary | ICD-10-CM

## 2018-05-18 DIAGNOSIS — N401 Enlarged prostate with lower urinary tract symptoms: Secondary | ICD-10-CM

## 2018-05-18 DIAGNOSIS — R350 Frequency of micturition: Secondary | ICD-10-CM

## 2018-05-18 DIAGNOSIS — K219 Gastro-esophageal reflux disease without esophagitis: Secondary | ICD-10-CM

## 2018-05-18 LAB — CBC WITH DIFFERENTIAL/PLATELET
BASOS ABS: 28 {cells}/uL (ref 0–200)
BASOS PCT: 0.5 %
Eosinophils Absolute: 246 cells/uL (ref 15–500)
Eosinophils Relative: 4.4 %
HEMATOCRIT: 38.5 % (ref 38.5–50.0)
HEMOGLOBIN: 13.4 g/dL (ref 13.2–17.1)
Lymphs Abs: 1551 cells/uL (ref 850–3900)
MCH: 32 pg (ref 27.0–33.0)
MCHC: 34.8 g/dL (ref 32.0–36.0)
MCV: 91.9 fL (ref 80.0–100.0)
MPV: 9.2 fL (ref 7.5–12.5)
Monocytes Relative: 9.2 %
NEUTROS ABS: 3259 {cells}/uL (ref 1500–7800)
Neutrophils Relative %: 58.2 %
Platelets: 177 10*3/uL (ref 140–400)
RBC: 4.19 10*6/uL — ABNORMAL LOW (ref 4.20–5.80)
RDW: 12.9 % (ref 11.0–15.0)
Total Lymphocyte: 27.7 %
WBC: 5.6 10*3/uL (ref 3.8–10.8)
WBCMIX: 515 {cells}/uL (ref 200–950)

## 2018-05-18 LAB — COMPLETE METABOLIC PANEL WITH GFR
AG Ratio: 1.7 (calc) (ref 1.0–2.5)
ALKALINE PHOSPHATASE (APISO): 77 U/L (ref 40–115)
ALT: 10 U/L (ref 9–46)
AST: 16 U/L (ref 10–35)
Albumin: 3.9 g/dL (ref 3.6–5.1)
BUN/Creatinine Ratio: 11 (calc) (ref 6–22)
BUN: 12 mg/dL (ref 7–25)
CALCIUM: 9.2 mg/dL (ref 8.6–10.3)
CO2: 27 mmol/L (ref 20–32)
CREATININE: 1.12 mg/dL — AB (ref 0.70–1.11)
Chloride: 107 mmol/L (ref 98–110)
GFR, EST NON AFRICAN AMERICAN: 59 mL/min/{1.73_m2} — AB (ref >=60)
GFR, Est African American: 68 mL/min/{1.73_m2} (ref >=60)
GLUCOSE: 94 mg/dL (ref 65–99)
Globulin: 2.3 g/dL (calc) (ref 1.9–3.7)
Potassium: 4 mmol/L (ref 3.5–5.3)
Sodium: 142 mmol/L (ref 135–146)
Total Bilirubin: 0.7 mg/dL (ref 0.2–1.2)
Total Protein: 6.2 g/dL (ref 6.1–8.1)

## 2018-05-18 LAB — LIPID PANEL
CHOL/HDL RATIO: 2.9 (calc) (ref <5.0)
Cholesterol: 131 mg/dL (ref <200)
HDL: 45 mg/dL (ref >40)
LDL CHOLESTEROL (CALC): 69 mg/dL
NON-HDL CHOLESTEROL (CALC): 86 mg/dL (ref <130)
Triglycerides: 83 mg/dL (ref <150)

## 2018-05-27 ENCOUNTER — Non-Acute Institutional Stay: Payer: Medicare Other | Admitting: Internal Medicine

## 2018-05-27 ENCOUNTER — Encounter: Payer: Self-pay | Admitting: Internal Medicine

## 2018-05-27 VITALS — BP 126/60 | HR 65 | Temp 98.0°F | Resp 18 | Ht 67.0 in | Wt 192.2 lb

## 2018-05-27 DIAGNOSIS — R300 Dysuria: Secondary | ICD-10-CM | POA: Diagnosis not present

## 2018-05-27 DIAGNOSIS — R350 Frequency of micturition: Secondary | ICD-10-CM

## 2018-05-27 DIAGNOSIS — I482 Chronic atrial fibrillation, unspecified: Secondary | ICD-10-CM

## 2018-05-27 DIAGNOSIS — N183 Chronic kidney disease, stage 3 unspecified: Secondary | ICD-10-CM

## 2018-05-27 DIAGNOSIS — R200 Anesthesia of skin: Secondary | ICD-10-CM | POA: Diagnosis not present

## 2018-05-27 DIAGNOSIS — E782 Mixed hyperlipidemia: Secondary | ICD-10-CM | POA: Diagnosis not present

## 2018-05-27 DIAGNOSIS — K219 Gastro-esophageal reflux disease without esophagitis: Secondary | ICD-10-CM

## 2018-05-27 DIAGNOSIS — N401 Enlarged prostate with lower urinary tract symptoms: Secondary | ICD-10-CM

## 2018-05-27 DIAGNOSIS — S52532D Colles' fracture of left radius, subsequent encounter for closed fracture with routine healing: Secondary | ICD-10-CM | POA: Diagnosis not present

## 2018-05-27 DIAGNOSIS — I10 Essential (primary) hypertension: Secondary | ICD-10-CM | POA: Diagnosis not present

## 2018-05-27 MED ORDER — LOSARTAN POTASSIUM 100 MG PO TABS: 100.0000 mg | ORAL_TABLET | Freq: Every day | ORAL | 3 refills | Status: DC

## 2018-05-27 MED ORDER — PHENAZOPYRIDINE HCL 100 MG PO TABS: 100.0000 mg | ORAL_TABLET | Freq: Two times a day (BID) | ORAL | 0 refills | Status: DC

## 2018-05-27 NOTE — Progress Notes (Signed)
[facility]  Provider: Blanchie Serve MD   Location:  [place] of Service:  Clinic (12)  PCP: Blanchie Serve, MD Patient Care Team: Blanchie Serve, MD as PCP - General (Internal Medicine) Jarome Matin, MD as Consulting Physician (Dermatology) [place], [place], Nelda Bucks, NP as Nurse Practitioner [facility])  Extended Emergency Contact Information Primary Emergency Contact: [facility] Address: [place], [place] [address] [address]nette Litter of [place] Phone: [phone] Relation: Daughter Secondary Emergency Contact: Filiberto Pinks Address: [address]          [place], [place] [address] [place] Phone: [phone] Relation: Spouse   Goals of Care: Advanced Directive information Advanced Directives 03/18/2018  Does Patient Have a Medical Advance Directive? No  Type of Advance Directive -  Does patient want to make changes to medical advance directive? -  Copy of [place] in Chart? -  Would patient like information on creating a medical advance directive? No - Patient declined      Chief Complaint  Patient presents with  . Medical Management of Chronic Issues    4 month follow up. Patient mentioned that he feels like has a UTI. He has some burning sensation that comes and goes x 1 week. He also mentioned that he has some left wrist pain but it has improved.   . Medication Refill    Not sure at this time.   Marland Kitchen Results    Discuss labs   . other    Patient has brought in some blood pressure readings with him today.     HPI: Patient is a 84 y.o. male seen today for routine visit.   Dysuria- for a month on and off, denies trouble with flow or amount of urination, dark colored urine. No CVA tenderness or abdominal discomfort. Tries and keeps himself hydrated. Has BPH  HTN- controlled BP, reviewed home reading, on amlodipine 2.5 mg daily with losartan  and metoprolol  afib- denies palpitation, tolerating metoprolol and eliquis well. No bleed reported  Hyperlipidemia- reviewed lipid panel result, taking simvastatin  Past Medical History:  Diagnosis Date  . Atrial fibrillation ([place]) 03/26/2011  . Basal cell carcinoma of other specified sites of skin 4/10/012  . Basal cell carcinoma of skin, site unspecified 03/25/2012  . Benign neoplasm of colon 03/25/2012   adenomatous polyps  . Broken arm 11/25/2017  . Colon polyp 2014  . Degeneration of lumbar or lumbosacral intervertebral disc 09/24/2011  . Disturbance of skin sensation 01/28/2012  . Diverticulosis 2014  . Dysphagia, unspecified(787.20) 03/26/2011  . Elevated prostate specific antigen (PSA) 03/26/2011  . Exposure to unspecified radiation 03/26/2011  . Family history of colon cancer 07/16/2013  . GERD (gastroesophageal reflux disease) 07/16/2013  . Hearing deficit 12/21/2013  . Hemangioma of unspecified site 03/26/2011  . Hx of adenomatous colonic polyps 07/16/2013  . Hypertrophy of prostate with urinary obstruction and other lower urinary tract symptoms (LUTS) 03/26/2011  . Ingrowing nail 03/26/2011  . Internal hemorrhoids without mention of complication 07/25/15  . Keratosis, actinic 01/28/2012  . Long term (current) use of anticoagulants 03/26/2011  . Long term current use of anticoagulant therapy 07/23/2016  . Lumbago 03/26/2011  . 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 Osteoarthrosis, unspecified whether generalized or localized, unspecified site 11/24/2012  . Other and unspecified hyperlipidemia 03/26/2011  . Other  malaise and fatigue 03/26/2011  . Pain in joint, forearm 11/24/2012  . Pain in joint, lower leg 06/15/13   left knee  . Paresthesia of left arm 07/05/2014  . Rectal bleed 06/15/13  . Reflux esophagitis 03/26/2011  . Sacroiliitis, not elsewhere classified ([facility]) 05/28/2011  . Scoliosis (and kyphoscoliosis), idiopathic 05/28/2011  . 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 Tinnitus 12/21/2013  . 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 Unspecified essential hypertension 03/26/2011   Past Surgical History:  Procedure Laterality Date  . BASAL CELL CARCINOMA EXCISION Left 12/10/14   Dr. Jarome Matin  . COLONOSCOPY  08/12/2013   Dr. Hilarie Fredrickson adenomatous polyps  . CRYOTHERAPY Left 2009   hand  . PHOTODYNAMIC THERAPY  09/26/14   face and ear lobs  Dr. Jarome Matin  . SQUAMOUS CELL CARCINOMA EXCISION Left 2010   hand    reports that he has never smoked. He has never used smokeless tobacco. He reports that he does not drink alcohol or use drugs. Social History   Socioeconomic History  . Marital status: Married    Spouse name: Not on file  . Number of children: Not on file  . Years of education: Not on file  . Highest education level: Not on file  Occupational History  . 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 Financial resource strain: Not hard at all  . Food insecurity:    Worry: Never true    Inability: Never true  . Transportation needs:    Medical: No    Non-medical: No  Tobacco Use  . Smoking status: Never Smoker  . Smokeless tobacco: Never Used  Substance and Sexual Activity  . Alcohol use: No  . Drug use: No  . Sexual activity: Not on file  Lifestyle  . Physical activity:    Days per week: 0 days    Minutes per session: 0 min  . Stress: To some extent  Relationships  . Social connections:    Talks on phone: More than three times a week    Gets together: More than three times a week    Attends religious service: Never    Active member of club or organization: No    Attends meetings of clubs or organizations: Never    Relationship status: Married  . Intimate partner violence:    Fear of current or ex partner: No    Emotionally abused: No    Physically abused: No    Forced  sexual activity: No  Other Topics Concern  . Not on file  Social History Narrative   Lives at [facility] since 11/2010   Married - [employer]   Does not have Living Will   Never smoked   Alcohol none   Exercise: tennis, pool, table tennis 2-3 times a week    Functional Status Survey:    Family History  Problem Relation Age of Onset  . Cancer Mother        colon  . Heart disease Father        MI    Health Maintenance  Topic Date Due  . INFLUENZA VACCINE  07/16/2018  . COLONOSCOPY  08/12/2018  . TETANUS/TDAP  09/04/2026  . PNA vac Low Risk Adult  Completed    Allergies  Allergen Reactions  . Ciprofloxacin Other (See Comments)    Patient doesn't like the way  it makes him feel  . Zestril [Lisinopril]     unknown  . Sulfa Antibiotics Rash    Outpatient Encounter Medications as of 05/27/2018  Medication Sig  . acetaminophen (TYLENOL) 500 MG tablet Take 500 mg by mouth as needed.  Marland Kitchen AMLODIPINE BESYLATE PO Take 2.5 mg by mouth daily.  Marland Kitchen ELIQUIS 5 MG TABS tablet TAKE 1 TABLET TWICE A DAY FOR ANTICOAGULATION  . finasteride (PROSCAR) 5 MG tablet TAKE 1 TABLET DAILY FOR URINATING DRIBBLING/ PROSTATE ENLARGEMENT  . losartan (COZAAR) 100 MG tablet Take 1 tablet (100 mg total) by mouth daily.  . ranitidine (ZANTAC) 150 MG tablet TAKE 1 TABLET TWICE A DAY TO REDUCE STOMACH ACID  . simvastatin (ZOCOR) 10 MG tablet TAKE 1 TABLET DAILY TO LOWER CHOLESTEROL  . terazosin (HYTRIN) 2 MG capsule TAKE 1 CAPSULE DAILY  . TOPROL XL 50 MG 24 hr tablet TAKE 1 TABLET TWICE A DAY TO CONTROL BLOOD PRESSURE AND HEART RHYTHM   No facility-administered encounter medications on file as of 05/27/2018.     Review of Systems  Constitutional: Negative for appetite change, chills and fever.  HENT: Positive for hearing loss. Negative for congestion, rhinorrhea and trouble swallowing.   Eyes: Positive for visual disturbance.  Respiratory: Negative for cough, shortness of breath and wheezing.     Cardiovascular: Negative for chest pain, palpitations and leg swelling.  Gastrointestinal: Negative for abdominal pain, blood in stool, constipation, diarrhea, nausea and vomiting.       Keeping himself hydration  Genitourinary: Positive for dysuria. Negative for decreased urine volume, difficulty urinating, discharge, flank pain, frequency, hematuria, penile pain and urgency.       Has been having intermittent burning with urination for about a month.   Neurological: Positive for numbness. Negative for tremors, seizures, weakness, light-headedness and headaches.       Had episode of lightheadedness a month back for a day that has now resolved. Has numbness to finger tips of both hands for some time now    Vitals:   05/27/18 1134  BP: 126/60  Pulse: 65  Resp: 18  Temp: 98 F (36.7 C)  TempSrc: Oral  SpO2: 98%  Weight: 192 lb 3.2 oz (87.2 kg)  Height: '5\' 7"'$  (1.702 m)   Body mass index is 30.1 kg/m.   Wt Readings from Last 3 Encounters:  05/27/18 192 lb 3.2 oz (87.2 kg)  03/18/18 186 lb (84.4 kg)  01/28/18 185 lb 9.6 oz (84.2 kg)   Physical Exam  Constitutional: He is oriented to person, place, and time. He appears well-developed. No distress.  Obese elderly male  HENT:  Head: Normocephalic and atraumatic.  Nose: Nose normal.  Mouth/Throat: Oropharynx is clear and moist. No oropharyngeal exudate.  Eyes: Pupils are equal, round, and reactive to light. Conjunctivae and EOM are normal. Right eye exhibits no discharge. Left eye exhibits no discharge.  Neck: Normal range of motion. Neck supple.  Cardiovascular: Intact distal pulses.  Irregular HR  Pulmonary/Chest: Effort normal and breath sounds normal. He has no wheezes.  Abdominal: Soft. Bowel sounds are normal. He exhibits no mass. There is no tenderness.  No CVA tenderness and no suprapubic tenderness  Musculoskeletal: He exhibits edema.  Trace leg edema, right shoulder ROM limited with OA, left wrist ROM good (s/p radius  fracture in past)  Lymphadenopathy:    He has no cervical adenopathy.  Neurological: He is alert and oriented to person, place, and time. He exhibits normal muscle tone.  Skin: Skin is  warm and dry. He is not diaphoretic.  Psychiatric: He has a normal mood and affect. His behavior is normal.    Labs reviewed: Basic Metabolic Panel: Recent Labs    09/22/17 0000 01/01/18 0000 05/15/18 0000  NA 142 140 142  K 3.7 3.9 4.0  CL 108 104 107  CO2 '26 27 27  '$ GLUCOSE 89 80 94  BUN '15 13 12  '$ CREATININE 1.23* 1.15* 1.12*  CALCIUM 9.2 9.8 9.2   Liver Function Tests: Recent Labs    09/22/17 0000 01/01/18 0000 05/15/18 0000  AST '17 14 16  '$ ALT '11 9 10  '$ BILITOT 0.5 1.1 0.7  PROT 6.4 6.4 6.2   No results for input(s): LIPASE, AMYLASE in the last 8760 hours. No results for input(s): AMMONIA in the last 8760 hours. CBC: Recent Labs    01/01/18 0000 05/15/18 0000  WBC 6.5 5.6  NEUTROABS 3,842 3,259  HGB 13.5 13.4  HCT 38.8 38.5  MCV 91.9 91.9  PLT 185 177   Cardiac Enzymes: No results for input(s): CKTOTAL, CKMB, CKMBINDEX, TROPONINI in the last 8760 hours. BNP: Invalid input(s): POCBNP No results found for: HGBA1C No results found for: TSH No results found for: VITAMINB12 No results found for: FOLATE No results found for: IRON, TIBC, FERRITIN  Lipid Panel: Recent Labs    09/22/17 0000 05/15/18 0000  CHOL 132 131  HDL 43 45  LDLCALC 64 69  TRIG 171* 83  CHOLHDL 3.1 2.9   No results found for: HGBA1C  Procedures since last visit: No results found.  Assessment/Plan  1. Dysuria No signs of infection on ROS and exam. Encouraged hydration. Pyridium 100 mg bid x 3 days. If no improvement, will send u/a with c/s - phenazopyridine (PYRIDIUM) 100 MG tablet; Take 1 tablet (100 mg total) by mouth 2 (two) times daily.  Dispense: 10 tablet; Refill: 0  2. Chronic atrial fibrillation (HCC) Continue metoprolol succinate 50 mg bid with eliquis 5 mg bid - CMP with  eGFR([facility]); Future - Lipid Panel; Future - CBC (no diff); Future  3. Essential hypertension C/w metoprolol succinate, losartan and amlodipine. Reviewed renal function - CMP with eGFR([facility]); Future - Lipid Panel; Future - BMP with eGFR([facility]); Future  4. Gastroesophageal reflux disease without esophagitis Continue ranitidine and monitor  5. Closed Colles' fracture of left radius with routine healing Advised to take tylenol 500 mg bid a day before and after tennis and on day of tennis. ROM exercise.   6. Benign prostatic hyperplasia with urinary frequency Continue finsateride and terazosin, monitor  7. Numbness of fingers of both hands Supportive care, check b12 level - Vitamin B12; Future  8. Mixed hyperlipidemia Continue simvastatin, LDL at goal.   9. CKD (chronic kidney disease) stage 3, GFR 30-59 ml/min (HCC) Avoid NSAIDs. Stable CBC - CMP with eGFR([facility]); Future - CBC (no diff); Future - BMP with eGFR([facility]); Future    Labs/tests ordered:   Lab Orders     CMP with eGFR([facility])     Lipid Panel     CBC (no diff)     Vitamin B12     BMP with eGFR([facility])   Next appointment: 6 months for physical  Communication: reviewed care plan with patient     Blanchie Serve, MD Internal Medicine [place], [place] [address] Cell Phone (Monday-Friday 8 am - 5 pm): [phone] On Call: [phone] and follow prompts after 5 pm and on weekends Office Phone: [phone] Office Fax:  [phone] 

## 2018-06-04 ENCOUNTER — Other Ambulatory Visit: Payer: Self-pay | Admitting: Internal Medicine

## 2018-06-07 ENCOUNTER — Encounter: Payer: Self-pay | Admitting: Internal Medicine

## 2018-06-27 ENCOUNTER — Other Ambulatory Visit: Payer: Self-pay | Admitting: Internal Medicine

## 2018-07-29 ENCOUNTER — Encounter: Payer: Self-pay | Admitting: Internal Medicine

## 2018-08-11 ENCOUNTER — Other Ambulatory Visit: Payer: Self-pay | Admitting: *Deleted

## 2018-08-11 DIAGNOSIS — N183 Chronic kidney disease, stage 3 unspecified: Secondary | ICD-10-CM

## 2018-08-11 DIAGNOSIS — I482 Chronic atrial fibrillation, unspecified: Secondary | ICD-10-CM

## 2018-08-11 DIAGNOSIS — I10 Essential (primary) hypertension: Secondary | ICD-10-CM

## 2018-08-11 DIAGNOSIS — R200 Anesthesia of skin: Secondary | ICD-10-CM

## 2018-08-13 DIAGNOSIS — L821 Other seborrheic keratosis: Secondary | ICD-10-CM | POA: Diagnosis not present

## 2018-08-13 DIAGNOSIS — D1801 Hemangioma of skin and subcutaneous tissue: Secondary | ICD-10-CM | POA: Diagnosis not present

## 2018-08-13 DIAGNOSIS — L812 Freckles: Secondary | ICD-10-CM | POA: Diagnosis not present

## 2018-08-13 DIAGNOSIS — Z85828 Personal history of other malignant neoplasm of skin: Secondary | ICD-10-CM | POA: Diagnosis not present

## 2018-08-13 DIAGNOSIS — L570 Actinic keratosis: Secondary | ICD-10-CM | POA: Diagnosis not present

## 2018-08-14 ENCOUNTER — Other Ambulatory Visit: Payer: Self-pay | Admitting: Internal Medicine

## 2018-08-26 DIAGNOSIS — N183 Chronic kidney disease, stage 3 (moderate): Secondary | ICD-10-CM | POA: Diagnosis not present

## 2018-08-26 DIAGNOSIS — I10 Essential (primary) hypertension: Secondary | ICD-10-CM | POA: Diagnosis not present

## 2018-08-26 DIAGNOSIS — R200 Anesthesia of skin: Secondary | ICD-10-CM | POA: Diagnosis not present

## 2018-08-26 DIAGNOSIS — I482 Chronic atrial fibrillation: Secondary | ICD-10-CM | POA: Diagnosis not present

## 2018-08-27 DIAGNOSIS — I10 Essential (primary) hypertension: Secondary | ICD-10-CM

## 2018-08-27 DIAGNOSIS — I482 Chronic atrial fibrillation, unspecified: Secondary | ICD-10-CM

## 2018-08-27 DIAGNOSIS — R200 Anesthesia of skin: Secondary | ICD-10-CM

## 2018-08-27 DIAGNOSIS — N183 Chronic kidney disease, stage 3 unspecified: Secondary | ICD-10-CM

## 2018-08-27 LAB — CBC
HEMATOCRIT: 38.9 % (ref 38.5–50.0)
Hemoglobin: 13.3 g/dL (ref 13.2–17.1)
MCH: 32.1 pg (ref 27.0–33.0)
MCHC: 34.2 g/dL (ref 32.0–36.0)
MCV: 94 fL (ref 80.0–100.0)
MPV: 9.4 fL (ref 7.5–12.5)
PLATELETS: 177 10*3/uL (ref 140–400)
RBC: 4.14 10*6/uL — ABNORMAL LOW (ref 4.20–5.80)
RDW: 13.1 % (ref 11.0–15.0)
WBC: 7 10*3/uL (ref 3.8–10.8)

## 2018-08-27 LAB — COMPLETE METABOLIC PANEL WITH GFR
AG Ratio: 1.5 (calc) (ref 1.0–2.5)
ALT: 10 U/L (ref 9–46)
AST: 15 U/L (ref 10–35)
Albumin: 3.8 g/dL (ref 3.6–5.1)
Alkaline phosphatase (APISO): 74 U/L (ref 40–115)
BILIRUBIN TOTAL: 0.8 mg/dL (ref 0.2–1.2)
BUN/Creatinine Ratio: 12 (calc) (ref 6–22)
BUN: 15 mg/dL (ref 7–25)
CALCIUM: 9.4 mg/dL (ref 8.6–10.3)
CHLORIDE: 109 mmol/L (ref 98–110)
CO2: 26 mmol/L (ref 20–32)
CREATININE: 1.21 mg/dL — AB (ref 0.70–1.11)
GFR, EST AFRICAN AMERICAN: 62 mL/min/{1.73_m2} (ref >=60)
GFR, EST NON AFRICAN AMERICAN: 54 mL/min/{1.73_m2} — AB (ref >=60)
Globulin: 2.5 g/dL (calc) (ref 1.9–3.7)
Glucose, Bld: 91 mg/dL (ref 65–99)
Potassium: 4 mmol/L (ref 3.5–5.3)
Sodium: 142 mmol/L (ref 135–146)
TOTAL PROTEIN: 6.3 g/dL (ref 6.1–8.1)

## 2018-08-27 LAB — LIPID PANEL
CHOL/HDL RATIO: 3.1 (calc) (ref <5.0)
Cholesterol: 136 mg/dL (ref <200)
HDL: 44 mg/dL (ref >40)
LDL CHOLESTEROL (CALC): 74 mg/dL
NON-HDL CHOLESTEROL (CALC): 92 mg/dL (ref <130)
TRIGLYCERIDES: 93 mg/dL (ref <150)

## 2018-08-27 LAB — VITAMIN B12: Vitamin B-12: 324 pg/mL (ref 200–1100)

## 2018-09-02 ENCOUNTER — Other Ambulatory Visit: Payer: Self-pay | Admitting: Internal Medicine

## 2018-09-29 ENCOUNTER — Other Ambulatory Visit: Payer: Self-pay | Admitting: *Deleted

## 2018-09-29 MED ORDER — APIXABAN 5 MG PO TABS: ORAL_TABLET | ORAL | 3 refills | Status: DC

## 2018-10-15 ENCOUNTER — Encounter: Payer: Self-pay | Admitting: Family Medicine

## 2018-11-06 ENCOUNTER — Other Ambulatory Visit: Payer: Self-pay | Admitting: *Deleted

## 2018-11-06 MED ORDER — FINASTERIDE 5 MG PO TABS: ORAL_TABLET | ORAL | 1 refills | Status: DC

## 2018-11-06 NOTE — Telephone Encounter (Signed)
[facility]

## 2018-12-02 ENCOUNTER — Encounter: Payer: Self-pay | Admitting: Internal Medicine

## 2018-12-03 ENCOUNTER — Encounter: Payer: Self-pay | Admitting: Family

## 2018-12-15 ENCOUNTER — Encounter: Payer: Self-pay | Admitting: Family

## 2018-12-15 ENCOUNTER — Non-Acute Institutional Stay: Payer: Medicare Other | Admitting: Family Medicine

## 2018-12-15 ENCOUNTER — Encounter: Payer: Self-pay | Admitting: Family Medicine

## 2018-12-15 VITALS — BP 140/92 | HR 66 | Temp 97.6°F | Ht 67.0 in | Wt 195.2 lb

## 2018-12-15 DIAGNOSIS — I480 Paroxysmal atrial fibrillation: Secondary | ICD-10-CM

## 2018-12-15 DIAGNOSIS — M25511 Pain in right shoulder: Secondary | ICD-10-CM

## 2018-12-15 DIAGNOSIS — G8929 Other chronic pain: Secondary | ICD-10-CM | POA: Diagnosis not present

## 2018-12-15 DIAGNOSIS — I10 Essential (primary) hypertension: Secondary | ICD-10-CM

## 2018-12-15 DIAGNOSIS — D126 Benign neoplasm of colon, unspecified: Secondary | ICD-10-CM | POA: Diagnosis not present

## 2018-12-15 DIAGNOSIS — R972 Elevated prostate specific antigen [PSA]: Secondary | ICD-10-CM

## 2018-12-15 DIAGNOSIS — E782 Mixed hyperlipidemia: Secondary | ICD-10-CM

## 2018-12-15 MED ORDER — DICLOFENAC SODIUM 1 % TD GEL: 2.0000 g | Freq: Four times a day (QID) | TRANSDERMAL | 1 refills | Status: DC

## 2018-12-15 NOTE — Progress Notes (Signed)
Provider:  Alain Honey, MD Location:  [place] of Service:  Clinic (12)  PCP: Sandrea Hughs, NP Patient Care Team: Ngetich, Nelda Bucks, NP as PCP - General (Family Medicine) Jarome Matin, MD as Consulting Physician (Dermatology) [place], [facility] Wardell Honour, MD as Attending Physician [facility] Medicine)  Extended Emergency Contact Information Primary Emergency Contact: [facility] Address: [place], [place] [address] Johnnette Litter of [place] Phone: [phone] Relation: Daughter Secondary Emergency Contact: Filiberto Pinks Address: [address]          [place], [place] [address] [place] Phone: [phone] Relation: Spouse  Code Status:  Goals of Care: Advanced Directive information Advanced Directives 12/15/2018  Does Patient Have a Medical Advance Directive? No  Type of Advance Directive -  Does patient want to make changes to medical advance directive? -  Copy of [place] in Chart? -  Would patient like information on creating a medical advance directive? No - Patient declined      Chief Complaint  Patient presents with  . Medical Management of Chronic Issues    routine visit,phq9 done on 03/18/2018 as well as mmse    HPI: Patient is a 84 y.o. male seen today for follow-up of atrial fibrillation, hypertension, BPH, and hyperlipidemia.  His only complaint today is continued right shoulder discomfort.  He still plays tennis at age 41 but has some difficulty with serving and overhead because of his shoulder.  He has seen sports medicine physician and received at least 1 injection without much effect.  There is been no injury or trauma.  X-ray at the time of that visit showed some degenerative changes.  He did not have ultrasound to assess rotator cuff tendons.  At his last visit here he was having some dysuria and was given Pyridium.  He does not take that any longer and  denies symptoms.  He gets up once per night.  He does take finasteride and Terazosin for enlarged prostate.  Past Medical History:  Diagnosis Date  . Atrial fibrillation ([place]) 03/26/2011  . Basal cell carcinoma of other specified sites of skin 4/10/012  . Basal cell carcinoma of skin, site unspecified 03/25/2012  . Benign neoplasm of colon 03/25/2012   adenomatous polyps  . Broken arm 11/25/2017  . Colon polyp 2014  . Degeneration of lumbar or lumbosacral intervertebral disc 09/24/2011  . Disturbance of skin sensation 01/28/2012  . Diverticulosis 2014  . Dysphagia, unspecified(787.20) 03/26/2011  . Elevated prostate specific antigen (PSA) 03/26/2011  . Exposure to unspecified radiation 03/26/2011  . Family history of colon cancer 07/16/2013  . GERD (gastroesophageal reflux disease) 07/16/2013  . Hearing deficit 12/21/2013  . Hemangioma of unspecified site 03/26/2011  . Hx of adenomatous colonic polyps 07/16/2013  . Hypertrophy of prostate with urinary obstruction and other lower urinary tract symptoms (LUTS) 03/26/2011  . Ingrowing nail 03/26/2011  . Internal hemorrhoids without mention of complication 12/24/60  . Keratosis, actinic 01/28/2012  . Long term (current) use of anticoagulants 03/26/2011  . Long term current use of anticoagulant therapy 07/23/2016  . Lumbago 03/26/2011  . 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 Osteoarthrosis, unspecified whether generalized or localized, unspecified site 11/24/2012  . Other and unspecified hyperlipidemia 03/26/2011  . Other malaise and fatigue 03/26/2011  . Pain in joint, forearm 11/24/2012  . Pain in joint, lower leg  06/15/13   left knee  . Paresthesia of left arm 07/05/2014  . Rectal bleed 06/15/13  . Reflux esophagitis 03/26/2011  . Sacroiliitis, not elsewhere classified ([place]) 05/28/2011  . Scoliosis (and kyphoscoliosis), idiopathic 05/28/2011  . 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 Tinnitus  12/21/2013  . 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 Unspecified essential hypertension 03/26/2011   Past Surgical History:  Procedure Laterality Date  . BASAL CELL CARCINOMA EXCISION Left 12/10/14   Dr. Jarome Matin  . COLONOSCOPY  08/12/2013   Dr. Hilarie Fredrickson adenomatous polyps  . CRYOTHERAPY Left 2009   hand  . PHOTODYNAMIC THERAPY  09/26/14   face and ear lobs  Dr. Jarome Matin  . SQUAMOUS CELL CARCINOMA EXCISION Left 2010   hand    reports that he has never smoked. He has never used smokeless tobacco. He reports that he does not drink alcohol or use drugs. Social History   Socioeconomic History  . Marital status: Married    Spouse name: Not on file  . Number of children: Not on file  . Years of education: Not on file  . Highest education level: Not on file  Occupational History  . 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 Financial resource strain: Not hard at all  . Food insecurity:    Worry: Never true    Inability: Never true  . Transportation needs:    Medical: No    Non-medical: No  Tobacco Use  . Smoking status: Never Smoker  . Smokeless tobacco: Never Used  Substance and Sexual Activity  . Alcohol use: No  . Drug use: No  . Sexual activity: Not on file  Lifestyle  . Physical activity:    Days per week: 0 days    Minutes per session: 0 min  . Stress: To some extent  Relationships  . Social connections:    Talks on phone: More than three times a week    Gets together: More than three times a week    Attends religious service: Never    Active member of club or organization: No    Attends meetings of clubs or organizations: Never    Relationship status: Married  . Intimate partner violence:    Fear of current or ex partner: No    Emotionally abused: No    Physically abused: No    Forced sexual activity: No  Other Topics Concern  . Not on file    Social History Narrative   [facility] since 11/2010   Married - [place]   Does not have Living Will   Never smoked   Alcohol none   Exercise: tennis, pool, table tennis 2-3 times a week    Functional Status Survey:    Family History  Problem Relation Age of Onset  . Cancer Mother        colon  . Heart disease Father        MI    Health Maintenance  Topic Date Due  . COLONOSCOPY  08/12/2018  . TETANUS/TDAP  09/04/2026  . INFLUENZA VACCINE  Completed  . PNA vac Low Risk Adult  Completed    Allergies  Allergen Reactions  . Ciprofloxacin Other (See Comments)    Patient doesn't like the way it makes him feel  . Zestril [Lisinopril]     unknown  . Sulfa Antibiotics  Rash    Outpatient Encounter Medications as of 12/15/2018  Medication Sig  . acetaminophen (TYLENOL) 500 MG tablet Take 500 mg by mouth daily as needed. Take it twice a day 3 days a week  . amLODipine (NORVASC) 2.5 MG tablet TAKE 1 TABLET DAILY TO CONTROL BLOOD PRESSURE  . AMLODIPINE BESYLATE PO Take 2.5 mg by mouth daily.  Marland Kitchen apixaban (ELIQUIS) 5 MG TABS tablet Take one tablet by mouth twice daily for anticoagulation  . finasteride (PROSCAR) 5 MG tablet Take one tablet by mouth once daily for urinating dribbling/prostate enlargement  . losartan (COZAAR) 100 MG tablet Take 1 tablet (100 mg total) by mouth daily.  . ranitidine (ZANTAC) 150 MG tablet TAKE 1 TABLET TWICE A DAY TO REDUCE STOMACH ACID  . simvastatin (ZOCOR) 10 MG tablet TAKE 1 TABLET DAILY TO LOWER CHOLESTEROL  . terazosin (HYTRIN) 2 MG capsule TAKE 1 CAPSULE DAILY  . TOPROL XL 50 MG 24 hr tablet TAKE 1 TABLET TWICE A DAY TO CONTROL BLOOD PRESSURE AND HEART RHYTHM  . phenazopyridine (PYRIDIUM) 100 MG tablet Take 1 tablet (100 mg total) by mouth 2 (two) times daily. (Patient not taking: Reported on 12/15/2018)   No facility-administered encounter medications on file as of 12/15/2018.     Review of Systems  Constitutional: Negative.    Musculoskeletal: Positive for arthralgias.       Right shoulder pain  All other systems reviewed and are negative.   Vitals:   12/15/18 1107  BP: (!) 140/92  Pulse: 66  Temp: 97.6 F (36.4 C)  TempSrc: Oral  SpO2: 96%  Weight: 195 lb 3.2 oz (88.5 kg)  Height: 5\' 7"  (1.702 m)   Body mass index is 30.57 kg/m. Physical Exam Constitutional:      Appearance: Normal appearance.  HENT:     Head: Normocephalic.     Right Ear: Tympanic membrane normal.     Left Ear: Tympanic membrane normal.     Nose: Nose normal.  Neck:     Musculoskeletal: Normal range of motion.  Cardiovascular:     Rate and Rhythm: Normal rate and regular rhythm.     Heart sounds: Normal heart sounds.     Comments: Patient has history of atrial fibrillation. EKG today shows normal sinus rhythm. Pulmonary:     Breath sounds: Normal breath sounds.  Musculoskeletal:     Comments: Right shoulder: Normal strength and range of motion.  Max tenderness seems to be over anterior bicipital tendon.  There is crepitance within the joint with range of motion suggesting degenerative changes I cannot really detect weakness or symptoms that would be consistent with rotator cuff tear  Neurological:     General: No focal deficit present.     Mental Status: He is alert and oriented to person, place, and time.     Comments: Patient had MMSE back in April.  Scored 29/30.  There is no evidence of dementia.     Labs reviewed: Basic Metabolic Panel: Recent Labs    01/01/18 0000 05/15/18 0000 08/26/18 0000  NA 140 142 142  K 3.9 4.0 4.0  CL 104 107 109  CO2 27 27 26   GLUCOSE 80 94 91  BUN 13 12 15   CREATININE 1.15* 1.12* 1.21*  CALCIUM 9.8 9.2 9.4   Liver Function Tests: Recent Labs    01/01/18 0000 05/15/18 0000 08/26/18 0000  AST 14 16 15   ALT 9 10 10   BILITOT 1.1 0.7 0.8  PROT 6.4 6.2 6.3  No results for input(s): LIPASE, AMYLASE in the last 8760 hours. No results for input(s): AMMONIA in the last  8760 hours. CBC: Recent Labs    01/01/18 0000 05/15/18 0000 08/26/18 0000  WBC 6.5 5.6 7.0  NEUTROABS 3,842 3,259  --   HGB 13.5 13.4 13.3  HCT 38.8 38.5 38.9  MCV 91.9 91.9 94.0  PLT 185 177 177   Cardiac Enzymes: No results for input(s): CKTOTAL, CKMB, CKMBINDEX, TROPONINI in the last 8760 hours. BNP: Invalid input(s): POCBNP No results found for: HGBA1C No results found for: TSH Lab Results  Component Value Date   VITAMINB12 324 08/26/2018   No results found for: FOLATE No results found for: IRON, TIBC, FERRITIN  Imaging and Procedures obtained prior to SNF admission: Dg Shoulder Right  Result Date: 06/26/2017 CLINICAL DATA:  Pain and decreased range of motion EXAM: RIGHT SHOULDER - 2+ VIEW COMPARISON:  None. FINDINGS: Frontal, Y scapular, and axillary images were obtained. No fracture or dislocation. There is extensive osteoarthritis in the glenohumeral joint with moderate osteoarthritic change in the acromioclavicular joint. No erosive change. Visualized right lung clear. IMPRESSION: Osteoarthritic change, more severe in the glenohumeral joint than acromioclavicular joint. No fracture or dislocation. Electronically Signed   By: Lowella Grip III M.D.   On: 06/26/2017 13:57    Assessment/Plan 1. Paroxysmal atrial fibrillation (HCC) Patient in normal sinus rhythm today.  Has not had any recent palpitations.  Does take Toprol for rate control.  2. Essential hypertension BP today is 140/92.  Takes combination of amlodipine Toprol and losartan  3. Benign neoplasm of colon, unspecified part of colon Mother died of colon cancer and he has been getting in colonoscopies every 5 years.  He brings in letter from GI specialist with recommendation that he needs no further colonoscopies but he is concerned given that family history.  I urged him to talk this over with his gastroenterologist  4. Mixed hyperlipidemia Patient takes simvastatin.  This appears to be well-tolerated  without side effects.  Most recent LDL was 74.  5. Elevated prostate specific antigen (PSA) This may have been related to enlarged prostate.  He is on finasteride to strength prostate as well as Terazosin for bladder emptying.  Most recent PSA was 1.5  6. Chronic right shoulder pain I believe the shoulder pain comes from degenerative changes.  Would like to try him on Voltaren gel.  He is to use this in combination with Tylenol that was previously recommended.   Family/ staff Communication:   Labs/tests ordered:  .smmsig

## 2018-12-25 ENCOUNTER — Other Ambulatory Visit: Payer: Self-pay | Admitting: *Deleted

## 2018-12-25 MED ORDER — TERAZOSIN HCL 2 MG PO CAPS: 2.0000 mg | ORAL_CAPSULE | Freq: Every day | ORAL | 1 refills | Status: DC

## 2019-02-11 DIAGNOSIS — D1801 Hemangioma of skin and subcutaneous tissue: Secondary | ICD-10-CM | POA: Diagnosis not present

## 2019-02-11 DIAGNOSIS — Z85828 Personal history of other malignant neoplasm of skin: Secondary | ICD-10-CM | POA: Diagnosis not present

## 2019-02-11 DIAGNOSIS — L570 Actinic keratosis: Secondary | ICD-10-CM | POA: Diagnosis not present

## 2019-02-11 DIAGNOSIS — L821 Other seborrheic keratosis: Secondary | ICD-10-CM | POA: Diagnosis not present

## 2019-02-11 DIAGNOSIS — D045 Carcinoma in situ of skin of trunk: Secondary | ICD-10-CM | POA: Diagnosis not present

## 2019-02-11 DIAGNOSIS — D485 Neoplasm of uncertain behavior of skin: Secondary | ICD-10-CM | POA: Diagnosis not present

## 2019-03-01 ENCOUNTER — Other Ambulatory Visit: Payer: Self-pay | Admitting: *Deleted

## 2019-03-01 MED ORDER — AMLODIPINE BESYLATE 2.5 MG PO TABS: 2.5000 mg | ORAL_TABLET | Freq: Every day | ORAL | 0 refills | Status: DC

## 2019-03-17 ENCOUNTER — Non-Acute Institutional Stay: Payer: Medicare Other | Admitting: Family

## 2019-03-17 ENCOUNTER — Encounter: Payer: Medicare Other | Admitting: Internal Medicine

## 2019-03-17 ENCOUNTER — Other Ambulatory Visit: Payer: Self-pay

## 2019-03-17 ENCOUNTER — Encounter: Payer: Self-pay | Admitting: Family

## 2019-03-17 VITALS — BP 130/62 | HR 61 | Temp 97.7°F | Resp 18 | Ht 67.0 in | Wt 193.0 lb

## 2019-03-17 DIAGNOSIS — R399 Unspecified symptoms and signs involving the genitourinary system: Secondary | ICD-10-CM

## 2019-03-17 NOTE — Progress Notes (Signed)
Location:  [place] of Service:  Clinic (12) Provider: Dinah Ngetich FNP-C  Virgie Dad, MD  Patient Care Team: Virgie Dad, MD as PCP - General (Internal Medicine) Jarome Matin, MD as Consulting Physician (Dermatology) [place], [facility] Wardell Honour, MD as Attending Physician [facility] Medicine)  Extended Emergency Contact Information Primary Emergency Contact: [facility] Address: [place], [place] [address] Johnnette Litter of [place] Phone: [phone] Relation: Daughter Secondary Emergency Contact: Filiberto Pinks Address: [address]          [place], [place] [address] [place] Phone: [phone] Relation: Spouse  Goals of care: Advanced Directive information Advanced Directives 03/17/2019  Does Patient Have a Medical Advance Directive? No  Type of Advance Directive -  Does patient want to make changes to medical advance directive? -  Copy of [place] in Chart? -  Would patient like information on creating a medical advance directive? Yes (MAU/Ambulatory/Procedural Areas - Information given)     Chief Complaint  Patient presents with  . Acute Visit    dysuria, abnormal urine odor     HPI:  Pt is a 85 y.o. male seen today at [facility] clinic for an acute visit for evaluation of worsening burning with urination and strong urine odor.He states has lower back pain though has had this for many years.He states has had urinary tract infections in the past with sepsis.He does not like drinking water.He denies any fever or chills.   Past Medical History:  Diagnosis Date  . Atrial fibrillation ([place]) 03/26/2011  . Basal cell carcinoma of other specified sites of skin 4/10/012  . Basal cell carcinoma of skin, site unspecified 03/25/2012  . Benign neoplasm of colon 03/25/2012   adenomatous polyps  . Broken arm 11/25/2017  . Colon polyp 2014  . Degeneration of  lumbar or lumbosacral intervertebral disc 09/24/2011  . Disturbance of skin sensation 01/28/2012  . Diverticulosis 2014  . Dysphagia, unspecified(787.20) 03/26/2011  . Elevated prostate specific antigen (PSA) 03/26/2011  . Exposure to unspecified radiation 03/26/2011  . Family history of colon cancer 07/16/2013  . GERD (gastroesophageal reflux disease) 07/16/2013  . Hearing deficit 12/21/2013  . Hemangioma of unspecified site 03/26/2011  . Hx of adenomatous colonic polyps 07/16/2013  . Hypertrophy of prostate with urinary obstruction and other lower urinary tract symptoms (LUTS) 03/26/2011  . Ingrowing nail 03/26/2011  . Internal hemorrhoids without mention of complication 01/21/82  . Keratosis, actinic 01/28/2012  . Long term (current) use of anticoagulants 03/26/2011  . Long term current use of anticoagulant therapy 07/23/2016  . Lumbago 03/26/2011  . 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 Osteoarthrosis, unspecified whether generalized or localized, unspecified site 11/24/2012  . Other and unspecified hyperlipidemia 03/26/2011  . Other malaise and fatigue 03/26/2011  . Pain in joint, forearm 11/24/2012  . Pain in joint, lower leg 06/15/13   left knee  . Paresthesia of left arm 07/05/2014  . Rectal bleed 06/15/13  . Reflux esophagitis 03/26/2011  . Sacroiliitis, not elsewhere classified ([place]) 05/28/2011  . Scoliosis (and kyphoscoliosis), idiopathic 05/28/2011  . 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 Tinnitus 12/21/2013  . 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 Unspecified essential hypertension 03/26/2011   Past Surgical History:  Procedure Laterality Date  .  BASAL CELL CARCINOMA EXCISION Left 12/10/14   Dr. Jarome Matin  . COLONOSCOPY  08/12/2013   Dr. Hilarie Fredrickson adenomatous polyps  . CRYOTHERAPY Left 2009   hand  . PHOTODYNAMIC THERAPY  09/26/14   face and ear lobs  Dr. Jarome Matin   . SQUAMOUS CELL CARCINOMA EXCISION Left 2010   hand    Allergies  Allergen Reactions  . Ciprofloxacin Other (See Comments)    Patient doesn't like the way it makes him feel  . Zestril [Lisinopril]     unknown  . Sulfa Antibiotics Rash    Outpatient Encounter Medications as of 03/17/2019  Medication Sig  . acetaminophen (TYLENOL) 500 MG tablet Take 500 mg by mouth daily as needed. Take it twice a day 3 days a week  . amLODipine (NORVASC) 2.5 MG tablet Take 1 tablet (2.5 mg total) by mouth daily.  Marland Kitchen apixaban (ELIQUIS) 5 MG TABS tablet Take one tablet by mouth twice daily for anticoagulation  . finasteride (PROSCAR) 5 MG tablet Take one tablet by mouth once daily for urinating dribbling/prostate enlargement  . losartan (COZAAR) 100 MG tablet Take 1 tablet (100 mg total) by mouth daily.  . simvastatin (ZOCOR) 10 MG tablet TAKE 1 TABLET DAILY TO LOWER CHOLESTEROL  . terazosin (HYTRIN) 2 MG capsule Take 1 capsule (2 mg total) by mouth daily.  . TOPROL XL 50 MG 24 hr tablet TAKE 1 TABLET TWICE A DAY TO CONTROL BLOOD PRESSURE AND HEART RHYTHM  . [DISCONTINUED] amLODipine (NORVASC) 2.5 MG tablet TAKE 1 TABLET DAILY TO CONTROL BLOOD PRESSURE  . [DISCONTINUED] diclofenac sodium (VOLTAREN) 1 % GEL Apply 2 g topically 4 (four) times daily.  . [DISCONTINUED] phenazopyridine (PYRIDIUM) 100 MG tablet Take 1 tablet (100 mg total) by mouth 2 (two) times daily. (Patient not taking: Reported on 12/15/2018)  . [DISCONTINUED] ranitidine (ZANTAC) 150 MG tablet TAKE 1 TABLET TWICE A DAY TO REDUCE STOMACH ACID   No facility-administered encounter medications on file as of 03/17/2019.     Review of Systems  Constitutional: Negative for activity change, appetite change, chills, fatigue and fever.  HENT: Positive for hearing loss. Negative for congestion, sinus pressure, sinus pain, sneezing, sore throat and tinnitus.        Chronic runny nose worst with eating hot soup.   Eyes: Positive for visual disturbance.  Negative for pain, discharge, redness and itching.       Wears eye glasses   Respiratory: Negative for cough, chest tightness, shortness of breath and wheezing.   Cardiovascular: Negative for chest pain, palpitations and leg swelling.  Gastrointestinal: Negative for abdominal distention, abdominal pain, constipation, diarrhea, nausea and vomiting.  Genitourinary: Negative for difficulty urinating, dysuria, flank pain, frequency and urgency.  Musculoskeletal: Positive for arthralgias. Negative for gait problem.       Chronic lower back pain   Skin: Negative for color change, pallor and rash.  Psychiatric/Behavioral: Negative for agitation, confusion and sleep disturbance. The patient is not nervous/anxious.     Immunization History  Administered Date(s) Administered  . Influenza Whole 09/15/2012, 09/16/2013  . Influenza, High Dose Seasonal PF 09/24/2017  . Influenza,inj,Quad PF,6+ Mos 09/17/2018  . Influenza-Unspecified 09/29/2014, 09/14/2015, 09/26/2016  . PPD Test 11/27/2010  . Pneumococcal Conjugate-13 02/08/2016  . Pneumococcal Polysaccharide-23 01/17/1996  . Td 02/14/2006, 09/04/2016  . Zoster Recombinat (Shingrix) 03/31/2018, 06/23/2018   Pertinent  Health Maintenance Due  Topic Date Due  . COLONOSCOPY  08/12/2018  . INFLUENZA VACCINE  07/17/2019  . PNA vac Low Risk Adult  Completed   Fall Risk  03/17/2019 12/15/2018 03/18/2018 10/01/2017 08/09/2016  Falls in the past year? 0 1 Yes No No  Comment - - - - [place] Telephone Survey: data to providers prior to load  Number falls in past yr: 0 0 1 - -  Injury with Fall? 0 1 Yes - -  Comment - broken wrist broken arm - -    Vitals:   03/17/19 1448  BP: 130/62  Pulse: 61  Resp: 18  Temp: 97.7 F (36.5 C)  TempSrc: Oral  SpO2: 96%  Weight: 193 lb (87.5 kg)  Height: 5\' 7"  (1.702 m)   Body mass index is 30.23 kg/m. Physical Exam Constitutional:      General: He is not in acute distress.    Appearance: He is obese. He is not  ill-appearing.  HENT:     Head: Normocephalic.     Right Ear: Tympanic membrane, ear canal and external ear normal. There is no impacted cerumen.     Left Ear: Tympanic membrane, ear canal and external ear normal. There is no impacted cerumen.     Nose: Nose normal. No congestion.     Mouth/Throat:     Mouth: Mucous membranes are moist.     Pharynx: Oropharynx is clear. No oropharyngeal exudate or posterior oropharyngeal erythema.  Eyes:     General: No scleral icterus.       Right eye: No discharge.        Left eye: No discharge.     Conjunctiva/sclera: Conjunctivae normal.     Pupils: Pupils are equal, round, and reactive to light.     Comments: Eye glasses in place   Neck:     Musculoskeletal: Normal range of motion.  Cardiovascular:     Rate and Rhythm: Normal rate and regular rhythm.     Pulses: Normal pulses.     Heart sounds: Normal heart sounds. No murmur. No friction rub. No gallop.   Pulmonary:     Effort: Pulmonary effort is normal. No respiratory distress.     Breath sounds: Normal breath sounds. No wheezing, rhonchi or rales.  Chest:     Chest wall: No tenderness.  Neurological:     Mental Status: He is alert.     Labs reviewed: Recent Labs    05/15/18 0000 08/26/18 0000  NA 142 142  K 4.0 4.0  CL 107 109  CO2 27 26  GLUCOSE 94 91  BUN 12 15  CREATININE 1.12* 1.21*  CALCIUM 9.2 9.4   Recent Labs    05/15/18 0000 08/26/18 0000  AST 16 15  ALT 10 10  BILITOT 0.7 0.8  PROT 6.2 6.3   Recent Labs    05/15/18 0000 08/26/18 0000  WBC 5.6 7.0  NEUTROABS 3,259  --   HGB 13.4 13.3  HCT 38.5 38.9  MCV 91.9 94.0  PLT 177 177   No results found for: TSH No results found for: HGBA1C Lab Results  Component Value Date   CHOL 136 08/26/2018   HDL 44 08/26/2018   LDLCALC 74 08/26/2018   TRIG 93 08/26/2018   CHOLHDL 3.1 08/26/2018    Significant Diagnostic Results in last 30 days:  No results found.  Assessment/Plan  Urinary tract infection  symptoms - Afebrile - Encouraged to increase fluid intake to 6-8 glasses of water per day. - Urine specimen for urine analysis,culture and sensitivity.will call you with results.  - Notify provider's office if symptoms worsen or running any fever >  100.5    Family/ staff Communication: Reviewed plan of care with patient.   Labs/tests ordered: Urine specimen for U/A and C/S   Follow up in 2 months with Dr.Gupta for medical management of chronic issues   Sandrea Hughs, NP

## 2019-03-17 NOTE — Patient Instructions (Addendum)
1.Notify provider's office at Tel# [phone] if you are running any fever > 100.5 or symptoms worsen  2. Will call you with urine specimen results.   3. Drink at least 6-8 glasses of water per day   Urinary Tract Infection, Adult A urinary tract infection (UTI) is an infection of any part of the urinary tract. The urinary tract includes:  The kidneys.  The ureters.  The bladder.  The urethra. These organs make, store, and get rid of pee (urine) in the body. What are the causes? This is caused by germs (bacteria) in your genital area. These germs grow and cause swelling (inflammation) of your urinary tract. What increases the risk? You are more likely to develop this condition if:  You have a small, thin tube (catheter) to drain pee.  You cannot control when you pee or poop (incontinence).  You are male, and: ? You use these methods to prevent pregnancy: ? A medicine that kills sperm (spermicide). ? A device that blocks sperm (diaphragm). ? You have low levels of a male hormone (estrogen). ? You are pregnant.  You have genes that add to your risk.  You are sexually active.  You take antibiotic medicines.  You have trouble peeing because of: ? A prostate that is bigger than normal, if you are male. ? A blockage in the part of your body that drains pee from the bladder (urethra). ? A kidney stone. ? A nerve condition that affects your bladder (neurogenic bladder). ? Not getting enough to drink. ? Not peeing often enough.  You have other conditions, such as: ? Diabetes. ? A weak disease-fighting system (immune system). ? Sickle cell disease. ? Gout. ? Injury of the spine. What are the signs or symptoms? Symptoms of this condition include:  Needing to pee right away (urgently).  Peeing often.  Peeing small amounts often.  Pain or burning when peeing.  Blood in the pee.  Pee that smells bad or not like normal.  Trouble peeing.  Pee that is  cloudy.  Fluid coming from the vagina, if you are male.  Pain in the belly or lower back. Other symptoms include:  Throwing up (vomiting).  No urge to eat.  Feeling mixed up (confused).  Being tired and grouchy (irritable).  A fever.  Watery poop (diarrhea). How is this treated? This condition may be treated with:  Antibiotic medicine.  Other medicines.  Drinking enough water. Follow these instructions at home:  Medicines  Take over-the-counter and prescription medicines only as told by your doctor.  If you were prescribed an antibiotic medicine, take it as told by your doctor. Do not stop taking it even if you start to feel better. General instructions  Make sure you: ? Pee until your bladder is empty. ? Do not hold pee for a long time. ? Empty your bladder after sex. ? Wipe from front to back after pooping if you are a male. Use each tissue one time when you wipe.  Drink enough fluid to keep your pee pale yellow.  Keep all follow-up visits as told by your doctor. This is important. Contact a doctor if:  You do not get better after 1-2 days.  Your symptoms go away and then come back. Get help right away if:  You have very bad back pain.  You have very bad pain in your lower belly.  You have a fever.  You are sick to your stomach (nauseous).  You are throwing up. Summary  A  urinary tract infection (UTI) is an infection of any part of the urinary tract.  This condition is caused by germs in your genital area.  There are many risk factors for a UTI. These include having a small, thin tube to drain pee and not being able to control when you pee or poop.  Treatment includes antibiotic medicines for germs.  Drink enough fluid to keep your pee pale yellow. This information is not intended to replace advice given to you by your health care provider. Make sure you discuss any questions you have with your health care provider. Document Released:  05/20/2008 Document Revised: 06/11/2018 Document Reviewed: 06/11/2018 Elsevier Interactive Patient Education  2019 [employer].

## 2019-03-18 ENCOUNTER — Other Ambulatory Visit: Payer: Self-pay

## 2019-03-19 DIAGNOSIS — R399 Unspecified symptoms and signs involving the genitourinary system: Secondary | ICD-10-CM | POA: Diagnosis not present

## 2019-03-22 ENCOUNTER — Other Ambulatory Visit: Payer: Self-pay

## 2019-03-22 ENCOUNTER — Other Ambulatory Visit: Payer: Medicare Other

## 2019-03-22 DIAGNOSIS — R399 Unspecified symptoms and signs involving the genitourinary system: Secondary | ICD-10-CM

## 2019-03-23 LAB — URINALYSIS, ROUTINE W REFLEX MICROSCOPIC
Bilirubin Urine: NEGATIVE
Glucose, UA: NEGATIVE
Ketones, ur: NEGATIVE
Nitrite: NEGATIVE
Protein, ur: NEGATIVE
Specific Gravity, Urine: 1.022 (ref 1.001–1.03)
pH: <=5 (ref 5.0–8.0)

## 2019-03-23 LAB — URINE CULTURE
MICRO NUMBER:: 377204
SPECIMEN QUALITY:: ADEQUATE

## 2019-03-23 LAB — MICROSCOPIC MESSAGE

## 2019-03-24 ENCOUNTER — Telehealth: Payer: Self-pay

## 2019-03-24 MED ORDER — SACCHAROMYCES BOULARDII 250 MG PO CAPS: 250.0000 mg | ORAL_CAPSULE | Freq: Two times a day (BID) | ORAL | 0 refills | Status: AC

## 2019-03-24 MED ORDER — AMOXICILLIN-POT CLAVULANATE 875-125 MG PO TABS: 1.0000 | ORAL_TABLET | Freq: Two times a day (BID) | ORAL | 0 refills | Status: AC

## 2019-03-24 NOTE — Telephone Encounter (Signed)
Patient aware of urine results as well as medication sent to the pharmacy.

## 2019-03-24 NOTE — Telephone Encounter (Signed)
-----   Message from Sandrea Hughs, NP sent at 03/24/2019  3:57 PM EDT ----- - Urine culture and U/A positive for urinary tract infections. - Start on Augmentin 875/125 mg tablet Take one by mouth twice daily x 10 days  Along with Florastor (Probiotic ) 250 mg capsule one by mouth twice daily x 10 days  - Increase fluid intake

## 2019-03-25 ENCOUNTER — Other Ambulatory Visit: Payer: Self-pay

## 2019-03-25 MED ORDER — SIMVASTATIN 10 MG PO TABS: ORAL_TABLET | ORAL | 3 refills | Status: DC

## 2019-05-05 ENCOUNTER — Other Ambulatory Visit: Payer: Self-pay | Admitting: Family Medicine

## 2019-05-07 ENCOUNTER — Other Ambulatory Visit: Payer: Self-pay

## 2019-05-07 MED ORDER — LOSARTAN POTASSIUM 100 MG PO TABS: 100.0000 mg | ORAL_TABLET | Freq: Every day | ORAL | 1 refills | Status: DC

## 2019-05-07 MED ORDER — FINASTERIDE 5 MG PO TABS: ORAL_TABLET | ORAL | 1 refills | Status: DC

## 2019-05-07 NOTE — Telephone Encounter (Signed)
High risk warning populated when attempting to refill medication  RX request sent to Virgie Dad, MD for review and approval if necessary

## 2019-05-19 ENCOUNTER — Other Ambulatory Visit: Payer: Self-pay

## 2019-05-19 ENCOUNTER — Non-Acute Institutional Stay: Payer: Medicare Other | Admitting: Internal Medicine

## 2019-05-19 ENCOUNTER — Encounter: Payer: Self-pay | Admitting: Internal Medicine

## 2019-05-19 VITALS — BP 128/48 | HR 59 | Temp 98.1°F | Ht 67.0 in | Wt 186.2 lb

## 2019-05-19 DIAGNOSIS — I10 Essential (primary) hypertension: Secondary | ICD-10-CM

## 2019-05-19 DIAGNOSIS — R350 Frequency of micturition: Secondary | ICD-10-CM

## 2019-05-19 DIAGNOSIS — N401 Enlarged prostate with lower urinary tract symptoms: Secondary | ICD-10-CM

## 2019-05-19 DIAGNOSIS — D126 Benign neoplasm of colon, unspecified: Secondary | ICD-10-CM

## 2019-05-19 DIAGNOSIS — E782 Mixed hyperlipidemia: Secondary | ICD-10-CM

## 2019-05-19 DIAGNOSIS — I480 Paroxysmal atrial fibrillation: Secondary | ICD-10-CM

## 2019-05-19 DIAGNOSIS — K219 Gastro-esophageal reflux disease without esophagitis: Secondary | ICD-10-CM

## 2019-05-19 NOTE — Progress Notes (Signed)
Location:  [place] of Service:  Clinic (12)  Provider:   Code Status:  Goals of Care:  Advanced Directives 05/19/2019  Does Patient Have a Medical Advance Directive? No  Type of Advance Directive -  Does patient want to make changes to medical advance directive? -  Copy of [place] in Chart? -  Would patient like information on creating a medical advance directive? No - Patient declined     Chief Complaint  Patient presents with  . Medical Management of Chronic Issues    2 month follow up, dysuria, pharmacy is temporarily out of losartan   . Health Maintenance    colonscopy    HPI: Patient is a 85 y.o. male seen today for medical management of chronic diseases.   He has h/o Atrial Fibrillation on chronic Eliquis, BPH, h/o Urosepsis, Hypertension, Colon Polyps  Patient had dysuria few months ago and was diagnosed with UTI he was treated with Augmentin for 10 days.  Patient states he continues to have symptoms but they are not that bad.  He has increased his intake of fluid.  He is on Proscar and Hytrin due to BPH.  Has no active symptoms like frequency or hesitancy.  He has never seen a Urologist before  He also has a history of colonic polyps.  He had polypectomy done 5 years ago.  He wanted to know if he should follow-up with GI.  They had told him he does not need another colonoscopy. He said   his mom died at age of 56 due to colon cancer  He also wanted a replacement for losartan and is not available at pharmacy  He lives with his wife in an independent apartment.  His closest children lives in [place].  His wife has early dementia.  He still drives.  Does not use cane or walker.  He plays tennis for exercise   Past Medical History:  Diagnosis Date  . Atrial fibrillation ([place]) 03/26/2011  . Basal cell carcinoma of other specified sites of skin 4/10/012  . Basal cell carcinoma of skin, site unspecified 03/25/2012  . Benign neoplasm  of colon 03/25/2012   adenomatous polyps  . Broken arm 11/25/2017  . Colon polyp 2014  . Degeneration of lumbar or lumbosacral intervertebral disc 09/24/2011  . Disturbance of skin sensation 01/28/2012  . Diverticulosis 2014  . Dysphagia, unspecified(787.20) 03/26/2011  . Elevated prostate specific antigen (PSA) 03/26/2011  . Exposure to unspecified radiation 03/26/2011  . Family history of colon cancer 07/16/2013  . GERD (gastroesophageal reflux disease) 07/16/2013  . Hearing deficit 12/21/2013  . Hemangioma of unspecified site 03/26/2011  . Hx of adenomatous colonic polyps 07/16/2013  . Hypertrophy of prostate with urinary obstruction and other lower urinary tract symptoms (LUTS) 03/26/2011  . Ingrowing nail 03/26/2011  . Internal hemorrhoids without mention of complication 03/16/65  . Keratosis, actinic 01/28/2012  . Long term (current) use of anticoagulants 03/26/2011  . Long term current use of anticoagulant therapy 07/23/2016  . Lumbago 03/26/2011  . 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 Osteoarthrosis, unspecified whether generalized or localized, unspecified site 11/24/2012  . Other and unspecified hyperlipidemia 03/26/2011  . Other malaise and fatigue 03/26/2011  . Pain in joint, forearm 11/24/2012  . Pain in joint, lower leg 06/15/13   left knee  . Paresthesia of left arm 07/05/2014  . Rectal bleed 06/15/13  . Reflux esophagitis 03/26/2011  . Sacroiliitis, not elsewhere  classified ([place]) 05/28/2011  . Scoliosis (and kyphoscoliosis), idiopathic 05/28/2011  . 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 Tinnitus 12/21/2013  . 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 Unspecified essential hypertension 03/26/2011    Past Surgical History:  Procedure Laterality Date  . BASAL CELL CARCINOMA EXCISION Left 12/10/14   Dr. Jarome Matin  . COLONOSCOPY  08/12/2013   Dr. Hilarie Fredrickson adenomatous  polyps  . CRYOTHERAPY Left 2009   hand  . PHOTODYNAMIC THERAPY  09/26/14   face and ear lobs  Dr. Jarome Matin  . SQUAMOUS CELL CARCINOMA EXCISION Left 2010   hand    Allergies  Allergen Reactions  . Ciprofloxacin Other (See Comments)    Patient doesn't like the way it makes him feel  . Zestril [Lisinopril]     unknown  . Sulfa Antibiotics Rash    Outpatient Encounter Medications as of 05/19/2019  Medication Sig  . acetaminophen (TYLENOL) 500 MG tablet Take 500 mg by mouth daily as needed. Take it twice a day 3 days a week  . amLODipine (NORVASC) 2.5 MG tablet Take 1 tablet (2.5 mg total) by mouth daily.  Marland Kitchen apixaban (ELIQUIS) 5 MG TABS tablet Take one tablet by mouth twice daily for anticoagulation  . finasteride (PROSCAR) 5 MG tablet Take one tablet by mouth once daily for urinating dribbling/prostate enlargement  . losartan (COZAAR) 100 MG tablet Take 1 tablet (100 mg total) by mouth daily.  . simvastatin (ZOCOR) 10 MG tablet Take 1 tablet daily to lower cholesterol  . terazosin (HYTRIN) 2 MG capsule Take 1 capsule (2 mg total) by mouth daily.  . TOPROL XL 50 MG 24 hr tablet TAKE 1 TABLET TWICE A DAY TO CONTROL BLOOD PRESSURE AND HEART RHYTHM   No facility-administered encounter medications on file as of 05/19/2019.     Review of Systems:  Review of Systems  Review of Systems  Constitutional: Negative for activity change, appetite change, chills, diaphoresis, fatigue and fever.  HENT: Negative for mouth sores, postnasal drip, rhinorrhea, sinus pain and sore throat.   Respiratory: Negative for apnea, cough, chest tightness, shortness of breath and wheezing.   Cardiovascular: Negative for chest pain, palpitations and leg swelling.  Gastrointestinal: Negative for abdominal distention, abdominal pain, constipation, diarrhea, nausea and vomiting.  Genitourinary: Negative for  frequency.  Musculoskeletal: Negative for arthralgias, joint swelling and myalgias.  Skin: Negative for rash.   Neurological: Negative for dizziness, syncope, weakness, light-headedness and numbness.  Psychiatric/Behavioral: Negative for behavioral problems, confusion and sleep disturbance.     Health Maintenance  Topic Date Due  . COLONOSCOPY  08/12/2018  . INFLUENZA VACCINE  07/17/2019  . TETANUS/TDAP  09/04/2026  . PNA vac Low Risk Adult  Completed    Physical Exam: Vitals:   05/19/19 1337  BP: (!) 128/48  Pulse: (!) 59  Temp: 98.1 F (36.7 C)  SpO2: 97%  Weight: 186 lb 3.2 oz (84.5 kg)  Height: 5\' 7"  (1.702 m)   Body mass index is 29.16 kg/m. Physical Exam  Constitutional: Oriented to person, place, and time. Well-developed and well-nourished.  HENT:  Head: Normocephalic.  Mouth/Throat: Oropharynx is clear and moist.  Eyes: Pupils are equal, round, and reactive to light.  Neck: Neck supple.  Cardiovascular: Normal rate and normal heart sounds.  No murmur heard. Pulmonary/Chest: Effort normal and breath sounds normal. No respiratory distress. No wheezes. She has no rales.  Abdominal: Soft. Bowel sounds are normal.  No distension. There is no tenderness. There is no rebound.  Musculoskeletal: No edema. Pain In his Right Shoulder due to Playing Tennis Lymphadenopathy: none Neurological: Alert and oriented to person, place, and time. Got up and walked with no issues Skin: Skin is warm and dry.  Psychiatric: Normal mood and affect. Behavior is normal. Thought content normal.    Labs reviewed: Basic Metabolic Panel: Recent Labs    08/26/18 0000  NA 142  K 4.0  CL 109  CO2 26  GLUCOSE 91  BUN 15  CREATININE 1.21*  CALCIUM 9.4   Liver Function Tests: Recent Labs    08/26/18 0000  AST 15  ALT 10  BILITOT 0.8  PROT 6.3   No results for input(s): LIPASE, AMYLASE in the last 8760 hours. No results for input(s): AMMONIA in the last 8760 hours. CBC: Recent Labs    08/26/18 0000  WBC 7.0  HGB 13.3  HCT 38.9  MCV 94.0  PLT 177   Lipid Panel: Recent Labs     08/26/18 0000  CHOL 136  HDL 44  LDLCALC 74  TRIG 93  CHOLHDL 3.1   No results found for: HGBA1C  Procedures since last visit: No results found.  Assessment/Plan  Essential hypertension BP controlled  On Norvasc and Toprol His pharmacy wanted a refill replacement for [place] and changed it to Valsartan to 160 mg QD   Paroxysmal atrial fibrillation  On Eliquis for Past few years Has never seen Cardiologist before He is going to let me know if he wants to follow with someone His EF in 2018 was 60 %  Benign neoplasm of colon, unspecified part of colon He has family history of Colon Cancer and personal history of  Wanted to know if he he should get repeat colonoscopy. GI told him that he does not need due to his age and I told him that I agree with them.  At this time we would not pursue colonoscopy unless patient has any changes in his bowel habits.  Mixed hyperlipidemia On Statin Repeat Lipid profile  Benign prostatic hyperplasia  He is doing well on Hytrin and Proscar If he has symptoms of dysuria do not would not improve in 4 weeks will repeat a UA and possible refer him to a urologist.     Labs/tests ordered:  @ORDERS @ Next appt:  09/13/2019   Total time spent in this patient care encounter was  40_  minutes; greater than 50% of the visit spent counseling patient and staff, reviewing records , Labs and coordinating care for problems addressed at this encounter.

## 2019-05-31 ENCOUNTER — Other Ambulatory Visit: Payer: Self-pay | Admitting: Internal Medicine

## 2019-06-16 DIAGNOSIS — E782 Mixed hyperlipidemia: Secondary | ICD-10-CM | POA: Diagnosis not present

## 2019-06-16 DIAGNOSIS — I10 Essential (primary) hypertension: Secondary | ICD-10-CM | POA: Diagnosis not present

## 2019-06-17 ENCOUNTER — Other Ambulatory Visit: Payer: Medicare Other

## 2019-06-18 LAB — CBC WITH DIFFERENTIAL/PLATELET
Absolute Monocytes: 435 cells/uL (ref 200–950)
Basophils Absolute: 41 cells/uL (ref 0–200)
Basophils Relative: 0.7 %
Eosinophils Absolute: 244 cells/uL (ref 15–500)
Eosinophils Relative: 4.2 %
HCT: 39.2 % (ref 38.5–50.0)
Hemoglobin: 13.5 g/dL (ref 13.2–17.1)
Lymphs Abs: 1839 cells/uL (ref 850–3900)
MCH: 33.1 pg — ABNORMAL HIGH (ref 27.0–33.0)
MCHC: 34.4 g/dL (ref 32.0–36.0)
MCV: 96.1 fL (ref 80.0–100.0)
MPV: 9.4 fL (ref 7.5–12.5)
Monocytes Relative: 7.5 %
Neutro Abs: 3242 cells/uL (ref 1500–7800)
Neutrophils Relative %: 55.9 %
Platelets: 185 10*3/uL (ref 140–400)
RBC: 4.08 10*6/uL — ABNORMAL LOW (ref 4.20–5.80)
RDW: 13.1 % (ref 11.0–15.0)
Total Lymphocyte: 31.7 %
WBC: 5.8 10*3/uL (ref 3.8–10.8)

## 2019-06-18 LAB — LIPID PANEL
Cholesterol: 118 mg/dL (ref <200)
HDL: 40 mg/dL (ref >=40)
LDL Cholesterol (Calc): 58 mg/dL (calc)
Non-HDL Cholesterol (Calc): 78 mg/dL (calc) (ref <130)
Total CHOL/HDL Ratio: 3 (calc) (ref <5.0)
Triglycerides: 118 mg/dL (ref <150)

## 2019-06-18 LAB — COMPLETE METABOLIC PANEL WITH GFR
AG Ratio: 1.6 (calc) (ref 1.0–2.5)
ALT: 7 U/L — ABNORMAL LOW (ref 9–46)
AST: 14 U/L (ref 10–35)
Albumin: 3.8 g/dL (ref 3.6–5.1)
Alkaline phosphatase (APISO): 74 U/L (ref 35–144)
BUN/Creatinine Ratio: 11 (calc) (ref 6–22)
BUN: 14 mg/dL (ref 7–25)
CO2: 24 mmol/L (ref 20–32)
Calcium: 9.2 mg/dL (ref 8.6–10.3)
Chloride: 109 mmol/L (ref 98–110)
Creat: 1.22 mg/dL — ABNORMAL HIGH (ref 0.70–1.11)
GFR, Est African American: 61 mL/min/{1.73_m2} (ref >=60)
GFR, Est Non African American: 53 mL/min/{1.73_m2} — ABNORMAL LOW (ref >=60)
Globulin: 2.4 g/dL (calc) (ref 1.9–3.7)
Glucose, Bld: 89 mg/dL (ref 65–99)
Potassium: 4 mmol/L (ref 3.5–5.3)
Sodium: 143 mmol/L (ref 135–146)
Total Bilirubin: 0.5 mg/dL (ref 0.2–1.2)
Total Protein: 6.2 g/dL (ref 6.1–8.1)

## 2019-06-18 LAB — TSH: TSH: 6.3 mIU/L — ABNORMAL HIGH (ref 0.40–4.50)

## 2019-06-19 LAB — URINALYSIS
Bilirubin Urine: NEGATIVE
Glucose, UA: NEGATIVE
Hgb urine dipstick: NEGATIVE
Ketones, ur: NEGATIVE
Nitrite: NEGATIVE
Protein, ur: NEGATIVE
Specific Gravity, Urine: 1.012 (ref 1.001–1.03)
pH: <=5 (ref 5.0–8.0)

## 2019-06-19 LAB — URINE CULTURE
MICRO NUMBER:: 630375
SPECIMEN QUALITY:: ADEQUATE

## 2019-06-19 LAB — EXTRA URINE SPECIMEN

## 2019-06-23 ENCOUNTER — Other Ambulatory Visit: Payer: Self-pay | Admitting: Internal Medicine

## 2019-08-12 DIAGNOSIS — D1801 Hemangioma of skin and subcutaneous tissue: Secondary | ICD-10-CM | POA: Diagnosis not present

## 2019-08-12 DIAGNOSIS — Z85828 Personal history of other malignant neoplasm of skin: Secondary | ICD-10-CM | POA: Diagnosis not present

## 2019-08-12 DIAGNOSIS — L570 Actinic keratosis: Secondary | ICD-10-CM | POA: Diagnosis not present

## 2019-08-12 DIAGNOSIS — L821 Other seborrheic keratosis: Secondary | ICD-10-CM | POA: Diagnosis not present

## 2019-08-12 DIAGNOSIS — L820 Inflamed seborrheic keratosis: Secondary | ICD-10-CM | POA: Diagnosis not present

## 2019-09-10 DIAGNOSIS — L570 Actinic keratosis: Secondary | ICD-10-CM | POA: Diagnosis not present

## 2019-09-13 ENCOUNTER — Other Ambulatory Visit: Payer: Self-pay

## 2019-09-15 ENCOUNTER — Encounter: Payer: Self-pay | Admitting: Internal Medicine

## 2019-09-24 ENCOUNTER — Other Ambulatory Visit: Payer: Self-pay | Admitting: Nurse Practitioner

## 2019-11-03 ENCOUNTER — Other Ambulatory Visit: Payer: Self-pay | Admitting: Internal Medicine

## 2019-11-09 ENCOUNTER — Other Ambulatory Visit: Payer: Self-pay | Admitting: *Deleted

## 2019-11-09 MED ORDER — METOPROLOL SUCCINATE ER 50 MG PO TB24: ORAL_TABLET | ORAL | 3 refills | Status: DC

## 2019-11-09 NOTE — Telephone Encounter (Signed)
[facility]

## 2019-11-17 ENCOUNTER — Encounter: Payer: Self-pay | Admitting: Internal Medicine

## 2019-11-17 ENCOUNTER — Other Ambulatory Visit: Payer: Self-pay

## 2019-11-17 ENCOUNTER — Non-Acute Institutional Stay: Payer: Medicare Other | Admitting: Internal Medicine

## 2019-11-17 VITALS — BP 106/70 | HR 68 | Temp 97.7°F | Ht 67.0 in | Wt 180.6 lb

## 2019-11-17 DIAGNOSIS — M19011 Primary osteoarthritis, right shoulder: Secondary | ICD-10-CM | POA: Diagnosis not present

## 2019-11-17 DIAGNOSIS — N401 Enlarged prostate with lower urinary tract symptoms: Secondary | ICD-10-CM | POA: Diagnosis not present

## 2019-11-17 DIAGNOSIS — R350 Frequency of micturition: Secondary | ICD-10-CM | POA: Diagnosis not present

## 2019-11-17 DIAGNOSIS — R5383 Other fatigue: Secondary | ICD-10-CM | POA: Diagnosis not present

## 2019-11-17 DIAGNOSIS — E782 Mixed hyperlipidemia: Secondary | ICD-10-CM | POA: Diagnosis not present

## 2019-11-17 DIAGNOSIS — I480 Paroxysmal atrial fibrillation: Secondary | ICD-10-CM | POA: Diagnosis not present

## 2019-11-17 DIAGNOSIS — I10 Essential (primary) hypertension: Secondary | ICD-10-CM | POA: Diagnosis not present

## 2019-11-17 DIAGNOSIS — N1831 Chronic kidney disease, stage 3a: Secondary | ICD-10-CM

## 2019-11-17 MED ORDER — MELOXICAM 15 MG PO TABS: 15.0000 mg | ORAL_TABLET | Freq: Every day | ORAL | 0 refills | Status: DC

## 2019-11-17 MED ORDER — MELOXICAM 7.5 MG PO TABS: 15.0000 mg | ORAL_TABLET | Freq: Every day | ORAL | 0 refills | Status: DC

## 2019-11-17 NOTE — Progress Notes (Signed)
Location:  [place] of Service:  Clinic (12)  Provider:   Code Status:  Goals of Care:  Advanced Directives 05/19/2019  Does Patient Have a Medical Advance Directive? No  Type of Advance Directive -  Does patient want to make changes to medical advance directive? -  Copy of [place] in Chart? -  Would patient like information on creating a medical advance directive? No - Patient declined     Chief Complaint  Patient presents with  . Medical Management of Chronic Issues    6 month follow up patient c/o of right shoulder - osteoarthritis gradually getting worse, very low level energy started taking vitamins but doesnt seem to help     HPI: Patient is a 85 y.o. male seen today for medical management of chronic diseases.    History of atrial fibrillation On chronic Eliquis and rate controlled on Toprol Has not seen a cardiologist since he has moved here in [place] BPH Symptoms controlled on Proscar and Hytrin Hypertension On low-dose of Norvasc and Toprol Hyperlipidemia Tolerating simvastatin Right shoulder pain Had seen orthopedics before.  His x-ray showed severe arthritic changes in the glenohumeral joint He had a steroid injection but he states it made it worse Since he has quit playing tennis due to Covid the pain has come back. General fatigue Patient states that he just feels tired all the time Denies any shortness of breath or chest pain.  More like fatigue He did say that he has been little depressed as his wife has dementia and he has to be with her all the time."  It has made it hard for him to socialize with his friends.  His appetite is fair.  He is sleeping well and has not lost any weight  He lives with his wife in an independent apartment.  His closest children lives in [place].  His wife has early dementia.  He still drives.  Does not use cane or walker Past Medical History:  Diagnosis Date  . Atrial fibrillation  ([place]) 03/26/2011  . Basal cell carcinoma of other specified sites of skin 4/10/012  . Basal cell carcinoma of skin, site unspecified 03/25/2012  . Benign neoplasm of colon 03/25/2012   adenomatous polyps  . Broken arm 11/25/2017  . Colon polyp 2014  . Degeneration of lumbar or lumbosacral intervertebral disc 09/24/2011  . Disturbance of skin sensation 01/28/2012  . Diverticulosis 2014  . Dysphagia, unspecified(787.20) 03/26/2011  . Elevated prostate specific antigen (PSA) 03/26/2011  . Exposure to unspecified radiation 03/26/2011  . Family history of colon cancer 07/16/2013  . GERD (gastroesophageal reflux disease) 07/16/2013  . Hearing deficit 12/21/2013  . Hemangioma of unspecified site 03/26/2011  . Hx of adenomatous colonic polyps 07/16/2013  . Hypertrophy of prostate with urinary obstruction and other lower urinary tract symptoms (LUTS) 03/26/2011  . Ingrowing nail 03/26/2011  . Internal hemorrhoids without mention of complication 99991111  . Keratosis, actinic 01/28/2012  . Long term (current) use of anticoagulants 03/26/2011  . Long term current use of anticoagulant therapy 07/23/2016  . Lumbago 03/26/2011  . 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 Osteoarthrosis, unspecified whether generalized or localized, unspecified site 11/24/2012  . Other and unspecified hyperlipidemia 03/26/2011  . Other malaise and fatigue 03/26/2011  . Pain in joint, forearm 11/24/2012  . Pain in joint, lower leg 06/15/13   left knee  . Paresthesia of left arm 07/05/2014  . Rectal bleed  06/15/13  . Reflux esophagitis 03/26/2011  . Sacroiliitis, not elsewhere classified ([place]) 05/28/2011  . Scoliosis (and kyphoscoliosis), idiopathic 05/28/2011  . 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 Tinnitus 12/21/2013  . 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 Unspecified essential hypertension 03/26/2011    Past  Surgical History:  Procedure Laterality Date  . BASAL CELL CARCINOMA EXCISION Left 12/10/14   Dr. Jarome Matin  . COLONOSCOPY  08/12/2013   Dr. Hilarie Fredrickson adenomatous polyps  . CRYOTHERAPY Left 2009   hand  . PHOTODYNAMIC THERAPY  09/26/14   face and ear lobs  Dr. Jarome Matin  . SQUAMOUS CELL CARCINOMA EXCISION Left 2010   hand    Allergies  Allergen Reactions  . Ciprofloxacin Other (See Comments)    Patient doesn't like the way it makes him feel  . Zestril [Lisinopril]     unknown  . Sulfa Antibiotics Rash    Outpatient Encounter Medications as of 11/17/2019  Medication Sig  . acetaminophen (TYLENOL) 500 MG tablet Take 500 mg by mouth daily as needed. Take it twice a day 3 days a week  . amLODipine (NORVASC) 2.5 MG tablet TAKE 1 TABLET DAILY  . ELIQUIS 5 MG TABS tablet TAKE 1 TABLET TWICE A DAY FOR ANTICOAGULATION  . famotidine (PEPCID) 20 MG tablet Take 20 mg by mouth as needed for heartburn or indigestion.  . finasteride (PROSCAR) 5 MG tablet TAKE 1 TABLET DAILY FOR URINATING DRIBBLING/PROSTATE ENLARGEMENT  . metoprolol succinate (TOPROL XL) 50 MG 24 hr tablet Take one tablet by mouth twice daily to control blood pressure and heart rhythm. Take with or immediately following a meal.  . simvastatin (ZOCOR) 10 MG tablet Take 1 tablet daily to lower cholesterol  . terazosin (HYTRIN) 2 MG capsule TAKE 1 CAPSULE DAILY  . valsartan (DIOVAN) 160 MG tablet Take 160 mg by mouth daily.  . [DISCONTINUED] losartan (COZAAR) 100 MG tablet Take 1 tablet (100 mg total) by mouth daily.   No facility-administered encounter medications on file as of 11/17/2019.     Review of Systems:  Review of Systems  Constitutional: Negative for activity change, appetite change, chills, diaphoresis, fatigue and fever.  HENT: Negative for mouth sores, postnasal drip, rhinorrhea, sinus pain and sore throat.   Respiratory: Negative for apnea, cough, chest tightness, shortness of breath and wheezing.    Cardiovascular: Negative for chest pain, palpitations and leg swelling.  Gastrointestinal: Negative for abdominal distention, abdominal pain, constipation, diarrhea, nausea and vomiting.  Genitourinary: Negative for dysuria and frequency.  Musculoskeletal: Negative for arthralgias, joint swelling and myalgias.  Skin: Negative for rash.  Neurological: Negative for dizziness, syncope, weakness, light-headedness and numbness.  Psychiatric/Behavioral: Negative for behavioral problems, confusion and sleep disturbance.      Health Maintenance  Topic Date Due  . COLONOSCOPY  05/18/2020 (Originally 08/12/2018)  . TETANUS/TDAP  09/04/2026  . INFLUENZA VACCINE  Completed  . PNA vac Low Risk Adult  Completed    Physical Exam: Vitals:   11/17/19 1410  BP: 106/70  Pulse: 68  Temp: 97.7 F (36.5 C)  TempSrc: Temporal  SpO2: 94%  Weight: 180 lb 9.6 oz (81.9 kg)  Height: 5\' 7"  (1.702 m)   Body mass index is 28.29 kg/m. Physical Exam  Constitutional: Oriented to person, place, and time. Well-developed and well-nourished.  HENT:  Head: Normocephalic.  Mouth/Throat: Oropharynx is clear and moist.  Eyes: Pupils are equal, round, and reactive to light.  Neck: Neck supple.  Cardiovascular: Irregular  and normal heart sounds.  No murmur heard. Pulmonary/Chest: Effort normal and breath sounds normal. No respiratory distress. No wheezes. She has no rales.  Abdominal: Soft. Bowel sounds are normal. No distension. There is no tenderness. There is no rebound.  Musculoskeletal: No edema.  Right Shoulder had Pain when moving it above the Head even with Passive Motion Lymphadenopathy: none Neurological: Alert and oriented to person, place, and time. No Focal Deficits Gait was stable. Little Unsteady when he made turn Skin: Skin is warm and dry.  Psychiatric: Normal mood and affect. Behavior is normal. Thought content normal.    Labs reviewed: Basic Metabolic Panel: Recent Labs    06/16/19  1358  NA 143  K 4.0  CL 109  CO2 24  GLUCOSE 89  BUN 14  CREATININE 1.22*  CALCIUM 9.2  TSH 6.30*   Liver Function Tests: Recent Labs    06/16/19 1358  AST 14  ALT 7*  BILITOT 0.5  PROT 6.2   No results for input(s): LIPASE, AMYLASE in the last 8760 hours. No results for input(s): AMMONIA in the last 8760 hours. CBC: Recent Labs    06/16/19 1358  WBC 5.8  NEUTROABS 3,242  HGB 13.5  HCT 39.2  MCV 96.1  PLT 185   Lipid Panel: Recent Labs    06/16/19 1358  CHOL 118  HDL 40  LDLCALC 58  TRIG 118  CHOLHDL 3.0   No results found for: HGBA1C  Procedures since last visit: No results found.  Assessment/Plan  Osteoarthritis of right shoulder, unspecified osteoarthritis type Previous Xrays showed Arthritis He does not want to see Ortho right npw Will try Tylenol and Meloxicam for few weeks to see if it helps  Fatigue, unspecified type Not sure the cause for his fatigue He does say that he has been feeling depressed recently with everything going on We will start with doing some labs on him including a TSH We will follow with him in 3 months to see if it persist will consider antidepressant  Essential hypertension His blood pressure was  on the lower side I discussed about stopping the Norvasc the patient said that his blood pressure went up when he stopped it We will continue to monitor for now  Paroxysmal atrial fibrillation ([place]) Rate controlled on Toprol Also on Eliquis He has not seen a cardiologist before Last echo done in 2018 showed EF of 65% with moderate LVH  Benign prostatic hyperplasia with urinary frequency Symptoms controlled on Hytrin and Proscar  Mixed hyperlipidemia On statin LDL 58 Repeat Fasting Lipid in follow up   Benign neoplasm of colon, unspecified part of colon He has family history of Colon Cancer and personal history of Polyp GI told him that he does not need due to his age and I told him that I agree with them.  At  this time we would not pursue colonoscopy unless patient has any changes in his bowel habits.  Stage 3a chronic kidney disease Creat Stable   Labs/tests ordered:  * No order type specified * Next appt:  Visit date not found  Total time spent in this patient care encounter was  _45  minutes; greater than 50% of the visit spent counseling patient and staff, reviewing records , Labs and coordinating care for problems addressed at this encounter.

## 2019-11-22 ENCOUNTER — Other Ambulatory Visit: Payer: Medicare Other

## 2019-11-22 ENCOUNTER — Other Ambulatory Visit: Payer: Self-pay

## 2019-11-22 DIAGNOSIS — R5383 Other fatigue: Secondary | ICD-10-CM

## 2019-11-22 DIAGNOSIS — E782 Mixed hyperlipidemia: Secondary | ICD-10-CM

## 2019-11-23 LAB — CBC WITH DIFFERENTIAL/PLATELET
Absolute Monocytes: 434 cells/uL (ref 200–950)
Basophils Absolute: 42 cells/uL (ref 0–200)
Basophils Relative: 0.6 %
Eosinophils Absolute: 259 cells/uL (ref 15–500)
Eosinophils Relative: 3.7 %
HCT: 43.3 % (ref 38.5–50.0)
Hemoglobin: 14.8 g/dL (ref 13.2–17.1)
Lymphs Abs: 2247 cells/uL (ref 850–3900)
MCH: 32.3 pg (ref 27.0–33.0)
MCHC: 34.2 g/dL (ref 32.0–36.0)
MCV: 94.5 fL (ref 80.0–100.0)
MPV: 9.2 fL (ref 7.5–12.5)
Monocytes Relative: 6.2 %
Neutro Abs: 4018 cells/uL (ref 1500–7800)
Neutrophils Relative %: 57.4 %
Platelets: 196 10*3/uL (ref 140–400)
RBC: 4.58 10*6/uL (ref 4.20–5.80)
RDW: 13.1 % (ref 11.0–15.0)
Total Lymphocyte: 32.1 %
WBC: 7 10*3/uL (ref 3.8–10.8)

## 2019-11-23 LAB — COMPLETE METABOLIC PANEL WITH GFR
AG Ratio: 1.4 (calc) (ref 1.0–2.5)
ALT: 10 U/L (ref 9–46)
AST: 19 U/L (ref 10–35)
Albumin: 3.7 g/dL (ref 3.6–5.1)
Alkaline phosphatase (APISO): 87 U/L (ref 35–144)
BUN/Creatinine Ratio: 14 (calc) (ref 6–22)
BUN: 16 mg/dL (ref 7–25)
CO2: 26 mmol/L (ref 20–32)
Calcium: 9 mg/dL (ref 8.6–10.3)
Chloride: 107 mmol/L (ref 98–110)
Creat: 1.16 mg/dL — ABNORMAL HIGH (ref 0.70–1.11)
GFR, Est African American: 65 mL/min/{1.73_m2} (ref >=60)
GFR, Est Non African American: 56 mL/min/{1.73_m2} — ABNORMAL LOW (ref >=60)
Globulin: 2.6 g/dL (calc) (ref 1.9–3.7)
Glucose, Bld: 97 mg/dL (ref 65–99)
Potassium: 3.9 mmol/L (ref 3.5–5.3)
Sodium: 142 mmol/L (ref 135–146)
Total Bilirubin: 0.6 mg/dL (ref 0.2–1.2)
Total Protein: 6.3 g/dL (ref 6.1–8.1)

## 2019-11-23 LAB — LIPID PANEL
Cholesterol: 123 mg/dL (ref <200)
HDL: 42 mg/dL (ref >=40)
LDL Cholesterol (Calc): 60 mg/dL (calc)
Non-HDL Cholesterol (Calc): 81 mg/dL (calc) (ref <130)
Total CHOL/HDL Ratio: 2.9 (calc) (ref <5.0)
Triglycerides: 127 mg/dL (ref <150)

## 2019-11-23 LAB — T4, FREE: Free T4: 0.8 ng/dL (ref 0.8–1.8)

## 2019-11-23 LAB — TSH: TSH: 7.94 mIU/L — ABNORMAL HIGH (ref 0.40–4.50)

## 2019-12-01 ENCOUNTER — Other Ambulatory Visit: Payer: Self-pay

## 2019-12-01 ENCOUNTER — Encounter: Payer: Self-pay | Admitting: Internal Medicine

## 2019-12-01 ENCOUNTER — Non-Acute Institutional Stay: Payer: Medicare Other | Admitting: Internal Medicine

## 2019-12-01 VITALS — BP 160/90 | HR 72 | Temp 97.8°F | Ht 67.0 in | Wt 186.8 lb

## 2019-12-01 DIAGNOSIS — G8929 Other chronic pain: Secondary | ICD-10-CM

## 2019-12-01 DIAGNOSIS — E039 Hypothyroidism, unspecified: Secondary | ICD-10-CM | POA: Diagnosis not present

## 2019-12-01 DIAGNOSIS — I480 Paroxysmal atrial fibrillation: Secondary | ICD-10-CM | POA: Diagnosis not present

## 2019-12-01 DIAGNOSIS — N1831 Chronic kidney disease, stage 3a: Secondary | ICD-10-CM | POA: Diagnosis not present

## 2019-12-01 DIAGNOSIS — I10 Essential (primary) hypertension: Secondary | ICD-10-CM

## 2019-12-01 DIAGNOSIS — M25511 Pain in right shoulder: Secondary | ICD-10-CM

## 2019-12-01 MED ORDER — MELOXICAM 15 MG PO TABS: 15.0000 mg | ORAL_TABLET | Freq: Every day | ORAL | 0 refills | Status: DC | PRN

## 2019-12-01 MED ORDER — LEVOTHYROXINE SODIUM 25 MCG PO TABS: 25.0000 ug | ORAL_TABLET | Freq: Every day | ORAL | 2 refills | Status: DC

## 2019-12-01 NOTE — Progress Notes (Signed)
Location: [place] of Service:  Clinic (12)  Provider:   Code Status:  Goals of Care:  Advanced Directives 05/19/2019  Does Patient Have a Medical Advance Directive? No  Type of Advance Directive -  Does patient want to make changes to medical advance directive? -  Copy of [place] in Chart? -  Would patient like information on creating a medical advance directive? No - Patient declined     Chief Complaint  Patient presents with  . Acute Visit    Fatigue, R shoulder    HPI: Patient is a 85 y.o. male seen today for an acute visit for Follow up of his Fatigue.  Fatigue Patient with history, BPH, hypertension, hyperlipidemia, right shoulder pain  He was seen on last visit with c/o fatigue.  He had some blood work done which is all normal except his TSH is elevated with low T4. Today when patient came to see me in Clinic Nurse got his HR as 120.  We did an EKG which showed that he was in A. fib controlled with heart rate of 72.  Patient continues to feel fatigued and weak.  Patient tired after walking.  He also was complaining of mild shortness of breath after exertion  He was started on meloxicam for his right shoulder pain last visit.  He states his pain was much improved a normal lipase right shoulder is better. Patient does state that he has been depressed as he is unable to socialize with his friends due to Covid.  He also is Primary caregiver for his wife who has dementia   Past Medical History:  Diagnosis Date  . Atrial fibrillation ([place]) 03/26/2011  . Basal cell carcinoma of other specified sites of skin 4/10/012  . Basal cell carcinoma of skin, site unspecified 03/25/2012  . Benign neoplasm of colon 03/25/2012   adenomatous polyps  . Broken arm 11/25/2017  . Colon polyp 2014  . Degeneration of lumbar or lumbosacral intervertebral disc 09/24/2011  . Disturbance of skin sensation 01/28/2012  . Diverticulosis 2014  . Dysphagia,  unspecified(787.20) 03/26/2011  . Elevated prostate specific antigen (PSA) 03/26/2011  . Exposure to unspecified radiation 03/26/2011  . Family history of colon cancer 07/16/2013  . GERD (gastroesophageal reflux disease) 07/16/2013  . Hearing deficit 12/21/2013  . Hemangioma of unspecified site 03/26/2011  . Hx of adenomatous colonic polyps 07/16/2013  . Hypertrophy of prostate with urinary obstruction and other lower urinary tract symptoms (LUTS) 03/26/2011  . Ingrowing nail 03/26/2011  . Internal hemorrhoids without mention of complication 99991111  . Keratosis, actinic 01/28/2012  . Long term (current) use of anticoagulants 03/26/2011  . Long term current use of anticoagulant therapy 07/23/2016  . Lumbago 03/26/2011  . 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 Osteoarthrosis, unspecified whether generalized or localized, unspecified site 11/24/2012  . Other and unspecified hyperlipidemia 03/26/2011  . Other malaise and fatigue 03/26/2011  . Pain in joint, forearm 11/24/2012  . Pain in joint, lower leg 06/15/13   left knee  . Paresthesia of left arm 07/05/2014  . Rectal bleed 06/15/13  . Reflux esophagitis 03/26/2011  . Sacroiliitis, not elsewhere classified ([place]) 05/28/2011  . Scoliosis (and kyphoscoliosis), idiopathic 05/28/2011  . 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 Tinnitus 12/21/2013  . 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 Unspecified essential hypertension 03/26/2011    Past Surgical  History:  Procedure Laterality Date  . BASAL CELL CARCINOMA EXCISION Left 12/10/14   Dr. Jarome Matin  . COLONOSCOPY  08/12/2013   Dr. Hilarie Fredrickson adenomatous polyps  . CRYOTHERAPY Left 2009   hand  . PHOTODYNAMIC THERAPY  09/26/14   face and ear lobs  Dr. Jarome Matin  . SQUAMOUS CELL CARCINOMA EXCISION Left 2010   hand    Allergies  Allergen Reactions  . Ciprofloxacin Other (See Comments)     Patient doesn't like the way it makes him feel  . Zestril [Lisinopril]     unknown  . Sulfa Antibiotics Rash    Outpatient Encounter Medications as of 12/01/2019  Medication Sig  . acetaminophen (TYLENOL) 500 MG tablet Take 500 mg by mouth daily as needed. Take it twice a day 3 days a week  . amLODipine (NORVASC) 2.5 MG tablet TAKE 1 TABLET DAILY  . ELIQUIS 5 MG TABS tablet TAKE 1 TABLET TWICE A DAY FOR ANTICOAGULATION  . famotidine (PEPCID) 20 MG tablet Take 20 mg by mouth as needed for heartburn or indigestion.  . finasteride (PROSCAR) 5 MG tablet TAKE 1 TABLET DAILY FOR URINATING DRIBBLING/PROSTATE ENLARGEMENT  . metoprolol succinate (TOPROL XL) 50 MG 24 hr tablet Take one tablet by mouth twice daily to control blood pressure and heart rhythm. Take with or immediately following a meal.  . simvastatin (ZOCOR) 10 MG tablet Take 1 tablet daily to lower cholesterol  . terazosin (HYTRIN) 2 MG capsule TAKE 1 CAPSULE DAILY  . valsartan (DIOVAN) 160 MG tablet Take 160 mg by mouth daily.  Marland Kitchen levothyroxine (SYNTHROID) 25 MCG tablet Take 1 tablet (25 mcg total) by mouth daily before breakfast.  . meloxicam (MOBIC) 15 MG tablet Take 1 tablet (15 mg total) by mouth daily as needed for pain.  . [DISCONTINUED] meloxicam (MOBIC) 15 MG tablet Take 1 tablet (15 mg total) by mouth daily for 15 days.   No facility-administered encounter medications on file as of 12/01/2019.    Review of Systems:  Review of Systems  Constitutional: Negative.   HENT: Negative.   Respiratory: Negative.   Cardiovascular: Negative.   Gastrointestinal: Negative.   Genitourinary: Negative.   Musculoskeletal: Positive for arthralgias and myalgias.  Skin: Negative.   Neurological: Negative.   Psychiatric/Behavioral: Positive for dysphoric mood.    Health Maintenance  Topic Date Due  . COLONOSCOPY  05/18/2020 (Originally 08/12/2018)  . TETANUS/TDAP  09/04/2026  . INFLUENZA VACCINE  Completed  . PNA vac Low Risk Adult   Completed    Physical Exam: Vitals:   12/01/19 1638  BP: (!) 160/90  Pulse: 72  Temp: 97.8 F (36.6 C)  SpO2: 97%  Weight: 186 lb 12.8 oz (84.7 kg)  Height: 5\' 7"  (1.702 m)   Body mass index is 29.26 kg/m. Physical Exam  Constitutional: Oriented to person, place, and time. Well-developed and well-nourished.  HENT:  Head: Normocephalic.  Mouth/Throat: Oropharynx is clear and moist.  Eyes: Pupils are equal, round, and reactive to light.  Neck: Neck supple.  Cardiovascular: Normal rate and normal heart sounds.  No murmur heard.Irregular HR Pulmonary/Chest: Effort normal and breath sounds normal. No respiratory distress. No wheezes. She has no rales.  Abdominal: Soft. Bowel sounds are normal. No distension. There is no tenderness. There is no rebound.  Musculoskeletal: No edema.  Lymphadenopathy: none Neurological: Alert and oriented to person, place, and time.  Skin: Skin is warm and dry.  Psychiatric: Normal mood and affect. Behavior is normal. Thought content normal.  Labs reviewed: Basic Metabolic Panel: Recent Labs    06/16/19 1358 11/22/19 0800  NA 143 142  K 4.0 3.9  CL 109 107  CO2 24 26  GLUCOSE 89 97  BUN 14 16  CREATININE 1.22* 1.16*  CALCIUM 9.2 9.0  TSH 6.30* 7.94*   Liver Function Tests: Recent Labs    06/16/19 1358 11/22/19 0800  AST 14 19  ALT 7* 10  BILITOT 0.5 0.6  PROT 6.2 6.3   No results for input(s): LIPASE, AMYLASE in the last 8760 hours. No results for input(s): AMMONIA in the last 8760 hours. CBC: Recent Labs    06/16/19 1358 11/22/19 0800  WBC 5.8 7.0  NEUTROABS 3,242 4,018  HGB 13.5 14.8  HCT 39.2 43.3  MCV 96.1 94.5  PLT 185 196   Lipid Panel: Recent Labs    06/16/19 1358 11/22/19 0800  CHOL 118 123  HDL 40 42  LDLCALC 58 60  TRIG 118 127  CHOLHDL 3.0 2.9   No results found for: HGBA1C  Procedures since last visit: No results found.  Assessment/Plan  Paroxysmal atrial fibrillation (HCC) - Plan: EKG  12-Lead, Ambulatory referral to Cardiology He is on Toprol and Eliquis He has never seen Cardiology in [place] but his recent c/o Fatigue and Sob will Refer him for further Eval  Hypothyroidism Due to His fatigue will start him on Synthroid now 25 mcg Repeat TSH in 8 weeks  Essential hypertension Was elevated today He will get it checked with Facility Nurse Q weekly and Bring it  If SBP more then 150 /90 he will call our Office  Stage 3a chronic kidney disease Creat is stable  Chronic right shoulder pain On Meloxicam which really helped his pain. Will use Mobic PRN for Pain Had seen orthopedics before.  His x-ray showed severe arthritic changes in the glenohumeral joint He had a steroid injection but he states it made it worse Since he has quit playing tennis due to Covid the pain has come back.  Benign prostatic hyperplasia with urinary frequency Symptoms controlled on Hytrin and Proscar  Mixed hyperlipidemia On statin LDL 60      Labs/tests ordered:  * No order type specified * Next appt:  02/16/2020 Total time spent in this patient care encounter was  45_  minutes; greater than 50% of the visit spent counseling patient and staff, reviewing records , Labs and coordinating care for problems addressed at this encounter.

## 2020-01-04 ENCOUNTER — Other Ambulatory Visit: Payer: Self-pay

## 2020-01-04 ENCOUNTER — Encounter: Payer: Self-pay | Admitting: Cardiovascular Disease

## 2020-01-04 ENCOUNTER — Ambulatory Visit (INDEPENDENT_AMBULATORY_CARE_PROVIDER_SITE_OTHER): Payer: Medicare Other | Admitting: Cardiovascular Disease

## 2020-01-04 DIAGNOSIS — E782 Mixed hyperlipidemia: Secondary | ICD-10-CM

## 2020-01-04 DIAGNOSIS — I4811 Longstanding persistent atrial fibrillation: Secondary | ICD-10-CM | POA: Diagnosis not present

## 2020-01-04 DIAGNOSIS — I10 Essential (primary) hypertension: Secondary | ICD-10-CM | POA: Diagnosis not present

## 2020-01-04 NOTE — Assessment & Plan Note (Signed)
History of essential hypertension with blood pressure measured today 130/86.  He is on amlodipine, metoprolol and valsartan.

## 2020-01-04 NOTE — Progress Notes (Signed)
01/04/2020 [name]   09/04/1929  [id number]  Primary Physician Virgie Dad, MD Primary Cardiologist: Lorretta Harp MD [name], [place]  HPI:  Phillip [name] is a 86 y.o. mildly overweight married Caucasian male father of 42, grandfather of 61 grandchildren referred to me by Dr. Lyndel Safe for cardiovascular evaluation because of A. fib.  He is retired from working for [employer] and 82.  He moved down to [place] from [place] back in 2012.  His risk factors include treated hyper tension and hyperlipidemia.  He is fairly active and was playing tennis once a week up until the onset of COVID-19.  He has A. fib dating back to 2008 and is TIAs in the past.  Has been on Eliquis oral anticoagulation since 2012.  He denies chest pain or shortness of breath.   Current Meds  Medication Sig  . acetaminophen (TYLENOL) 500 MG tablet Take 500 mg by mouth daily as needed. Take it twice a day 3 days a week  . amLODipine (NORVASC) 2.5 MG tablet TAKE 1 TABLET DAILY  . ELIQUIS 5 MG TABS tablet TAKE 1 TABLET TWICE A DAY FOR ANTICOAGULATION  . famotidine (PEPCID) 20 MG tablet Take 20 mg by mouth as needed for heartburn or indigestion.  . finasteride (PROSCAR) 5 MG tablet TAKE 1 TABLET DAILY FOR URINATING DRIBBLING/PROSTATE ENLARGEMENT  . levothyroxine (SYNTHROID) 25 MCG tablet Take 1 tablet (25 mcg total) by mouth daily before breakfast.  . meloxicam (MOBIC) 15 MG tablet Take 1 tablet (15 mg total) by mouth daily as needed for pain.  . metoprolol succinate (TOPROL XL) 50 MG 24 hr tablet Take one tablet by mouth twice daily to control blood pressure and heart rhythm. Take with or immediately following a meal.  . simvastatin (ZOCOR) 10 MG tablet Take 1 tablet daily to lower cholesterol  . terazosin (HYTRIN) 2 MG capsule TAKE 1 CAPSULE DAILY  . valsartan (DIOVAN) 160 MG tablet Take 160 mg by mouth daily.     Allergies  Allergen Reactions  . Ciprofloxacin Other (See Comments)   Patient doesn't like the way it makes him feel  . Zestril [Lisinopril]     unknown  . Sulfa Antibiotics Rash    Social History   Socioeconomic History  . Marital status: Married    Spouse name: Not on file  . Number of children: Not on file  . Years of education: Not on file  . Highest education level: Not on file  Occupational History  . 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 Smoking status: Never Smoker  . Smokeless tobacco: Never Used  Substance and Sexual Activity  . Alcohol use: No  . Drug use: No  . Sexual activity: Not on file  Other Topics Concern  . Not on file  Social History Narrative   Lives at [facility] since 11/2010   Married - [place] [employer]   Does not have Living Will   Never smoked   Alcohol none   Exercise: tennis, pool, table tennis 2-3 times a week   Social Determinants of Health   Financial Resource Strain:   . Difficulty of Paying Living Expenses: Not on file  Food Insecurity:   . Worried About Charity fundraiser in the Last Year: Not on file  . Ran Out of Food in the Last Year: Not on file  Transportation Needs:   . Lack of Transportation (  Medical): Not on file  . Lack of Transportation (Non-Medical): Not on file  Physical Activity:   . Days of Exercise per Week: Not on file  . Minutes of Exercise per Session: Not on file  Stress:   . Feeling of Stress : Not on file  Social Connections:   . Frequency of Communication with Friends and Family: Not on file  . Frequency of Social Gatherings with Friends and Family: Not on file  . Attends Religious Services: Not on file  . Active Member of Clubs or Organizations: Not on file  . Attends Archivist Meetings: Not on file  . Marital Status: Not on file  Intimate Partner Violence:   . Fear of Current or Ex-Partner: Not on file  . Emotionally Abused: Not on file  . Physically Abused: Not on file  . Sexually Abused: Not on file      Review of Systems: General: negative for chills, fever, night sweats or weight changes.  Cardiovascular: negative for chest pain, dyspnea on exertion, edema, orthopnea, palpitations, paroxysmal nocturnal dyspnea or shortness of breath Dermatological: negative for rash Respiratory: negative for cough or wheezing Urologic: negative for hematuria Abdominal: negative for nausea, vomiting, diarrhea, bright red blood per rectum, melena, or hematemesis Neurologic: negative for visual changes, syncope, or dizziness All other systems reviewed and are otherwise negative except as noted above.    Blood pressure 130/86, pulse 86, temperature (!) 96.8 F (36 C), height 5\' 7"  (1.702 m), weight 184 lb (83.5 kg).  General appearance: alert and no distress Neck: no adenopathy, no carotid bruit, no JVD, supple, symmetrical, trachea midline and thyroid not enlarged, symmetric, no tenderness/mass/nodules Lungs: clear to auscultation bilaterally Heart: irregularly irregular rhythm Extremities: extremities normal, atraumatic, no cyanosis or edema Pulses: 2+ and symmetric Skin: Skin color, texture, turgor normal. No rashes or lesions Neurologic: Alert and oriented X 3, normal strength and tone. Normal symmetric reflexes. Normal coordination and gait  EKG not performed today  ASSESSMENT AND PLAN:   Atrial fibrillation ([place]) History of persistent atrial fibrillation dating back to 2008.  He has had TIAs in the past and is currently on Eliquis oral anticoagulation.  Hyperlipidemia History of hyperlipidemia on statin therapy with lipid profile performed 11/22/2019 revealing total cholesterol 123, LDL 60 and HDL 42.  Essential hypertension History of essential hypertension with blood pressure measured today 130/86.  He is on amlodipine, metoprolol and valsartan.      Lorretta Harp MD FACP,FACC,FAHA, [facility] 01/04/2020 11:38 AM

## 2020-01-04 NOTE — Assessment & Plan Note (Signed)
History of hyperlipidemia on statin therapy with lipid profile performed 11/22/2019 revealing total cholesterol 123, LDL 60 and HDL 42.

## 2020-01-04 NOTE — Patient Instructions (Signed)
Medication Instructions:  Your physician recommends that you continue on your current medications as directed. Please refer to the Current Medication list given to you today.  If you need a refill on your cardiac medications before your next appointment, please call your pharmacy.   Lab work: NONE  Testing/Procedures: NONE  Follow-Up: At [facility], you and your health needs are our priority.  As part of our continuing mission to provide you with exceptional heart care, we have created designated Provider Care Teams.  These Care Teams include your primary Cardiologist (physician) and Advanced Practice Providers (APPs -  Physician Assistants and Nurse Practitioners) who all work together to provide you with the care you need, when you need it. You may see Dr. Berry or one of the following Advanced Practice Providers on your designated Care Team:    Luke Kilroy, PA-C  Callie Goodrich, PA-C  Jesse Cleaver, FNP  Your physician wants you to follow-up in: 1 year with Dr. Berry       

## 2020-01-04 NOTE — Assessment & Plan Note (Signed)
History of persistent atrial fibrillation dating back to 2008.  He has had TIAs in the past and is currently on Eliquis oral anticoagulation.

## 2020-01-20 ENCOUNTER — Other Ambulatory Visit: Payer: Self-pay

## 2020-01-20 DIAGNOSIS — N1831 Chronic kidney disease, stage 3a: Secondary | ICD-10-CM

## 2020-01-20 LAB — COMPLETE METABOLIC PANEL WITH GFR
AG Ratio: 1.3 (calc) (ref 1.0–2.5)
ALT: 10 U/L (ref 9–46)
AST: 18 U/L (ref 10–35)
Albumin: 3.6 g/dL (ref 3.6–5.1)
Alkaline phosphatase (APISO): 75 U/L (ref 35–144)
BUN: 14 mg/dL (ref 7–25)
CO2: 26 mmol/L (ref 20–32)
Calcium: 9.1 mg/dL (ref 8.6–10.3)
Chloride: 108 mmol/L (ref 98–110)
Creat: 1.09 mg/dL (ref 0.70–1.11)
GFR, Est African American: 69 mL/min/{1.73_m2} (ref >=60)
GFR, Est Non African American: 60 mL/min/{1.73_m2} (ref >=60)
Globulin: 2.8 g/dL (calc) (ref 1.9–3.7)
Glucose, Bld: 96 mg/dL (ref 65–99)
Potassium: 3.9 mmol/L (ref 3.5–5.3)
Sodium: 142 mmol/L (ref 135–146)
Total Bilirubin: 0.7 mg/dL (ref 0.2–1.2)
Total Protein: 6.4 g/dL (ref 6.1–8.1)

## 2020-01-26 ENCOUNTER — Other Ambulatory Visit: Payer: Self-pay

## 2020-01-26 ENCOUNTER — Encounter: Payer: Self-pay | Admitting: Internal Medicine

## 2020-01-26 ENCOUNTER — Non-Acute Institutional Stay: Payer: Medicare Other | Admitting: Internal Medicine

## 2020-01-26 VITALS — BP 146/82 | HR 78 | Temp 98.0°F | Ht 67.0 in | Wt 185.9 lb

## 2020-01-26 DIAGNOSIS — I10 Essential (primary) hypertension: Secondary | ICD-10-CM

## 2020-01-26 DIAGNOSIS — I480 Paroxysmal atrial fibrillation: Secondary | ICD-10-CM | POA: Diagnosis not present

## 2020-01-26 DIAGNOSIS — M19011 Primary osteoarthritis, right shoulder: Secondary | ICD-10-CM

## 2020-01-26 DIAGNOSIS — E039 Hypothyroidism, unspecified: Secondary | ICD-10-CM | POA: Diagnosis not present

## 2020-01-26 DIAGNOSIS — R5383 Other fatigue: Secondary | ICD-10-CM

## 2020-01-26 DIAGNOSIS — E782 Mixed hyperlipidemia: Secondary | ICD-10-CM

## 2020-01-26 DIAGNOSIS — N1831 Chronic kidney disease, stage 3a: Secondary | ICD-10-CM

## 2020-01-26 MED ORDER — LEVOTHYROXINE SODIUM 25 MCG PO TABS: 25.0000 ug | ORAL_TABLET | Freq: Every day | ORAL | 2 refills | Status: DC

## 2020-01-26 NOTE — Progress Notes (Signed)
Location: [place] of Service:  Clinic (12)  Provider:   Code Status: Want Goals of Care:  Advanced Directives 05/19/2019  Does Patient Have a Medical Advance Directive? No  Type of Advance Directive -  Does patient want to make changes to medical advance directive? -  Copy of [place] in Chart? -  Would patient like information on creating a medical advance directive? No - Patient declined     Chief Complaint  Patient presents with  . Medical Management of Chronic Issues    8 week follow up    HPI: Patient is a 86 y.o. male seen today for an acute visit for Follow up  Patient with history, BPH, hypertension, hyperlipidemia, right shoulder pain and Hypothyroidism  Right Shoulder Pain Continuous to be issues with him.  Patient used to play tennis before.  Has  not able to do it due to Covid.  Wants to go back.  But continues to have right shoulder pain especially when he tries to raise arm above the shoulder.  He states that he had it injected before which did not help. Fatigue Since he was started on low-dose of Synthroid for his high TSH and low T4.  He does feel little better.  He is not sure is it because of that. Mild depression Coping well.  He is the main caregiver for his wife's dementia Hypertension His blood pressure was elevated on the last visit.  He got it checked by the facility nurse and it has been less than 140/80 Atrial fibrillation EKG last done showed A. fib with heart rate of 72 Was seen by cardiology.  They did not recommend anything change in his therapy Continues to be on Eliquis and Toprol ? Visual Floaters Seems to be new problem.  He has had 1 or 2 episodes of that.  No  loss of vision.  No pain no discharge.   Past Medical History:  Diagnosis Date  . Atrial fibrillation ([place]) 03/26/2011  . Basal cell carcinoma of other specified sites of skin 4/10/012  . Basal cell carcinoma of skin, site unspecified  03/25/2012  . Benign neoplasm of colon 03/25/2012   adenomatous polyps  . Broken arm 11/25/2017  . Colon polyp 2014  . Degeneration of lumbar or lumbosacral intervertebral disc 09/24/2011  . Disturbance of skin sensation 01/28/2012  . Diverticulosis 2014  . Dysphagia, unspecified(787.20) 03/26/2011  . Elevated prostate specific antigen (PSA) 03/26/2011  . Exposure to unspecified radiation 03/26/2011  . Family history of colon cancer 07/16/2013  . GERD (gastroesophageal reflux disease) 07/16/2013  . Hearing deficit 12/21/2013  . Hemangioma of unspecified site 03/26/2011  . Hx of adenomatous colonic polyps 07/16/2013  . Hypertrophy of prostate with urinary obstruction and other lower urinary tract symptoms (LUTS) 03/26/2011  . Ingrowing nail 03/26/2011  . Internal hemorrhoids without mention of complication 99991111  . Keratosis, actinic 01/28/2012  . Long term (current) use of anticoagulants 03/26/2011  . Long term current use of anticoagulant therapy 07/23/2016  . Lumbago 03/26/2011  . 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 Osteoarthrosis, unspecified whether generalized or localized, unspecified site 11/24/2012  . Other and unspecified hyperlipidemia 03/26/2011  . Other malaise and fatigue 03/26/2011  . Pain in joint, forearm 11/24/2012  . Pain in joint, lower leg 06/15/13   left knee  . Paresthesia of left arm 07/05/2014  . Rectal bleed 06/15/13  . Reflux esophagitis 03/26/2011  . Sacroiliitis,  not elsewhere classified ([place]) 05/28/2011  . Scoliosis (and kyphoscoliosis), idiopathic 05/28/2011  . 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 Tinnitus 12/21/2013  . 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 Unspecified essential hypertension 03/26/2011    Past Surgical History:  Procedure Laterality Date  . BASAL CELL CARCINOMA EXCISION Left 12/10/14   Dr. Jarome Matin  . COLONOSCOPY  08/12/2013    Dr. Hilarie Fredrickson adenomatous polyps  . CRYOTHERAPY Left 2009   hand  . PHOTODYNAMIC THERAPY  09/26/14   face and ear lobs  Dr. Jarome Matin  . SQUAMOUS CELL CARCINOMA EXCISION Left 2010   hand    Allergies  Allergen Reactions  . Ciprofloxacin Other (See Comments)    Patient doesn't like the way it makes him feel  . Zestril [Lisinopril]     unknown  . Sulfa Antibiotics Rash    Outpatient Encounter Medications as of 01/26/2020  Medication Sig  . acetaminophen (TYLENOL) 500 MG tablet Take 500 mg by mouth daily as needed. Take it twice a day 3 days a week  . amLODipine (NORVASC) 2.5 MG tablet TAKE 1 TABLET DAILY  . ELIQUIS 5 MG TABS tablet TAKE 1 TABLET TWICE A DAY FOR ANTICOAGULATION  . famotidine (PEPCID) 20 MG tablet Take 20 mg by mouth as needed for heartburn or indigestion.  . finasteride (PROSCAR) 5 MG tablet TAKE 1 TABLET DAILY FOR URINATING DRIBBLING/PROSTATE ENLARGEMENT  . levothyroxine (SYNTHROID) 25 MCG tablet Take 1 tablet (25 mcg total) by mouth daily before breakfast.  . meloxicam (MOBIC) 15 MG tablet Take 1 tablet (15 mg total) by mouth daily as needed for pain.  . metoprolol succinate (TOPROL XL) 50 MG 24 hr tablet Take one tablet by mouth twice daily to control blood pressure and heart rhythm. Take with or immediately following a meal.  . simvastatin (ZOCOR) 10 MG tablet Take 1 tablet daily to lower cholesterol  . terazosin (HYTRIN) 2 MG capsule TAKE 1 CAPSULE DAILY  . valsartan (DIOVAN) 160 MG tablet Take 160 mg by mouth daily.   No facility-administered encounter medications on file as of 01/26/2020.    Review of Systems:  Review of Systems  Constitutional: Negative.   HENT: Negative.   Respiratory: Negative.   Cardiovascular: Negative.   Gastrointestinal: Negative.   Genitourinary: Negative.   Musculoskeletal: Positive for arthralgias.  Skin: Negative.   Neurological: Negative.   Psychiatric/Behavioral: Positive for dysphoric mood.    Health Maintenance    Topic Date Due  . COLONOSCOPY  05/18/2020 (Originally 08/12/2018)  . TETANUS/TDAP  09/04/2026  . INFLUENZA VACCINE  Completed  . PNA vac Low Risk Adult  Completed    Physical Exam: Vitals:   01/26/20 1332  BP: (!) 146/82  Pulse: 78  Temp: 98 F (36.7 C)  SpO2: 97%  Weight: 185 lb 14.4 oz (84.3 kg)  Height: 5\' 7"  (1.702 m)   Body mass index is 29.12 kg/m. Physical Exam Vitals reviewed.  Constitutional:      Appearance: Normal appearance.  HENT:     Head: Normocephalic.     Nose: Nose normal.     Mouth/Throat:     Mouth: Mucous membranes are moist.     Pharynx: Oropharynx is clear.  Eyes:     Pupils: Pupils are equal, round, and reactive to light.     Comments: No Redness or Visual Field Loss  Cardiovascular:     Rate and Rhythm: Normal rate and regular  rhythm.     Pulses: Normal pulses.     Heart sounds: Normal heart sounds.  Pulmonary:     Effort: Pulmonary effort is normal. No respiratory distress.     Breath sounds: No wheezing or rales.  Abdominal:     General: Abdomen is flat.     Palpations: Abdomen is soft.  Musculoskeletal:        General: No swelling.     Cervical back: Neck supple.  Skin:    General: Skin is warm and dry.  Neurological:     General: No focal deficit present.     Mental Status: He is alert and oriented to person, place, and time.  Psychiatric:        Mood and Affect: Mood normal.        Thought Content: Thought content normal.     Labs reviewed: Basic Metabolic Panel: Recent Labs    06/16/19 1358 11/22/19 0800 01/19/20 0925  NA 143 142 142  K 4.0 3.9 3.9  CL 109 107 108  CO2 24 26 26   GLUCOSE 89 97 96  BUN 14 16 14   CREATININE 1.22* 1.16* 1.09  CALCIUM 9.2 9.0 9.1  TSH 6.30* 7.94*  --    Liver Function Tests: Recent Labs    06/16/19 1358 11/22/19 0800 01/19/20 0925  AST 14 19 18   ALT 7* 10 10  BILITOT 0.5 0.6 0.7  PROT 6.2 6.3 6.4   No results for input(s): LIPASE, AMYLASE in the last 8760 hours. No  results for input(s): AMMONIA in the last 8760 hours. CBC: Recent Labs    06/16/19 1358 11/22/19 0800  WBC 5.8 7.0  NEUTROABS 3,242 4,018  HGB 13.5 14.8  HCT 39.2 43.3  MCV 96.1 94.5  PLT 185 196   Lipid Panel: Recent Labs    06/16/19 1358 11/22/19 0800  CHOL 118 123  HDL 40 42  LDLCALC 58 60  TRIG 118 127  CHOLHDL 3.0 2.9   No results found for: HGBA1C  Procedures since last visit: No results found.  Assessment/Plan  Osteoarthritis of right shoulder, unspecified osteoarthritis type Continues to be the issue. Cannot raise his Right Arm without pain and also feel weak when he tries to raise it Wants to go back to play tennis if Possible Had seen Sports Medicine Dr Micheline Chapman  before. His x-ray showed severe arthritic changes in the glenohumeral joint He had a steroid injection but he states it made it worse Will refer him to ortho Care for another opinion  Hypothyroidism, unspecified type Continue on Low dose of Synthyroid Repeat TSH pending  Paroxysmal atrial fibrillation (HCC) Was seen by Cardiology No New recommendations Essential hypertension BP checked by Nurse in [place] shows all in  Less then 140/90  Stage 3a chronic kidney disease Creat Stable Mixed hyperlipidemia LDL in Good Limits  Fatigue, unspecified type Feels Better since started on Thyroid Med Continues to have Mild Depression Due to being Caretaker for his Wife who hasa Demtnia Does not want Meds Right now  ? Floaters He will let his Ophthalmologist know    Labs/tests ordered:  * No order type specified * Next appt:  04/26/2020

## 2020-02-16 ENCOUNTER — Encounter: Payer: Self-pay | Admitting: Internal Medicine

## 2020-02-17 ENCOUNTER — Other Ambulatory Visit: Payer: Self-pay

## 2020-02-17 DIAGNOSIS — I10 Essential (primary) hypertension: Secondary | ICD-10-CM

## 2020-02-17 DIAGNOSIS — E039 Hypothyroidism, unspecified: Secondary | ICD-10-CM

## 2020-02-17 LAB — COMPLETE METABOLIC PANEL WITH GFR
AG Ratio: 1.6 (calc) (ref 1.0–2.5)
ALT: 10 U/L (ref 9–46)
AST: 19 U/L (ref 10–35)
Albumin: 3.8 g/dL (ref 3.6–5.1)
Alkaline phosphatase (APISO): 77 U/L (ref 35–144)
BUN: 13 mg/dL (ref 7–25)
CO2: 27 mmol/L (ref 20–32)
Calcium: 9.3 mg/dL (ref 8.6–10.3)
Chloride: 107 mmol/L (ref 98–110)
Creat: 1.06 mg/dL (ref 0.70–1.11)
GFR, Est African American: 72 mL/min/{1.73_m2} (ref >=60)
GFR, Est Non African American: 62 mL/min/{1.73_m2} (ref >=60)
Globulin: 2.4 g/dL (calc) (ref 1.9–3.7)
Glucose, Bld: 87 mg/dL (ref 65–99)
Potassium: 4.2 mmol/L (ref 3.5–5.3)
Sodium: 142 mmol/L (ref 135–146)
Total Bilirubin: 0.7 mg/dL (ref 0.2–1.2)
Total Protein: 6.2 g/dL (ref 6.1–8.1)

## 2020-02-17 LAB — CBC WITH DIFFERENTIAL/PLATELET
Absolute Monocytes: 559 cells/uL (ref 200–950)
Basophils Absolute: 28 cells/uL (ref 0–200)
Basophils Relative: 0.4 %
Eosinophils Absolute: 317 cells/uL (ref 15–500)
Eosinophils Relative: 4.6 %
HCT: 43.1 % (ref 38.5–50.0)
Hemoglobin: 14.8 g/dL (ref 13.2–17.1)
Lymphs Abs: 2167 cells/uL (ref 850–3900)
MCH: 32.6 pg (ref 27.0–33.0)
MCHC: 34.3 g/dL (ref 32.0–36.0)
MCV: 94.9 fL (ref 80.0–100.0)
MPV: 9.3 fL (ref 7.5–12.5)
Monocytes Relative: 8.1 %
Neutro Abs: 3830 cells/uL (ref 1500–7800)
Neutrophils Relative %: 55.5 %
Platelets: 183 10*3/uL (ref 140–400)
RBC: 4.54 10*6/uL (ref 4.20–5.80)
RDW: 13.1 % (ref 11.0–15.0)
Total Lymphocyte: 31.4 %
WBC: 6.9 10*3/uL (ref 3.8–10.8)

## 2020-02-17 LAB — TSH: TSH: 4.14 mIU/L (ref 0.40–4.50)

## 2020-03-19 ENCOUNTER — Other Ambulatory Visit: Payer: Self-pay | Admitting: Internal Medicine

## 2020-03-22 ENCOUNTER — Other Ambulatory Visit: Payer: Self-pay | Admitting: Nurse Practitioner

## 2020-04-04 ENCOUNTER — Other Ambulatory Visit: Payer: Self-pay | Admitting: Internal Medicine

## 2020-04-04 NOTE — Telephone Encounter (Signed)
Pended Rx and sent to Dr. Gupta for approval due to HIGH ALERT Warning.  °

## 2020-04-26 ENCOUNTER — Other Ambulatory Visit: Payer: Self-pay

## 2020-04-26 ENCOUNTER — Non-Acute Institutional Stay: Payer: Medicare Other | Admitting: Internal Medicine

## 2020-04-26 ENCOUNTER — Encounter: Payer: Self-pay | Admitting: Internal Medicine

## 2020-04-26 VITALS — BP 114/76 | HR 65 | Temp 98.1°F | Ht 67.0 in | Wt 184.7 lb

## 2020-04-26 DIAGNOSIS — I10 Essential (primary) hypertension: Secondary | ICD-10-CM

## 2020-04-26 DIAGNOSIS — I480 Paroxysmal atrial fibrillation: Secondary | ICD-10-CM

## 2020-04-26 DIAGNOSIS — R350 Frequency of micturition: Secondary | ICD-10-CM

## 2020-04-26 DIAGNOSIS — E039 Hypothyroidism, unspecified: Secondary | ICD-10-CM | POA: Diagnosis not present

## 2020-04-26 DIAGNOSIS — N1831 Chronic kidney disease, stage 3a: Secondary | ICD-10-CM

## 2020-04-26 DIAGNOSIS — M19011 Primary osteoarthritis, right shoulder: Secondary | ICD-10-CM

## 2020-04-26 DIAGNOSIS — N401 Enlarged prostate with lower urinary tract symptoms: Secondary | ICD-10-CM

## 2020-04-26 DIAGNOSIS — E782 Mixed hyperlipidemia: Secondary | ICD-10-CM

## 2020-04-26 NOTE — Progress Notes (Signed)
Location:  [place] of Service:  Clinic (12)  Provider:   Code Status:  Goals of Care:  Advanced Directives 05/19/2019  Does Patient Have a Medical Advance Directive? No  Type of Advance Directive -  Does patient want to make changes to medical advance directive? -  Copy of [place] in Chart? -  Would patient like information on creating a medical advance directive? No - Patient declined     Chief Complaint  Patient presents with  . Medical Management of Chronic Issues    3 month follow up. Patient would like to discuss his right shoulder pain. He is scheduled to see an eye doc.     HPI: Patient is a 86 y.o. male seen today for medical management of chronic diseases.    Right Shoulder Pain Seen by Dr Onnie Graham. He suggested Arthroplasty due to sever osteoarthritis Patient has decided not consider surgery right now He says he is going to continue taking Tylenol PRN. Fatigue Better. Taking his Thyroid med  Mild depression Coping well.  He is the main caregiver for his wife's dementia Says his Children helping him with her care Hypertension Has Been controlled Atrial fibrillation EKG last done showed A. fib with heart rate of 72 Was seen by cardiology.  They did not recommend anything change in his therapy Continues to be on Eliquis and Toprol ? Visual Floaters Has Appointment with his Opthalmology.  Lives with his Wife in [place]. Wife had Dementia Still active  Past Medical History:  Diagnosis Date  . Atrial fibrillation ([place]) 03/26/2011  . Basal cell carcinoma of other specified sites of skin 4/10/012  . Basal cell carcinoma of skin, site unspecified 03/25/2012  . Benign neoplasm of colon 03/25/2012   adenomatous polyps  . Broken arm 11/25/2017  . Colon polyp 2014  . Degeneration of lumbar or lumbosacral intervertebral disc 09/24/2011  . Disturbance of skin sensation 01/28/2012  . Diverticulosis 2014  . Dysphagia,  unspecified(787.20) 03/26/2011  . Elevated prostate specific antigen (PSA) 03/26/2011  . Exposure to unspecified radiation 03/26/2011  . Family history of colon cancer 07/16/2013  . GERD (gastroesophageal reflux disease) 07/16/2013  . Hearing deficit 12/21/2013  . Hemangioma of unspecified site 03/26/2011  . Hx of adenomatous colonic polyps 07/16/2013  . Hypertrophy of prostate with urinary obstruction and other lower urinary tract symptoms (LUTS) 03/26/2011  . Ingrowing nail 03/26/2011  . Internal hemorrhoids without mention of complication 99991111  . Keratosis, actinic 01/28/2012  . Long term (current) use of anticoagulants 03/26/2011  . Long term current use of anticoagulant therapy 07/23/2016  . Lumbago 03/26/2011  . 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 Osteoarthrosis, unspecified whether generalized or localized, unspecified site 11/24/2012  . Other and unspecified hyperlipidemia 03/26/2011  . Other malaise and fatigue 03/26/2011  . Pain in joint, forearm 11/24/2012  . Pain in joint, lower leg 06/15/13   left knee  . Paresthesia of left arm 07/05/2014  . Rectal bleed 06/15/13  . Reflux esophagitis 03/26/2011  . Sacroiliitis, not elsewhere classified ([place]) 05/28/2011  . Scoliosis (and kyphoscoliosis), idiopathic 05/28/2011  . 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 Tinnitus 12/21/2013  . 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 Unspecified essential hypertension 03/26/2011    Past Surgical History:  Procedure Laterality Date  . BASAL CELL CARCINOMA EXCISION Left 12/10/14   Dr. Jarome Matin  .  COLONOSCOPY  08/12/2013   Dr. Hilarie Fredrickson adenomatous polyps  . CRYOTHERAPY Left 2009   hand  . PHOTODYNAMIC THERAPY  09/26/14   face and ear lobs  Dr. Jarome Matin  . SQUAMOUS CELL CARCINOMA EXCISION Left 2010   hand    Allergies  Allergen Reactions  . Ciprofloxacin Other (See Comments)     Patient doesn't like the way it makes him feel  . Zestril [Lisinopril]     unknown  . Sulfa Antibiotics Rash    Outpatient Encounter Medications as of 04/26/2020  Medication Sig  . acetaminophen (TYLENOL) 500 MG tablet Take 500 mg by mouth daily as needed. Take it twice a day 3 days a week  . amLODipine (NORVASC) 2.5 MG tablet TAKE 1 TABLET DAILY  . ELIQUIS 5 MG TABS tablet TAKE 1 TABLET TWICE A DAY FOR ANTICOAGULATION  . famotidine (PEPCID) 20 MG tablet Take 20 mg by mouth as needed for heartburn or indigestion.  . finasteride (PROSCAR) 5 MG tablet TAKE 1 TABLET DAILY FOR URINATING DRIBBLING/PROSTATE ENLARGEMENT  . levothyroxine (SYNTHROID) 25 MCG tablet Take 1 tablet (25 mcg total) by mouth daily before breakfast.  . meloxicam (MOBIC) 15 MG tablet Take 1 tablet (15 mg total) by mouth daily as needed for pain.  . metoprolol succinate (TOPROL XL) 50 MG 24 hr tablet Take one tablet by mouth twice daily to control blood pressure and heart rhythm. Take with or immediately following a meal.  . simvastatin (ZOCOR) 10 MG tablet TAKE 1 TABLET DAILY TO LOWER CHOLESTEROL  . terazosin (HYTRIN) 2 MG capsule TAKE 1 CAPSULE DAILY  . valsartan (DIOVAN) 160 MG tablet TAKE 1 TABLET DAILY   No facility-administered encounter medications on file as of 04/26/2020.    Review of Systems:  Review of Systems  Review of Systems  Constitutional: Negative for activity change, appetite change, chills, diaphoresis, fatigue and fever.  HENT: Negative for mouth sores, postnasal drip, rhinorrhea, sinus pain and sore throat.   Respiratory: Negative for apnea, cough, chest tightness, shortness of breath and wheezing.   Cardiovascular: Negative for chest pain, palpitations and leg swelling.  Gastrointestinal: Negative for abdominal distention, abdominal pain, constipation, diarrhea, nausea and vomiting.  Genitourinary: Negative for dysuria and frequency.  Musculoskeletal: Negative for arthralgias, joint swelling and  myalgias.  Skin: Negative for rash.  Neurological: Negative for dizziness, syncope, weakness, light-headedness and numbness.  Psychiatric/Behavioral: Negative for behavioral problems, confusion and sleep disturbance.     Health Maintenance  Topic Date Due  . COLONOSCOPY  05/18/2020 (Originally 08/12/2018)  . INFLUENZA VACCINE  07/16/2020  . TETANUS/TDAP  09/04/2026  . COVID-19 Vaccine  Completed  . PNA vac Low Risk Adult  Completed    Physical Exam: Vitals:   04/26/20 1335  BP: 114/76  Pulse: 65  Temp: 98.1 F (36.7 C)  SpO2: 97%  Weight: 184 lb 11.2 oz (83.8 kg)  Height: 5\' 7"  (1.702 m)   Body mass index is 28.93 kg/m. Physical Exam Constitutional: Oriented to person, place, and time. Well-developed and well-nourished.  HENT:  Head: Normocephalic.  Mouth/Throat: Oropharynx is clear and moist.  Eyes: Pupils are equal, round, and reactive to light.  Neck: Neck supple.  Cardiovascular: Normal rate and normal heart sounds.  No murmur heard.Irregular Rythm Pulmonary/Chest: Effort normal and breath sounds normal. No respiratory distress. No wheezes. She has no rales.  Abdominal: Soft. Bowel sounds are normal. No distension. There is no tenderness. There is no rebound.  Musculoskeletal: No edema.  Lymphadenopathy: none Neurological: Alert  and oriented to person, place, and time.  Skin: Skin is warm and dry.  Psychiatric: Normal mood and affect. Behavior is normal. Thought content normal.   Labs reviewed: Basic Metabolic Panel: Recent Labs    06/16/19 1358 06/16/19 1358 11/22/19 0800 01/19/20 0925 02/17/20 0745  NA 143   < > 142 142 142  K 4.0   < > 3.9 3.9 4.2  CL 109   < > 107 108 107  CO2 24   < > 26 26 27   GLUCOSE 89   < > 97 96 87  BUN 14   < > 16 14 13   CREATININE 1.22*   < > 1.16* 1.09 1.06  CALCIUM 9.2   < > 9.0 9.1 9.3  TSH 6.30*  --  7.94*  --  4.14   < > = values in this interval not displayed.   Liver Function Tests: Recent Labs     11/22/19 0800 01/19/20 0925 02/17/20 0745  AST 19 18 19   ALT 10 10 10   BILITOT 0.6 0.7 0.7  PROT 6.3 6.4 6.2   No results for input(s): LIPASE, AMYLASE in the last 8760 hours. No results for input(s): AMMONIA in the last 8760 hours. CBC: Recent Labs    06/16/19 1358 11/22/19 0800 02/17/20 0745  WBC 5.8 7.0 6.9  NEUTROABS 3,242 4,018 3,830  HGB 13.5 14.8 14.8  HCT 39.2 43.3 43.1  MCV 96.1 94.5 94.9  PLT 185 196 183   Lipid Panel: Recent Labs    06/16/19 1358 11/22/19 0800  CHOL 118 123  HDL 40 42  LDLCALC 58 60  TRIG 118 127  CHOLHDL 3.0 2.9   No results found for: HGBA1C  Procedures since last visit: No results found.  Assessment/Plan Osteoarthritis of right shoulder, unspecified osteoarthritis type Was told that he needs Arthroplasty. He does not want to go through this. Will continue Tylenol PRN Unfortunately he would not be able to Tennis  Hypothyroidism, unspecified type TSH normal Range now  Paroxysmal atrial fibrillation (HCC) ON Eliquis and Toprol Essential hypertension BP Controlled Stage 3a chronic kidney disease Creat stable Mixed hyperlipidemia LDL less then 100 Benign prostatic hyperplasia with urinary frequency On Proscar and Hytrin Visual Floaters Has Appointment with Opthalmologist Mild Depression Doing well Shingrix in 4/19- 7/19 Labs/tests ordered:  * No order type specified * Next appt:  Visit date not found

## 2020-04-26 NOTE — Patient Instructions (Signed)
Colace/docusate sodium stool softener.

## 2020-05-01 ENCOUNTER — Other Ambulatory Visit: Payer: Self-pay | Admitting: Internal Medicine

## 2020-05-24 ENCOUNTER — Other Ambulatory Visit: Payer: Self-pay | Admitting: Internal Medicine

## 2020-06-17 ENCOUNTER — Other Ambulatory Visit: Payer: Self-pay | Admitting: Internal Medicine

## 2020-09-16 ENCOUNTER — Other Ambulatory Visit: Payer: Self-pay | Admitting: Internal Medicine

## 2020-09-18 ENCOUNTER — Other Ambulatory Visit: Payer: Self-pay | Admitting: Nurse Practitioner

## 2020-09-18 NOTE — Telephone Encounter (Signed)
Drug to Drug Alert came up when trying to refill this prescription

## 2020-10-05 ENCOUNTER — Other Ambulatory Visit: Payer: Self-pay | Admitting: Internal Medicine

## 2020-10-19 ENCOUNTER — Other Ambulatory Visit: Payer: Self-pay

## 2020-10-19 DIAGNOSIS — E039 Hypothyroidism, unspecified: Secondary | ICD-10-CM

## 2020-10-19 DIAGNOSIS — I480 Paroxysmal atrial fibrillation: Secondary | ICD-10-CM

## 2020-10-19 DIAGNOSIS — E782 Mixed hyperlipidemia: Secondary | ICD-10-CM

## 2020-10-20 LAB — COMPLETE METABOLIC PANEL WITH GFR
AG Ratio: 1.5 (calc) (ref 1.0–2.5)
ALT: 9 U/L (ref 9–46)
AST: 18 U/L (ref 10–35)
Albumin: 3.8 g/dL (ref 3.6–5.1)
Alkaline phosphatase (APISO): 84 U/L (ref 35–144)
BUN/Creatinine Ratio: 13 (calc) (ref 6–22)
BUN: 15 mg/dL (ref 7–25)
CO2: 23 mmol/L (ref 20–32)
Calcium: 9.3 mg/dL (ref 8.6–10.3)
Chloride: 107 mmol/L (ref 98–110)
Creat: 1.19 mg/dL — ABNORMAL HIGH (ref 0.70–1.11)
GFR, Est African American: 62 mL/min/{1.73_m2} (ref >=60)
GFR, Est Non African American: 54 mL/min/{1.73_m2} — ABNORMAL LOW (ref >=60)
Globulin: 2.6 g/dL (calc) (ref 1.9–3.7)
Glucose, Bld: 85 mg/dL (ref 65–99)
Potassium: 3.9 mmol/L (ref 3.5–5.3)
Sodium: 142 mmol/L (ref 135–146)
Total Bilirubin: 0.5 mg/dL (ref 0.2–1.2)
Total Protein: 6.4 g/dL (ref 6.1–8.1)

## 2020-10-20 LAB — CBC WITH DIFFERENTIAL/PLATELET
Absolute Monocytes: 596 cells/uL (ref 200–950)
Basophils Absolute: 43 cells/uL (ref 0–200)
Basophils Relative: 0.6 %
Eosinophils Absolute: 256 cells/uL (ref 15–500)
Eosinophils Relative: 3.6 %
HCT: 43 % (ref 38.5–50.0)
Hemoglobin: 15.1 g/dL (ref 13.2–17.1)
Lymphs Abs: 1924 cells/uL (ref 850–3900)
MCH: 33.7 pg — ABNORMAL HIGH (ref 27.0–33.0)
MCHC: 35.1 g/dL (ref 32.0–36.0)
MCV: 96 fL (ref 80.0–100.0)
MPV: 9.2 fL (ref 7.5–12.5)
Monocytes Relative: 8.4 %
Neutro Abs: 4281 cells/uL (ref 1500–7800)
Neutrophils Relative %: 60.3 %
Platelets: 171 10*3/uL (ref 140–400)
RBC: 4.48 10*6/uL (ref 4.20–5.80)
RDW: 13.1 % (ref 11.0–15.0)
Total Lymphocyte: 27.1 %
WBC: 7.1 10*3/uL (ref 3.8–10.8)

## 2020-10-20 LAB — TSH: TSH: 5.76 mIU/L — ABNORMAL HIGH (ref 0.40–4.50)

## 2020-10-20 LAB — LIPID PANEL
Cholesterol: 132 mg/dL (ref <200)
HDL: 41 mg/dL (ref >=40)
LDL Cholesterol (Calc): 67 mg/dL (calc)
Non-HDL Cholesterol (Calc): 91 mg/dL (calc) (ref <130)
Total CHOL/HDL Ratio: 3.2 (calc) (ref <5.0)
Triglycerides: 159 mg/dL — ABNORMAL HIGH (ref <150)

## 2020-10-25 ENCOUNTER — Other Ambulatory Visit: Payer: Self-pay

## 2020-10-25 ENCOUNTER — Non-Acute Institutional Stay: Payer: Medicare Other | Admitting: Internal Medicine

## 2020-10-25 ENCOUNTER — Encounter: Payer: Self-pay | Admitting: Internal Medicine

## 2020-10-25 VITALS — BP 140/95 | HR 56 | Temp 96.0°F | Ht 67.0 in | Wt 190.8 lb

## 2020-10-25 DIAGNOSIS — I480 Paroxysmal atrial fibrillation: Secondary | ICD-10-CM

## 2020-10-25 DIAGNOSIS — I10 Essential (primary) hypertension: Secondary | ICD-10-CM

## 2020-10-25 DIAGNOSIS — E039 Hypothyroidism, unspecified: Secondary | ICD-10-CM | POA: Diagnosis not present

## 2020-10-25 DIAGNOSIS — E782 Mixed hyperlipidemia: Secondary | ICD-10-CM

## 2020-10-25 DIAGNOSIS — N401 Enlarged prostate with lower urinary tract symptoms: Secondary | ICD-10-CM

## 2020-10-25 DIAGNOSIS — R350 Frequency of micturition: Secondary | ICD-10-CM

## 2020-10-25 DIAGNOSIS — N1831 Chronic kidney disease, stage 3a: Secondary | ICD-10-CM

## 2020-10-25 DIAGNOSIS — M19011 Primary osteoarthritis, right shoulder: Secondary | ICD-10-CM

## 2020-10-25 NOTE — Progress Notes (Signed)
Location:  [place] of Service:  Clinic (12)  Provider:   Code Status:  Goals of Care:  Advanced Directives 05/19/2019  Does Phillip [name] Have a Medical Advance Directive? No  Type of Advance Directive -  Does Phillip [name] want to make changes to medical advance directive? -  Copy of [place] in Chart? -  Would Phillip [name] like information on creating a medical advance directive? No - Phillip [name] declined     Chief Complaint  Phillip [name] presents with  . Medical Management of Chronic Issues    Phillip [name] returns to the clinic for follow up. He has no concerns.     HPI: Phillip [name] is a 86 y.o. male seen today for medical management of chronic diseases.    Right shoulder Pain Seen by Dr Onnie Graham. He suggested Arthroplasty due to sever osteoarthritis Phillip [name] has decided not to undergo surgery. Uses Tylenol as needed  Has missed few doses of his thyroid medicine Continues to have some caregiver stress due to his wife with dementia No other acute issues. Walks without any assist no falls recently  Past Medical History:  Diagnosis Date  . Atrial fibrillation ([place]) 03/26/2011  . Basal cell carcinoma of other specified sites of skin 4/10/012  . Basal cell carcinoma of skin, site unspecified 03/25/2012  . Benign neoplasm of colon 03/25/2012   adenomatous polyps  . Broken arm 11/25/2017  . Colon polyp 2014  . Degeneration of lumbar or lumbosacral intervertebral disc 09/24/2011  . Disturbance of skin sensation 01/28/2012  . Diverticulosis 2014  . Dysphagia, unspecified(787.20) 03/26/2011  . Elevated prostate specific antigen (PSA) 03/26/2011  . Exposure to unspecified radiation 03/26/2011  . Family history of colon cancer 07/16/2013  . GERD (gastroesophageal reflux disease) 07/16/2013  . Hearing deficit 12/21/2013  . Hemangioma of unspecified site 03/26/2011  . Hx of adenomatous colonic polyps 07/16/2013  . Hypertrophy of prostate with urinary obstruction and other lower urinary  tract symptoms (LUTS) 03/26/2011  . Ingrowing nail 03/26/2011  . Internal hemorrhoids without mention of complication 08/24/91  . Keratosis, actinic 01/28/2012  . Long term (current) use of anticoagulants 03/26/2011  . Long term current use of anticoagulant therapy 07/23/2016  . Lumbago 03/26/2011  . 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 Osteoarthrosis, unspecified whether generalized or localized, unspecified site 11/24/2012  . Other and unspecified hyperlipidemia 03/26/2011  . Other malaise and fatigue 03/26/2011  . Pain in joint, forearm 11/24/2012  . Pain in joint, lower leg 06/15/13   left knee  . Paresthesia of left arm 07/05/2014  . Rectal bleed 06/15/13  . Reflux esophagitis 03/26/2011  . Sacroiliitis, not elsewhere classified ([place]) 05/28/2011  . Scoliosis (and kyphoscoliosis), idiopathic 05/28/2011  . 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 Tinnitus 12/21/2013  . 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 Unspecified essential hypertension 03/26/2011    Past Surgical History:  Procedure Laterality Date  . BASAL CELL CARCINOMA EXCISION Left 12/10/14   Dr. Jarome Matin  . COLONOSCOPY  08/12/2013   Dr. Hilarie Fredrickson adenomatous polyps  . CRYOTHERAPY Left 2009   hand  . PHOTODYNAMIC THERAPY  09/26/14   face and ear lobs  Dr. Jarome Matin  . SQUAMOUS CELL CARCINOMA EXCISION Left 2010   hand    Allergies  Allergen Reactions  . Ciprofloxacin Other (See Comments)    Phillip [name] doesn't like the way it makes him feel  .  Zestril [Lisinopril]     unknown  . Sulfa Antibiotics Rash    Outpatient Encounter Medications as of 10/25/2020  Medication Sig  . acetaminophen (TYLENOL) 500 MG tablet Take 500 mg by mouth daily as needed. Take it twice a day 3 days a week  . amLODipine (NORVASC) 2.5 MG tablet TAKE 1 TABLET DAILY  . ELIQUIS 5 MG TABS tablet TAKE 1 TABLET TWICE A DAY FOR  ANTICOAGULATION  . famotidine (PEPCID) 20 MG tablet Take 20 mg by mouth as needed for heartburn or indigestion.  . finasteride (PROSCAR) 5 MG tablet TAKE 1 TABLET DAILY FOR URINATING DRIBBLING/PROSTATE ENLARGEMENT  . levothyroxine (SYNTHROID) 25 MCG tablet Take 1 tablet (25 mcg total) by mouth daily before breakfast.  . meloxicam (MOBIC) 15 MG tablet Take 1 tablet (15 mg total) by mouth daily as needed for pain.  . metoprolol succinate (TOPROL XL) 50 MG 24 hr tablet Take one tablet by mouth twice daily to control blood pressure and heart rhythm. Take with or immediately following a meal.  . simvastatin (ZOCOR) 10 MG tablet TAKE 1 TABLET DAILY TO LOWER CHOLESTEROL  . terazosin (HYTRIN) 2 MG capsule TAKE 1 CAPSULE DAILY  . valsartan (DIOVAN) 160 MG tablet TAKE 1 TABLET DAILY   No facility-administered encounter medications on file as of 10/25/2020.    Review of Systems:  Review of Systems  Constitutional: Positive for activity change and unexpected weight change.  HENT: Negative.   Respiratory: Negative.   Cardiovascular: Negative.   Gastrointestinal: Negative.   Genitourinary: Negative.   Musculoskeletal: Positive for arthralgias and myalgias.  Skin: Negative.   Neurological: Negative.   Psychiatric/Behavioral: Negative.     Health Maintenance  Topic Date Due  . TETANUS/TDAP  09/04/2026  . INFLUENZA VACCINE  Completed  . COVID-19 Vaccine  Completed  . PNA vac Low Risk Adult  Completed  . COLONOSCOPY  Discontinued    Physical Exam: Vitals:   10/25/20 1301  BP: (!) 140/95  Pulse: (!) 56  Temp: (!) 96 F (35.6 C)  SpO2: 99%  Weight: 190 lb 12.8 oz (86.5 kg)  Height: 5\' 7"  (1.702 m)   Body mass index is 29.88 kg/m. Physical Exam Constitutional: Oriented to person, place, and time. Well-developed and well-nourished.  HENT:  Head: Normocephalic. Ears TM clear Very Hard of hearing Mouth/Throat: Oropharynx is clear and moist.  Eyes: Pupils are equal, round, and reactive  to light.  Neck: Neck supple.  Cardiovascular: Normal rate and normal heart sounds.  No murmur heard. Pulmonary/Chest: Effort normal and breath sounds normal. No respiratory distress. No wheezes.  has no rales.  Abdominal: Soft. Bowel sounds are normal. No distension. There is no tenderness. There is no rebound.  Musculoskeletal: No edema.  Lymphadenopathy: none Neurological: Alert and oriented to person, place, and time.  Skin: Skin is warm and dry.  Psychiatric: Normal mood and affect. Behavior is normal. Thought content normal.   Labs reviewed: Basic Metabolic Panel: Recent Labs    11/22/19 0800 11/22/19 0800 01/19/20 0925 02/17/20 0745 10/19/20 0800  NA 142   < > 142 142 142  K 3.9   < > 3.9 4.2 3.9  CL 107   < > 108 107 107  CO2 26   < > 26 27 23   GLUCOSE 97   < > 96 87 85  BUN 16   < > 14 13 15   CREATININE 1.16*   < > 1.09 1.06 1.19*  CALCIUM 9.0   < > 9.1 9.3  9.3  TSH 7.94*  --   --  4.14 5.76*   < > = values in this interval not displayed.   Liver Function Tests: Recent Labs    01/19/20 0925 02/17/20 0745 10/19/20 0800  AST 18 19 18   ALT 10 10 9   BILITOT 0.7 0.7 0.5  PROT 6.4 6.2 6.4   No results for input(s): LIPASE, AMYLASE in the last 8760 hours. No results for input(s): AMMONIA in the last 8760 hours. CBC: Recent Labs    11/22/19 0800 02/17/20 0745 10/19/20 0800  WBC 7.0 6.9 7.1  NEUTROABS 4,018 3,830 4,281  HGB 14.8 14.8 15.1  HCT 43.3 43.1 43.0  MCV 94.5 94.9 96.0  PLT 196 183 171   Lipid Panel: Recent Labs    11/22/19 0800 10/19/20 0800  CHOL 123 132  HDL 42 41  LDLCALC 60 67  TRIG 127 159*  CHOLHDL 2.9 3.2   No results found for: HGBA1C  Procedures since last visit: No results found.  Assessment/Plan Osteoarthritis of right shoulder, unspecified osteoarthritis type Does not want surgery Take Tylenol PRN Hypothyroidism, unspecified type - Plan: TSH Missing doses  TSh mildily high Told he can take it at night 4 hours after  eating supper  Paroxysmal atrial fibrillation ([place]) - Plan: CBC with Differential/Platelet Follows with Cardiology On Toprol and Eliquis  Essential hypertension - Plan: COMPLETE METABOLIC PANEL WITH GFR, CBC with Differential/Platelet Midily high today. On Norvasc, Diovan and Toprol  Will monitor his BP at home and will bring it when comes with his wife  Stage 3a chronic kidney disease ([place]) Creat Stable Avoid NSAIDS Mixed hyperlipidemia - Plan: Lipid panel LDL less then 100 On Statin  Benign prostatic hyperplasia with urinary frequency On proscar and Hytrin Visual Floaters Was seen by ophthalmologist  Had laser treatment  Shingrix in 4/19- 7/19  Labs/tests ordered:  * No order type specified * Next appt:  Visit date not found

## 2020-10-25 NOTE — Patient Instructions (Signed)
Can take Synthroid at night 4 hours after supper Tylenol for pain Avoid NSAIDS

## 2020-10-30 DIAGNOSIS — Z23 Encounter for immunization: Secondary | ICD-10-CM | POA: Diagnosis not present

## 2020-11-01 ENCOUNTER — Other Ambulatory Visit: Payer: Self-pay | Admitting: Internal Medicine

## 2021-01-10 ENCOUNTER — Encounter: Payer: Self-pay | Admitting: Internal Medicine

## 2021-01-10 ENCOUNTER — Other Ambulatory Visit: Payer: Self-pay

## 2021-01-10 ENCOUNTER — Non-Acute Institutional Stay: Payer: Medicare Other | Admitting: Internal Medicine

## 2021-01-10 VITALS — BP 150/80 | HR 74 | Temp 96.9°F | Ht 67.0 in | Wt 192.7 lb

## 2021-01-10 DIAGNOSIS — I480 Paroxysmal atrial fibrillation: Secondary | ICD-10-CM | POA: Diagnosis not present

## 2021-01-10 DIAGNOSIS — N1831 Chronic kidney disease, stage 3a: Secondary | ICD-10-CM

## 2021-01-10 DIAGNOSIS — M19011 Primary osteoarthritis, right shoulder: Secondary | ICD-10-CM

## 2021-01-10 DIAGNOSIS — I10 Essential (primary) hypertension: Secondary | ICD-10-CM

## 2021-01-10 DIAGNOSIS — E039 Hypothyroidism, unspecified: Secondary | ICD-10-CM

## 2021-01-10 DIAGNOSIS — R350 Frequency of micturition: Secondary | ICD-10-CM | POA: Diagnosis not present

## 2021-01-10 DIAGNOSIS — N401 Enlarged prostate with lower urinary tract symptoms: Secondary | ICD-10-CM | POA: Diagnosis not present

## 2021-01-10 DIAGNOSIS — E782 Mixed hyperlipidemia: Secondary | ICD-10-CM | POA: Diagnosis not present

## 2021-01-10 MED ORDER — LEVOTHYROXINE SODIUM 25 MCG PO TABS
25.0000 ug | ORAL_TABLET | Freq: Every day | ORAL | 3 refills | Status: DC
Start: 2021-01-10 — End: 2022-01-07

## 2021-01-10 NOTE — Progress Notes (Signed)
Location: [place] of Service:  Clinic (12)  Provider:   Code Status:  Goals of Care:  Advanced Directives 05/19/2019  Does Patient Have a Medical Advance Directive? No  Type of Advance Directive -  Does patient want to make changes to medical advance directive? -  Copy of [place] in Chart? -  Would patient like information on creating a medical advance directive? No - Patient declined     Chief Complaint  Patient presents with  . Medical Management of Chronic Issues    Patient returns to the clinic for follow up    HPI: Patient is a 87 y.o. male seen today for an acute visit for [facility] Pressure Follow up  Patient has h/o Hypertension , Atrial Fibrillation, And Right Shoulder Arthritis  Came for follow up of his BP He has been checking it at home every week with Facility Nurse and it has been around 138-140 /80-90. No Other issues. Some stress due to his wife with dementia   Past Medical History:  Diagnosis Date  . Atrial fibrillation ([place]) 03/26/2011  . Basal cell carcinoma of other specified sites of skin 4/10/012  . Basal cell carcinoma of skin, site unspecified 03/25/2012  . Benign neoplasm of colon 03/25/2012   adenomatous polyps  . Broken arm 11/25/2017  . Colon polyp 2014  . Degeneration of lumbar or lumbosacral intervertebral disc 09/24/2011  . Disturbance of skin sensation 01/28/2012  . Diverticulosis 2014  . Dysphagia, unspecified(787.20) 03/26/2011  . Elevated prostate specific antigen (PSA) 03/26/2011  . Exposure to unspecified radiation 03/26/2011  . Family history of colon cancer 07/16/2013  . GERD (gastroesophageal reflux disease) 07/16/2013  . Hearing deficit 12/21/2013  . Hemangioma of unspecified site 03/26/2011  . Hx of adenomatous colonic polyps 07/16/2013  . Hypertrophy of prostate with urinary obstruction and other lower urinary tract symptoms (LUTS) 03/26/2011  . Ingrowing nail 03/26/2011  . Internal hemorrhoids without  mention of complication 08/18/78  . Keratosis, actinic 01/28/2012  . Long term (current) use of anticoagulants 03/26/2011  . Long term current use of anticoagulant therapy 07/23/2016  . Lumbago 03/26/2011  . 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 Osteoarthrosis, unspecified whether generalized or localized, unspecified site 11/24/2012  . Other and unspecified hyperlipidemia 03/26/2011  . Other malaise and fatigue 03/26/2011  . Pain in joint, forearm 11/24/2012  . Pain in joint, lower leg 06/15/13   left knee  . Paresthesia of left arm 07/05/2014  . Rectal bleed 06/15/13  . Reflux esophagitis 03/26/2011  . Sacroiliitis, not elsewhere classified ([place]) 05/28/2011  . Scoliosis (and kyphoscoliosis), idiopathic 05/28/2011  . 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 Tinnitus 12/21/2013  . 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 Unspecified essential hypertension 03/26/2011    Past Surgical History:  Procedure Laterality Date  . BASAL CELL CARCINOMA EXCISION Left 12/10/14   Dr. Jarome Matin  . COLONOSCOPY  08/12/2013   Dr. Hilarie Fredrickson adenomatous polyps  . CRYOTHERAPY Left 2009   hand  . PHOTODYNAMIC THERAPY  09/26/14   face and ear lobs  Dr. Jarome Matin  . SQUAMOUS CELL CARCINOMA EXCISION Left 2010   hand    Allergies  Allergen Reactions  . Ciprofloxacin Other (See Comments)    Patient doesn't like the way it makes him feel  . Zestril [Lisinopril]     unknown  . Sulfa Antibiotics  Rash    Outpatient Encounter Medications as of 01/10/2021  Medication Sig  . acetaminophen (TYLENOL) 500 MG tablet Take 500 mg by mouth daily as needed. Take it twice a day 3 days a week  . amLODipine (NORVASC) 2.5 MG tablet TAKE 1 TABLET DAILY  . ELIQUIS 5 MG TABS tablet TAKE 1 TABLET TWICE A DAY FOR ANTICOAGULATION  . famotidine (PEPCID) 20 MG tablet Take 20 mg by mouth as needed for heartburn or  indigestion.  . finasteride (PROSCAR) 5 MG tablet TAKE 1 TABLET DAILY FOR URINATING DRIBBLING/PROSTATE ENLARGEMENT  . metoprolol succinate (TOPROL-XL) 50 MG 24 hr tablet TAKE 1 TABLET TWICE A DAY   WITH OR IMMEDIATELY FOLLOWING A MEAL TO CONTROL BLOOD PRESSURE AND HEART RHYTHM  . simvastatin (ZOCOR) 10 MG tablet TAKE 1 TABLET DAILY TO LOWER CHOLESTEROL  . terazosin (HYTRIN) 2 MG capsule TAKE 1 CAPSULE DAILY  . valsartan (DIOVAN) 160 MG tablet TAKE 1 TABLET DAILY  . [DISCONTINUED] levothyroxine (SYNTHROID) 25 MCG tablet Take 1 tablet (25 mcg total) by mouth daily before breakfast.  . levothyroxine (SYNTHROID) 25 MCG tablet Take 1 tablet (25 mcg total) by mouth daily before breakfast.   No facility-administered encounter medications on file as of 01/10/2021.    Review of Systems:  Review of Systems  Review of Systems  Constitutional: Negative for activity change, appetite change, chills, diaphoresis, fatigue and fever.  HENT: Negative for mouth sores, postnasal drip, rhinorrhea, sinus pain and sore throat.   Respiratory: Negative for apnea, cough, chest tightness, shortness of breath and wheezing.   Cardiovascular: Negative for chest pain, palpitations and leg swelling.  Gastrointestinal: Negative for abdominal distention, abdominal pain, constipation, diarrhea, nausea and vomiting.  Genitourinary: Negative for dysuria and frequency.  Musculoskeletal: Negative for arthralgias, joint swelling and myalgias. Positive for Right shoulder pain Skin: Negative for rash.  Neurological: Negative for dizziness, syncope, weakness, light-headedness and numbness.  Psychiatric/Behavioral: Negative for behavioral problems, confusion and sleep disturbance.     Health Maintenance  Topic Date Due  . COVID-19 Vaccine (4 - Booster for Moderna series) 04/29/2021  . TETANUS/TDAP  09/04/2026  . INFLUENZA VACCINE  Completed  . PNA vac Low Risk Adult  Completed  . COLONOSCOPY (Pts 45-19yrs Insurance coverage  will need to be confirmed)  Discontinued    Physical Exam: Vitals:   01/10/21 1334  BP: (!) 156/98  Pulse: 74  Temp: (!) 96.9 F (36.1 C)  SpO2: 99%  Weight: 192 lb 11.2 oz (87.4 kg)  Height: 5\' 7"  (1.702 m)   Body mass index is 30.18 kg/m. Physical Exam  Constitutional: Oriented to person, place, and time. Well-developed and well-nourished.  HENT:  Head: Normocephalic.  Mouth/Throat: Oropharynx is clear and moist.  Eyes: Pupils are equal, round, and reactive to light.  Neck: Neck supple.  Cardiovascular: Normal rate and normal heart sounds.  No murmur heard. Pulmonary/Chest: Effort normal and breath sounds normal. No respiratory distress. No wheezes. She has no rales.  Abdominal: Soft. Bowel sounds are normal. No distension. There is no tenderness. There is no rebound.  Musculoskeletal: No edema.  Lymphadenopathy: none Neurological: Alert and oriented to person, place, and time.  Skin: Skin is warm and dry.  Psychiatric: Normal mood and affect. Behavior is normal. Thought content normal.     Labs reviewed: Basic Metabolic Panel: Recent Labs    01/19/20 0925 02/17/20 0745 10/19/20 0800  NA 142 142 142  K 3.9 4.2 3.9  CL 108 107 107  CO2 26 27 23  GLUCOSE 96 87 85  BUN 14 13 15   CREATININE 1.09 1.06 1.19*  CALCIUM 9.1 9.3 9.3  TSH  --  4.14 5.76*   Liver Function Tests: Recent Labs    01/19/20 0925 02/17/20 0745 10/19/20 0800  AST 18 19 18   ALT 10 10 9   BILITOT 0.7 0.7 0.5  PROT 6.4 6.2 6.4   No results for input(s): LIPASE, AMYLASE in the last 8760 hours. No results for input(s): AMMONIA in the last 8760 hours. CBC: Recent Labs    02/17/20 0745 10/19/20 0800  WBC 6.9 7.1  NEUTROABS 3,830 4,281  HGB 14.8 15.1  HCT 43.1 43.0  MCV 94.9 96.0  PLT 183 171   Lipid Panel: Recent Labs    10/19/20 0800  CHOL 132  HDL 41  LDLCALC 67  TRIG 159*  CHOLHDL 3.2   No results found for: HGBA1C  Procedures since last visit: No results  found.  Assessment/Plan 1. Essential hypertension BP at home is good Will not make any change right now He will call us if SBP more then 150 Did recommend Cutting back his salt intake and exercising  2. Osteoarthritis of right shoulder, unspecified osteoarthritis type Takes Tylenol prn  3. Hypothyroidism, unspecified type TSH due next visit  4. Paroxysmal atrial fibrillation ([facility]) Toprol and Eliquis Follows with Cardiology  5. Stage 3a chronic kidney disease ([place]) Repeat BMP pending Avoid NSAIDS 6. Mixed hyperlipidemia On Statin  7. Benign prostatic hyperplasia with urinary frequency Proscar and Hytrin  Visual Floaters Was seen by ophthalmologist  Had laser treatment  Shingrix in 4/19- 7/19 Labs/tests ordered:  * No order type specified * Next appt:  04/19/2021

## 2021-01-10 NOTE — Patient Instructions (Addendum)
Call our Office if your BP is above 150/90

## 2021-02-14 DIAGNOSIS — L720 Epidermal cyst: Secondary | ICD-10-CM | POA: Diagnosis not present

## 2021-02-14 DIAGNOSIS — L821 Other seborrheic keratosis: Secondary | ICD-10-CM | POA: Diagnosis not present

## 2021-02-14 DIAGNOSIS — L570 Actinic keratosis: Secondary | ICD-10-CM | POA: Diagnosis not present

## 2021-02-14 DIAGNOSIS — Z85828 Personal history of other malignant neoplasm of skin: Secondary | ICD-10-CM | POA: Diagnosis not present

## 2021-02-14 DIAGNOSIS — R208 Other disturbances of skin sensation: Secondary | ICD-10-CM | POA: Diagnosis not present

## 2021-02-14 DIAGNOSIS — L812 Freckles: Secondary | ICD-10-CM | POA: Diagnosis not present

## 2021-02-14 DIAGNOSIS — L820 Inflamed seborrheic keratosis: Secondary | ICD-10-CM | POA: Diagnosis not present

## 2021-03-19 ENCOUNTER — Other Ambulatory Visit: Payer: Self-pay | Admitting: Internal Medicine

## 2021-03-30 ENCOUNTER — Other Ambulatory Visit: Payer: Self-pay | Admitting: Internal Medicine

## 2021-04-19 ENCOUNTER — Other Ambulatory Visit: Payer: Self-pay

## 2021-04-19 DIAGNOSIS — E039 Hypothyroidism, unspecified: Secondary | ICD-10-CM

## 2021-04-19 DIAGNOSIS — I10 Essential (primary) hypertension: Secondary | ICD-10-CM | POA: Diagnosis not present

## 2021-04-19 DIAGNOSIS — E782 Mixed hyperlipidemia: Secondary | ICD-10-CM

## 2021-04-20 LAB — COMPLETE METABOLIC PANEL WITH GFR
AG Ratio: 1.5 (calc) (ref 1.0–2.5)
ALT: 7 U/L — ABNORMAL LOW (ref 9–46)
AST: 15 U/L (ref 10–35)
Albumin: 3.8 g/dL (ref 3.6–5.1)
Alkaline phosphatase (APISO): 75 U/L (ref 35–144)
BUN/Creatinine Ratio: 13 (calc) (ref 6–22)
BUN: 15 mg/dL (ref 7–25)
CO2: 25 mmol/L (ref 20–32)
Calcium: 9.1 mg/dL (ref 8.6–10.3)
Chloride: 107 mmol/L (ref 98–110)
Creat: 1.14 mg/dL — ABNORMAL HIGH (ref 0.70–1.11)
GFR, Est African American: 65 mL/min/{1.73_m2} (ref >=60)
GFR, Est Non African American: 56 mL/min/{1.73_m2} — ABNORMAL LOW (ref >=60)
Globulin: 2.5 g/dL (calc) (ref 1.9–3.7)
Glucose, Bld: 79 mg/dL (ref 65–99)
Potassium: 4 mmol/L (ref 3.5–5.3)
Sodium: 141 mmol/L (ref 135–146)
Total Bilirubin: 1 mg/dL (ref 0.2–1.2)
Total Protein: 6.3 g/dL (ref 6.1–8.1)

## 2021-04-20 LAB — LIPID PANEL
Cholesterol: 133 mg/dL (ref <200)
HDL: 46 mg/dL (ref >=40)
LDL Cholesterol (Calc): 69 mg/dL (calc)
Non-HDL Cholesterol (Calc): 87 mg/dL (calc) (ref <130)
Total CHOL/HDL Ratio: 2.9 (calc) (ref <5.0)
Triglycerides: 96 mg/dL (ref <150)

## 2021-04-20 LAB — TSH: TSH: 3.26 mIU/L (ref 0.40–4.50)

## 2021-04-25 ENCOUNTER — Non-Acute Institutional Stay: Payer: Medicare Other | Admitting: Internal Medicine

## 2021-04-25 ENCOUNTER — Other Ambulatory Visit: Payer: Self-pay

## 2021-04-25 ENCOUNTER — Encounter: Payer: Self-pay | Admitting: Internal Medicine

## 2021-04-25 VITALS — BP 152/100 | HR 71 | Temp 96.6°F | Ht 67.0 in | Wt 187.5 lb

## 2021-04-25 DIAGNOSIS — E782 Mixed hyperlipidemia: Secondary | ICD-10-CM

## 2021-04-25 DIAGNOSIS — I10 Essential (primary) hypertension: Secondary | ICD-10-CM

## 2021-04-25 DIAGNOSIS — N1831 Chronic kidney disease, stage 3a: Secondary | ICD-10-CM

## 2021-04-25 DIAGNOSIS — M19011 Primary osteoarthritis, right shoulder: Secondary | ICD-10-CM

## 2021-04-25 DIAGNOSIS — I480 Paroxysmal atrial fibrillation: Secondary | ICD-10-CM | POA: Diagnosis not present

## 2021-04-25 DIAGNOSIS — R350 Frequency of micturition: Secondary | ICD-10-CM

## 2021-04-25 DIAGNOSIS — Z636 Dependent relative needing care at home: Secondary | ICD-10-CM | POA: Diagnosis not present

## 2021-04-25 DIAGNOSIS — E039 Hypothyroidism, unspecified: Secondary | ICD-10-CM

## 2021-04-25 DIAGNOSIS — N401 Enlarged prostate with lower urinary tract symptoms: Secondary | ICD-10-CM

## 2021-04-25 MED ORDER — AMLODIPINE BESYLATE 5 MG PO TABS: 5.0000 mg | ORAL_TABLET | Freq: Every day | ORAL | 6 refills | Status: DC

## 2021-04-25 NOTE — Patient Instructions (Signed)
Check BP Every 2 weeks Increasing the Norvasc to 5 mg Every day

## 2021-04-26 ENCOUNTER — Other Ambulatory Visit: Payer: Self-pay | Admitting: Internal Medicine

## 2021-04-26 NOTE — Progress Notes (Signed)
Location:  [place] of Service:  Clinic (12)  Provider:   Code Status:  Goals of Care:  Advanced Directives 05/19/2019  Does Patient Have a Medical Advance Directive? No  Type of Advance Directive -  Does patient want to make changes to medical advance directive? -  Copy of [place] in Chart? -  Would patient like information on creating a medical advance directive? No - Patient declined     Chief Complaint  Patient presents with  . Medical Management of Chronic Issues    Patient here today for follow up.    HPI: Patient is a 87 y.o. male seen today for medical management of chronic diseases.    Patient has h/o Hypertension , Atrial Fibrillation, And Right Shoulder Arthritis  Doing well. Had no Complains Most of his questions were about his Wife who has now moved to Memory unit due to Exit seeking He continues to do well. No Falls. Walks well. Gait stable BP running 140-155 and 90  Past Medical History:  Diagnosis Date  . Atrial fibrillation ([place]) 03/26/2011  . Basal cell carcinoma of other specified sites of skin 4/10/012  . Basal cell carcinoma of skin, site unspecified 03/25/2012  . Benign neoplasm of colon 03/25/2012   adenomatous polyps  . Broken arm 11/25/2017  . Colon polyp 2014  . Degeneration of lumbar or lumbosacral intervertebral disc 09/24/2011  . Disturbance of skin sensation 01/28/2012  . Diverticulosis 2014  . Dysphagia, unspecified(787.20) 03/26/2011  . Elevated prostate specific antigen (PSA) 03/26/2011  . Exposure to unspecified radiation 03/26/2011  . Family history of colon cancer 07/16/2013  . GERD (gastroesophageal reflux disease) 07/16/2013  . Hearing deficit 12/21/2013  . Hemangioma of unspecified site 03/26/2011  . Hx of adenomatous colonic polyps 07/16/2013  . Hypertrophy of prostate with urinary obstruction and other lower urinary tract symptoms (LUTS) 03/26/2011  . Ingrowing nail 03/26/2011  . Internal  hemorrhoids without mention of complication 03/19/80  . Keratosis, actinic 01/28/2012  . Long term (current) use of anticoagulants 03/26/2011  . Long term current use of anticoagulant therapy 07/23/2016  . Lumbago 03/26/2011  . 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 Osteoarthrosis, unspecified whether generalized or localized, unspecified site 11/24/2012  . Other and unspecified hyperlipidemia 03/26/2011  . Other malaise and fatigue 03/26/2011  . Pain in joint, forearm 11/24/2012  . Pain in joint, lower leg 06/15/13   left knee  . Paresthesia of left arm 07/05/2014  . Rectal bleed 06/15/13  . Reflux esophagitis 03/26/2011  . Sacroiliitis, not elsewhere classified ([place]) 05/28/2011  . Scoliosis (and kyphoscoliosis), idiopathic 05/28/2011  . 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 Tinnitus 12/21/2013  . 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 Unspecified essential hypertension 03/26/2011    Past Surgical History:  Procedure Laterality Date  . BASAL CELL CARCINOMA EXCISION Left 12/10/14   Dr. Jarome Matin  . COLONOSCOPY  08/12/2013   Dr. Hilarie Fredrickson adenomatous polyps  . CRYOTHERAPY Left 2009   hand  . PHOTODYNAMIC THERAPY  09/26/14   face and ear lobs  Dr. Jarome Matin  . SQUAMOUS CELL CARCINOMA EXCISION Left 2010   hand    Allergies  Allergen Reactions  . Ciprofloxacin Other (See Comments)    Patient doesn't like the way it makes him feel  . Zestril [Lisinopril]     unknown  . Sulfa Antibiotics  Rash    Outpatient Encounter Medications as of 04/25/2021  Medication Sig  . acetaminophen (TYLENOL) 500 MG tablet Take 500 mg by mouth daily as needed. Take it twice a day 3 days a week  . amLODipine (NORVASC) 5 MG tablet Take 1 tablet (5 mg total) by mouth daily.  Marland Kitchen ELIQUIS 5 MG TABS tablet TAKE 1 TABLET TWICE A DAY FOR ANTICOAGULATION  . famotidine (PEPCID) 20 MG tablet Take 20  mg by mouth as needed for heartburn or indigestion.  . finasteride (PROSCAR) 5 MG tablet TAKE 1 TABLET DAILY FOR URINATING DRIBBLING/PROSTATE ENLARGEMENT  . levothyroxine (SYNTHROID) 25 MCG tablet Take 1 tablet (25 mcg total) by mouth daily before breakfast.  . metoprolol succinate (TOPROL-XL) 50 MG 24 hr tablet TAKE 1 TABLET TWICE A DAY   WITH OR IMMEDIATELY FOLLOWING A MEAL TO CONTROL BLOOD PRESSURE AND HEART RHYTHM  . simvastatin (ZOCOR) 10 MG tablet TAKE 1 TABLET DAILY TO LOWER CHOLESTEROL  . terazosin (HYTRIN) 2 MG capsule TAKE 1 CAPSULE DAILY  . valsartan (DIOVAN) 160 MG tablet TAKE 1 TABLET DAILY  . [DISCONTINUED] amLODipine (NORVASC) 2.5 MG tablet TAKE 1 TABLET DAILY   No facility-administered encounter medications on file as of 04/25/2021.    Review of Systems:  Review of Systems  Review of Systems  Constitutional: Negative for activity change, appetite change, chills, diaphoresis, fatigue and fever.  HENT: Negative for mouth sores, postnasal drip, rhinorrhea, sinus pain and sore throat.   Respiratory: Negative for apnea, cough, chest tightness, shortness of breath and wheezing.   Cardiovascular: Negative for chest pain, palpitations and leg swelling.  Gastrointestinal: Negative for abdominal distention, abdominal pain, constipation, diarrhea, nausea and vomiting.  Genitourinary: Negative for dysuria and frequency.  Musculoskeletal: Negative for arthralgias, joint swelling and myalgias.  Skin: Negative for rash.  Neurological: Negative for dizziness, syncope, weakness, light-headedness and numbness.  Psychiatric/Behavioral: Negative for behavioral problems, confusion and sleep disturbance.     Health Maintenance  Topic Date Due  . COVID-19 Vaccine (4 - Booster for Moderna series) 01/30/2021  . INFLUENZA VACCINE  07/16/2021  . TETANUS/TDAP  09/04/2026  . PNA vac Low Risk Adult  Completed  . HPV VACCINES  Aged Out  . COLONOSCOPY (Pts 45-31yrs Insurance coverage will need to  be confirmed)  Discontinued    Physical Exam: Vitals:   04/25/21 1335  BP: (!) 152/100  Pulse: 71  Temp: (!) 96.6 F (35.9 C)  SpO2: 98%  Weight: 187 lb 8 oz (85 kg)  Height: 5\' 7"  (1.702 m)   Body mass index is 29.37 kg/m. Physical Exam Constitutional: Oriented to person, place, and time. Well-developed and well-nourished.  HENT:  Head: Normocephalic.  Mouth/Throat: Oropharynx is clear and moist.  Eyes: Pupils are equal, round, and reactive to light.  Neck: Neck supple.  Cardiovascular: Normal rate and normal heart sounds.  No murmur heard. Pulmonary/Chest: Effort normal and breath sounds normal. No respiratory distress. No wheezes. He has no rales.  Abdominal: Soft. Bowel sounds are normal. No distension. There is no tenderness. There is no rebound.  Musculoskeletal: No edema.  Lymphadenopathy: none Neurological: Alert and oriented to person, place, and time.  Skin: Skin is warm and dry.  Psychiatric: Normal mood and affect. Behavior is normal. Thought content normal.   Labs reviewed: Basic Metabolic Panel: Recent Labs    10/19/20 0800 04/19/21 0000  NA 142 141  K 3.9 4.0  CL 107 107  CO2 23 25  GLUCOSE 85 79  BUN 15 15  CREATININE 1.19* 1.14*  CALCIUM 9.3 9.1  TSH 5.76* 3.26   Liver Function Tests: Recent Labs    10/19/20 0800 04/19/21 0000  AST 18 15  ALT 9 7*  BILITOT 0.5 1.0  PROT 6.4 6.3   No results for input(s): LIPASE, AMYLASE in the last 8760 hours. No results for input(s): AMMONIA in the last 8760 hours. CBC: Recent Labs    10/19/20 0800  WBC 7.1  NEUTROABS 4,281  HGB 15.1  HCT 43.0  MCV 96.0  PLT 171   Lipid Panel: Recent Labs    10/19/20 0800 04/19/21 0000  CHOL 132 133  HDL 41 46  LDLCALC 67 69  TRIG 159* 96  CHOLHDL 3.2 2.9   No results found for: HGBA1C  Procedures since last visit: No results found.  Assessment/Plan 1. Essential hypertension Changed Norvasc to 5 mg Also on Diovan  2. Osteoarthritis of right  shoulder, unspecified osteoarthritis type Tylenol PRN Refused further work up   3. Hypothyroidism, unspecified type TSH good range  4. Paroxysmal atrial fibrillation [facility]) Sees cardiology On Eliquis   5. Stage 3a chronic kidney disease (HCC) Creat stable  6. Mixed hyperlipidemia On Zocor  7. Benign prostatic hyperplasia with urinary frequency Symptoms controlled on Proscar and Hytrin  8. Caregiver stress Denies any Depression    Labs/tests ordered:  * No order type specified * Next appt:  04/26/2021

## 2021-05-23 DIAGNOSIS — Z23 Encounter for immunization: Secondary | ICD-10-CM | POA: Diagnosis not present

## 2021-06-12 ENCOUNTER — Other Ambulatory Visit: Payer: Self-pay | Admitting: Internal Medicine

## 2021-06-12 NOTE — Telephone Encounter (Signed)
Patient has request refill on medication "Terazosin 2mg ". Medication last refill was 06/20/2020. Medication has warnings. Medication pend and sent to [name] Man, NP due to PCP Virgie Dad, MD being out of office. Please Advise.

## 2021-06-25 ENCOUNTER — Other Ambulatory Visit: Payer: Self-pay | Admitting: Internal Medicine

## 2021-08-21 DIAGNOSIS — L821 Other seborrheic keratosis: Secondary | ICD-10-CM | POA: Diagnosis not present

## 2021-08-21 DIAGNOSIS — Z85828 Personal history of other malignant neoplasm of skin: Secondary | ICD-10-CM | POA: Diagnosis not present

## 2021-08-21 DIAGNOSIS — L570 Actinic keratosis: Secondary | ICD-10-CM | POA: Diagnosis not present

## 2021-08-21 DIAGNOSIS — L812 Freckles: Secondary | ICD-10-CM | POA: Diagnosis not present

## 2021-09-13 ENCOUNTER — Other Ambulatory Visit: Payer: Self-pay | Admitting: Internal Medicine

## 2021-09-20 DIAGNOSIS — Z23 Encounter for immunization: Secondary | ICD-10-CM | POA: Diagnosis not present

## 2021-10-08 ENCOUNTER — Other Ambulatory Visit: Payer: Self-pay | Admitting: Internal Medicine

## 2021-10-17 DIAGNOSIS — E039 Hypothyroidism, unspecified: Secondary | ICD-10-CM | POA: Diagnosis not present

## 2021-10-17 DIAGNOSIS — I10 Essential (primary) hypertension: Secondary | ICD-10-CM | POA: Diagnosis not present

## 2021-10-18 ENCOUNTER — Other Ambulatory Visit: Payer: Self-pay

## 2021-10-18 DIAGNOSIS — I10 Essential (primary) hypertension: Secondary | ICD-10-CM

## 2021-10-18 DIAGNOSIS — E039 Hypothyroidism, unspecified: Secondary | ICD-10-CM

## 2021-10-18 LAB — COMPLETE METABOLIC PANEL WITH GFR
AG Ratio: 1.6 (calc) (ref 1.0–2.5)
ALT: 10 U/L (ref 9–46)
AST: 16 U/L (ref 10–35)
Albumin: 3.9 g/dL (ref 3.6–5.1)
Alkaline phosphatase (APISO): 81 U/L (ref 35–144)
BUN: 14 mg/dL (ref 7–25)
CO2: 26 mmol/L (ref 20–32)
Calcium: 9 mg/dL (ref 8.6–10.3)
Chloride: 108 mmol/L (ref 98–110)
Creat: 1.18 mg/dL (ref 0.70–1.22)
Globulin: 2.5 g/dL (calc) (ref 1.9–3.7)
Glucose, Bld: 97 mg/dL (ref 65–99)
Potassium: 3.8 mmol/L (ref 3.5–5.3)
Sodium: 141 mmol/L (ref 135–146)
Total Bilirubin: 0.8 mg/dL (ref 0.2–1.2)
Total Protein: 6.4 g/dL (ref 6.1–8.1)
eGFR: 59 mL/min/{1.73_m2} — ABNORMAL LOW (ref >=60)

## 2021-10-18 LAB — LIPID PANEL
Cholesterol: 117 mg/dL (ref <200)
HDL: 43 mg/dL (ref >=40)
LDL Cholesterol (Calc): 54 mg/dL (calc)
Non-HDL Cholesterol (Calc): 74 mg/dL (calc) (ref <130)
Total CHOL/HDL Ratio: 2.7 (calc) (ref <5.0)
Triglycerides: 116 mg/dL (ref <150)

## 2021-10-18 LAB — CBC WITH DIFFERENTIAL/PLATELET
Absolute Monocytes: 639 cells/uL (ref 200–950)
Basophils Absolute: 28 cells/uL (ref 0–200)
Basophils Relative: 0.4 %
Eosinophils Absolute: 341 cells/uL (ref 15–500)
Eosinophils Relative: 4.8 %
HCT: 43.3 % (ref 38.5–50.0)
Hemoglobin: 15 g/dL (ref 13.2–17.1)
Lymphs Abs: 1896 cells/uL (ref 850–3900)
MCH: 33.3 pg — ABNORMAL HIGH (ref 27.0–33.0)
MCHC: 34.6 g/dL (ref 32.0–36.0)
MCV: 96.2 fL (ref 80.0–100.0)
MPV: 9.1 fL (ref 7.5–12.5)
Monocytes Relative: 9 %
Neutro Abs: 4196 cells/uL (ref 1500–7800)
Neutrophils Relative %: 59.1 %
Platelets: 185 10*3/uL (ref 140–400)
RBC: 4.5 10*6/uL (ref 4.20–5.80)
RDW: 12.8 % (ref 11.0–15.0)
Total Lymphocyte: 26.7 %
WBC: 7.1 10*3/uL (ref 3.8–10.8)

## 2021-10-24 ENCOUNTER — Other Ambulatory Visit: Payer: Self-pay

## 2021-10-24 ENCOUNTER — Non-Acute Institutional Stay: Payer: Medicare Other | Admitting: Internal Medicine

## 2021-10-24 ENCOUNTER — Encounter: Payer: Self-pay | Admitting: Internal Medicine

## 2021-10-24 VITALS — BP 131/98 | HR 73 | Temp 97.2°F | Ht 67.0 in | Wt 193.1 lb

## 2021-10-24 DIAGNOSIS — Z636 Dependent relative needing care at home: Secondary | ICD-10-CM

## 2021-10-24 DIAGNOSIS — E039 Hypothyroidism, unspecified: Secondary | ICD-10-CM

## 2021-10-24 DIAGNOSIS — N401 Enlarged prostate with lower urinary tract symptoms: Secondary | ICD-10-CM | POA: Diagnosis not present

## 2021-10-24 DIAGNOSIS — N1831 Chronic kidney disease, stage 3a: Secondary | ICD-10-CM

## 2021-10-24 DIAGNOSIS — G8929 Other chronic pain: Secondary | ICD-10-CM | POA: Diagnosis not present

## 2021-10-24 DIAGNOSIS — M25562 Pain in left knee: Secondary | ICD-10-CM

## 2021-10-24 DIAGNOSIS — H539 Unspecified visual disturbance: Secondary | ICD-10-CM | POA: Diagnosis not present

## 2021-10-24 DIAGNOSIS — E782 Mixed hyperlipidemia: Secondary | ICD-10-CM

## 2021-10-24 DIAGNOSIS — M25511 Pain in right shoulder: Secondary | ICD-10-CM

## 2021-10-24 DIAGNOSIS — I10 Essential (primary) hypertension: Secondary | ICD-10-CM

## 2021-10-24 DIAGNOSIS — I480 Paroxysmal atrial fibrillation: Secondary | ICD-10-CM | POA: Diagnosis not present

## 2021-10-24 DIAGNOSIS — R350 Frequency of micturition: Secondary | ICD-10-CM

## 2021-10-24 NOTE — Patient Instructions (Addendum)
Voltaren Gel to apply on Middle side of Left knee every day to see if it helps with the pain. If not then let me know Make appointment with your Eye Doctor for Exam

## 2021-10-24 NOTE — Progress Notes (Signed)
Location:  [place] of Service:  Clinic (12)  Provider:   Code Status: DNR Goals of Care:  Advanced Directives 05/19/2019  Does Patient Have a Medical Advance Directive? No  Type of Advance Directive -  Does patient want to make changes to medical advance directive? -  Copy of [place] in Chart? -  Would patient like information on creating a medical advance directive? No - Patient declined     Chief Complaint  Patient presents with   Medical Management of Chronic Issues    Patient returns to the clinic for 6 month follow up.     HPI: Patient is a 87 y.o. male seen today for medical management of chronic diseases.    Patient has h/o Hypertension , HLD, Atrial Fibrillation, And Right Shoulder Arthritis  Active issues Hypertension Most of the readings at home have been good  Vision issues This seems new problem. Says he has been having issue with Blurriness Caregiver stress Wants to talk about his wife who is in SNF for Dementia and Depression Left Knee pain C/o Pain in his knee when tries to walk down the steps No Injury or swelling Has had pain before No Falls or weakness in the leg  [place] in [place] and [place]ADLS   Past Medical History:  Diagnosis Date   Atrial fibrillation ([place]) 03/26/2011   Basal cell carcinoma of other specified sites of skin 4/10/012   Basal cell carcinoma of skin, site unspecified 03/25/2012   Benign neoplasm of colon 03/25/2012   adenomatous polyps   Broken arm 11/25/2017   Colon polyp 2014   Degeneration of lumbar or lumbosacral intervertebral disc 09/24/2011   Disturbance of skin sensation 01/28/2012   Diverticulosis 2014   Dysphagia, unspecified(787.20) 03/26/2011   Elevated prostate specific antigen (PSA) 03/26/2011   Exposure to unspecified radiation 03/26/2011   Family history of colon cancer 07/16/2013   GERD (gastroesophageal reflux disease) 07/16/2013   Hearing deficit  12/21/2013   Hemangioma of unspecified site 03/26/2011   Hx of adenomatous colonic polyps 07/16/2013   Hypertrophy of prostate with urinary obstruction and other lower urinary tract symptoms (LUTS) 03/26/2011   Ingrowing nail 03/26/2011   Internal hemorrhoids without mention of complication 06/17/21   Keratosis, actinic 01/28/2012   Long term (current) use of anticoagulants 03/26/2011   Long term current use of anticoagulant therapy 07/23/2016   Lumbago 03/26/2011   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   Osteoarthrosis, unspecified whether generalized or localized, unspecified site 11/24/2012   Other and unspecified hyperlipidemia 03/26/2011   Other malaise and fatigue 03/26/2011   Pain in joint, forearm 11/24/2012   Pain in joint, lower leg 06/15/13   left knee   Paresthesia of left arm 07/05/2014   Rectal bleed 06/15/13   Reflux esophagitis 03/26/2011   Sacroiliitis, not elsewhere classified ([place]) 05/28/2011   Scoliosis (and kyphoscoliosis), idiopathic 05/28/2011   Squamous cell carcinoma of skin of upper limb, including shoulder 03/26/2011   Tinnitus 12/21/2013   Transient ischemic attack (TIA), 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 Jarome Matin   COLONOSCOPY  08/12/2013   Dr. Hilarie Fredrickson adenomatous polyps   CRYOTHERAPY Left 2009   hand   PHOTODYNAMIC THERAPY  09/26/14   face and ear lobs  Dr. Jarome Matin   SQUAMOUS CELL CARCINOMA EXCISION Left 2010   hand    Allergies  Allergen Reactions   Ciprofloxacin Other (See Comments)    Patient doesn't like the way it makes him feel   Zestril [Lisinopril]     unknown   Sulfa Antibiotics Rash    Outpatient Encounter Medications as of 10/24/2021  Medication Sig   acetaminophen (TYLENOL) 500 MG tablet Take 500 mg by mouth daily as needed. Take it twice a  day 3 days a week   amLODipine (NORVASC) 5 MG tablet Take 1 tablet (5 mg total) by mouth daily.   ELIQUIS 5 MG TABS tablet TAKE 1 TABLET TWICE A DAY FOR ANTICOAGULATION   famotidine (PEPCID) 20 MG tablet Take 20 mg by mouth as needed for heartburn or indigestion.   finasteride (PROSCAR) 5 MG tablet TAKE 1 TABLET DAILY FOR URINATING DRIBBLING/PROSTATE ENLARGEMENT   levothyroxine (SYNTHROID) 25 MCG tablet Take 1 tablet (25 mcg total) by mouth daily before breakfast.   metoprolol succinate (TOPROL-XL) 50 MG 24 hr tablet TAKE 1 TABLET TWICE A DAY   WITH OR IMMEDIATELY FOLLOWING A MEAL TO CONTROL BLOOD PRESSURE AND HEART RHYTHM   simvastatin (ZOCOR) 10 MG tablet TAKE 1 TABLET DAILY TO LOWER CHOLESTEROL   terazosin (HYTRIN) 2 MG capsule TAKE 1 CAPSULE DAILY   valsartan (DIOVAN) 160 MG tablet TAKE 1 TABLET DAILY   No facility-administered encounter medications on file as of 10/24/2021.    Review of Systems:  Review of Systems  Constitutional:  Positive for activity change.  HENT: Negative.    Eyes:  Positive for visual disturbance.  Respiratory: Negative.    Cardiovascular: Negative.   Gastrointestinal: Negative.   Genitourinary: Negative.   Musculoskeletal:  Positive for arthralgias and myalgias.  Skin: Negative.   Neurological: Negative.   Psychiatric/Behavioral: Negative.     Health Maintenance  Topic Date Due   COVID-19 Vaccine (4 - Booster for Moderna series) 12/25/2020   TETANUS/TDAP  09/04/2026   Pneumonia Vaccine 1+ Years old  Completed   INFLUENZA VACCINE  Completed   Zoster Vaccines- Shingrix  Completed   HPV VACCINES  Aged Out   COLONOSCOPY (Pts 45-15yrs Insurance coverage will need to be confirmed)  Discontinued    Physical Exam: Vitals:   10/24/21 1333  BP: (!) 131/98  Pulse: 73  Temp: (!) 97.2 F (36.2 C)  SpO2: 99%  Weight: 193 lb 1.6 oz (87.6 kg)  Height: 5\' 7"  (1.702 m)   Body mass index is 30.24 kg/m. Physical Exam Constitutional: Oriented to person,  place, and time. Well-developed and well-nourished.  HENT:  Head: Normocephalic.  Mouth/Throat: Oropharynx is clear and moist.  Eyes: Pupils are equal, round, and reactive to light. No redness or pain Neck: Neck supple.  Cardiovascular: Normal rate and normal heart sounds.  No murmur heard. Pulmonary/Chest: Effort normal and breath sounds normal. No respiratory distress. No wheezes. She has no rales.  Abdominal: Soft. Bowel sounds are normal. No distension. There is no tenderness. There is no rebound.  Musculoskeletal: No edema. Left Knee No Swelling No redness No Limited in movement Does have a Point tenderness in Lateral Ligament Lymphadenopathy: none Neurological: Alert and oriented to person, place, and time.  Skin: Skin is warm and dry.  Psychiatric: Normal mood and affect. Behavior is normal. Thought content normal.   Labs reviewed: Basic Metabolic Panel: Recent Labs    04/19/21 0000 10/17/21 0843  NA 141 141  K 4.0 3.8  CL 107 108  CO2 25 26  GLUCOSE 79 97  BUN 15 14  CREATININE 1.14* 1.18  CALCIUM 9.1 9.0  TSH 3.26  --    Liver Function Tests: Recent Labs    04/19/21 0000 10/17/21 0843  AST 15 16  ALT 7* 10  BILITOT 1.0 0.8  PROT 6.3 6.4   No results for input(s): LIPASE, AMYLASE in the last 8760 hours. No results for input(s): AMMONIA in the last 8760 hours. CBC: Recent Labs    10/17/21 0843  WBC 7.1  NEUTROABS 4,196  HGB 15.0  HCT 43.3  MCV 96.2  PLT 185   Lipid Panel: Recent Labs    04/19/21 0000 10/17/21 0843  CHOL 133 117  HDL 46 43  LDLCALC 69 54  TRIG 96 116  CHOLHDL 2.9 2.7   No results found for: HGBA1C  Procedures since last visit: No results found.  Assessment/Plan Essential hypertension Stable on Losartan , Norvasc and Toprol  Paroxysmal atrial fibrillation (HCC) On Toprol and Eliquis Does not follow with cardiology Says he will see them as PRN Stage 3a chronic kidney disease ([place]) Creat stable Mixed  hyperlipidemia On Statin  Hypothyroidism, unspecified type TSH good limits  Benign prostatic hyperplasia with urinary frequency Does not follow with Urology No Recent change in his symptoms On Proscar and Hytrin Caregiver stress Support provided Chronic right shoulder pain Doing well with Swimming Refused surgery in the past Chronic pain of left knee With Recent Flare Will try Voltaren Get for now Vision Issues Will contact his Opthalmologist  Labs/tests ordered:  * No order type specified * Next appt:  04/18/2022

## 2021-10-29 ENCOUNTER — Other Ambulatory Visit: Payer: Self-pay | Admitting: Internal Medicine

## 2021-11-22 DIAGNOSIS — H353132 Nonexudative age-related macular degeneration, bilateral, intermediate dry stage: Secondary | ICD-10-CM | POA: Diagnosis not present

## 2021-12-18 DIAGNOSIS — H31091 Other chorioretinal scars, right eye: Secondary | ICD-10-CM | POA: Diagnosis not present

## 2021-12-18 DIAGNOSIS — H353134 Nonexudative age-related macular degeneration, bilateral, advanced atrophic with subfoveal involvement: Secondary | ICD-10-CM | POA: Diagnosis not present

## 2021-12-18 DIAGNOSIS — H3561 Retinal hemorrhage, right eye: Secondary | ICD-10-CM | POA: Diagnosis not present

## 2021-12-18 DIAGNOSIS — H43813 Vitreous degeneration, bilateral: Secondary | ICD-10-CM | POA: Diagnosis not present

## 2022-01-07 ENCOUNTER — Other Ambulatory Visit: Payer: Self-pay | Admitting: Internal Medicine

## 2022-02-18 DIAGNOSIS — Z85828 Personal history of other malignant neoplasm of skin: Secondary | ICD-10-CM | POA: Diagnosis not present

## 2022-02-18 DIAGNOSIS — L570 Actinic keratosis: Secondary | ICD-10-CM | POA: Diagnosis not present

## 2022-02-18 DIAGNOSIS — D1801 Hemangioma of skin and subcutaneous tissue: Secondary | ICD-10-CM | POA: Diagnosis not present

## 2022-02-18 DIAGNOSIS — D225 Melanocytic nevi of trunk: Secondary | ICD-10-CM | POA: Diagnosis not present

## 2022-02-18 DIAGNOSIS — L821 Other seborrheic keratosis: Secondary | ICD-10-CM | POA: Diagnosis not present

## 2022-03-12 ENCOUNTER — Other Ambulatory Visit: Payer: Self-pay | Admitting: Internal Medicine

## 2022-03-25 ENCOUNTER — Non-Acute Institutional Stay: Payer: Medicare Other | Admitting: Orthopedic Surgery

## 2022-03-25 ENCOUNTER — Encounter: Payer: Self-pay | Admitting: Orthopedic Surgery

## 2022-03-25 ENCOUNTER — Other Ambulatory Visit: Payer: Self-pay | Admitting: Internal Medicine

## 2022-03-25 VITALS — BP 137/87 | HR 90 | Temp 98.1°F | Ht 67.0 in | Wt 189.2 lb

## 2022-03-25 DIAGNOSIS — Z0000 Encounter for general adult medical examination without abnormal findings: Secondary | ICD-10-CM

## 2022-03-25 NOTE — Progress Notes (Signed)
? ?Subjective:  ? Phillip [name] is a 88 y.o. male who presents for Medicare Annual/Subsequent preventive examination. ? ?Place of Service: [facility] ?Provider: Windell Moulding, AGNP-C  ? ?Review of Systems    ? ?Cardiac Risk Factors include: advanced age (>25mn, >>68women);sedentary lifestyle;hypertension ? ?   ?Objective:  ?  ?Today's Vitals  ? 03/25/22 1514  ?BP: 137/87  ?Pulse: 90  ?Temp: 98.1 ?F (36.7 ?C)  ?TempSrc: Temporal  ?SpO2: 100%  ?Weight: 189 lb 3.2 oz (85.8 kg)  ?Height: '5\' 7"'$  (1.702 m)  ? ?Body mass index is 29.63 kg/m?. ? ? ?  03/25/2022  ? 11:37 AM 05/19/2019  ?  1:36 PM 03/17/2019  ?  2:54 PM 12/15/2018  ? 11:10 AM 03/18/2018  ?  2:46 PM 01/28/2018  ? 12:32 PM 05/27/2017  ?  8:56 AM  ?Advanced Directives  ?Does Patient Have a Medical Advance Directive? No;Yes No No No No No No  ?Type of Advance Directive Living will        ?Would patient like information on creating a medical advance directive?  No - Patient declined Yes (MAU/Ambulatory/Procedural Areas - Information given) No - Patient declined No - Patient declined Yes (MAU/Ambulatory/Procedural Areas - Information given)   ? ? ?Current Medications (verified) ?Outpatient Encounter Medications as of 03/25/2022  ?Medication Sig  ? acetaminophen (TYLENOL) 500 MG tablet Take 500 mg by mouth daily as needed.  ? ELIQUIS 5 MG TABS tablet TAKE 1 TABLET TWICE A DAY FOR ANTICOAGULATION  ? famotidine (PEPCID) 20 MG tablet Take 20 mg by mouth as needed for heartburn or indigestion.  ? finasteride (PROSCAR) 5 MG tablet TAKE 1 TABLET DAILY FOR URINATING DRIBBLING/PROSTATE ENLARGEMENT  ? levothyroxine (SYNTHROID) 25 MCG tablet TAKE 1 TABLET DAILY BEFORE BREAKFAST  ? metoprolol succinate (TOPROL-XL) 50 MG 24 hr tablet TAKE 1 TABLET TWICE A DAY   WITH OR IMMEDIATELY FOLLOWING A MEAL TO CONTROL BLOOD PRESSURE AND HEART RHYTHM  ? simvastatin (ZOCOR) 10 MG tablet TAKE 1 TABLET DAILY TO LOWER CHOLESTEROL  ? terazosin (HYTRIN) 2 MG capsule TAKE 1 CAPSULE DAILY  ? valsartan  (DIOVAN) 160 MG tablet TAKE 1 TABLET DAILY  ? [DISCONTINUED] amLODipine (NORVASC) 5 MG tablet Take 1 tablet (5 mg total) by mouth daily.  ? ?No facility-administered encounter medications on file as of 03/25/2022.  ? ? ?Allergies (verified) ?Ciprofloxacin, Zestril [lisinopril], and Sulfa antibiotics  ? ?History: ?Past Medical History:  ?Diagnosis Date  ? [place] 03/26/2011  ? Basal cell carcinoma of other specified sites of skin 4/10/012  ? Basal cell carcinoma of skin, site unspecified 03/25/2012  ? Benign neoplasm of colon 03/25/2012  ? adenomatous polyps  ? Broken arm 11/25/2017  ? Colon polyp 2014  ? Degeneration of lumbar or lumbosacral intervertebral disc 09/24/2011  ? Disturbance of skin sensation 01/28/2012  ? Diverticulosis 2014  ? Dysphagia, unspecified(787.20) 03/26/2011  ? Elevated prostate specific antigen (PSA) 03/26/2011  ? Exposure to unspecified radiation 03/26/2011  ? Family history of colon cancer 07/16/2013  ? GERD (gastroesophageal reflux disease) 07/16/2013  ? Hearing deficit 12/21/2013  ? Hemangioma of unspecified site 03/26/2011  ? Hx of adenomatous colonic polyps 07/16/2013  ? Hypertrophy of prostate with urinary obstruction and other lower urinary tract symptoms (LUTS) 03/26/2011  ? Ingrowing nail 03/26/2011  ? Internal hemorrhoids without mention of complication 74/4/31 ? Keratosis, actinic 01/28/2012  ? Long term (current) use of anticoagulants 03/26/2011  ? Long term current use of anticoagulant therapy 07/23/2016  ?  Lumbago 03/26/2011  ? 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 Osteoarthrosis, unspecified whether generalized or localized, unspecified site 11/24/2012  ? Other and unspecified hyperlipidemia 03/26/2011  ? Other malaise and fatigue 03/26/2011  ? Pain in joint, forearm 11/24/2012  ? Pain in joint, lower leg 06/15/13  ? left knee  ? Paresthesia of left arm 07/05/2014  ? Rectal bleed 06/15/13  ? Reflux esophagitis 03/26/2011  ? Sacroiliitis, not elsewhere classified  ([place]) 05/28/2011  ? Scoliosis (and kyphoscoliosis), idiopathic 05/28/2011  ? Squamous cell carcinoma of skin of upper limb, including shoulder 03/26/2011  ? Tinnitus 12/21/2013  ? Transient ischemic attack (TIA), and cerebral infarction without residual deficits(V12.54) 03/26/2011  ? Unspecified essential hypertension 03/26/2011  ? ?Past Surgical History:  ?Procedure Laterality Date  ? BASAL CELL CARCINOMA EXCISION Left 12/10/14  ? Dr. Jarome Matin  ? COLONOSCOPY  08/12/2013  ? Dr. Hilarie Fredrickson adenomatous polyps  ? CRYOTHERAPY Left 2009  ? hand  ? PHOTODYNAMIC THERAPY  09/26/14  ? face and ear lobs  Dr. Jarome Matin  ? SQUAMOUS CELL CARCINOMA EXCISION Left 2010  ? hand  ? ?Family History  ?Problem Relation Age of Onset  ? Cancer Mother   ?     colon  ? Heart disease Father   ?     MI  ? ?Social History  ? ?Socioeconomic History  ? Marital status: Married  ?  Spouse name: Not on file  ? Number of children: Not on file  ? Years of education: Not on file  ? Highest education level: Not on file  ?Occupational History  ? 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  Employer: RETIRED  ?Tobacco Use  ? Smoking status: Never  ? Smokeless tobacco: Never  ?Vaping Use  ? Vaping Use: Never used  ?Substance and Sexual Activity  ? Alcohol use: No  ? Drug use: No  ? Sexual activity: Not on file  ?Other Topics Concern  ? Not on file  ?Social History Narrative  ? Lives at [facility] since 11/[facility]  ? Married - Energy manager  ? Does not have Living Will  ? Never smoked  ? Alcohol none  ? Exercise: tennis, pool, table tennis 2-3 times a week  ? ?Social Determinants of Health  ? ?Financial Resource Strain: Low Risk   ? Difficulty of Paying Living Expenses: Not hard at all  ?Food Insecurity: No Food Insecurity  ? Worried About Charity fundraiser in the Last Year: Never true  ? Ran Out of Food in the Last Year: Never true  ?Transportation Needs: No Transportation Needs  ? Lack of Transportation (Medical): No  ? Lack of Transportation  (Non-Medical): No  ?Physical Activity: Inactive  ? Days of Exercise per Week: 0 days  ? Minutes of Exercise per Session: 0 min  ?Stress: No Stress Concern Present  ? Feeling of Stress : Only a little  ?Social Connections: Moderately Isolated  ? Frequency of Communication with Friends and Family: More than three times a week  ? Frequency of Social Gatherings with Friends and Family: More than three times a week  ? Attends Religious Services: Never  ? Active Member of Clubs or Organizations: No  ? Attends Archivist Meetings: Never  ? Marital Status: Married  ? ? ?Tobacco Counseling ?Counseling given: Not Answered ? ? ?Clinical Intake: ? ?Pre-visit preparation completed: Yes ? ?Pain : No/denies pain ? ?  ? ?BMI - recorded: 29.63 ?Nutritional Status:  BMI 25 -29 Overweight ?Nutritional Risks: None ?Diabetes: No ? ?How often do you need to have someone help you when you read instructions, pamphlets, or other written materials from your doctor or pharmacy?: 2 - Rarely ?What is the last grade level you completed in school?: 4 years college ? ?Diabetic?no ? ?Interpreter Needed?: No ? ?  ? ? ?Activities of Daily Living ? ?  03/25/2022  ?  3:45 PM  ?In your present state of health, do you have any difficulty performing the following activities:  ?Vision? 0  ?Difficulty concentrating or making decisions? 0  ?Walking or climbing stairs? 0  ?Dressing or bathing? 0  ?Doing errands, shopping? 0  ?Preparing Food and eating ? N  ?Using the Toilet? N  ?In the past six months, have you accidently leaked urine? N  ?Do you have problems with loss of bowel control? N  ?Managing your Medications? N  ?Managing your Finances? N  ?Housekeeping or managing your Housekeeping? N  ? ? ?Patient Care Team: ?Virgie Dad, MD as PCP - General (Internal Medicine) ?Jarome Matin, MD as Consulting Physician (Dermatology) ?[place] ?Wardell Honour, MD as Attending Physician (Family Medicine) ? ?Indicate any recent Medical Services  you may have received from other than [facility] providers in the past year (date may be approximate). ? ?   ?Assessment:  ? This is a routine wellness examination for Malone. ? ?Hearing/Vision screen ?Hearing Scre

## 2022-03-25 NOTE — Patient Instructions (Addendum)
?  Phillip [name] , ?Thank you for taking time to come for your Medicare Wellness Visit. I appreciate your ongoing commitment to your health goals. Please review the following plan we discussed and let me know if I can assist you in the future.  ? ?These are the goals we discussed: ? Goals   ? ?  Maintain Mobility and Function   ?  Evidence-based guidance:  ?Emphasize the importance of physical activity and aerobic exercise as included in treatment plan; assess barriers to adherence; consider patient's abilities and preferences.  ?Encourage gradual increase in activity or exercise instead of stopping if pain occurs.  ?Reinforce individual therapy exercise prescription, such as strengthening, stabilization and stretching programs.  ?Promote optimal body mechanics to stabilize the spine with lifting and functional activity.  ?Encourage activity and mobility modifications to facilitate optimal function, such as using a log roll for bed mobility or dressing from a seated position.  ?Reinforce individual adaptive equipment recommendations to limit excessive spinal movements, such as a Systems analyst.  ?Assess adequacy of sleep; encourage use of sleep hygiene techniques, such as bedtime routine; use of white noise; dark, cool bedroom; avoiding daytime naps, heavy meals or exercise before bedtime.  ?Promote positions and modification to optimize sleep and sexual activity; consider pillows or positioning devices to assist in maintaining neutral spine.  ?Explore options for applying ergonomic principles at work and home, such as frequent position changes, using ergonomically designed equipment and working at optimal height.  ?Promote modifications to increase comfort with driving such as lumbar support, optimizing seat and steering wheel position, using cruise control and taking frequent rest stops to stretch and walk.   ?Notes:  ?  ? ?  ?  ?This is a list of the screening recommended for you and due dates:  ?Health  Maintenance  ?Topic Date Due  ? COVID-19 Vaccine (3 - Mixed Product risk series) 09/05/2021  ? Flu Shot  07/16/2022  ? Tetanus Vaccine  09/04/2026  ? Pneumonia Vaccine  Completed  ? Zoster (Shingles) Vaccine  Completed  ? HPV Vaccine  Aged Out  ? Colon Cancer Screening  Discontinued  ? ?Recommend getting Prevnar 20 vaccine (pneumonia vaccine) at local pharmacy.  ?

## 2022-04-18 DIAGNOSIS — E782 Mixed hyperlipidemia: Secondary | ICD-10-CM

## 2022-04-18 DIAGNOSIS — I480 Paroxysmal atrial fibrillation: Secondary | ICD-10-CM | POA: Diagnosis not present

## 2022-04-18 DIAGNOSIS — I10 Essential (primary) hypertension: Secondary | ICD-10-CM

## 2022-04-18 DIAGNOSIS — E039 Hypothyroidism, unspecified: Secondary | ICD-10-CM | POA: Diagnosis not present

## 2022-04-19 LAB — CBC WITH DIFFERENTIAL/PLATELET
Absolute Monocytes: 608 cells/uL (ref 200–950)
Basophils Absolute: 39 cells/uL (ref 0–200)
Basophils Relative: 0.5 %
Eosinophils Absolute: 200 cells/uL (ref 15–500)
Eosinophils Relative: 2.6 %
HCT: 44.1 % (ref 38.5–50.0)
Hemoglobin: 15.2 g/dL (ref 13.2–17.1)
Lymphs Abs: 1324 cells/uL (ref 850–3900)
MCH: 32.8 pg (ref 27.0–33.0)
MCHC: 34.5 g/dL (ref 32.0–36.0)
MCV: 95.2 fL (ref 80.0–100.0)
MPV: 9.3 fL (ref 7.5–12.5)
Monocytes Relative: 7.9 %
Neutro Abs: 5529 cells/uL (ref 1500–7800)
Neutrophils Relative %: 71.8 %
Platelets: 187 10*3/uL (ref 140–400)
RBC: 4.63 10*6/uL (ref 4.20–5.80)
RDW: 13.1 % (ref 11.0–15.0)
Total Lymphocyte: 17.2 %
WBC: 7.7 10*3/uL (ref 3.8–10.8)

## 2022-04-19 LAB — COMPLETE METABOLIC PANEL WITH GFR
AG Ratio: 1.4 (calc) (ref 1.0–2.5)
ALT: 12 U/L (ref 9–46)
AST: 18 U/L (ref 10–35)
Albumin: 3.9 g/dL (ref 3.6–5.1)
Alkaline phosphatase (APISO): 88 U/L (ref 35–144)
BUN: 17 mg/dL (ref 7–25)
CO2: 28 mmol/L (ref 20–32)
Calcium: 9.6 mg/dL (ref 8.6–10.3)
Chloride: 105 mmol/L (ref 98–110)
Creat: 1.08 mg/dL (ref 0.70–1.22)
Globulin: 2.8 g/dL (calc) (ref 1.9–3.7)
Glucose, Bld: 115 mg/dL — ABNORMAL HIGH (ref 65–99)
Potassium: 3.8 mmol/L (ref 3.5–5.3)
Sodium: 140 mmol/L (ref 135–146)
Total Bilirubin: 0.5 mg/dL (ref 0.2–1.2)
Total Protein: 6.7 g/dL (ref 6.1–8.1)
eGFR: 65 mL/min/{1.73_m2} (ref >=60)

## 2022-04-19 LAB — LIPID PANEL
Cholesterol: 130 mg/dL (ref <200)
HDL: 44 mg/dL (ref >=40)
LDL Cholesterol (Calc): 65 mg/dL (calc)
Non-HDL Cholesterol (Calc): 86 mg/dL (calc) (ref <130)
Total CHOL/HDL Ratio: 3 (calc) (ref <5.0)
Triglycerides: 120 mg/dL (ref <150)

## 2022-04-19 LAB — TSH: TSH: 4.03 mIU/L (ref 0.40–4.50)

## 2022-04-24 ENCOUNTER — Encounter: Payer: Self-pay | Admitting: Internal Medicine

## 2022-04-24 ENCOUNTER — Non-Acute Institutional Stay: Payer: Medicare Other | Admitting: Internal Medicine

## 2022-04-24 VITALS — BP 135/84 | HR 72 | Temp 98.1°F | Ht 67.0 in | Wt 189.0 lb

## 2022-04-24 DIAGNOSIS — H353132 Nonexudative age-related macular degeneration, bilateral, intermediate dry stage: Secondary | ICD-10-CM | POA: Diagnosis not present

## 2022-04-24 DIAGNOSIS — I10 Essential (primary) hypertension: Secondary | ICD-10-CM

## 2022-04-24 DIAGNOSIS — E782 Mixed hyperlipidemia: Secondary | ICD-10-CM | POA: Diagnosis not present

## 2022-04-24 DIAGNOSIS — R350 Frequency of micturition: Secondary | ICD-10-CM | POA: Diagnosis not present

## 2022-04-24 DIAGNOSIS — G8929 Other chronic pain: Secondary | ICD-10-CM

## 2022-04-24 DIAGNOSIS — M25511 Pain in right shoulder: Secondary | ICD-10-CM | POA: Diagnosis not present

## 2022-04-24 DIAGNOSIS — Z636 Dependent relative needing care at home: Secondary | ICD-10-CM | POA: Diagnosis not present

## 2022-04-24 DIAGNOSIS — N1831 Chronic kidney disease, stage 3a: Secondary | ICD-10-CM

## 2022-04-24 DIAGNOSIS — I480 Paroxysmal atrial fibrillation: Secondary | ICD-10-CM

## 2022-04-24 DIAGNOSIS — N401 Enlarged prostate with lower urinary tract symptoms: Secondary | ICD-10-CM

## 2022-04-24 DIAGNOSIS — E039 Hypothyroidism, unspecified: Secondary | ICD-10-CM | POA: Diagnosis not present

## 2022-04-24 NOTE — Progress Notes (Signed)
? ?Location:  [place] ?  ?Place of Service:  Clinic (12) ? ?Provider:  ? ?Code Status: DNR ?Goals of Care:  ? ?  04/24/2022  ?  1:45 PM  ?Advanced Directives  ?Does Patient Have a Medical Advance Directive? No;Yes  ?Type of Advance Directive Living will  ?Does patient want to make changes to medical advance directive? No - Patient declined  ? ? ? ?Chief Complaint  ?Patient presents with  ? Medical Management of Chronic Issues  ?  Patient returns to the clinic for his 6 month follow up.   ? ? ?HPI: Patient is a 88 y.o. male seen today for medical management of chronic diseases.   ? ?Patient has h/o Hypertension , HLD, Atrial Fibrillation, And Right Shoulder Arthritis ?And now recent diagnosis of macular degeneration dry ? ?Due to his vision issues patient has been not driving too much.  He is still able to go to local grocery store ?Continues to have stress due to his wife who is now in SNF for dementia and depression ?Arthritis in his shoulder is not better since he stopped playing tennis ?Continues to be independent in his ADLs ?Have 2 daughters who are able to help him ? ?Past Medical History:  ?Diagnosis Date  ? Atrial fibrillation ([place]) 03/26/2011  ? Basal cell carcinoma of other specified sites of skin 4/10/012  ? Basal cell carcinoma of skin, site unspecified 03/25/2012  ? Benign neoplasm of colon 03/25/2012  ? adenomatous polyps  ? Broken arm 11/25/2017  ? Colon polyp 2014  ? Degeneration of lumbar or lumbosacral intervertebral disc 09/24/2011  ? Disturbance of skin sensation 01/28/2012  ? Diverticulosis 2014  ? Dysphagia, unspecified(787.20) 03/26/2011  ? Elevated prostate specific antigen (PSA) 03/26/2011  ? Exposure to unspecified radiation 03/26/2011  ? Family history of colon cancer 07/16/2013  ? GERD (gastroesophageal reflux disease) 07/16/2013  ? Hearing deficit 12/21/2013  ? Hemangioma of unspecified site 03/26/2011  ? Hx of adenomatous colonic polyps 07/16/2013  ? Hypertrophy of prostate with urinary  obstruction and other lower urinary tract symptoms (LUTS) 03/26/2011  ? Ingrowing nail 03/26/2011  ? Internal hemorrhoids without mention of complication 08/21/28  ? Keratosis, actinic 01/28/2012  ? Long term (current) use of anticoagulants 03/26/2011  ? Long term current use of anticoagulant therapy 07/23/2016  ? Lumbago 03/26/2011  ? 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 Osteoarthrosis, unspecified whether generalized or localized, unspecified site 11/24/2012  ? Other and unspecified hyperlipidemia 03/26/2011  ? Other malaise and fatigue 03/26/2011  ? Pain in joint, forearm 11/24/2012  ? Pain in joint, lower leg 06/15/13  ? left knee  ? Paresthesia of left arm 07/05/2014  ? Rectal bleed 06/15/13  ? Reflux esophagitis 03/26/2011  ? Sacroiliitis, not elsewhere classified ([place]) 05/28/2011  ? Scoliosis (and kyphoscoliosis), idiopathic 05/28/2011  ? Squamous cell carcinoma of skin of upper limb, including shoulder 03/26/2011  ? Tinnitus 12/21/2013  ? Transient ischemic attack (TIA), and cerebral infarction without residual deficits(V12.54) 03/26/2011  ? Unspecified essential hypertension 03/26/2011  ? ? ?Past Surgical History:  ?Procedure Laterality Date  ? BASAL CELL CARCINOMA EXCISION Left 12/10/14  ? Dr. Jarome Matin  ? COLONOSCOPY  08/12/2013  ? Dr. Hilarie Fredrickson adenomatous polyps  ? CRYOTHERAPY Left 2009  ? hand  ? PHOTODYNAMIC THERAPY  09/26/14  ? face and ear lobs  Dr. Jarome Matin  ? SQUAMOUS CELL CARCINOMA EXCISION Left 2010  ? hand  ? ? ?Allergies  ?Allergen Reactions  ?  Ciprofloxacin Other (See Comments)  ?  Patient doesn't like the way it makes him feel  ? Zestril [Lisinopril]   ?  unknown  ? Sulfa Antibiotics Rash  ? ? ?Outpatient Encounter Medications as of 04/24/2022  ?Medication Sig  ? acetaminophen (TYLENOL) 500 MG tablet Take 500 mg by mouth daily as needed.  ? ELIQUIS 5 MG TABS tablet TAKE 1 TABLET TWICE A DAY FOR ANTICOAGULATION  ? famotidine (PEPCID) 20 MG tablet Take 20 mg by mouth as needed for heartburn  or indigestion.  ? finasteride (PROSCAR) 5 MG tablet TAKE 1 TABLET DAILY FOR URINATING DRIBBLING/PROSTATE ENLARGEMENT  ? levothyroxine (SYNTHROID) 25 MCG tablet TAKE 1 TABLET DAILY BEFORE BREAKFAST  ? metoprolol succinate (TOPROL-XL) 50 MG 24 hr tablet TAKE 1 TABLET TWICE A DAY   WITH OR IMMEDIATELY FOLLOWING A MEAL TO CONTROL BLOOD PRESSURE AND HEART RHYTHM  ? Multiple Vitamins-Minerals (PRESERVISION AREDS) CAPS Take 2 capsules by mouth daily.  ? simvastatin (ZOCOR) 10 MG tablet TAKE 1 TABLET DAILY TO LOWER CHOLESTEROL  ? terazosin (HYTRIN) 2 MG capsule TAKE 1 CAPSULE DAILY  ? valsartan (DIOVAN) 160 MG tablet TAKE 1 TABLET DAILY  ? ?No facility-administered encounter medications on file as of 04/24/2022.  ? ? ?Review of Systems:  ?Review of Systems  ?Constitutional:  Negative for activity change, appetite change and unexpected weight change.  ?HENT: Negative.    ?Eyes:  Positive for visual disturbance.  ?Respiratory:  Negative for cough and shortness of breath.   ?Cardiovascular:  Negative for leg swelling.  ?Gastrointestinal:  Negative for constipation.  ?Genitourinary:  Negative for frequency.  ?Musculoskeletal:  Negative for arthralgias, gait problem and myalgias.  ?Skin: Negative.  Negative for rash.  ?Neurological:  Negative for dizziness and weakness.  ?Psychiatric/Behavioral:  Negative for confusion and sleep disturbance.   ?All other systems reviewed and are negative. ? ?Health Maintenance  ?Topic Date Due  ? COVID-19 Vaccine (3 - Mixed Product risk series) 11/13/2022 (Originally 09/05/2021)  ? INFLUENZA VACCINE  07/16/2022  ? TETANUS/TDAP  09/04/2026  ? Pneumonia Vaccine 2+ Years old  Completed  ? Zoster Vaccines- Shingrix  Completed  ? HPV VACCINES  Aged Out  ? COLONOSCOPY (Pts 45-24yr Insurance coverage will need to be confirmed)  Discontinued  ? ? ?Physical Exam: ?Vitals:  ? 04/24/22 1343  ?BP: (!) 152/115  ?Pulse: 72  ?Temp: 98.1 ?F (36.7 ?C)  ?SpO2: 97%  ?Weight: 189 lb (85.7 kg)  ?Height: '5\' 7"'$   (1.702 m)  ? ?Body mass index is 29.6 kg/m?.Marland Kitchen?Physical Exam ?Vitals reviewed.  ?Constitutional:   ?   Appearance: Normal appearance.  ?HENT:  ?   Head: Normocephalic.  ?   Nose: Nose normal.  ?   Mouth/Throat:  ?   Mouth: Mucous membranes are moist.  ?   Pharynx: Oropharynx is clear.  ?Eyes:  ?   Pupils: Pupils are equal, round, and reactive to light.  ?Cardiovascular:  ?   Rate and Rhythm: Normal rate. Rhythm irregular.  ?   Pulses: Normal pulses.  ?   Heart sounds: No murmur heard. ?Pulmonary:  ?   Effort: Pulmonary effort is normal. No respiratory distress.  ?   Breath sounds: Normal breath sounds. No rales.  ?Abdominal:  ?   General: Abdomen is flat. Bowel sounds are normal.  ?   Palpations: Abdomen is soft.  ?Musculoskeletal:     ?   General: No swelling.  ?   Cervical back: Neck supple.  ?Skin: ?   General:  Skin is warm.  ?Neurological:  ?   General: No focal deficit present.  ?   Mental Status: He is alert and oriented to person, place, and time.  ?Psychiatric:     ?   Mood and Affect: Mood normal.     ?   Thought Content: Thought content normal.  ? ? ?Labs reviewed: ?Basic Metabolic Panel: ?Recent Labs  ?  10/17/21 ?9211 04/18/22 ?9417  ?NA 141 140  ?K 3.8 3.8  ?CL 108 105  ?CO2 26 28  ?GLUCOSE 97 115*  ?BUN 14 17  ?CREATININE 1.18 1.08  ?CALCIUM 9.0 9.6  ?TSH  --  4.03  ? ?Liver Function Tests: ?Recent Labs  ?  10/17/21 ?4081 04/18/22 ?4481  ?AST 16 18  ?ALT 10 12  ?BILITOT 0.8 0.5  ?PROT 6.4 6.7  ? ?No results for input(s): LIPASE, AMYLASE in the last 8760 hours. ?No results for input(s): AMMONIA in the last 8760 hours. ?CBC: ?Recent Labs  ?  10/17/21 ?8563 04/18/22 ?1497  ?WBC 7.1 7.7  ?NEUTROABS 4,196 5,529  ?HGB 15.0 15.2  ?HCT 43.3 44.1  ?MCV 96.2 95.2  ?PLT 185 187  ? ?Lipid Panel: ?Recent Labs  ?  10/17/21 ?0263 04/18/22 ?7858  ?CHOL 117 130  ?HDL 43 44  ?LDLCALC 54 65  ?TRIG 116 120  ?CHOLHDL 2.7 3.0  ? ?No results found for: HGBA1C ? ?Procedures since last visit: ?No results  found. ? ?Assessment/Plan ?1. Essential hypertension ?BP controlled ?Same meds ? ?2. Paroxysmal atrial fibrillation (HCC) ?On Eliquis and Toprol ? ?3. Stage 3a chronic kidney disease ([place]) ?Creat stable ? ?4. Mixed hyperlipidemia ?Con

## 2022-04-26 ENCOUNTER — Encounter: Payer: Self-pay | Admitting: Internal Medicine

## 2022-05-28 ENCOUNTER — Encounter: Payer: Self-pay | Admitting: Internal Medicine

## 2022-05-29 ENCOUNTER — Non-Acute Institutional Stay: Payer: Medicare Other | Admitting: Internal Medicine

## 2022-05-29 VITALS — BP 145/93 | HR 76 | Temp 98.0°F | Ht 67.0 in | Wt 187.8 lb

## 2022-05-29 DIAGNOSIS — E782 Mixed hyperlipidemia: Secondary | ICD-10-CM

## 2022-05-29 DIAGNOSIS — E039 Hypothyroidism, unspecified: Secondary | ICD-10-CM

## 2022-05-29 DIAGNOSIS — N401 Enlarged prostate with lower urinary tract symptoms: Secondary | ICD-10-CM | POA: Diagnosis not present

## 2022-05-29 DIAGNOSIS — I480 Paroxysmal atrial fibrillation: Secondary | ICD-10-CM | POA: Diagnosis not present

## 2022-05-29 DIAGNOSIS — R350 Frequency of micturition: Secondary | ICD-10-CM

## 2022-05-29 DIAGNOSIS — I10 Essential (primary) hypertension: Secondary | ICD-10-CM | POA: Diagnosis not present

## 2022-05-29 DIAGNOSIS — N1831 Chronic kidney disease, stage 3a: Secondary | ICD-10-CM | POA: Diagnosis not present

## 2022-05-29 DIAGNOSIS — Z789 Other specified health status: Secondary | ICD-10-CM | POA: Diagnosis not present

## 2022-05-29 NOTE — Progress Notes (Signed)
Location: [facility] (12)  Provider:   Code Status:  Goals of Care:     05/28/2022    2:48 PM  Advanced Directives  Does Patient Have a Medical Advance Directive? No;Yes  Type of Advance Directive Living will  Does patient want to make changes to medical advance directive? No - Patient declined     Chief Complaint  Patient presents with   Acute Visit    Patient returns to the clinic due to not feeling well.     HPI: Patient is a 88 y.o. male seen today for an acute visit for an episode yesterday    Patient has h/o Hypertension , HLD, Atrial Fibrillation, And Right Shoulder Arthritis And now recent diagnosis of macular degeneration dry   He was visiting his Wife in Memory unit yesterday afternoon after having visited his friends in the morning He felt discomfort Not sure what and some sweating in back of his neck He Denies Chest pain or Nausea or Dizziness or any othe rpain Nurses in the unit checked his Vitals and they were all good He was escorted to his room and told to come and see me today He says he is back to Normal Today  No Chest discomfort no nausea  Eating well No Dizziness or palpitations OR SOB  Past Medical History:  Diagnosis Date   Atrial fibrillation ([place]) 03/26/2011   Basal cell carcinoma of other specified sites of skin 4/10/012   Basal cell carcinoma of skin, site unspecified 03/25/2012   Benign neoplasm of colon 03/25/2012   adenomatous polyps   Broken arm 11/25/2017   Colon polyp 2014   Degeneration of lumbar or lumbosacral intervertebral disc 09/24/2011   Disturbance of skin sensation 01/28/2012   Diverticulosis 2014   Dysphagia, unspecified(787.20) 03/26/2011   Elevated prostate specific antigen (PSA) 03/26/2011   Exposure to unspecified radiation 03/26/2011   Family history of colon cancer 07/16/2013   GERD (gastroesophageal reflux disease) 07/16/2013   Hearing deficit 12/21/2013   Hemangioma of unspecified site  03/26/2011   Hx of adenomatous colonic polyps 07/16/2013   Hypertrophy of prostate with urinary obstruction and other lower urinary tract symptoms (LUTS) 03/26/2011   Ingrowing nail 03/26/2011   Internal hemorrhoids without mention of complication 02/16/28   Keratosis, actinic 01/28/2012   Long term (current) use of anticoagulants 03/26/2011   Long term current use of anticoagulant therapy 07/23/2016   Lumbago 03/26/2011   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   Osteoarthrosis, unspecified whether generalized or localized, unspecified site 11/24/2012   Other and unspecified hyperlipidemia 03/26/2011   Other malaise and fatigue 03/26/2011   Pain in joint, forearm 11/24/2012   Pain in joint, lower leg 06/15/13   left knee   Paresthesia of left arm 07/05/2014   Rectal bleed 06/15/13   Reflux esophagitis 03/26/2011   Sacroiliitis, not elsewhere classified ([place]) 05/28/2011   Scoliosis (and kyphoscoliosis), idiopathic 05/28/2011   Squamous cell carcinoma of skin of upper limb, including shoulder 03/26/2011   Tinnitus 12/21/2013   Transient ischemic attack (TIA), 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 Jarome Matin   COLONOSCOPY  08/12/2013   Dr. Hilarie Fredrickson adenomatous polyps   CRYOTHERAPY Left 2009   hand   PHOTODYNAMIC THERAPY  09/26/14   face and ear  lobs  Dr. Jarome Matin   SQUAMOUS CELL CARCINOMA EXCISION Left 2010   hand    Allergies  Allergen Reactions   Ciprofloxacin Other (See Comments)    Patient doesn't like the way it makes him feel   Zestril [Lisinopril]     unknown   Sulfa Antibiotics Rash    Outpatient Encounter Medications as of 05/29/2022  Medication Sig   acetaminophen (TYLENOL) 500 MG tablet Take 500 mg by mouth daily as needed.   ELIQUIS 5 MG TABS tablet TAKE 1 TABLET TWICE A DAY FOR  ANTICOAGULATION   famotidine (PEPCID) 20 MG tablet Take 20 mg by mouth as needed for heartburn or indigestion.   finasteride (PROSCAR) 5 MG tablet TAKE 1 TABLET DAILY FOR URINATING DRIBBLING/PROSTATE ENLARGEMENT   levothyroxine (SYNTHROID) 25 MCG tablet TAKE 1 TABLET DAILY BEFORE BREAKFAST   metoprolol succinate (TOPROL-XL) 50 MG 24 hr tablet TAKE 1 TABLET TWICE A DAY   WITH OR IMMEDIATELY FOLLOWING A MEAL TO CONTROL BLOOD PRESSURE AND HEART RHYTHM   Multiple Vitamins-Minerals (PRESERVISION AREDS) CAPS Take 2 capsules by mouth daily.   simvastatin (ZOCOR) 10 MG tablet TAKE 1 TABLET DAILY TO LOWER CHOLESTEROL   terazosin (HYTRIN) 2 MG capsule TAKE 1 CAPSULE DAILY   valsartan (DIOVAN) 160 MG tablet TAKE 1 TABLET DAILY   No facility-administered encounter medications on file as of 05/29/2022.    Review of Systems:  Review of Systems  Constitutional:  Negative for activity change, appetite change and unexpected weight change.  HENT: Negative.    Respiratory:  Negative for cough and shortness of breath.   Cardiovascular:  Negative for leg swelling.  Gastrointestinal:  Negative for constipation.  Genitourinary:  Negative for frequency.  Musculoskeletal:  Negative for arthralgias, gait problem and myalgias.  Skin: Negative.  Negative for rash.  Neurological:  Positive for weakness. Negative for dizziness.  Psychiatric/Behavioral:  Negative for confusion and sleep disturbance.   All other systems reviewed and are negative.   Health Maintenance  Topic Date Due   COVID-19 Vaccine (4 - Booster for Moderna series) 11/13/2022 (Originally 10/31/2021)   INFLUENZA VACCINE  07/16/2022   TETANUS/TDAP  09/04/2026   Pneumonia Vaccine 15+ Years old  Completed   Zoster Vaccines- Shingrix  Completed   HPV VACCINES  Aged Out   COLONOSCOPY (Pts 45-59yr Insurance coverage will need to be confirmed)  Discontinued    Physical Exam: There were no vitals filed for this visit. There is no height or weight  on file to calculate BMI. Physical Exam Vitals reviewed.  Constitutional:      Appearance: Normal appearance.  HENT:     Head: Normocephalic.     Nose: Nose normal.     Mouth/Throat:     Mouth: Mucous membranes are moist.     Pharynx: Oropharynx is clear.  Eyes:     Pupils: Pupils are equal, round, and reactive to light.  Cardiovascular:     Rate and Rhythm: Normal rate. Rhythm irregular.     Pulses: Normal pulses.     Heart sounds: No murmur heard. Pulmonary:     Effort: Pulmonary effort is normal. No respiratory distress.     Breath sounds: Normal breath sounds. No rales.  Abdominal:     General: Abdomen is flat. Bowel sounds are normal.     Palpations: Abdomen is soft.  Musculoskeletal:        General: No swelling.     Cervical back: Neck supple.  Skin:    General: Skin is  warm.  Neurological:     General: No focal deficit present.     Mental Status: He is alert and oriented to person, place, and time.  Psychiatric:        Mood and Affect: Mood normal.        Thought Content: Thought content normal.    Labs reviewed: Basic Metabolic Panel: Recent Labs    10/17/21 0843 04/18/22 0850  NA 141 140  K 3.8 3.8  CL 108 105  CO2 26 28  GLUCOSE 97 115*  BUN 14 17  CREATININE 1.18 1.08  CALCIUM 9.0 9.6  TSH  --  4.03   Liver Function Tests: Recent Labs    10/17/21 0843 04/18/22 0850  AST 16 18  ALT 10 12  BILITOT 0.8 0.5  PROT 6.4 6.7   No results for input(s): "LIPASE", "AMYLASE" in the last 8760 hours. No results for input(s): "AMMONIA" in the last 8760 hours. CBC: Recent Labs    10/17/21 0843 04/18/22 0850  WBC 7.1 7.7  NEUTROABS 4,196 5,529  HGB 15.0 15.2  HCT 43.3 44.1  MCV 96.2 95.2  PLT 185 187   Lipid Panel: Recent Labs    10/17/21 0843 04/18/22 0850  CHOL 117 130  HDL 43 44  LDLCALC 54 65  TRIG 116 120  CHOLHDL 2.7 3.0   No results found for: "HGBA1C"  Procedures since last visit: No results found.  Assessment/Plan   1.  Discomfort Not sure Etiology If happens again Go to ED  He will also let me know how he feels in next few days  2. Paroxysmal atrial fibrillation (HCC) On Eliquis and Toprol - EKG 12-Lead EKG done Showed A Fib Rate Controlled  3. Essential hypertension Diovan and Toprol  4. Stage 3a chronic kidney disease ([place]) stable  5. Mixed hyperlipidemia On Statin  6. Hypothyroidism, unspecified type TSH normal in 5/23  7. Benign prostatic hyperplasia with urinary frequency On Hytrin  Labs/tests ordered:  * No order type specified * Next appt:  10/17/2022 Total time spent in this patient care encounter was  45_  minutes; greater than 50% of the visit spent counseling patient and staff, reviewing records , Labs and coordinating care for problems addressed at this encounter.

## 2022-06-05 ENCOUNTER — Ambulatory Visit (HOSPITAL_BASED_OUTPATIENT_CLINIC_OR_DEPARTMENT_OTHER)
Admission: RE | Admit: 2022-06-05 | Discharge: 2022-06-05 | Disposition: A | Discharge status: Home / Self Care | Payer: Medicare Other | Source: Ambulatory Visit | Attending: Family Medicine | Admitting: Family Medicine

## 2022-06-05 ENCOUNTER — Ambulatory Visit: Payer: Medicare Other | Admitting: Family Medicine

## 2022-06-05 ENCOUNTER — Encounter: Payer: Self-pay | Admitting: Family Medicine

## 2022-06-05 VITALS — BP 142/98 | HR 97 | Temp 97.4°F | Ht 67.0 in | Wt 184.4 lb

## 2022-06-05 DIAGNOSIS — M25512 Pain in left shoulder: Secondary | ICD-10-CM | POA: Insufficient documentation

## 2022-06-05 DIAGNOSIS — S20222A Contusion of left back wall of thorax, initial encounter: Secondary | ICD-10-CM | POA: Diagnosis not present

## 2022-06-05 DIAGNOSIS — M19012 Primary osteoarthritis, left shoulder: Secondary | ICD-10-CM | POA: Diagnosis not present

## 2022-06-05 DIAGNOSIS — M40204 Unspecified kyphosis, thoracic region: Secondary | ICD-10-CM | POA: Diagnosis not present

## 2022-06-05 NOTE — Progress Notes (Signed)
Provider:  Alain Honey, MD  Careteam: Patient Care Team: Virgie Dad, MD as PCP - General (Internal Medicine) Jarome Matin, MD as Consulting Physician (Dermatology) [place], [facility] Wardell Honour, MD as Attending Physician (Family Medicine)  PLACE OF SERVICE:  [place]  Advanced Directive information    Allergies  Allergen Reactions   Ciprofloxacin Other (See Comments)    Patient doesn't like the way it makes him feel   Zestril [Lisinopril]     unknown   Sulfa Antibiotics Rash    Chief Complaint  Patient presents with   Acute Visit    Patient reports neck and left shoulder pain for a few days. He reports having a fall.     HPI: Patient is a 88 y.o. male .  Patient is accompanied by daughter today for visit.  We had spoken on the telephone earlier today.  Since there was trauma involved with his complaints I ordered x-ray to be followed up with that personal visit at the [facility] home clinic.  He lives alone in an apartment.  His wife is in memory care.  He was attempting to sit on the toilet about 2 weeks ago and fell backwards.  He struck his back but now complains of pain in the left shoulder.  X-rays were done prior to the visit today and showed no fractures but only degenerative changes in both the thoracic spine as well as shoulder  Review of Systems:  Review of Systems  Constitutional: Negative.   Respiratory: Negative.    Cardiovascular: Negative.   Musculoskeletal:  Positive for back pain and joint pain.  Psychiatric/Behavioral: Negative.    All other systems reviewed and are negative.   Past Medical History:  Diagnosis Date   Atrial fibrillation ([place]) 03/26/2011   Basal cell carcinoma of other specified sites of skin 4/10/012   Basal cell carcinoma of skin, site unspecified 03/25/2012   Benign neoplasm of colon 03/25/2012   adenomatous polyps   Broken arm 11/25/2017   Colon polyp 2014   Degeneration of lumbar or lumbosacral  intervertebral disc 09/24/2011   Disturbance of skin sensation 01/28/2012   Diverticulosis 2014   Dysphagia, unspecified(787.20) 03/26/2011   Elevated prostate specific antigen (PSA) 03/26/2011   Exposure to unspecified radiation 03/26/2011   Family history of colon cancer 07/16/2013   GERD (gastroesophageal reflux disease) 07/16/2013   Hearing deficit 12/21/2013   Hemangioma of unspecified site 03/26/2011   Hx of adenomatous colonic polyps 07/16/2013   Hypertrophy of prostate with urinary obstruction and other lower urinary tract symptoms (LUTS) 03/26/2011   Ingrowing nail 03/26/2011   Internal hemorrhoids without mention of complication 05/23/19   Keratosis, actinic 01/28/2012   Long term (current) use of anticoagulants 03/26/2011   Long term current use of anticoagulant therapy 07/23/2016   Lumbago 03/26/2011   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   Osteoarthrosis, unspecified whether generalized or localized, unspecified site 11/24/2012   Other and unspecified hyperlipidemia 03/26/2011   Other malaise and fatigue 03/26/2011   Pain in joint, forearm 11/24/2012   Pain in joint, lower leg 06/15/13   left knee   Paresthesia of left arm 07/05/2014   Rectal bleed 06/15/13   Reflux esophagitis 03/26/2011   Sacroiliitis, not elsewhere classified ([place]) 05/28/2011   Scoliosis (and kyphoscoliosis), idiopathic 05/28/2011   Squamous cell carcinoma of skin of upper limb, including shoulder 03/26/2011   Tinnitus 12/21/2013   Transient ischemic attack (TIA), and cerebral infarction without residual  deficits(V12.54) 03/26/2011   Unspecified essential hypertension 03/26/2011   Past Surgical History:  Procedure Laterality Date   BASAL CELL CARCINOMA EXCISION Left 12/10/14   Dr. Jarome Matin   COLONOSCOPY  08/12/2013   Dr. Hilarie Fredrickson adenomatous polyps   CRYOTHERAPY Left 2009   hand   PHOTODYNAMIC THERAPY  09/26/14   face and ear lobs  Dr. Jarome Matin   SQUAMOUS CELL CARCINOMA EXCISION Left 2010   hand    Social History:   reports that he has never smoked. He has never used smokeless tobacco. He reports that he does not drink alcohol and does not use drugs.  Family History  Problem Relation Age of Onset   Cancer Mother        colon   Heart disease Father        MI    Medications: Patient's Medications  New Prescriptions   No medications on file  Previous Medications   ACETAMINOPHEN (TYLENOL) 500 MG TABLET    Take 500 mg by mouth daily as needed.   ELIQUIS 5 MG TABS TABLET    TAKE 1 TABLET TWICE A DAY FOR ANTICOAGULATION   FAMOTIDINE (PEPCID) 20 MG TABLET    Take 20 mg by mouth as needed for heartburn or indigestion.   FINASTERIDE (PROSCAR) 5 MG TABLET    TAKE 1 TABLET DAILY FOR URINATING DRIBBLING/PROSTATE ENLARGEMENT   LEVOTHYROXINE (SYNTHROID) 25 MCG TABLET    TAKE 1 TABLET DAILY BEFORE BREAKFAST   METOPROLOL SUCCINATE (TOPROL-XL) 50 MG 24 HR TABLET    TAKE 1 TABLET TWICE A DAY   WITH OR IMMEDIATELY FOLLOWING A MEAL TO CONTROL BLOOD PRESSURE AND HEART RHYTHM   MULTIPLE VITAMINS-MINERALS (PRESERVISION AREDS) CAPS    Take 2 capsules by mouth daily.   SIMVASTATIN (ZOCOR) 10 MG TABLET    TAKE 1 TABLET DAILY TO LOWER CHOLESTEROL   TERAZOSIN (HYTRIN) 2 MG CAPSULE    TAKE 1 CAPSULE DAILY   VALSARTAN (DIOVAN) 160 MG TABLET    TAKE 1 TABLET DAILY  Modified Medications   No medications on file  Discontinued Medications   No medications on file    Physical Exam:  There were no vitals filed for this visit. There is no height or weight on file to calculate BMI. Wt Readings from Last 3 Encounters:  05/29/22 187 lb 12.8 oz (85.2 kg)  04/24/22 189 lb (85.7 kg)  03/25/22 189 lb 3.2 oz (85.8 kg)    Physical Exam Vitals and nursing note reviewed.  Constitutional:      Appearance: Normal appearance.  Cardiovascular:     Rate and Rhythm: Normal rate.  Pulmonary:     Effort: Pulmonary effort is normal.  Musculoskeletal:     Comments: Left shoulder with normal contour There is  fairly good range of motion with abduction and abduction as well as internal rotation Exam of his upper back shows no bruising.  There is some tenderness around the left scapula as well as in the upper trapezius muscle area  Neurological:     Mental Status: He is alert.     Labs reviewed: Basic Metabolic Panel: Recent Labs    10/17/21 0843 04/18/22 0850  NA 141 140  K 3.8 3.8  CL 108 105  CO2 26 28  GLUCOSE 97 115*  BUN 14 17  CREATININE 1.18 1.08  CALCIUM 9.0 9.6  TSH  --  4.03   Liver Function Tests: Recent Labs    10/17/21 0843 04/18/22 0850  AST 16 18  ALT 10 12  BILITOT 0.8 0.5  PROT 6.4 6.7   No results for input(s): "LIPASE", "AMYLASE" in the last 8760 hours. No results for input(s): "AMMONIA" in the last 8760 hours. CBC: Recent Labs    10/17/21 0843 04/18/22 0850  WBC 7.1 7.7  NEUTROABS 4,196 5,529  HGB 15.0 15.2  HCT 43.3 44.1  MCV 96.2 95.2  PLT 185 187   Lipid Panel: Recent Labs    10/17/21 0843 04/18/22 0850  CHOL 117 130  HDL 43 44  LDLCALC 54 65  TRIG 116 120  CHOLHDL 2.7 3.0   TSH: Recent Labs    04/18/22 0850  TSH 4.03   A1C: No results found for: "HGBA1C"   Assessment/Plan  1. Acute pain of left shoulder There is no fracture on x-ray I believe this is a contusion and/or sprain.  Plan is to use Tylenol 2-3 times a day.  This has been effective in the last day or 2 since he had been using it at the direction of his daughter who is visiting from [place]  2. Contusion, back, left, initial encounter Discussion is same as above.  Activities as tolerated   Alain Honey, MD [place] [phone]

## 2022-06-06 DIAGNOSIS — S20222A Contusion of left back wall of thorax, initial encounter: Secondary | ICD-10-CM | POA: Insufficient documentation

## 2022-06-10 ENCOUNTER — Other Ambulatory Visit: Payer: Self-pay | Admitting: Nurse Practitioner

## 2022-06-14 DIAGNOSIS — N390 Urinary tract infection, site not specified: Secondary | ICD-10-CM | POA: Diagnosis not present

## 2022-06-14 DIAGNOSIS — R5383 Other fatigue: Secondary | ICD-10-CM | POA: Diagnosis not present

## 2022-06-18 ENCOUNTER — Other Ambulatory Visit: Payer: Self-pay | Admitting: Internal Medicine

## 2022-06-19 ENCOUNTER — Non-Acute Institutional Stay: Payer: Medicare Other | Admitting: Family Medicine

## 2022-06-19 ENCOUNTER — Encounter: Payer: Self-pay | Admitting: Family Medicine

## 2022-06-19 VITALS — BP 118/72 | HR 86 | Temp 98.0°F | Ht 67.0 in | Wt 186.4 lb

## 2022-06-19 DIAGNOSIS — E782 Mixed hyperlipidemia: Secondary | ICD-10-CM | POA: Diagnosis not present

## 2022-06-19 DIAGNOSIS — R419 Unspecified symptoms and signs involving cognitive functions and awareness: Secondary | ICD-10-CM | POA: Insufficient documentation

## 2022-06-19 DIAGNOSIS — G3184 Mild cognitive impairment, so stated: Secondary | ICD-10-CM

## 2022-06-19 DIAGNOSIS — I10 Essential (primary) hypertension: Secondary | ICD-10-CM | POA: Diagnosis not present

## 2022-06-19 DIAGNOSIS — F0390 Unspecified dementia without behavioral disturbance: Secondary | ICD-10-CM | POA: Insufficient documentation

## 2022-06-19 DIAGNOSIS — I4811 Longstanding persistent atrial fibrillation: Secondary | ICD-10-CM

## 2022-06-19 MED ORDER — ESCITALOPRAM OXALATE 10 MG PO TABS: 10.0000 mg | ORAL_TABLET | Freq: Every day | ORAL | 2 refills | Status: DC

## 2022-06-19 NOTE — Progress Notes (Signed)
Provider:  Alain Honey, MD  Careteam: Patient Care Team: Virgie Dad, MD as PCP - General (Internal Medicine) Jarome Matin, MD as Consulting Physician (Dermatology) [place], [facility] Wardell Honour, MD as Attending Physician (Family Medicine)  PLACE OF SERVICE:  [place]  Advanced Directive information    Allergies  Allergen Reactions   Ciprofloxacin Other (See Comments)    Patient doesn't like the way it makes him feel   Zestril [Lisinopril]     unknown   Sulfa Antibiotics Rash    Chief Complaint  Patient presents with   Acute Visit    Patient presents today for a follow-up UTI from June,2023     HPI: Patient is a 88 y.o. male patient is here to follow-up UTI.  Since I saw him several weeks ago after a fall he has continued in some decline both mentally and physically.  His daughter, who lives in [place].  Is spending much of the time with him.  Even though he is independent living he is not remembering to take his medicines correctly.  He is not eating as he should and spends most of the time in his apartment.  The nurse at [facility] checked on him recently and he failed to come to the door.  There is some very real concerns or questions that he may be not safe to live in his independent environment. He was seen at an urgent care recently and found to have urinary tract infection and strict treated with cephalexin.  He has been on that about 3 to 4 days.  He does seem more confused and not able to understand the discussion we had at his visit.  Daughter is aware that assisted living may be imminent in reviewing some records he had an MMSE done 4 years ago and scored 29.  3 months ago he scored 24.  While he is living in assisted living over the next 5 days I have asked for that to be repeated and would not be surprised to see it even lower than the 24 recorded 3 months ago.  Much of his decline has happened since his wife has moved to memory  care.  Review of Systems:  Review of Systems  Constitutional:  Positive for malaise/fatigue.  HENT:  Positive for hearing loss.   Cardiovascular: Negative.   Gastrointestinal: Negative.   Musculoskeletal:  Positive for back pain.  Psychiatric/Behavioral:  Positive for depression and memory loss.     Past Medical History:  Diagnosis Date   Atrial fibrillation ([place]) 03/26/2011   Basal cell carcinoma of other specified sites of skin 4/10/012   Basal cell carcinoma of skin, site unspecified 03/25/2012   Benign neoplasm of colon 03/25/2012   adenomatous polyps   Broken arm 11/25/2017   Colon polyp 2014   Degeneration of lumbar or lumbosacral intervertebral disc 09/24/2011   Disturbance of skin sensation 01/28/2012   Diverticulosis 2014   Dysphagia, unspecified(787.20) 03/26/2011   Elevated prostate specific antigen (PSA) 03/26/2011   Exposure to unspecified radiation 03/26/2011   Family history of colon cancer 07/16/2013   GERD (gastroesophageal reflux disease) 07/16/2013   Hearing deficit 12/21/2013   Hemangioma of unspecified site 03/26/2011   Hx of adenomatous colonic polyps 07/16/2013   Hypertrophy of prostate with urinary obstruction and other lower urinary tract symptoms (LUTS) 03/26/2011   Ingrowing nail 03/26/2011   Internal hemorrhoids without mention of complication 07/23/88   Keratosis, actinic 01/28/2012   Long term (current) use  of anticoagulants 03/26/2011   Long term current use of anticoagulant therapy 07/23/2016   Lumbago 03/26/2011   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   Osteoarthrosis, unspecified whether generalized or localized, unspecified site 11/24/2012   Other and unspecified hyperlipidemia 03/26/2011   Other malaise and fatigue 03/26/2011   Pain in joint, forearm 11/24/2012   Pain in joint, lower leg 06/15/13   left knee   Paresthesia of left arm 07/05/2014   Rectal bleed 06/15/13   Reflux esophagitis 03/26/2011   Sacroiliitis, not elsewhere classified  ([place]) 05/28/2011   Scoliosis (and kyphoscoliosis), idiopathic 05/28/2011   Squamous cell carcinoma of skin of upper limb, including shoulder 03/26/2011   Tinnitus 12/21/2013   Transient ischemic attack (TIA), and cerebral infarction without residual deficits(V12.54) 03/26/2011   Unspecified essential hypertension 03/26/2011   Past Surgical History:  Procedure Laterality Date   BASAL CELL CARCINOMA EXCISION Left 12/10/14   Dr. Jarome Matin   COLONOSCOPY  08/12/2013   Dr. Hilarie Fredrickson adenomatous polyps   CRYOTHERAPY Left 2009   hand   PHOTODYNAMIC THERAPY  09/26/14   face and ear lobs  Dr. Jarome Matin   SQUAMOUS CELL CARCINOMA EXCISION Left 2010   hand   Social History:   reports that he has never smoked. He has never used smokeless tobacco. He reports that he does not drink alcohol and does not use drugs.  Family History  Problem Relation Age of Onset   Cancer Mother        colon   Heart disease Father        MI    Medications: Patient's Medications  New Prescriptions   ESCITALOPRAM (LEXAPRO) 10 MG TABLET    Take 1 tablet (10 mg total) by mouth daily.  Previous Medications   ACETAMINOPHEN (TYLENOL) 500 MG TABLET    Take 500 mg by mouth daily as needed.   CEPHALEXIN 500 MG TABLET    Take 500 mg by mouth 3 (three) times daily.   ELIQUIS 5 MG TABS TABLET    TAKE 1 TABLET TWICE A DAY FOR ANTICOAGULATION   FAMOTIDINE (PEPCID) 20 MG TABLET    Take 20 mg by mouth as needed for heartburn or indigestion.   FINASTERIDE (PROSCAR) 5 MG TABLET    TAKE 1 TABLET DAILY FOR URINATING DRIBBLING/PROSTATE ENLARGEMENT   LEVOTHYROXINE (SYNTHROID) 25 MCG TABLET    TAKE 1 TABLET DAILY BEFORE BREAKFAST   METOPROLOL SUCCINATE (TOPROL-XL) 50 MG 24 HR TABLET    TAKE 1 TABLET TWICE A DAY   WITH OR IMMEDIATELY FOLLOWING A MEAL TO CONTROL BLOOD PRESSURE AND HEART RHYTHM   MULTIPLE VITAMINS-MINERALS (PRESERVISION AREDS) CAPS    Take 2 capsules by mouth daily.   SIMVASTATIN (ZOCOR) 10 MG TABLET    TAKE 1 TABLET DAILY TO  LOWER CHOLESTEROL   TERAZOSIN (HYTRIN) 2 MG CAPSULE    TAKE 1 CAPSULE DAILY   VALSARTAN (DIOVAN) 160 MG TABLET    TAKE 1 TABLET DAILY  Modified Medications   No medications on file  Discontinued Medications   No medications on file    Physical Exam:  Vitals:   06/19/22 1531  BP: 118/72  Pulse: 86  Temp: 98 F (36.7 C)  SpO2: 97%  Weight: 186 lb 6.4 oz (84.6 kg)  Height: '5\' 7"'$  (1.702 m)   Body mass index is 29.19 kg/m. Wt Readings from Last 3 Encounters:  06/19/22 186 lb 6.4 oz (84.6 kg)  06/05/22 184 lb 6.4 oz (83.6 kg)  05/29/22  187 lb 12.8 oz (85.2 kg)    Physical Exam Vitals and nursing note reviewed.  Cardiovascular:     Rate and Rhythm: Normal rate. Rhythm irregular.  Pulmonary:     Effort: Pulmonary effort is normal.     Breath sounds: Normal breath sounds.  Neurological:     General: No focal deficit present.     Mental Status: He is alert and oriented to person, place, and time.  Psychiatric:        Mood and Affect: Mood normal.     Comments: As noted above, I think there have been some changes in his cognitive awareness.  He is very agreeable and suggestible and does what ever his daughter asks of him but left his own devices would remain apathetic toward any activities eating or self-care.     Labs reviewed: Basic Metabolic Panel: Recent Labs    10/17/21 0843 04/18/22 0850  NA 141 140  K 3.8 3.8  CL 108 105  CO2 26 28  GLUCOSE 97 115*  BUN 14 17  CREATININE 1.18 1.08  CALCIUM 9.0 9.6  TSH  --  4.03   Liver Function Tests: Recent Labs    10/17/21 0843 04/18/22 0850  AST 16 18  ALT 10 12  BILITOT 0.8 0.5  PROT 6.4 6.7   No results for input(s): "LIPASE", "AMYLASE" in the last 8760 hours. No results for input(s): "AMMONIA" in the last 8760 hours. CBC: Recent Labs    10/17/21 0843 04/18/22 0850  WBC 7.1 7.7  NEUTROABS 4,196 5,529  HGB 15.0 15.2  HCT 43.3 44.1  MCV 96.2 95.2  PLT 185 187   Lipid Panel: Recent Labs     10/17/21 0843 04/18/22 0850  CHOL 117 130  HDL 43 44  LDLCALC 54 65  TRIG 116 120  CHOLHDL 2.7 3.0   TSH: Recent Labs    04/18/22 0850  TSH 4.03   A1C: No results found for: "HGBA1C"   Assessment  1. Longstanding persistent atrial fibrillation (HCC) Rhythm is irregular but rate is controlled and he continues with Eliquis  2. Essential hypertension Blood pressure is good today 118/72  3. Mixed hyperlipidemia  Lipids were assessed in May and LDL was at goal at 65 4. Mild cognitive impairment There is an element of delirium here possibly related to the urinary tract infection, possibly related to the back pain after fall (there are no broken vein bones) and possibly related to depression.  We will spend the next week or so in assisted living.  Begin some physical therapy for strengthening in hopes of preventing another fall.  He is to continue with antibiotic for urinary tract infection.  Repeat MMSE will be done.  And Lexapro will be started.  I do not expect to see the effects of Lexapro for 2 to 4 weeks after it has begun but hopefully that will will improve his apathy as well as cognitive abilities.   Alain Honey, MD [facility] [phone]

## 2022-06-20 ENCOUNTER — Emergency Department (HOSPITAL_COMMUNITY): Payer: Medicare Other

## 2022-06-20 ENCOUNTER — Emergency Department (HOSPITAL_COMMUNITY)
Admission: EM | Admit: 2022-06-20 | Discharge: 2022-06-21 | Disposition: A | Discharge status: Home / Self Care | Payer: Medicare Other | Attending: Physician Assistant | Admitting: Physician Assistant

## 2022-06-20 DIAGNOSIS — R531 Weakness: Secondary | ICD-10-CM | POA: Insufficient documentation

## 2022-06-20 DIAGNOSIS — R5383 Other fatigue: Secondary | ICD-10-CM | POA: Insufficient documentation

## 2022-06-20 DIAGNOSIS — R918 Other nonspecific abnormal finding of lung field: Secondary | ICD-10-CM | POA: Diagnosis not present

## 2022-06-20 DIAGNOSIS — Z5321 Procedure and treatment not carried out due to patient leaving prior to being seen by health care provider: Secondary | ICD-10-CM | POA: Diagnosis not present

## 2022-06-20 DIAGNOSIS — R630 Anorexia: Secondary | ICD-10-CM | POA: Insufficient documentation

## 2022-06-20 DIAGNOSIS — R4182 Altered mental status, unspecified: Secondary | ICD-10-CM | POA: Diagnosis not present

## 2022-06-20 LAB — CBC WITH DIFFERENTIAL/PLATELET
Abs Immature Granulocytes: 0.08 10*3/uL — ABNORMAL HIGH (ref 0.00–0.07)
Basophils Absolute: 0 10*3/uL (ref 0.0–0.1)
Basophils Relative: 0 %
Eosinophils Absolute: 0.1 10*3/uL (ref 0.0–0.5)
Eosinophils Relative: 1 %
HCT: 45.3 % (ref 39.0–52.0)
Hemoglobin: 15.2 g/dL (ref 13.0–17.0)
Immature Granulocytes: 1 %
Lymphocytes Relative: 8 %
Lymphs Abs: 1.1 10*3/uL (ref 0.7–4.0)
MCH: 32.5 pg (ref 26.0–34.0)
MCHC: 33.6 g/dL (ref 30.0–36.0)
MCV: 96.8 fL (ref 80.0–100.0)
Monocytes Absolute: 1.1 10*3/uL — ABNORMAL HIGH (ref 0.1–1.0)
Monocytes Relative: 8 %
Neutro Abs: 10.9 10*3/uL — ABNORMAL HIGH (ref 1.7–7.7)
Neutrophils Relative %: 82 %
Platelets: 175 10*3/uL (ref 150–400)
RBC: 4.68 MIL/uL (ref 4.22–5.81)
RDW: 12.8 % (ref 11.5–15.5)
WBC: 13.3 10*3/uL — ABNORMAL HIGH (ref 4.0–10.5)
nRBC: 0 % (ref 0.0–0.2)

## 2022-06-20 LAB — COMPREHENSIVE METABOLIC PANEL
ALT: 12 U/L (ref 0–44)
AST: 19 U/L (ref 15–41)
Albumin: 3.5 g/dL (ref 3.5–5.0)
Alkaline Phosphatase: 79 U/L (ref 38–126)
Anion gap: 10 (ref 5–15)
BUN: 14 mg/dL (ref 8–23)
CO2: 24 mmol/L (ref 22–32)
Calcium: 9.2 mg/dL (ref 8.9–10.3)
Chloride: 104 mmol/L (ref 98–111)
Creatinine, Ser: 1.05 mg/dL (ref 0.61–1.24)
GFR, Estimated: >60 mL/min (ref >60)
Glucose, Bld: 109 mg/dL — ABNORMAL HIGH (ref 70–99)
Potassium: 4.6 mmol/L (ref 3.5–5.1)
Sodium: 138 mmol/L (ref 135–145)
Total Bilirubin: 1.4 mg/dL — ABNORMAL HIGH (ref 0.3–1.2)
Total Protein: 6.8 g/dL (ref 6.5–8.1)

## 2022-06-20 LAB — LIPASE, BLOOD: Lipase: 33 U/L (ref 11–51)

## 2022-06-20 NOTE — ED Provider Triage Note (Signed)
Emergency Medicine Provider Triage Evaluation Note  Phillip [name] , a 88 y.o. male  was evaluated in triage.  Pt complains of gradually worsening decline in performing ADLs, decreased appetite, increased fatigue, decreased ability to ambulate.  Symptoms have been going on for several months.  Is trying to get in at a long-term care facility but was unable to do so.  Denies any chest pain or headache.  Review of Systems  Positive: Above Negative: Chest pain or headache  Physical Exam  BP 98/75   Pulse 83   Temp 97.9 F (36.6 C) (Oral)   Resp 17   SpO2 97%  Gen:   Awake, no distress   Resp:  Normal effort  MSK:   Moves extremities without difficulty  Other:  Able to answer questions without difficulty.  Moving all extremities.  No facial asymmetry.  Normal speech  Medical Decision Making  Medically screening exam initiated at 2:59 PM.  Appropriate orders placed.  Phillip [name] was informed that the remainder of the evaluation will be completed by another provider, this initial triage assessment does not replace that evaluation, and the importance of remaining in the ED until their evaluation is complete.  Labs and work-up initiated   Delia Heady, [place] 06/20/22 1501

## 2022-06-20 NOTE — ED Triage Notes (Signed)
Pt c/o gradual weakening, decreased appetite, increased fatigue. Worsening ability to perform ADLs, decreased ability to ambulate. PT A&O4  Hx UTI, detected last Friday

## 2022-06-20 NOTE — ED Notes (Signed)
Patient left on own accord °

## 2022-06-21 ENCOUNTER — Telehealth: Payer: Self-pay

## 2022-06-21 NOTE — Telephone Encounter (Signed)
daughter is concerned to patient has a UTI that wasn't noticed until last Friday. Recently went to urgent care and was prescribed cephalexin and has been having hallucinations for the last 2 days. Concerns hallucination could be a side affect. [place]

## 2022-06-22 ENCOUNTER — Other Ambulatory Visit: Payer: Self-pay

## 2022-06-22 ENCOUNTER — Encounter (HOSPITAL_BASED_OUTPATIENT_CLINIC_OR_DEPARTMENT_OTHER): Payer: Self-pay | Admitting: Emergency Medicine

## 2022-06-22 ENCOUNTER — Emergency Department (HOSPITAL_BASED_OUTPATIENT_CLINIC_OR_DEPARTMENT_OTHER)
Admission: EM | Admit: 2022-06-22 | Discharge: 2022-06-22 | Disposition: A | Discharge status: Home / Self Care | Payer: Medicare Other | Attending: Emergency Medicine | Admitting: Emergency Medicine

## 2022-06-22 DIAGNOSIS — Z7901 Long term (current) use of anticoagulants: Secondary | ICD-10-CM | POA: Diagnosis not present

## 2022-06-22 DIAGNOSIS — Z79899 Other long term (current) drug therapy: Secondary | ICD-10-CM | POA: Insufficient documentation

## 2022-06-22 DIAGNOSIS — I4819 Other persistent atrial fibrillation: Secondary | ICD-10-CM | POA: Insufficient documentation

## 2022-06-22 DIAGNOSIS — R441 Visual hallucinations: Secondary | ICD-10-CM | POA: Insufficient documentation

## 2022-06-22 DIAGNOSIS — I4891 Unspecified atrial fibrillation: Secondary | ICD-10-CM | POA: Diagnosis not present

## 2022-06-22 DIAGNOSIS — I10 Essential (primary) hypertension: Secondary | ICD-10-CM | POA: Insufficient documentation

## 2022-06-22 DIAGNOSIS — R531 Weakness: Secondary | ICD-10-CM | POA: Diagnosis not present

## 2022-06-22 LAB — CBC
HCT: 43.1 % (ref 39.0–52.0)
Hemoglobin: 14.6 g/dL (ref 13.0–17.0)
MCH: 32.8 pg (ref 26.0–34.0)
MCHC: 33.9 g/dL (ref 30.0–36.0)
MCV: 96.9 fL (ref 80.0–100.0)
Platelets: 201 10*3/uL (ref 150–400)
RBC: 4.45 MIL/uL (ref 4.22–5.81)
RDW: 13.1 % (ref 11.5–15.5)
WBC: 8.3 10*3/uL (ref 4.0–10.5)
nRBC: 0 % (ref 0.0–0.2)

## 2022-06-22 LAB — URINALYSIS, ROUTINE W REFLEX MICROSCOPIC
Bilirubin Urine: NEGATIVE
Glucose, UA: NEGATIVE mg/dL
Hgb urine dipstick: NEGATIVE
Ketones, ur: NEGATIVE mg/dL
Nitrite: NEGATIVE
Specific Gravity, Urine: 1.028 (ref 1.005–1.030)
pH: 5 (ref 5.0–8.0)

## 2022-06-22 LAB — BASIC METABOLIC PANEL
Anion gap: 9 (ref 5–15)
BUN: 17 mg/dL (ref 8–23)
CO2: 27 mmol/L (ref 22–32)
Calcium: 9.9 mg/dL (ref 8.9–10.3)
Chloride: 103 mmol/L (ref 98–111)
Creatinine, Ser: 1.18 mg/dL (ref 0.61–1.24)
GFR, Estimated: 58 mL/min — ABNORMAL LOW (ref >60)
Glucose, Bld: 107 mg/dL — ABNORMAL HIGH (ref 70–99)
Potassium: 4.3 mmol/L (ref 3.5–5.1)
Sodium: 139 mmol/L (ref 135–145)

## 2022-06-22 LAB — CBG MONITORING, ED: Glucose-Capillary: 126 mg/dL — ABNORMAL HIGH (ref 70–99)

## 2022-06-22 MED ORDER — LACTATED RINGERS IV BOLUS
500.0000 mL | Freq: Once | INTRAVENOUS | Status: AC
  Administered 2022-06-22: 500 mL via INTRAVENOUS

## 2022-06-22 NOTE — ED Provider Notes (Signed)
[facility] EMERGENCY DEPT Provider Note   CSN: [id number] Arrival date & time: 06/22/22  1523     History  Chief Complaint  Patient presents with   Weakness   Hallucinations    Phillip [name] is a 88 y.o. male.  HPI     88 year old male comes to the ER with chief complaint of weakness and hallucinations.  Patient has history of A-fib and is on Eliquis for that, hypertension, GERD and BPH.  He was recently diagnosed with UTI and just finished a 7-day course of Keflex.  He resides at an independent living facility, and is accompanied by his 2 daughters.  Patient's daughter indicates that over the last month they have noticed that patient has had a rapid decline.  He is not as active, complains of feeling weak and has not been participating in normal activities including eating normal amount of food.  He wants to stay in his room and not come out, which is unusual.  They went to the urgent care last week, he was diagnosed with UTI and started on Keflex.  3 days ago patient started experiencing visual hallucinations which is new.  Patient states that he will see people that are singing or talking amongst themselves.  The voices are not speaking to him and he is aware that they are not real.  He will also see distortion and objects such as some objects looking extremely large and others quite small.   PCP had recently started patient also on Lexapro, which family has not initiated.  Review of system is also positive for worsening balance.  Patient was seen in the ER when the hallucinations first began, CT scan of the brain was done along with basic work-up.  After waiting for over 6 hours family went home without getting evaluated.   Home Medications Prior to Admission medications   Medication Sig Start Date End Date Taking? Authorizing Provider  acetaminophen (TYLENOL) 500 MG tablet Take 500 mg by mouth daily as needed.    [provider]  Cephalexin 500 MG tablet  Take 500 mg by mouth 3 (three) times daily. 06/14/22   [provider]  ELIQUIS 5 MG TABS tablet TAKE 1 TABLET TWICE A DAY FOR ANTICOAGULATION 09/13/21   Virgie Dad, MD  escitalopram (LEXAPRO) 10 MG tablet Take 1 tablet (10 mg total) by mouth daily. 06/19/22   Wardell Honour, MD  famotidine (PEPCID) 20 MG tablet Take 20 mg by mouth as needed for heartburn or indigestion.    [provider]  finasteride (PROSCAR) 5 MG tablet TAKE 1 TABLET DAILY FOR URINATING DRIBBLING/PROSTATE ENLARGEMENT 06/19/22   Wardell Honour, MD  levothyroxine (SYNTHROID) 25 MCG tablet TAKE 1 TABLET DAILY BEFORE BREAKFAST 01/07/22   Virgie Dad, MD  metoprolol succinate (TOPROL-XL) 50 MG 24 hr tablet TAKE 1 TABLET TWICE A DAY   WITH OR IMMEDIATELY FOLLOWING A MEAL TO CONTROL BLOOD PRESSURE AND HEART RHYTHM 10/29/21   Virgie Dad, MD  Multiple Vitamins-Minerals (PRESERVISION AREDS) CAPS Take 2 capsules by mouth daily.    [provider]  simvastatin (ZOCOR) 10 MG tablet TAKE 1 TABLET DAILY TO LOWER CHOLESTEROL 03/12/22   Virgie Dad, MD  terazosin (HYTRIN) 2 MG capsule TAKE 1 CAPSULE DAILY 06/10/22   Virgie Dad, MD  valsartan (DIOVAN) 160 MG tablet TAKE 1 TABLET DAILY 03/25/22   Virgie Dad, MD      Allergies    Ciprofloxacin, Zestril [lisinopril], and Sulfa antibiotics  Review of Systems   Review of Systems  All other systems reviewed and are negative.   Physical Exam Updated Vital Signs BP 103/85 (BP Location: Right Arm)   Pulse 98   Temp 98.7 F (37.1 C) (Oral)   Resp 17   SpO2 98%  Physical Exam Vitals and nursing note reviewed.  Constitutional:      Appearance: He is well-developed.  HENT:     Head: Atraumatic.  Eyes:     Extraocular Movements: Extraocular movements intact.     Pupils: Pupils are equal, round, and reactive to light.     Comments: Nystagmus with right-sided gaze, unilateral  Cardiovascular:     Rate and Rhythm: Normal rate. Rhythm  irregular.  Pulmonary:     Effort: Pulmonary effort is normal.  Abdominal:     Tenderness: There is no abdominal tenderness.  Musculoskeletal:     Cervical back: Neck supple.  Skin:    General: Skin is warm.  Neurological:     Mental Status: He is alert and oriented to person, place, and time.     Cranial Nerves: No cranial nerve deficit.     Sensory: No sensory deficit.     Motor: No weakness.     Coordination: Coordination normal.     ED Results / Procedures / Treatments   Labs (all labs ordered are listed, but only abnormal results are displayed) Labs Reviewed  BASIC METABOLIC PANEL - Abnormal; Notable for the following components:      Result Value   Glucose, Bld 107 (*)    GFR, Estimated 58 (*)    All other components within normal limits  URINALYSIS, ROUTINE W REFLEX MICROSCOPIC - Abnormal; Notable for the following components:   Protein, ur TRACE (*)    Leukocytes,Ua TRACE (*)    All other components within normal limits  CBG MONITORING, ED - Abnormal; Notable for the following components:   Glucose-Capillary 126 (*)    All other components within normal limits  URINE CULTURE  CBC    EKG EKG Interpretation  Date/Time:  Saturday June 22 2022 15:53:20 EDT Ventricular Rate:  86 PR Interval:  160 QRS Duration: 98 QT Interval:  357 QTC Calculation: 427 R Axis:   -1 Text Interpretation: Atrial fibrillation normal intervals No acute changes No old tracing to compare Confirmed by Varney Biles [phone]) on 06/22/2022 5:27:26 PM  Radiology No results found.  Procedures Procedures    Medications Ordered in ED Medications  lactated ringers bolus 500 mL (500 mLs Intravenous New Bag/Given 06/22/22 1721)    ED Course/ Medical Decision Making/ A&P Clinical Course as of 06/22/22 1938  Sat Jun 22, 2022  1937 Urinalysis, Routine w reflex microscopic Urine, Clean Catch(!) Shows no clear evidence of any infection.  Clinically not UTI either. [AN]  1062 Basic  metabolic panel(!) Normal metabolic profile. [AN]    Clinical Course User Index [AN] Varney Biles, MD                           Medical Decision Making Amount and/or Complexity of Data Reviewed Labs: ordered.   This patient presents to the ED with chief complaint(s) of weakness that is subacute with acute hallucinations pertinent past medical history of A-fib on Eliquis, recent diagnosis of UTI which further complicates the presenting complaint. The complaint involves an extensive differential diagnosis and also carries with it a high risk of complications and morbidity.    Family provides substantial history.  I have reviewed patient's primary care visit notes from recent past and also reviewed imaging from few days back.  CT scan of the brain was negative for any acute findings at that time.  Recent lab work-up also reviewed.  It appears to me that most likely those 2 symptoms are unrelated.  Patient has generalized weakness that has been present for last several days.  The reason could be metabolic, such as hypothyroidism or age-related neurocognitive decline.  The other possibility that is more organic is A-fib.  Patient noted to be constantly in A-fib now.  He is not experiencing any palpitations or chest pain, but weakness, poor balance could be hallmark of symptomatic A-fib.  Fortunately, he is rate controlled right now.  We will advised that he follows up with his PCP specifically asking if the generalized weakness is because of transition of the patient to permanent A-fib and evaluation by them to see if he is symptomatic from it.   I do not think he is having clinical UTI.  UA has been sent. I reviewed patient's labs from recent ER visit and a CT scan of his brain that was completed at that time, they were all reassuring. Repeat blood work has been sent to make sure there is no new changes.  The hallucinations are new Besides cephalexin, patient has not started any new  medications.  Although UTI can cause hallucinations, I do not think they are the cause for this patient.  I looked up cephalexin and it does not have high propensity towards causing hallucinations.  Moreover, he has just finished the course, therefore his symptoms will resolve on its own if the symptoms are induced by cephalexin.  Other possibility for hallucination again include neurocognitive delay, delirium, polypharmacy, metabolic etiologies.  I did discuss the case with Dr. Quinn Axe, she thinks it would be appropriate for patient to follow-up with neurology as an outpatient.  Results of the ER work-up and the findings here discussed with the family. Repeat CT scan not indicated for this visit.  Final Clinical Impression(s) / ED Diagnoses Final diagnoses:  Visual hallucinations  Persistent atrial fibrillation (HCC)  Generalized weakness    Rx / DC Orders ED Discharge Orders          Ordered    Ambulatory referral to Neurology       Comments: An appointment is requested in approximately: 2 weeks   06/22/22 1843              Varney Biles, MD 06/22/22 [phone]

## 2022-06-22 NOTE — ED Triage Notes (Signed)
Pt POV with daughters-  Per daughter- pt with increased lethargy x1 month.  1 week ago dx with UTI, finished antibiotics this AM. Auditory/visual Hallucinations reported since Thursday.  Pt with increased weakness, unbalanced.

## 2022-06-22 NOTE — Discharge Instructions (Addendum)
We saw in the ER for generalized weakness and visual hallucinations  The work-up in the emergency room is reassuring.  Urine analysis looks clean.  Lab work does not reveal any acute concerns such as elevated white count, severe anemia, electrolyte abnormality or renal failure.  It appears that you have 2 separate concurrent processes going on.  The visual hallucination is new, and will need neurology follow-up for further neuropsychiatric evaluation.  The CT scan brain that was completed recently was reassuring.  The weakness is unclear.  It could be part of aging, but other possibility includes atrial fibrillation that has worsened.  We recommend that you follow-up with your primary care doctor for the weakness part and specifically discuss with them if they think A-fib is the cause for the generalized weakness.  If they agree, then you will need further assistance in managing it by either the primary care doctor or a cardiologist.

## 2022-06-24 ENCOUNTER — Encounter: Payer: Medicare Other | Admitting: Orthopedic Surgery

## 2022-06-24 LAB — URINE CULTURE: Culture: NO GROWTH

## 2022-06-25 ENCOUNTER — Encounter: Payer: Self-pay | Admitting: Internal Medicine

## 2022-06-25 ENCOUNTER — Non-Acute Institutional Stay: Payer: Medicare Other | Admitting: Internal Medicine

## 2022-06-25 DIAGNOSIS — R4189 Other symptoms and signs involving cognitive functions and awareness: Secondary | ICD-10-CM | POA: Diagnosis not present

## 2022-06-25 DIAGNOSIS — E039 Hypothyroidism, unspecified: Secondary | ICD-10-CM | POA: Diagnosis not present

## 2022-06-25 DIAGNOSIS — R350 Frequency of micturition: Secondary | ICD-10-CM

## 2022-06-25 DIAGNOSIS — N3946 Mixed incontinence: Secondary | ICD-10-CM | POA: Diagnosis not present

## 2022-06-25 DIAGNOSIS — M25512 Pain in left shoulder: Secondary | ICD-10-CM | POA: Diagnosis not present

## 2022-06-25 DIAGNOSIS — F3341 Major depressive disorder, recurrent, in partial remission: Secondary | ICD-10-CM

## 2022-06-25 DIAGNOSIS — F32A Depression, unspecified: Secondary | ICD-10-CM | POA: Diagnosis not present

## 2022-06-25 DIAGNOSIS — N401 Enlarged prostate with lower urinary tract symptoms: Secondary | ICD-10-CM | POA: Diagnosis not present

## 2022-06-25 DIAGNOSIS — E782 Mixed hyperlipidemia: Secondary | ICD-10-CM | POA: Diagnosis not present

## 2022-06-25 DIAGNOSIS — K219 Gastro-esophageal reflux disease without esophagitis: Secondary | ICD-10-CM | POA: Diagnosis not present

## 2022-06-25 DIAGNOSIS — R29898 Other symptoms and signs involving the musculoskeletal system: Secondary | ICD-10-CM | POA: Diagnosis not present

## 2022-06-25 DIAGNOSIS — R441 Visual hallucinations: Secondary | ICD-10-CM

## 2022-06-25 DIAGNOSIS — N390 Urinary tract infection, site not specified: Secondary | ICD-10-CM | POA: Diagnosis not present

## 2022-06-25 DIAGNOSIS — R2689 Other abnormalities of gait and mobility: Secondary | ICD-10-CM | POA: Diagnosis not present

## 2022-06-25 DIAGNOSIS — M6281 Muscle weakness (generalized): Secondary | ICD-10-CM | POA: Diagnosis not present

## 2022-06-25 DIAGNOSIS — I4811 Longstanding persistent atrial fibrillation: Secondary | ICD-10-CM | POA: Diagnosis not present

## 2022-06-25 DIAGNOSIS — I10 Essential (primary) hypertension: Secondary | ICD-10-CM

## 2022-06-25 DIAGNOSIS — R41841 Cognitive communication deficit: Secondary | ICD-10-CM | POA: Diagnosis not present

## 2022-06-25 DIAGNOSIS — Z636 Dependent relative needing care at home: Secondary | ICD-10-CM | POA: Diagnosis not present

## 2022-06-25 DIAGNOSIS — R1312 Dysphagia, oropharyngeal phase: Secondary | ICD-10-CM | POA: Diagnosis not present

## 2022-06-25 NOTE — Progress Notes (Signed)
Provider:  Veleta Miners MD Location:   [place] Room Number: 323 Place of Service:  ALF ([phone])  PCP: Virgie Dad, MD Patient Care Team: Virgie Dad, MD as PCP - General (Internal Medicine) Jarome Matin, MD as Consulting Physician (Dermatology) [place], [facility] Wardell Honour, MD as Attending Physician [facility] [place] [facility])  Extended Emergency Contact Information Primary Emergency Contact: [facility] Address: [address], [place] [address] [place] Phone: [phone] Relation: Daughter Secondary Emergency Contact: Filiberto Pinks Address: [address]          [place], [place] [address] [place] Phone: [phone] Relation: Spouse  Code Status: Full Code Goals of Care: Advanced Directive information    06/25/2022   10:36 AM  Advanced Directives  Does Patient Have a Medical Advance Directive? No;Yes  Type of Advance Directive Living will  Does patient want to make changes to medical advance directive? No - Patient declined      Chief Complaint  Patient presents with   New Admit To SNF    Admission to AL    HPI: Patient is a 88 y.o. male seen today for admission to [facility]  Patient is admitted in AL for now as he is unable to manage his meds in his apartment  Patient has h/o Hypertension , HLD, Atrial Fibrillation, And Right Shoulder Arthritis And now recent diagnosis of macular degeneration dry  Patient with above history has had rapid decline in past few weeks First he had fall followed by visit to urgent care on 07/06 for Physical decline  Was diagnosed with UTI and started on keflex But then on 07/08 was seen in ED for Visual Hallucinations CT scan of head was negative. Repeat Urine cultures were negative All labs were normal  He was transferred to AL as he is unable to take care of himself and confused about his meds This is all new for him His  Hallucinations Visual are better but he is still having it  Very aware of distorted figures and seeing things which are not there Walking well. Using Walker sometimes Pain in left shoulder is better also No Dizziness or any focal deficits MMSE 27/30 in Facility 1/3 in recall Past Medical History:  Diagnosis Date   Atrial fibrillation ([place]) 03/26/2011   Basal cell carcinoma of other specified sites of skin 4/10/012   Basal cell carcinoma of skin, site unspecified 03/25/2012   Benign neoplasm of colon 03/25/2012   adenomatous polyps   Broken arm 11/25/2017   Colon polyp 2014   Degeneration of lumbar or lumbosacral intervertebral disc 09/24/2011   Disturbance of skin sensation 01/28/2012   Diverticulosis 2014   Dysphagia, unspecified(787.20) 03/26/2011   Elevated prostate specific antigen (PSA) 03/26/2011   Exposure to unspecified radiation 03/26/2011   Family history of colon cancer 07/16/2013   GERD (gastroesophageal reflux disease) 07/16/2013   Hearing deficit 12/21/2013   Hemangioma of unspecified site 03/26/2011   Hx of adenomatous colonic polyps 07/16/2013   Hypertrophy of prostate with urinary obstruction and other lower urinary tract symptoms (LUTS) 03/26/2011   Ingrowing nail 03/26/2011   Internal hemorrhoids without mention of complication 1/0/62   Keratosis, actinic 01/28/2012   Long term (current) use of anticoagulants 03/26/2011   Long term current use of anticoagulant therapy 07/23/2016   Lumbago 03/26/2011   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) 05/28/2011   Scoliosis  (and kyphoscoliosis), idiopathic 05/28/2011   Squamous cell carcinoma of skin of upper limb, including shoulder 03/26/2011   Tinnitus 12/21/2013   Transient ischemic attack (TIA), and cerebral infarction without residual deficits(V12.54) 03/26/2011   Unspecified essential hypertension 03/26/2011   Past Surgical History:  Procedure Laterality Date   BASAL CELL CARCINOMA EXCISION Left 12/10/14   Dr. Jarome Matin   COLONOSCOPY  08/12/2013   Dr. Hilarie Fredrickson adenomatous polyps   CRYOTHERAPY Left 2009   hand   PHOTODYNAMIC THERAPY  09/26/14   face and ear lobs  Dr. Jarome Matin   SQUAMOUS CELL CARCINOMA EXCISION Left 2010   hand    reports that he has never smoked. He has never used smokeless tobacco. He reports that he does not drink alcohol and does not use drugs. 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    Employer: RETIRED  Tobacco Use   Smoking status: Never   Smokeless tobacco: Never  Vaping Use   Vaping Use: Never used  Substance and Sexual Activity   Alcohol use: No   Drug use: No   Sexual activity: Not on file  Other Topics Concern   Not on file  Social History Narrative   [facility] at [facility] since 11/[facility]   Married - [employer]   Does not have Living Will   Never smoked   Alcohol none   Exercise: tennis, pool, table tennis 2-3 times a week   Social Determinants of Health   Financial Resource Strain: Low Risk  (03/25/2022)   Overall Financial Resource Strain (CARDIA)    Difficulty of Paying Living Expenses: Not hard at all  Food Insecurity: No Food Insecurity (03/25/2022)   Hunger Vital Sign    Worried About Running Out of Food in the Last Year: Never true    [place] in the Last Year: Never true  Transportation Needs: No  Transportation Needs (03/25/2022)   PRAPARE - Hydrologist (Medical): No    Lack of Transportation (Non-Medical): No  Physical Activity: Inactive (03/25/2022)   Exercise Vital Sign    Days of Exercise per Week: 0 days    Minutes of Exercise per Session: 0 min  Stress: No Stress Concern Present (03/25/2022)   [place]    Feeling of Stress : Only a little  Social Connections: Moderately Isolated (03/25/2022)   Social Connection and Isolation Panel [NHANES]    Frequency of Communication with Friends and Family: More than three times a week    Frequency of Social Gatherings with Friends and Family: More than three times a week    Attends Religious Services: Never    Marine scientist or Organizations: No    Attends Archivist Meetings: Never  Marital Status: Married  Human resources officer Violence: Not At Risk (03/25/2022)   Humiliation, Afraid, Rape, and Kick questionnaire    Fear of Current or Ex-Partner: No    Emotionally Abused: No    Physically Abused: No    Sexually Abused: No    Functional Status Survey:    Family History  Problem Relation Age of Onset   Cancer Mother        colon   Heart disease Father        MI    Health Maintenance  Topic Date Due   COVID-19 Vaccine (4 - Booster for Moderna series) 11/13/2022 (Originally 10/31/2021)   INFLUENZA VACCINE  07/16/2022   TETANUS/TDAP  09/04/2026   Pneumonia Vaccine 54+ Years old  Completed   Zoster Vaccines- Shingrix  Completed   HPV VACCINES  Aged Out   COLONOSCOPY (Pts 45-55yr Insurance coverage will need to be confirmed)  Discontinued    Allergies  Allergen Reactions   Ciprofloxacin Other (See Comments)    Patient doesn't like the way it makes him feel   Zestril [Lisinopril]     unknown   Sulfa Antibiotics Rash    Allergies as of 06/25/2022       Reactions   Ciprofloxacin Other (See Comments)    Patient doesn't like the way it makes him feel   Zestril [lisinopril]    unknown   Sulfa Antibiotics Rash        Medication List        Accurate as of June 25, 2022 10:36 AM. If you have any questions, ask your nurse or doctor.          STOP taking these medications    Cephalexin 500 MG tablet Stopped by: AVirgie Dad MD       TAKE these medications    acetaminophen 500 MG tablet Commonly known as: TYLENOL Take 500 mg by mouth daily as needed.   Eliquis 5 MG Tabs tablet Generic drug: apixaban TAKE 1 TABLET TWICE A DAY FOR ANTICOAGULATION   escitalopram 10 MG tablet Commonly known as: Lexapro Take 1 tablet (10 mg total) by mouth daily.   famotidine 20 MG tablet Commonly known as: PEPCID Take 20 mg by mouth as needed for heartburn or indigestion.   finasteride 5 MG tablet Commonly known as: PROSCAR TAKE 1 TABLET DAILY FOR URINATING DRIBBLING/PROSTATE ENLARGEMENT   levothyroxine 25 MCG tablet Commonly known as: SYNTHROID TAKE 1 TABLET DAILY BEFORE BREAKFAST   metoprolol succinate 50 MG 24 hr tablet Commonly known as: TOPROL-XL TAKE 1 TABLET TWICE A DAY   WITH OR IMMEDIATELY FOLLOWING A MEAL TO CONTROL BLOOD PRESSURE AND HEART RHYTHM   PreserVision AREDS Caps Take 2 capsules by mouth daily.   simvastatin 10 MG tablet Commonly known as: ZOCOR TAKE 1 TABLET DAILY TO LOWER CHOLESTEROL   terazosin 2 MG capsule Commonly known as: HYTRIN TAKE 1 CAPSULE DAILY   valsartan 160 MG tablet Commonly known as: DIOVAN TAKE 1 TABLET DAILY        Review of Systems  Constitutional:  Positive for activity change. Negative for appetite change and unexpected weight change.  HENT: Negative.    Respiratory:  Negative for cough and shortness of breath.   Cardiovascular:  Negative for leg swelling.  Gastrointestinal:  Positive for constipation.  Genitourinary:  Negative for frequency.  Musculoskeletal:  Negative for arthralgias, gait problem and myalgias.   Skin: Negative.  Negative for rash.  Neurological:  Negative for dizziness and weakness.  Psychiatric/Behavioral:  Positive for confusion, dysphoric mood and hallucinations. Negative for sleep disturbance.   All other systems reviewed and are negative.   Vitals:   06/25/22 1031  BP: (!) 144/88  Pulse: 77  Resp: 18  Temp: (!) 97.2 F (36.2 C)  SpO2: 97%  Weight: 179 lb 3.2 oz (81.3 kg)  Height: '5\' 7"'$  (1.702 m)   Body mass index is 28.07 kg/m. Physical Exam Vitals reviewed.  Constitutional:      Appearance: Normal appearance.  HENT:     Head: Normocephalic.     Nose: Nose normal.     Mouth/Throat:     Mouth: Mucous membranes are moist.     Pharynx: Oropharynx is clear.  Eyes:     Pupils: Pupils are equal, round, and reactive to light.  Cardiovascular:     Rate and Rhythm: Normal rate. Rhythm irregular.     Pulses: Normal pulses.     Heart sounds: No murmur heard. Pulmonary:     Effort: Pulmonary effort is normal. No respiratory distress.     Breath sounds: Normal breath sounds. No rales.  Abdominal:     General: Abdomen is flat. Bowel sounds are normal.     Palpations: Abdomen is soft.  Musculoskeletal:        General: No swelling.     Cervical back: Neck supple.  Skin:    General: Skin is warm.  Neurological:     General: No focal deficit present.     Mental Status: He is alert and oriented to person, place, and time.  Psychiatric:        Mood and Affect: Mood normal.        Thought Content: Thought content normal.    Labs reviewed: Basic Metabolic Panel: Recent Labs    04/18/22 0850 06/20/22 1529 06/22/22 1557  NA 140 138 139  K 3.8 4.6 4.3  CL 105 104 103  CO2 '28 24 27  '$ GLUCOSE 115* 109* 107*  BUN '17 14 17  '$ CREATININE 1.08 1.05 1.18  CALCIUM 9.6 9.2 9.9   Liver Function Tests: Recent Labs    10/17/21 0843 04/18/22 0850 06/20/22 1529  AST '16 18 19  '$ ALT '10 12 12  '$ ALKPHOS  --   --  79  BILITOT 0.8 0.5 1.4*  PROT 6.4 6.7 6.8  ALBUMIN  --    --  3.5   Recent Labs    06/20/22 1529  LIPASE 33   No results for input(s): "AMMONIA" in the last 8760 hours. CBC: Recent Labs    10/17/21 0843 04/18/22 0850 06/20/22 1529 06/22/22 1557  WBC 7.1 7.7 13.3* 8.3  NEUTROABS 4,196 5,529 10.9*  --   HGB 15.0 15.2 15.2 14.6  HCT 43.3 44.1 45.3 43.1  MCV 96.2 95.2 96.8 96.9  PLT 185 187 175 201   Cardiac Enzymes: No results for input(s): "CKTOTAL", "CKMB", "CKMBINDEX", "TROPONINI" in the last 8760 hours. BNP: Invalid input(s): "POCBNP" No results found for: "HGBA1C" Lab Results  Component Value Date   TSH 4.03 04/18/2022   Lab Results  Component Value Date   VITAMINB12 324 08/26/2018   No results found for: "FOLATE" No results found for: "IRON", "TIBC", "FERRITIN"  Imaging and Procedures obtained prior to SNF admission: No results found.  Assessment/Plan 1. 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 Cognitive decline MMSE is 27/30 But functionally has had significant decline Depression is big Part of it Has just started Lexapro Will stay in AL till he goes through therapy D/W Daughter in details what his option are  3. Longstanding persistent atrial fibrillation (HCC) On Eliquis and Toprol  4. Acute pain of left shoulder Pain Better Using Tylenol Xrays were negative for fracture Showed DJD  5. Essential hypertension On Toprol and Diovan  6. Mixed hyperlipidemia Continue Statin  7. Hypothyroidism, unspecified type TSH normal in 5/23  8. Benign prostatic hyperplasia with urinary frequency Symptoms controlled in Proscar and Hytrin  9. Caregiver stress Wife has dementia in Memory unit  10. Recurrent major depressive disorder, in partial remission ([place]) On Lexapro. Can be  big part for his Decline Just started  Check BMP in 1 week to follow Sodium    Family/ staff Communication:   Labs/tests ordered:BMP in 2 weeks

## 2022-06-26 ENCOUNTER — Encounter: Payer: Self-pay | Admitting: Physician Assistant

## 2022-06-26 DIAGNOSIS — F32A Depression, unspecified: Secondary | ICD-10-CM | POA: Diagnosis not present

## 2022-06-26 DIAGNOSIS — R41841 Cognitive communication deficit: Secondary | ICD-10-CM | POA: Diagnosis not present

## 2022-06-26 DIAGNOSIS — R2689 Other abnormalities of gait and mobility: Secondary | ICD-10-CM | POA: Diagnosis not present

## 2022-06-26 DIAGNOSIS — R1312 Dysphagia, oropharyngeal phase: Secondary | ICD-10-CM | POA: Diagnosis not present

## 2022-06-26 DIAGNOSIS — K219 Gastro-esophageal reflux disease without esophagitis: Secondary | ICD-10-CM | POA: Diagnosis not present

## 2022-06-26 DIAGNOSIS — M6281 Muscle weakness (generalized): Secondary | ICD-10-CM | POA: Diagnosis not present

## 2022-06-28 ENCOUNTER — Non-Acute Institutional Stay: Payer: Medicare Other | Admitting: Adult Health

## 2022-06-28 ENCOUNTER — Encounter: Payer: Self-pay | Admitting: Adult Health

## 2022-06-28 DIAGNOSIS — I10 Essential (primary) hypertension: Secondary | ICD-10-CM | POA: Diagnosis not present

## 2022-06-28 DIAGNOSIS — E782 Mixed hyperlipidemia: Secondary | ICD-10-CM

## 2022-06-28 DIAGNOSIS — F329 Major depressive disorder, single episode, unspecified: Secondary | ICD-10-CM | POA: Diagnosis not present

## 2022-06-28 DIAGNOSIS — R41841 Cognitive communication deficit: Secondary | ICD-10-CM | POA: Diagnosis not present

## 2022-06-28 DIAGNOSIS — F32A Depression, unspecified: Secondary | ICD-10-CM | POA: Diagnosis not present

## 2022-06-28 DIAGNOSIS — K219 Gastro-esophageal reflux disease without esophagitis: Secondary | ICD-10-CM | POA: Diagnosis not present

## 2022-06-28 DIAGNOSIS — I4811 Longstanding persistent atrial fibrillation: Secondary | ICD-10-CM

## 2022-06-28 DIAGNOSIS — R2689 Other abnormalities of gait and mobility: Secondary | ICD-10-CM | POA: Diagnosis not present

## 2022-06-28 DIAGNOSIS — R1310 Dysphagia, unspecified: Secondary | ICD-10-CM | POA: Diagnosis not present

## 2022-06-28 DIAGNOSIS — M6281 Muscle weakness (generalized): Secondary | ICD-10-CM | POA: Diagnosis not present

## 2022-06-28 DIAGNOSIS — E039 Hypothyroidism, unspecified: Secondary | ICD-10-CM

## 2022-06-28 DIAGNOSIS — R1312 Dysphagia, oropharyngeal phase: Secondary | ICD-10-CM | POA: Diagnosis not present

## 2022-06-28 NOTE — Progress Notes (Signed)
Location:  [place] Room Number: 914-A Place of Service:  ALF [phone]) Provider:  Durenda Age, DNP, FNP-BC  Patient Care Team: Virgie Dad, MD as PCP - General (Internal Medicine) Jarome Matin, MD as Consulting Physician (Dermatology) [place], [facility] Wardell Honour, MD as Attending Physician [facility] Medicine)  Extended Emergency Contact Information Primary Emergency Contact: [name],[name] Address: [place], [place] [address] [place] Phone: [phone] Relation: Daughter Secondary Emergency Contact: Filiberto Pinks Address: [address]          [place], [place] [address] [place] Phone: [phone] Relation: Spouse  Code Status:  FULL CODE  Goals of care: Advanced Directive information    06/28/2022   12:26 PM  Advanced Directives  Does Patient Have a Medical Advance Directive? No  Does patient want to make changes to medical advance directive? No - Patient declined     Chief Complaint  Patient presents with   Acute Visit    Coughing while drinking.    HPI:  Pt is a 88 y.o. male seen today for an acute visit regarding coughing when drinking. He is a resident of [place].  He has a PMH of atrial fibrillation and is on Eliquis, hypertension, GERD and BPH. Currently, his diet is regular diet with thin liquids.    Hypothyroidism, unspecified type  takes Synthroid 25 mcg daily  Essential hypertension  -   SBPs ranging from 160-144, with outlier 163, takes valsartan 160 mg daily  Mixed hyperlipidemia  -   takes simvastatin 10 mg daily  Longstanding persistent atrial fibrillation (HCC)  -takes Eliquis 5 mg twice a day for anticoagulation and metoprolol succinate 24-hour 50 mg twice a day for rate control  Major depressive disorder with single episode, remission status unspecified  -   takes Lexapro 5 mg daily   Past Medical History:   Diagnosis Date   Atrial fibrillation ([place]) 03/26/2011   Basal cell carcinoma of other specified sites of skin 4/10/012   Basal cell carcinoma of skin, site unspecified 03/25/2012   Benign neoplasm of colon 03/25/2012   adenomatous polyps   Broken arm 11/25/2017   Colon polyp 2014   Degeneration of lumbar or lumbosacral intervertebral disc 09/24/2011   Disturbance of skin sensation 01/28/2012   Diverticulosis 2014   Dysphagia, unspecified(787.20) 03/26/2011   Elevated prostate specific antigen (PSA) 03/26/2011   Exposure to unspecified radiation 03/26/2011   Family history of colon cancer 07/16/2013   GERD (gastroesophageal reflux disease) 07/16/2013   Hearing deficit 12/21/2013   Hemangioma of unspecified site 03/26/2011   Hx of adenomatous colonic polyps 07/16/2013   Hypertrophy of prostate with urinary obstruction and other lower urinary tract symptoms (LUTS) 03/26/2011   Ingrowing nail 03/26/2011   Internal hemorrhoids without mention of complication 03/17/31   Keratosis, actinic 01/28/2012   Long term (current) use of anticoagulants 03/26/2011   Long term current use of anticoagulant therapy 07/23/2016   Lumbago 03/26/2011   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   Osteoarthrosis, unspecified whether generalized or localized, unspecified site 11/24/2012   Other and unspecified hyperlipidemia 03/26/2011   Other malaise and fatigue 03/26/2011   Pain in joint, forearm 11/24/2012   Pain in joint, lower leg 06/15/13   left knee   Paresthesia of left arm 07/05/2014   Rectal bleed 06/15/13   Reflux esophagitis 03/26/2011  Sacroiliitis, not elsewhere classified ([place]) 05/28/2011   Scoliosis (and kyphoscoliosis), idiopathic 05/28/2011   Squamous cell carcinoma of skin of upper limb, including shoulder 03/26/2011   Tinnitus 12/21/2013   Transient ischemic attack (TIA), and cerebral infarction without residual deficits(V12.54) 03/26/2011   Unspecified essential hypertension 03/26/2011   Past  Surgical History:  Procedure Laterality Date   BASAL CELL CARCINOMA EXCISION Left 12/10/14   Dr. Jarome Matin   COLONOSCOPY  08/12/2013   Dr. Hilarie Fredrickson adenomatous polyps   CRYOTHERAPY Left 2009   hand   PHOTODYNAMIC THERAPY  09/26/14   face and ear lobs  Dr. Jarome Matin   SQUAMOUS CELL CARCINOMA EXCISION Left 2010   hand    Allergies  Allergen Reactions   Ciprofloxacin Itching    Patient doesn't like the way it makes him feel   Lisinopril Itching    unknown   Sulfa Antibiotics Rash    Outpatient Encounter Medications as of 06/28/2022  Medication Sig   acetaminophen (TYLENOL) 500 MG tablet Take 500 mg by mouth daily as needed.   ELIQUIS 5 MG TABS tablet TAKE 1 TABLET TWICE A DAY FOR ANTICOAGULATION   escitalopram (LEXAPRO) 10 MG tablet Take 1 tablet (10 mg total) by mouth daily.   famotidine (PEPCID) 20 MG tablet Take 20 mg by mouth daily as needed for heartburn or indigestion.   finasteride (PROSCAR) 5 MG tablet TAKE 1 TABLET DAILY FOR URINATING DRIBBLING/PROSTATE ENLARGEMENT   levothyroxine (SYNTHROID) 25 MCG tablet TAKE 1 TABLET DAILY BEFORE BREAKFAST   metoprolol succinate (TOPROL-XL) 50 MG 24 hr tablet TAKE 1 TABLET TWICE A DAY   WITH OR IMMEDIATELY FOLLOWING A MEAL TO CONTROL BLOOD PRESSURE AND HEART RHYTHM   Multiple Vitamins-Minerals (PRESERVISION AREDS) CAPS Take 2 capsules by mouth daily.   simvastatin (ZOCOR) 10 MG tablet TAKE 1 TABLET DAILY TO LOWER CHOLESTEROL   terazosin (HYTRIN) 2 MG capsule TAKE 1 CAPSULE DAILY   valsartan (DIOVAN) 160 MG tablet TAKE 1 TABLET DAILY   No facility-administered encounter medications on file as of 06/28/2022.    Review of Systems  Constitutional:  Negative for activity change, appetite change and fever.  HENT:  Negative for sore throat.   Eyes: Negative.   Cardiovascular:  Negative for chest pain and leg swelling.  Gastrointestinal:  Negative for abdominal distention, diarrhea and vomiting.  Genitourinary:  Negative for dysuria,  frequency and urgency.  Skin:  Negative for color change.  Neurological:  Negative for dizziness and headaches.  Psychiatric/Behavioral:  Negative for behavioral problems and sleep disturbance. The patient is not nervous/anxious.       Immunization History  Administered Date(s) Administered   Influenza Whole 09/15/2012, 09/16/2013   Influenza, High Dose Seasonal PF 09/24/2017, 09/20/2021   Influenza,inj,Quad PF,6+ Mos 09/17/2018   Influenza-Unspecified 09/29/2014, 09/14/2015, 09/26/2016, 09/05/2020   Moderna Covid-19 Vaccine Bivalent Booster 55yr & up 09/05/2021   Moderna SARS-COV2 Booster Vaccination 12/31/2019, 05/23/2021   Moderna Sars-Covid-2 Vaccination 12/20/2019, 01/17/2020   PPD Test 11/27/2010   Pneumococcal Conjugate-13 02/08/2016   Pneumococcal Polysaccharide-23 01/17/1996   Td 02/14/2006, 09/04/2016   Zoster Recombinat (Shingrix) 03/31/2018, 06/23/2018   Pertinent  Health Maintenance Due  Topic Date Due   INFLUENZA VACCINE  07/16/2022   COLONOSCOPY (Pts 45-43yrInsurance coverage will need to be confirmed)  Discontinued      04/24/2022    1:45 PM 05/28/2022    2:48 PM 06/05/2022    9:31 AM 06/20/2022    3:15 PM 06/22/2022    3:41 PM  Fall  Risk  Falls in the past year? 0 0 1    Was there an injury with Fall? 0 0 0    Fall Risk Category Calculator 0 0 1    Fall Risk Category Low Low Low    Patient Fall Risk Level Low fall risk  Low fall risk Moderate fall risk Moderate fall risk  Patient at Risk for Falls Due to No Fall Risks No Fall Risks History of fall(s)    Fall risk Follow up Falls evaluation completed Falls evaluation completed Falls evaluation completed       Vitals:   06/28/22 1223  BP: (!) 145/90  Pulse: 68  Resp: 16  Temp: 98.2 F (36.8 C)  SpO2: 96%  Weight: 179 lb 3.2 oz (81.3 kg)  Height: '5\' 7"'$  (1.702 m)   Body mass index is 28.07 kg/m.  Physical Exam Constitutional:      General: He is not in acute distress.    Appearance: Normal  appearance.  HENT:     Head: Normocephalic and atraumatic.     Mouth/Throat:     Mouth: Mucous membranes are moist.  Eyes:     Conjunctiva/sclera: Conjunctivae normal.  Cardiovascular:     Rate and Rhythm: Normal rate. Rhythm irregular.     Pulses: Normal pulses.     Heart sounds: Normal heart sounds.  Pulmonary:     Effort: Pulmonary effort is normal.     Breath sounds: Normal breath sounds.  Abdominal:     General: Bowel sounds are normal.     Palpations: Abdomen is soft.  Musculoskeletal:        General: No swelling. Normal range of motion.     Cervical back: Normal range of motion.  Skin:    General: Skin is warm and dry.  Neurological:     General: No focal deficit present.     Mental Status: He is alert and oriented to person, place, and time.  Psychiatric:        Mood and Affect: Mood normal.        Behavior: Behavior normal.      Labs reviewed: Recent Labs    04/18/22 0850 06/20/22 1529 06/22/22 1557  NA 140 138 139  K 3.8 4.6 4.3  CL 105 104 103  CO2 '28 24 27  '$ GLUCOSE 115* 109* 107*  BUN '17 14 17  '$ CREATININE 1.08 1.05 1.18  CALCIUM 9.6 9.2 9.9   Recent Labs    10/17/21 0843 04/18/22 0850 06/20/22 1529  AST '16 18 19  '$ ALT '10 12 12  '$ ALKPHOS  --   --  79  BILITOT 0.8 0.5 1.4*  PROT 6.4 6.7 6.8  ALBUMIN  --   --  3.5   Recent Labs    10/17/21 0843 04/18/22 0850 06/20/22 1529 06/22/22 1557  WBC 7.1 7.7 13.3* 8.3  NEUTROABS 4,196 5,529 10.9*  --   HGB 15.0 15.2 15.2 14.6  HCT 43.3 44.1 45.3 43.1  MCV 96.2 95.2 96.8 96.9  PLT 185 187 175 201   Lab Results  Component Value Date   TSH 4.03 04/18/2022   No results found for: "HGBA1C" Lab Results  Component Value Date   CHOL 130 04/18/2022   HDL 44 04/18/2022   LDLCALC 65 04/18/2022   TRIG 120 04/18/2022   CHOLHDL 3.0 04/18/2022    Significant Diagnostic Results in last 30 days:  CT HEAD WO CONTRAST (5MM)  Result Date: 06/20/2022 CLINICAL DATA:  Mental status change, unknown cause  EXAM: CT HEAD WITHOUT CONTRAST TECHNIQUE: Contiguous axial images were obtained from the base of the skull through the vertex without intravenous contrast. RADIATION DOSE REDUCTION: This exam was performed according to the departmental dose-optimization program which includes automated exposure control, adjustment of the mA and/or kV according to patient size and/or use of iterative reconstruction technique. COMPARISON:  None Available. FINDINGS: Brain: No evidence of acute infarction, hemorrhage, hydrocephalus, extra-axial collection or mass lesion/mass effect. Patchy white matter hypodensities, nonspecific with paddle with chronic microvascular disease. Cerebral atrophy. Vascular: No hyperdense vessel identified. Skull: No acute fracture. Sinuses/Orbits: Clear sinuses.  No acute orbital findings. Other: No mastoid effusions. IMPRESSION: 1. No evidence of acute intracranial abnormality. 2. Chronic microvascular ischemic disease and cerebral atrophy (ICD10-G31.9). Electronically Signed   By: Margaretha Sheffield M.D.   On: 06/20/2022 16:27   DG Chest 2 View  Result Date: 06/20/2022 CLINICAL DATA:  Altered EXAM: CHEST - 2 VIEW COMPARISON:  Chest x-ray dated March 12, 2018 FINDINGS: Cardiac and mediastinal contours are within normal limits. Mild left basilar opacity. No large pleural effusion or evidence of pneumothorax. IMPRESSION: Mild left basilar opacity, likely due to atelectasis, infection or aspiration are additional considerations. Electronically Signed   By: Yetta Glassman M.D.   On: 06/20/2022 16:15   DG Shoulder Left  Result Date: 06/05/2022 CLINICAL DATA:  Fall and pain. EXAM: LEFT SHOULDER - 2+ VIEW COMPARISON:  None Available. FINDINGS: Mild glenohumeral degenerative changes. No fractures or dislocations. Limited views of the left chest are normal. IMPRESSION: Mild glenohumeral degenerative changes. No other acute abnormalities. Electronically Signed   By: Dorise Bullion III M.D.   On: 06/05/2022  17:45   DG Thoracic Spine 2 View  Result Date: 06/05/2022 CLINICAL DATA:  Fall with pain EXAM: THORACIC SPINE 2 VIEWS COMPARISON:  None Available. FINDINGS: Mild thoracic kyphosis without evidence for acute fracture. Multilevel degenerative osteophytes. IMPRESSION: Multilevel degenerative changes.  No acute osseous abnormality. Electronically Signed   By: Donavan Foil M.D.   On: 06/05/2022 17:24    Assessment/Plan  1. Dysphagia, unspecified type -  will refer to Speech Therapy for evaluation and treatment as needed -  aspiration precautions  2. Hypothyroidism, unspecified type Lab Results  Component Value Date   TSH 4.03 04/18/2022   -   Continue Synthroid  3. Essential hypertension -Blood pressure well controlled Continue current medication  4. Mixed hyperlipidemia Lab Results  Component Value Date   CHOL 130 04/18/2022   HDL 44 04/18/2022   LDLCALC 65 04/18/2022   TRIG 120 04/18/2022   CHOLHDL 3.0 04/18/2022   -  continue Simvastatin  5. Longstanding persistent atrial fibrillation (HCC) -  rate-controlled, continue Eliquis and Metoprolol succinate  6. Major depressive disorder with single episode, remission status unspecified -  mood is stable, continue Lexapro    Family/ staff Communication: Discussed plan of care with resident and charge nurse.  Labs/tests ordered:   None    Durenda Age, DNP, MSN, FNP-BC [facility] [phone] (Monday-Friday 8:00 a.m. - 5:00 p.m.) [phone] (after hours)

## 2022-07-02 DIAGNOSIS — R2689 Other abnormalities of gait and mobility: Secondary | ICD-10-CM | POA: Diagnosis not present

## 2022-07-02 DIAGNOSIS — M6281 Muscle weakness (generalized): Secondary | ICD-10-CM | POA: Diagnosis not present

## 2022-07-02 DIAGNOSIS — R41841 Cognitive communication deficit: Secondary | ICD-10-CM | POA: Diagnosis not present

## 2022-07-02 DIAGNOSIS — F32A Depression, unspecified: Secondary | ICD-10-CM | POA: Diagnosis not present

## 2022-07-02 DIAGNOSIS — K219 Gastro-esophageal reflux disease without esophagitis: Secondary | ICD-10-CM | POA: Diagnosis not present

## 2022-07-02 DIAGNOSIS — R1312 Dysphagia, oropharyngeal phase: Secondary | ICD-10-CM | POA: Diagnosis not present

## 2022-07-02 DIAGNOSIS — R531 Weakness: Secondary | ICD-10-CM | POA: Diagnosis not present

## 2022-07-02 DIAGNOSIS — I4891 Unspecified atrial fibrillation: Secondary | ICD-10-CM | POA: Diagnosis not present

## 2022-07-03 DIAGNOSIS — F32A Depression, unspecified: Secondary | ICD-10-CM | POA: Diagnosis not present

## 2022-07-03 DIAGNOSIS — K219 Gastro-esophageal reflux disease without esophagitis: Secondary | ICD-10-CM | POA: Diagnosis not present

## 2022-07-03 DIAGNOSIS — R41841 Cognitive communication deficit: Secondary | ICD-10-CM | POA: Diagnosis not present

## 2022-07-03 DIAGNOSIS — R1312 Dysphagia, oropharyngeal phase: Secondary | ICD-10-CM | POA: Diagnosis not present

## 2022-07-03 DIAGNOSIS — R2689 Other abnormalities of gait and mobility: Secondary | ICD-10-CM | POA: Diagnosis not present

## 2022-07-03 DIAGNOSIS — M6281 Muscle weakness (generalized): Secondary | ICD-10-CM | POA: Diagnosis not present

## 2022-07-04 ENCOUNTER — Ambulatory Visit (INDEPENDENT_AMBULATORY_CARE_PROVIDER_SITE_OTHER): Payer: Medicare Other | Admitting: Physician Assistant

## 2022-07-04 ENCOUNTER — Other Ambulatory Visit: Payer: Medicare Other

## 2022-07-04 ENCOUNTER — Encounter: Payer: Self-pay | Admitting: Physician Assistant

## 2022-07-04 VITALS — BP 90/51 | HR 66 | Resp 18 | Wt 179.0 lb

## 2022-07-04 DIAGNOSIS — R2689 Other abnormalities of gait and mobility: Secondary | ICD-10-CM | POA: Diagnosis not present

## 2022-07-04 DIAGNOSIS — M6281 Muscle weakness (generalized): Secondary | ICD-10-CM | POA: Diagnosis not present

## 2022-07-04 DIAGNOSIS — R413 Other amnesia: Secondary | ICD-10-CM | POA: Diagnosis not present

## 2022-07-04 DIAGNOSIS — G3184 Mild cognitive impairment, so stated: Secondary | ICD-10-CM | POA: Diagnosis not present

## 2022-07-04 DIAGNOSIS — R1312 Dysphagia, oropharyngeal phase: Secondary | ICD-10-CM | POA: Diagnosis not present

## 2022-07-04 DIAGNOSIS — R41841 Cognitive communication deficit: Secondary | ICD-10-CM | POA: Diagnosis not present

## 2022-07-04 DIAGNOSIS — F32A Depression, unspecified: Secondary | ICD-10-CM | POA: Diagnosis not present

## 2022-07-04 DIAGNOSIS — K219 Gastro-esophageal reflux disease without esophagitis: Secondary | ICD-10-CM | POA: Diagnosis not present

## 2022-07-04 NOTE — Progress Notes (Signed)
Assessment/Plan:    The patient is seen in neurologic consultation at the request of Varney Biles, MD for the evaluation of memory.  Phillip [name] is a very pleasant 88 y.o. year old [facility] male ALF resident, with  a history of hypertension, hyperlipidemia, CKD stage III, BPH, A-fib on Eliquis, and recent hospitalization for UTI, in which she experienced visual hallucinations and auditory hallucinations as well.  He was aware that they were not real.  He also had distortion of objects, some of the [place] and other small.  Cultures were drawn, results are not available for review.  He received Keflex, and hydration, with significant improvement.  While initially symptom-free around 7/12, on 7/16, he experienced a "urine event ", though unclear if he had any seizure activity.  Latest CT of the head shows significant atrophy, especially on the left temporal area, mild chronic microvascular changes, but no acute findings.  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  Discussed with daughters who agree with that at this time, antidementia medications' risk would outweigh the benefit given that his symptoms are not completely resolved.  Mild cognitive impairment  Check B12 Increase fluid intake Check EEG to rule out seizures given recent altered level of consciousness, and 1 episode of bedwetting without any other seizure symptoms. Mood control as per PCP.  Continue Lexapro 10 mg daily Continue PT and speech therapy Follow-up in 6 months  Folllow up once results above are available   Subjective:    The patient is accompanied by  who supplements the history.    How long did patient have memory difficulties?  Initially the patient denied.  Then on questioning, he stated that "perhaps this has been going on for a year ".  On further questioning, his daughters report that  indeed, may be for a couple of years, he has been in cognitive decline, especially with remembering words and short-term memory especially over the last 2 months.  Long-term memory is good.  Patient admits that has been hard for him talking about Alzheimer's, sees wife is in a memory care facility with Alzheimer's disease; his daughters suspect that he may have been minimizing the symptoms because of this.. Patient lives in [place].  Until hospitalization for AMS, the patient was leaning in independent living facility, but after the hospitalization he was moved to ALF. Repeats oneself? Denies  Disoriented when walking into a room?  Patient denies   Leaving objects in unusual places?  Patient denies   Ambulates  with difficulty?   Patient denies   Recent falls?  He had a recent fall after missing the toilet seat, and hitting his back.  Any head injuries?  Patient denies   History of seizures?  He is not aware.  He has a daughter with a history of seizures.  However, he had an episode of bedwetting on 06/30/2022, which is of concern.  He also had episodes of "hearing music ".  He denies any other symptoms such as bloating his lips or tongue, muscle aches after waking up, body jerks etc. Wandering behavior?  Patient denies   Patient drives?   Patient no longer drives due to macular degeneration  Any mood changes such irritability agitation?  Patient denies   Any history of depression?:  His wife lives in a memory care facility for the last year.  He has  been more depressed over the last 2 months given his recent health issues and his wife being in a different place.  He was started on Lexapro 2 weeks ago by PCP.  Hallucinations?  He is daughters report that he was in his usual state of health, until he experienced UTI on 06/14/2022.  At that point, he was prescribed cephalexin, and a week later, he began to have hallucinations.  Of note, the patient drinks minimal amount of water, and was apparently dehydrated.   On 06/23/2022, he still experience hallucinations, and his eyes were closed.  Symptoms began to resolve as of 06/24/2022.  Throughout this time, he continued to experience visual distortion, such as shape shifting, and hearing music or hearing and air conditioner when it was not there.  As of 06/26/2022, he began to be symptom-free, however on 06/30/2022, he had an overnight urine event as described by his daughters.  No hallucination over the last couple of days.   Patient reports that he sleeps well without vivid dreams, REM behavior or sleepwalking    History of sleep apnea?  Patient denies   Any hygiene concerns?  Patient denies   Independent of bathing and dressing?  Endorsed  Does the patient needs help with medications?  ALF monitors Who is in charge of the finances?  Daughter is that in charge. Any changes in appetite?  Patient denies   Patient have trouble swallowing? Patient denies   Does the patient cook?  Patient denies   Any kitchen accidents such as leaving the stove on? Patient denies   Any headaches?  Patient denies   Double vision? Patient denies   Any focal numbness or tingling?  Patient denies   Chronic back pain Patient denies   Unilateral weakness?  Patient denies   Any tremors?  Patient denies   Any history of anosmia?  Patient denies   Any incontinence of urine?  Endorsed, he does not wear diapers.  He had 1 "accident", on 06/30/2022.  Recent UTI.  He also has a history of BPH with prior urine retention. Any bowel dysfunction?  Sometimes he experiences diarrhea History of heavy alcohol intake?  Patient denies   History of heavy tobacco use?  Patient denies   Family history of dementia?  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[employer])  Allergies  Allergen Reactions   Ciprofloxacin Itching    Patient doesn't like the way it makes him feel   Lisinopril Itching    unknown   Sulfa Antibiotics Rash    Current Outpatient Medications  Medication  Instructions   acetaminophen (TYLENOL) 500 mg, Oral, Daily PRN   ELIQUIS 5 MG TABS tablet TAKE 1 TABLET TWICE A DAY FOR ANTICOAGULATION   escitalopram (LEXAPRO) 10 mg, Oral, Daily   famotidine (PEPCID) 20 mg, Oral, Daily PRN   finasteride (PROSCAR) 5 MG tablet TAKE 1 TABLET DAILY FOR URINATING DRIBBLING/PROSTATE ENLARGEMENT   levothyroxine (SYNTHROID) 25 MCG tablet TAKE 1 TABLET DAILY BEFORE BREAKFAST   metoprolol succinate (TOPROL-XL) 50 MG 24 hr tablet TAKE 1 TABLET TWICE A DAY   WITH OR IMMEDIATELY FOLLOWING A MEAL TO CONTROL BLOOD PRESSURE AND HEART RHYTHM   Multiple Vitamins-Minerals (PRESERVISION AREDS) CAPS 2 capsules, Oral, Daily   simvastatin (ZOCOR) 10 MG tablet TAKE 1 TABLET DAILY TO LOWER CHOLESTEROL   terazosin (HYTRIN) 2 MG capsule TAKE 1 CAPSULE DAILY   valsartan (DIOVAN) 160 MG tablet TAKE 1 TABLET DAILY     VITALS:  There were no  vitals filed for this visit.    05/28/2022    2:48 PM 03/25/2022    3:22 PM 03/18/2018    2:47 PM 10/01/2017    9:54 AM 07/23/2016    9:36 AM  Depression screen PHQ 2/9  Decreased Interest 0 0 0 0 0  Down, Depressed, Hopeless 0 0 0 0 0  PHQ - 2 Score 0 0 0 0 0    PHYSICAL EXAM   HEENT:  Normocephalic, atraumatic. The mucous membranes are moist. The superficial temporal arteries are without ropiness or tenderness. Cardiovascular: Regular rate and rhythm. Lungs: Clear to auscultation bilaterally. Neck: There are no carotid bruits noted bilaterally.  NEUROLOGICAL:     No data to display             03/25/2022    3:23 PM 01/28/2018   11:36 AM  MMSE - Mini Mental State Exam  Orientation to time 5 4  Orientation to Place 5 5  Registration 3 3  Attention/ Calculation 3 5  Recall 0 3  Language- name 2 objects 2 2  Language- repeat 1 1  Language- follow 3 step command 3 3  Language- read & follow direction 1 1  Write a sentence 1 1  Copy design 0 1  Total score 24 29     Orientation:  Alert and oriented to person, place and time.  No aphasia or dysarthria. Fund of knowledge is appropriate. Recent memory impaired and remote memory intact.  Attention and concentration are normal.  Able to name objects and repeat phrases. Delayed recall   Cranial nerves: There is good facial symmetry. Extraocular muscles are intact and visual fields are full to confrontational testing. Speech is fluent and clear. Soft palate rises symmetrically and there is no tongue deviation. Hearing is intact to conversational tone. Tone: Tone is good throughout. Sensation: Sensation is intact to light touch and pinprick throughout. Vibration is intact at the bilateral big toe.There is no extinction with double simultaneous stimulation. There is no sensory dermatomal level identified. Coordination: The patient has no difficulty with RAM's or FNF bilaterally. Normal finger to nose  Motor: Strength is 5/5 in the bilateral upper and lower extremities. There is no pronator drift. There are no fasciculations noted. DTR's: Deep tendon reflexes are 2/4 at the bilateral biceps, triceps, brachioradialis, patella and achilles.  Plantar responses are downgoing bilaterally. Gait and Station: The patient is able to ambulate without difficulty.The patient is able to heel toe walk without any difficulty.The patient is able to ambulate in a tandem fashion. The patient is able to stand in the Romberg position.     Thank you for allowing [place] the opportunity to participate in the care of this nice patient. Please do not hesitate to contact us for any questions or concerns.   Total time spent on today's visit was 70 minutes dedicated to this patient today, preparing to see patient, examining the patient, ordering tests and/or medications and counseling the patient, documenting clinical information in the EHR or other health record, independently interpreting results and communicating results to the patient/family, discussing treatment and goals, answering patient's questions and  coordinating care.  Cc:  Virgie Dad, MD  Sharene Butters 07/04/2022 12:46 PM

## 2022-07-04 NOTE — Patient Instructions (Addendum)
It was a pleasure to see you today at our office.   Recommendations:  Follow up in   Jan 25 at 11:30  Make sure to have PCP control the mood issues, continue Lexapro 10 mg daily   Make sure to drink water  Recommend increasing activity with PT, ST Check B12 EEG    Whom to call:  Memory  decline, memory medications: Call our office [phone]   For psychiatric meds, mood meds: Please have your primary care physician manage these medications.   Counseling regarding caregiver distress, including caregiver depression, anxiety and issues regarding community resources, adult day care programs, adult living facilities, or memory care questions:   Feel free to contact [place], Social Worker at [phone]   For assessment of decision of mental capacity and competency:  Call Dr. Anthoney Harada, geriatric psychiatrist at [phone]  For guidance in geriatric dementia issues please call [facility] [phone]  For guidance regarding [facility] and if placement were needed at the facility, contact Arnell Asal, Social Worker tel: [phone]  If you have any severe symptoms of a stroke, or other severe issues such as confusion,severe chills or fever, etc call 911 or go to the ER as you may need to be evaluated further   Feel free to visit Facebook page " Inspo" for tips of how to care for people with memory problems.   Feel free to go to the following database for funded clinical studies conducted around the world: http://saunders.com/   https://www.triadclinicaltrials.com/     RECOMMENDATIONS FOR ALL PATIENTS WITH MEMORY PROBLEMS: 1. Continue to exercise (Recommend 30 minutes of walking everyday, or 3 hours every week) 2. Increase social interactions - continue going to [place] and enjoy social gatherings with friends and family 3. Eat healthy, avoid fried foods and eat more fruits and vegetables 4. Maintain adequate blood  pressure, blood sugar, and blood cholesterol level. Reducing the risk of stroke and cardiovascular disease also helps promoting better memory. 5. Avoid stressful situations. Live a simple life and avoid aggravations. Organize your time and prepare for the next day in anticipation. 6. Sleep well, avoid any interruptions of sleep and avoid any distractions in the bedroom that may interfere with adequate sleep quality 7. Avoid sugar, avoid sweets as there is a strong link between excessive sugar intake, diabetes, and cognitive impairment We discussed the Mediterranean diet, which has been shown to help patients reduce the risk of progressive memory disorders and reduces cardiovascular risk. This includes eating fish, eat fruits and green leafy vegetables, nuts like almonds and hazelnuts, walnuts, and also use olive oil. Avoid fast foods and fried foods as much as possible. Avoid sweets and sugar as sugar use has been linked to worsening of memory function.  There is always a concern of gradual progression of memory problems. If this is the case, then we may need to adjust level of care according to patient needs. Support, both to the patient and caregiver, should then be put into place.    The Alzheimer's Association is here all day, every day for people facing Alzheimer's disease through our free 24/7 Helpline: [phone]. The Helpline provides reliable information and support to all those who need assistance, such as individuals living with memory loss, Alzheimer's or other dementia, caregivers, health care professionals and the public.  Our highly trained and knowledgeable staff can help you with: Understanding memory loss, dementia and Alzheimer's  Medications and other treatment options  General information about aging  and brain health  Skills to provide quality care and to find the best care from professionals  Legal, financial and living-arrangement decisions Our Helpline also  features: Confidential care consultation provided by master's level clinicians who can help with decision-making support, crisis assistance and education on issues families face every day  Help in a caller's preferred language using our translation service that features more than 200 languages and dialects  Referrals to local community programs, services and ongoing support     FALL PRECAUTIONS: Be cautious when walking. Scan the area for obstacles that may increase the risk of trips and falls. When getting up in the mornings, sit up at the edge of the bed for a few minutes before getting out of bed. Consider elevating the bed at the head end to avoid drop of blood pressure when getting up. Walk always in a well-lit room (use night lights in the walls). Avoid area rugs or power cords from appliances in the middle of the walkways. Use a walker or a cane if necessary and consider physical therapy for balance exercise. Get your eyesight checked regularly.  FINANCIAL OVERSIGHT: Supervision, especially oversight when making financial decisions or transactions is also recommended.  HOME SAFETY: Consider the safety of the kitchen when operating appliances like stoves, microwave oven, and blender. Consider having supervision and share cooking responsibilities until no longer able to participate in those. Accidents with firearms and other hazards in the house should be identified and addressed as well.   ABILITY TO BE LEFT ALONE: If patient is unable to contact 911 operator, consider using LifeLine, or when the need is there, arrange for someone to stay with patients. Smoking is a fire hazard, consider supervision or cessation. Risk of wandering should be assessed by caregiver and if detected at any point, supervision and safe proof recommendations should be instituted.  MEDICATION SUPERVISION: Inability to self-administer medication needs to be constantly addressed. Implement a mechanism to ensure safe  administration of the medications.   DRIVING: Regarding driving, in patients with progressive memory problems, driving will be impaired. We advise to have someone else do the driving if trouble finding directions or if minor accidents are reported. Independent driving assessment is available to determine safety of driving.   If you are interested in the driving assessment, you can contact the following:  [employer] in [place]  [place] [phone] or [phone]      [place] refers to food and lifestyle choices that are based on the traditions of countries located on the [employer]. This way of eating has been shown to help prevent certain conditions and improve outcomes for people who have chronic diseases, like kidney disease and heart disease. What are tips for following this plan? Lifestyle  Cook and eat meals together with your family, when possible. Drink enough fluid to keep your urine clear or pale yellow. Be physically active every day. This includes: Aerobic exercise like running or swimming. Leisure activities like gardening, walking, or housework. Get 7-8 hours of sleep each night. If recommended by your health care provider, drink red wine in moderation. This means 1 glass a day for nonpregnant women and 2 glasses a day for men. A glass of wine equals 5 oz (150 mL). Reading food labels  Check the serving size of packaged foods. For foods such as rice and pasta, the serving size refers to the amount of cooked  product, not dry. Check the total fat in packaged foods. Avoid foods that have saturated fat or trans fats. Check the ingredients list for added sugars, such as corn syrup. Shopping  At the grocery store, buy most of your food from the areas near the walls of the store. This includes: Fresh fruits and  vegetables (produce). Grains, beans, nuts, and seeds. Some of these may be available in unpackaged forms or large amounts (in bulk). Fresh seafood. Poultry and eggs. Low-fat dairy products. Buy whole ingredients instead of prepackaged foods. Buy fresh fruits and vegetables in-season from local farmers markets. Buy frozen fruits and vegetables in resealable bags. If you do not have access to quality fresh seafood, buy precooked frozen shrimp or canned fish, such as tuna, salmon, or sardines. Buy small amounts of raw or cooked vegetables, salads, or olives from the deli or salad bar at your store. Stock your pantry so you always have certain foods on hand, such as olive oil, canned tuna, canned tomatoes, rice, pasta, and beans. Cooking  Cook foods with extra-virgin olive oil instead of using butter or other vegetable oils. Have meat as a side dish, and have vegetables or grains as your main dish. This means having meat in small portions or adding small amounts of meat to foods like pasta or stew. Use beans or vegetables instead of meat in common dishes like chili or lasagna. Experiment with different cooking methods. Try roasting or broiling vegetables instead of steaming or sauteing them. Add frozen vegetables to soups, stews, pasta, or rice. Add nuts or seeds for added healthy fat at each meal. You can add these to yogurt, salads, or vegetable dishes. Marinate fish or vegetables using olive oil, lemon juice, garlic, and fresh herbs. Meal planning  Plan to eat 1 vegetarian meal one day each week. Try to work up to 2 vegetarian meals, if possible. Eat seafood 2 or more times a week. Have healthy snacks readily available, such as: Vegetable sticks with hummus. Greek yogurt. Fruit and nut trail mix. Eat balanced meals throughout the week. This includes: Fruit: 2-3 servings a day Vegetables: 4-5 servings a day Low-fat dairy: 2 servings a day Fish, poultry, or lean meat: 1 serving a  day Beans and legumes: 2 or more servings a week Nuts and seeds: 1-2 servings a day Whole grains: 6-8 servings a day Extra-virgin olive oil: 3-4 servings a day Limit red meat and sweets to only a few servings a month What are my food choices? Mediterranean diet Recommended Grains: Whole-grain pasta. Brown rice. Bulgar wheat. Polenta. Couscous. Whole-wheat bread. [place]. Vegetables: Artichokes. Beets. Broccoli. Cabbage. Carrots. Eggplant. Green beans. Chard. Kale. Spinach. Onions. Leeks. Peas. Squash. Tomatoes. Peppers. Radishes. Fruits: Apples. Apricots. Avocado. Berries. Bananas. Cherries. Dates. Figs. Grapes. Lemons. Melon. Oranges. Peaches. Plums. Pomegranate. Meats and other protein foods: Beans. Almonds. Sunflower seeds. Pine nuts. Peanuts. [place]. Salmon. Scallops. Shrimp. [place]. Tilapia. Clams. Oysters. Eggs. Dairy: Low-fat milk. Cheese. Greek yogurt. Beverages: Water. Red wine. Herbal tea. Fats and oils: Extra virgin olive oil. Avocado oil. Grape seed oil. Sweets and desserts: [place] yogurt with honey. Baked apples. Poached pears. Trail mix. Seasoning and other foods: Basil. Cilantro. Coriander. Cumin. Mint. Parsley. Sage. Rosemary. Tarragon. Garlic. Oregano. Thyme. Pepper. Balsalmic vinegar. Tahini. Hummus. Tomato sauce. Olives. Mushrooms. Limit these Grains: Prepackaged pasta or rice dishes. Prepackaged cereal with added sugar. Vegetables: Deep fried potatoes (french fries). Fruits: Fruit canned in syrup. Meats and other protein foods: Beef. Pork. Lamb. Poultry with skin. Hot dogs. Berniece Salines.  Dairy: Ice cream. Sour cream. Whole milk. Beverages: Juice. Sugar-sweetened soft drinks. Beer. Liquor and spirits. Fats and oils: Butter. Canola oil. Vegetable oil. Beef fat (tallow). Lard. Sweets and desserts: Cookies. Cakes. Pies. Candy. Seasoning and other foods: Mayonnaise. Premade sauces and marinades. The items listed may not be a complete list. Talk with your dietitian about what  dietary choices are right for you. Summary The Mediterranean diet includes both food and lifestyle choices. Eat a variety of fresh fruits and vegetables, beans, nuts, seeds, and whole grains. Limit the amount of red meat and sweets that you eat. Talk with your health care provider about whether it is safe for you to drink red wine in moderation. This means 1 glass a day for nonpregnant women and 2 glasses a day for men. A glass of wine equals 5 oz (150 mL). This information is not intended to replace advice given to you by your health care provider. Make sure you discuss any questions you have with your health care provider. Document Released: 07/25/2016 Document Revised: 08/27/2016 Document Reviewed: 07/25/2016 Elsevier Interactive Patient Education  2017 [employer].

## 2022-07-05 DIAGNOSIS — R41841 Cognitive communication deficit: Secondary | ICD-10-CM | POA: Diagnosis not present

## 2022-07-05 DIAGNOSIS — M6281 Muscle weakness (generalized): Secondary | ICD-10-CM | POA: Diagnosis not present

## 2022-07-05 DIAGNOSIS — R1312 Dysphagia, oropharyngeal phase: Secondary | ICD-10-CM | POA: Diagnosis not present

## 2022-07-05 DIAGNOSIS — R2689 Other abnormalities of gait and mobility: Secondary | ICD-10-CM | POA: Diagnosis not present

## 2022-07-05 DIAGNOSIS — K219 Gastro-esophageal reflux disease without esophagitis: Secondary | ICD-10-CM | POA: Diagnosis not present

## 2022-07-05 DIAGNOSIS — F32A Depression, unspecified: Secondary | ICD-10-CM | POA: Diagnosis not present

## 2022-07-05 LAB — VITAMIN B12: Vitamin B-12: 465 pg/mL (ref 211–911)

## 2022-07-05 NOTE — Progress Notes (Signed)
Please inform patient that B12 is normal, thanks

## 2022-07-07 DIAGNOSIS — M6281 Muscle weakness (generalized): Secondary | ICD-10-CM | POA: Diagnosis not present

## 2022-07-07 DIAGNOSIS — R2689 Other abnormalities of gait and mobility: Secondary | ICD-10-CM | POA: Diagnosis not present

## 2022-07-07 DIAGNOSIS — K219 Gastro-esophageal reflux disease without esophagitis: Secondary | ICD-10-CM | POA: Diagnosis not present

## 2022-07-07 DIAGNOSIS — R41841 Cognitive communication deficit: Secondary | ICD-10-CM | POA: Diagnosis not present

## 2022-07-07 DIAGNOSIS — F32A Depression, unspecified: Secondary | ICD-10-CM | POA: Diagnosis not present

## 2022-07-07 DIAGNOSIS — R1312 Dysphagia, oropharyngeal phase: Secondary | ICD-10-CM | POA: Diagnosis not present

## 2022-07-09 DIAGNOSIS — F32A Depression, unspecified: Secondary | ICD-10-CM | POA: Diagnosis not present

## 2022-07-09 DIAGNOSIS — M6281 Muscle weakness (generalized): Secondary | ICD-10-CM | POA: Diagnosis not present

## 2022-07-09 DIAGNOSIS — R2689 Other abnormalities of gait and mobility: Secondary | ICD-10-CM | POA: Diagnosis not present

## 2022-07-09 DIAGNOSIS — K219 Gastro-esophageal reflux disease without esophagitis: Secondary | ICD-10-CM | POA: Diagnosis not present

## 2022-07-09 DIAGNOSIS — R1312 Dysphagia, oropharyngeal phase: Secondary | ICD-10-CM | POA: Diagnosis not present

## 2022-07-09 DIAGNOSIS — R41841 Cognitive communication deficit: Secondary | ICD-10-CM | POA: Diagnosis not present

## 2022-07-10 DIAGNOSIS — F32A Depression, unspecified: Secondary | ICD-10-CM | POA: Diagnosis not present

## 2022-07-10 DIAGNOSIS — K219 Gastro-esophageal reflux disease without esophagitis: Secondary | ICD-10-CM | POA: Diagnosis not present

## 2022-07-10 DIAGNOSIS — R41841 Cognitive communication deficit: Secondary | ICD-10-CM | POA: Diagnosis not present

## 2022-07-10 DIAGNOSIS — R1312 Dysphagia, oropharyngeal phase: Secondary | ICD-10-CM | POA: Diagnosis not present

## 2022-07-10 DIAGNOSIS — M6281 Muscle weakness (generalized): Secondary | ICD-10-CM | POA: Diagnosis not present

## 2022-07-10 DIAGNOSIS — R2689 Other abnormalities of gait and mobility: Secondary | ICD-10-CM | POA: Diagnosis not present

## 2022-07-11 DIAGNOSIS — K219 Gastro-esophageal reflux disease without esophagitis: Secondary | ICD-10-CM | POA: Diagnosis not present

## 2022-07-11 DIAGNOSIS — F32A Depression, unspecified: Secondary | ICD-10-CM | POA: Diagnosis not present

## 2022-07-11 DIAGNOSIS — R41841 Cognitive communication deficit: Secondary | ICD-10-CM | POA: Diagnosis not present

## 2022-07-11 DIAGNOSIS — R2689 Other abnormalities of gait and mobility: Secondary | ICD-10-CM | POA: Diagnosis not present

## 2022-07-11 DIAGNOSIS — M6281 Muscle weakness (generalized): Secondary | ICD-10-CM | POA: Diagnosis not present

## 2022-07-11 DIAGNOSIS — R1312 Dysphagia, oropharyngeal phase: Secondary | ICD-10-CM | POA: Diagnosis not present

## 2022-07-12 DIAGNOSIS — R41841 Cognitive communication deficit: Secondary | ICD-10-CM | POA: Diagnosis not present

## 2022-07-12 DIAGNOSIS — M6281 Muscle weakness (generalized): Secondary | ICD-10-CM | POA: Diagnosis not present

## 2022-07-12 DIAGNOSIS — F32A Depression, unspecified: Secondary | ICD-10-CM | POA: Diagnosis not present

## 2022-07-12 DIAGNOSIS — R1312 Dysphagia, oropharyngeal phase: Secondary | ICD-10-CM | POA: Diagnosis not present

## 2022-07-12 DIAGNOSIS — R2689 Other abnormalities of gait and mobility: Secondary | ICD-10-CM | POA: Diagnosis not present

## 2022-07-12 DIAGNOSIS — K219 Gastro-esophageal reflux disease without esophagitis: Secondary | ICD-10-CM | POA: Diagnosis not present

## 2022-07-15 DIAGNOSIS — R1312 Dysphagia, oropharyngeal phase: Secondary | ICD-10-CM | POA: Diagnosis not present

## 2022-07-15 DIAGNOSIS — K219 Gastro-esophageal reflux disease without esophagitis: Secondary | ICD-10-CM | POA: Diagnosis not present

## 2022-07-15 DIAGNOSIS — M6281 Muscle weakness (generalized): Secondary | ICD-10-CM | POA: Diagnosis not present

## 2022-07-15 DIAGNOSIS — F32A Depression, unspecified: Secondary | ICD-10-CM | POA: Diagnosis not present

## 2022-07-15 DIAGNOSIS — R41841 Cognitive communication deficit: Secondary | ICD-10-CM | POA: Diagnosis not present

## 2022-07-15 DIAGNOSIS — R2689 Other abnormalities of gait and mobility: Secondary | ICD-10-CM | POA: Diagnosis not present

## 2022-07-16 DIAGNOSIS — R29898 Other symptoms and signs involving the musculoskeletal system: Secondary | ICD-10-CM | POA: Diagnosis not present

## 2022-07-16 DIAGNOSIS — M6281 Muscle weakness (generalized): Secondary | ICD-10-CM | POA: Diagnosis not present

## 2022-07-16 DIAGNOSIS — R1312 Dysphagia, oropharyngeal phase: Secondary | ICD-10-CM | POA: Diagnosis not present

## 2022-07-16 DIAGNOSIS — R41841 Cognitive communication deficit: Secondary | ICD-10-CM | POA: Diagnosis not present

## 2022-07-16 DIAGNOSIS — F32A Depression, unspecified: Secondary | ICD-10-CM | POA: Diagnosis not present

## 2022-07-16 DIAGNOSIS — R2689 Other abnormalities of gait and mobility: Secondary | ICD-10-CM | POA: Diagnosis not present

## 2022-07-16 DIAGNOSIS — K219 Gastro-esophageal reflux disease without esophagitis: Secondary | ICD-10-CM | POA: Diagnosis not present

## 2022-07-16 DIAGNOSIS — N3946 Mixed incontinence: Secondary | ICD-10-CM | POA: Diagnosis not present

## 2022-07-16 DIAGNOSIS — N390 Urinary tract infection, site not specified: Secondary | ICD-10-CM | POA: Diagnosis not present

## 2022-07-17 DIAGNOSIS — R1312 Dysphagia, oropharyngeal phase: Secondary | ICD-10-CM | POA: Diagnosis not present

## 2022-07-17 DIAGNOSIS — M6281 Muscle weakness (generalized): Secondary | ICD-10-CM | POA: Diagnosis not present

## 2022-07-17 DIAGNOSIS — R41841 Cognitive communication deficit: Secondary | ICD-10-CM | POA: Diagnosis not present

## 2022-07-17 DIAGNOSIS — F32A Depression, unspecified: Secondary | ICD-10-CM | POA: Diagnosis not present

## 2022-07-17 DIAGNOSIS — K219 Gastro-esophageal reflux disease without esophagitis: Secondary | ICD-10-CM | POA: Diagnosis not present

## 2022-07-17 DIAGNOSIS — R2689 Other abnormalities of gait and mobility: Secondary | ICD-10-CM | POA: Diagnosis not present

## 2022-07-18 DIAGNOSIS — K219 Gastro-esophageal reflux disease without esophagitis: Secondary | ICD-10-CM | POA: Diagnosis not present

## 2022-07-18 DIAGNOSIS — R2689 Other abnormalities of gait and mobility: Secondary | ICD-10-CM | POA: Diagnosis not present

## 2022-07-18 DIAGNOSIS — M6281 Muscle weakness (generalized): Secondary | ICD-10-CM | POA: Diagnosis not present

## 2022-07-18 DIAGNOSIS — R1312 Dysphagia, oropharyngeal phase: Secondary | ICD-10-CM | POA: Diagnosis not present

## 2022-07-18 DIAGNOSIS — R41841 Cognitive communication deficit: Secondary | ICD-10-CM | POA: Diagnosis not present

## 2022-07-18 DIAGNOSIS — F32A Depression, unspecified: Secondary | ICD-10-CM | POA: Diagnosis not present

## 2022-07-19 DIAGNOSIS — K219 Gastro-esophageal reflux disease without esophagitis: Secondary | ICD-10-CM | POA: Diagnosis not present

## 2022-07-19 DIAGNOSIS — M6281 Muscle weakness (generalized): Secondary | ICD-10-CM | POA: Diagnosis not present

## 2022-07-19 DIAGNOSIS — R1312 Dysphagia, oropharyngeal phase: Secondary | ICD-10-CM | POA: Diagnosis not present

## 2022-07-19 DIAGNOSIS — R41841 Cognitive communication deficit: Secondary | ICD-10-CM | POA: Diagnosis not present

## 2022-07-19 DIAGNOSIS — F32A Depression, unspecified: Secondary | ICD-10-CM | POA: Diagnosis not present

## 2022-07-19 DIAGNOSIS — R2689 Other abnormalities of gait and mobility: Secondary | ICD-10-CM | POA: Diagnosis not present

## 2022-07-22 DIAGNOSIS — M6281 Muscle weakness (generalized): Secondary | ICD-10-CM | POA: Diagnosis not present

## 2022-07-22 DIAGNOSIS — F32A Depression, unspecified: Secondary | ICD-10-CM | POA: Diagnosis not present

## 2022-07-22 DIAGNOSIS — R1312 Dysphagia, oropharyngeal phase: Secondary | ICD-10-CM | POA: Diagnosis not present

## 2022-07-22 DIAGNOSIS — K219 Gastro-esophageal reflux disease without esophagitis: Secondary | ICD-10-CM | POA: Diagnosis not present

## 2022-07-22 DIAGNOSIS — R41841 Cognitive communication deficit: Secondary | ICD-10-CM | POA: Diagnosis not present

## 2022-07-22 DIAGNOSIS — R2689 Other abnormalities of gait and mobility: Secondary | ICD-10-CM | POA: Diagnosis not present

## 2022-07-23 DIAGNOSIS — M6281 Muscle weakness (generalized): Secondary | ICD-10-CM | POA: Diagnosis not present

## 2022-07-23 DIAGNOSIS — R1312 Dysphagia, oropharyngeal phase: Secondary | ICD-10-CM | POA: Diagnosis not present

## 2022-07-23 DIAGNOSIS — R41841 Cognitive communication deficit: Secondary | ICD-10-CM | POA: Diagnosis not present

## 2022-07-23 DIAGNOSIS — F32A Depression, unspecified: Secondary | ICD-10-CM | POA: Diagnosis not present

## 2022-07-23 DIAGNOSIS — K219 Gastro-esophageal reflux disease without esophagitis: Secondary | ICD-10-CM | POA: Diagnosis not present

## 2022-07-23 DIAGNOSIS — R2689 Other abnormalities of gait and mobility: Secondary | ICD-10-CM | POA: Diagnosis not present

## 2022-07-24 DIAGNOSIS — F32A Depression, unspecified: Secondary | ICD-10-CM | POA: Diagnosis not present

## 2022-07-24 DIAGNOSIS — R2689 Other abnormalities of gait and mobility: Secondary | ICD-10-CM | POA: Diagnosis not present

## 2022-07-24 DIAGNOSIS — M6281 Muscle weakness (generalized): Secondary | ICD-10-CM | POA: Diagnosis not present

## 2022-07-24 DIAGNOSIS — R41841 Cognitive communication deficit: Secondary | ICD-10-CM | POA: Diagnosis not present

## 2022-07-24 DIAGNOSIS — R1312 Dysphagia, oropharyngeal phase: Secondary | ICD-10-CM | POA: Diagnosis not present

## 2022-07-24 DIAGNOSIS — K219 Gastro-esophageal reflux disease without esophagitis: Secondary | ICD-10-CM | POA: Diagnosis not present

## 2022-07-25 DIAGNOSIS — M6281 Muscle weakness (generalized): Secondary | ICD-10-CM | POA: Diagnosis not present

## 2022-07-25 DIAGNOSIS — F32A Depression, unspecified: Secondary | ICD-10-CM | POA: Diagnosis not present

## 2022-07-25 DIAGNOSIS — R41841 Cognitive communication deficit: Secondary | ICD-10-CM | POA: Diagnosis not present

## 2022-07-25 DIAGNOSIS — K219 Gastro-esophageal reflux disease without esophagitis: Secondary | ICD-10-CM | POA: Diagnosis not present

## 2022-07-25 DIAGNOSIS — R1312 Dysphagia, oropharyngeal phase: Secondary | ICD-10-CM | POA: Diagnosis not present

## 2022-07-25 DIAGNOSIS — R2689 Other abnormalities of gait and mobility: Secondary | ICD-10-CM | POA: Diagnosis not present

## 2022-07-26 ENCOUNTER — Ambulatory Visit (HOSPITAL_COMMUNITY)
Admission: RE | Admit: 2022-07-26 | Discharge: 2022-07-26 | Disposition: A | Discharge status: Home / Self Care | Payer: Medicare Other | Source: Ambulatory Visit | Attending: Internal Medicine | Admitting: Internal Medicine

## 2022-07-26 DIAGNOSIS — R413 Other amnesia: Secondary | ICD-10-CM

## 2022-07-26 NOTE — Procedures (Signed)
ELECTROENCEPHALOGRAM REPORT  Date of Study: 07/26/2022  Patient's Name: Phillip [name] MRN: [id number] Date of Birth: 01/19/1929  Referring Provider: Sharene Butters, PA-C  Clinical History: This is a 88 year old man with hallucinations, episode of urinary incontinence. EEG for classification.  Medications: Eliquis, Lexapro, Synthroid, Proscar, Toprol, Simvastatin, Hytrin, Diovan  Technical Summary: A multichannel digital EEG recording measured by the international 10-20 system with electrodes applied with paste and impedances below 5000 ohms performed in our laboratory with EKG monitoring in a predominantly drowsy and asleep patient.  Hyperventilation was not performed. Photic stimulation was performed.  The digital EEG was referentially recorded, reformatted, and digitally filtered in a variety of bipolar and referential montages for optimal display.    Description: The patient is predominantly drowsy and asleep during the recording.  During brief period of wakefulness, there is a symmetric, medium voltage 8 Hz posterior dominant rhythm that attenuates with eye opening.  The record is symmetric.  During drowsiness and sleep, there is an increase in theta and delta slowing of the background.  Vertex waves and symmetric sleep spindles were seen. Photic stimulation did not elicit any abnormalities.  There were no epileptiform discharges or electrographic seizures seen.    EKG lead was unremarkable.  Impression: This predominantly drowsy and asleep EEG is normal.    Clinical Correlation: A normal EEG does not exclude a clinical diagnosis of epilepsy.  If further clinical questions remain, prolonged EEG may be helpful.  Clinical correlation is advised.   Ellouise Newer, M.D.

## 2022-07-26 NOTE — Progress Notes (Signed)
Outpatient EEG complete - results pending.  

## 2022-07-27 DIAGNOSIS — R2689 Other abnormalities of gait and mobility: Secondary | ICD-10-CM | POA: Diagnosis not present

## 2022-07-27 DIAGNOSIS — K219 Gastro-esophageal reflux disease without esophagitis: Secondary | ICD-10-CM | POA: Diagnosis not present

## 2022-07-27 DIAGNOSIS — M6281 Muscle weakness (generalized): Secondary | ICD-10-CM | POA: Diagnosis not present

## 2022-07-27 DIAGNOSIS — F32A Depression, unspecified: Secondary | ICD-10-CM | POA: Diagnosis not present

## 2022-07-27 DIAGNOSIS — R1312 Dysphagia, oropharyngeal phase: Secondary | ICD-10-CM | POA: Diagnosis not present

## 2022-07-27 DIAGNOSIS — R41841 Cognitive communication deficit: Secondary | ICD-10-CM | POA: Diagnosis not present

## 2022-07-29 DIAGNOSIS — M6281 Muscle weakness (generalized): Secondary | ICD-10-CM | POA: Diagnosis not present

## 2022-07-29 DIAGNOSIS — R41841 Cognitive communication deficit: Secondary | ICD-10-CM | POA: Diagnosis not present

## 2022-07-29 DIAGNOSIS — F32A Depression, unspecified: Secondary | ICD-10-CM | POA: Diagnosis not present

## 2022-07-29 DIAGNOSIS — R1312 Dysphagia, oropharyngeal phase: Secondary | ICD-10-CM | POA: Diagnosis not present

## 2022-07-29 DIAGNOSIS — K219 Gastro-esophageal reflux disease without esophagitis: Secondary | ICD-10-CM | POA: Diagnosis not present

## 2022-07-29 DIAGNOSIS — R2689 Other abnormalities of gait and mobility: Secondary | ICD-10-CM | POA: Diagnosis not present

## 2022-07-30 DIAGNOSIS — F32A Depression, unspecified: Secondary | ICD-10-CM | POA: Diagnosis not present

## 2022-07-30 DIAGNOSIS — K219 Gastro-esophageal reflux disease without esophagitis: Secondary | ICD-10-CM | POA: Diagnosis not present

## 2022-07-30 DIAGNOSIS — R2689 Other abnormalities of gait and mobility: Secondary | ICD-10-CM | POA: Diagnosis not present

## 2022-07-30 DIAGNOSIS — R41841 Cognitive communication deficit: Secondary | ICD-10-CM | POA: Diagnosis not present

## 2022-07-30 DIAGNOSIS — M6281 Muscle weakness (generalized): Secondary | ICD-10-CM | POA: Diagnosis not present

## 2022-07-30 DIAGNOSIS — R1312 Dysphagia, oropharyngeal phase: Secondary | ICD-10-CM | POA: Diagnosis not present

## 2022-07-31 ENCOUNTER — Ambulatory Visit (INDEPENDENT_AMBULATORY_CARE_PROVIDER_SITE_OTHER): Payer: Medicare Other | Admitting: Cardiovascular Disease

## 2022-07-31 ENCOUNTER — Encounter: Payer: Self-pay | Admitting: Cardiovascular Disease

## 2022-07-31 VITALS — BP 114/80 | HR 79 | Ht 67.0 in | Wt 183.0 lb

## 2022-07-31 DIAGNOSIS — I10 Essential (primary) hypertension: Secondary | ICD-10-CM

## 2022-07-31 DIAGNOSIS — M6281 Muscle weakness (generalized): Secondary | ICD-10-CM | POA: Diagnosis not present

## 2022-07-31 DIAGNOSIS — R2689 Other abnormalities of gait and mobility: Secondary | ICD-10-CM | POA: Diagnosis not present

## 2022-07-31 DIAGNOSIS — R41841 Cognitive communication deficit: Secondary | ICD-10-CM | POA: Diagnosis not present

## 2022-07-31 DIAGNOSIS — E782 Mixed hyperlipidemia: Secondary | ICD-10-CM

## 2022-07-31 DIAGNOSIS — I4811 Longstanding persistent atrial fibrillation: Secondary | ICD-10-CM | POA: Diagnosis not present

## 2022-07-31 DIAGNOSIS — K219 Gastro-esophageal reflux disease without esophagitis: Secondary | ICD-10-CM | POA: Diagnosis not present

## 2022-07-31 DIAGNOSIS — R1312 Dysphagia, oropharyngeal phase: Secondary | ICD-10-CM | POA: Diagnosis not present

## 2022-07-31 DIAGNOSIS — F32A Depression, unspecified: Secondary | ICD-10-CM | POA: Diagnosis not present

## 2022-07-31 NOTE — Progress Notes (Signed)
07/31/2022 [name]   29-Oct-1929  [id number]  Primary Physician Phillip Dad, MD Primary Cardiologist: Phillip Harp MD Phillip [name], [place]  HPI:  Phillip [name] is a 88 y.o.  mildly overweight married Caucasian male (wife is in memory unit at friend's home) father of 38, grandfather of 9 grandchildren referred to me by Dr. Lyndel Safe for cardiovascular evaluation because of A. fib.  He is accompanied by 2 of his daughters, Phillip [name] and Phillip [name] today.  He is retired from working for [employer] .  He moved down to [place] from [place] back in 2012.  His risk factors include treated hyper tension and hyperlipidemia.  He is fairly active and was playing tennis once a week up until the onset of COVID-19.  He has A. fib dating back to 2008 and is TIAs in the past.  Has been on Eliquis oral anticoagulation since 2012.    Since I saw him 2 and half years ago he has become less active since COVID.  He recently had a UTI and apparently was seen in the ER with hallucinations.  His daughter stated since that time he has had less energy.  Specifically denies chest pain or shortness of breath. Current Meds  Medication Sig   ELIQUIS 5 MG TABS tablet TAKE 1 TABLET TWICE A DAY FOR ANTICOAGULATION   escitalopram (LEXAPRO) 10 MG tablet Take 1 tablet (10 mg total) by mouth daily.   famotidine (PEPCID) 20 MG tablet Take 20 mg by mouth daily as needed for heartburn or indigestion.   finasteride (PROSCAR) 5 MG tablet TAKE 1 TABLET DAILY FOR URINATING DRIBBLING/PROSTATE ENLARGEMENT   levothyroxine (SYNTHROID) 25 MCG tablet TAKE 1 TABLET DAILY BEFORE BREAKFAST   metoprolol succinate (TOPROL-XL) 50 MG 24 hr tablet TAKE 1 TABLET TWICE A DAY   WITH OR IMMEDIATELY FOLLOWING A MEAL TO CONTROL BLOOD PRESSURE AND HEART RHYTHM   Multiple Vitamins-Minerals (PRESERVISION AREDS) CAPS Take 2 capsules by mouth daily.   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 since 11/2010   Married - Phillip [name]   Does not have Living Will   Never smoked   Alcohol none   Exercise: tennis, pool, table tennis 2-3 times a week   Several daughters   Social Determinants of Health   Financial Resource Strain: Low Risk  (03/25/2022)   Overall Emergency planning/management officer Strain (  CARDIA)    Difficulty of Paying Living Expenses: Not hard at all  Food Insecurity: No Food Insecurity (03/25/2022)   Hunger Vital Sign    Worried About Running Out of Food in the Last Year: Never true    Ran Out of Food in the Last Year: Never true  Transportation Needs: No Transportation Needs (03/25/2022)   PRAPARE - Hydrologist (Medical): No    Lack of Transportation (Non-Medical): No  Physical Activity: Inactive (03/25/2022)   Exercise Vital Sign    Days of Exercise per Week: 0 days    Minutes of Exercise per Session: 0 min  Stress: No Stress Concern Present (03/25/2022)   [place]    Feeling of Stress : Only a little  Social Connections:  Moderately Isolated (03/25/2022)   Social Connection and Isolation Panel [NHANES]    Frequency of Communication with Friends and Family: More than three times a week    Frequency of Social Gatherings with Friends and Family: More than three times a week    Attends Religious Services: Never    Marine scientist or Organizations: No    Attends Archivist Meetings: Never    Marital Status: Married  Human resources officer Violence: Not At Risk (03/25/2022)   Humiliation, Afraid, Rape, and Kick questionnaire    Fear of Current or Ex-Partner: No    Emotionally Abused: No    Physically Abused: No    Sexually Abused: No     Review of Systems: General: negative for chills, fever, night sweats or weight changes.  Cardiovascular: negative for chest pain, dyspnea on exertion, edema, orthopnea, palpitations, paroxysmal nocturnal dyspnea or shortness of breath Dermatological: negative for rash Respiratory: negative for cough or wheezing Urologic: negative for hematuria Abdominal: negative for nausea, vomiting, diarrhea, bright red blood per rectum, melena, or hematemesis Neurologic: negative for visual changes, syncope, or dizziness All other systems reviewed and are otherwise negative except as noted above.    Blood pressure 114/80, pulse 79, height '5\' 7"'$  (1.702 m), weight 183 lb (83 kg), SpO2 93 %.  General appearance: alert and no distress Neck: no adenopathy, no carotid bruit, no JVD, supple, symmetrical, trachea midline, and thyroid not enlarged, symmetric, no tenderness/mass/nodules Lungs: clear to auscultation bilaterally Heart: regular rate and rhythm, S1, S2 normal, no murmur, click, rub or gallop Extremities: extremities normal, atraumatic, no cyanosis or edema Pulses: 2+ and symmetric Skin: Skin color, texture, turgor normal. No rashes or lesions  EKG atrial fibrillation with a ventricular sponsor of 79 and nonspecific ST and T wave changes.  Personally reviewed this  EKG.  ASSESSMENT AND PLAN:   Atrial fibrillation ([place]) History of persistent A-fib rate controlled on Eliquis oral anticoagulation.  Hyperlipidemia History of hyperlipidemia on statin therapy with lipid profile performed 5//23 revealing total cholesterol 130, LDL of 65 and HDL 44.  Essential hypertension History of essential hypertension blood pressure measured today at 114/80.  He is on metoprolol and valsartan.     Phillip Harp MD FACP,FACC,FAHA, [facility] 07/31/2022 4:02 PM

## 2022-07-31 NOTE — Assessment & Plan Note (Signed)
History of essential hypertension blood pressure measured today at 114/80.  He is on metoprolol and valsartan.

## 2022-07-31 NOTE — Assessment & Plan Note (Signed)
History of persistent A-fib rate controlled on Eliquis oral anticoagulation. 

## 2022-07-31 NOTE — Assessment & Plan Note (Signed)
History of hyperlipidemia on statin therapy with lipid profile performed 5//23 revealing total cholesterol 130, LDL of 65 and HDL 44.

## 2022-07-31 NOTE — Patient Instructions (Signed)

## 2022-08-01 DIAGNOSIS — M6281 Muscle weakness (generalized): Secondary | ICD-10-CM | POA: Diagnosis not present

## 2022-08-01 DIAGNOSIS — K219 Gastro-esophageal reflux disease without esophagitis: Secondary | ICD-10-CM | POA: Diagnosis not present

## 2022-08-01 DIAGNOSIS — R41841 Cognitive communication deficit: Secondary | ICD-10-CM | POA: Diagnosis not present

## 2022-08-01 DIAGNOSIS — F32A Depression, unspecified: Secondary | ICD-10-CM | POA: Diagnosis not present

## 2022-08-01 DIAGNOSIS — R1312 Dysphagia, oropharyngeal phase: Secondary | ICD-10-CM | POA: Diagnosis not present

## 2022-08-01 DIAGNOSIS — R2689 Other abnormalities of gait and mobility: Secondary | ICD-10-CM | POA: Diagnosis not present

## 2022-08-02 DIAGNOSIS — M6281 Muscle weakness (generalized): Secondary | ICD-10-CM | POA: Diagnosis not present

## 2022-08-02 DIAGNOSIS — R2689 Other abnormalities of gait and mobility: Secondary | ICD-10-CM | POA: Diagnosis not present

## 2022-08-02 DIAGNOSIS — F32A Depression, unspecified: Secondary | ICD-10-CM | POA: Diagnosis not present

## 2022-08-02 DIAGNOSIS — R41841 Cognitive communication deficit: Secondary | ICD-10-CM | POA: Diagnosis not present

## 2022-08-02 DIAGNOSIS — K219 Gastro-esophageal reflux disease without esophagitis: Secondary | ICD-10-CM | POA: Diagnosis not present

## 2022-08-02 DIAGNOSIS — R1312 Dysphagia, oropharyngeal phase: Secondary | ICD-10-CM | POA: Diagnosis not present

## 2022-08-05 DIAGNOSIS — R2689 Other abnormalities of gait and mobility: Secondary | ICD-10-CM | POA: Diagnosis not present

## 2022-08-05 DIAGNOSIS — R1312 Dysphagia, oropharyngeal phase: Secondary | ICD-10-CM | POA: Diagnosis not present

## 2022-08-05 DIAGNOSIS — M6281 Muscle weakness (generalized): Secondary | ICD-10-CM | POA: Diagnosis not present

## 2022-08-05 DIAGNOSIS — R41841 Cognitive communication deficit: Secondary | ICD-10-CM | POA: Diagnosis not present

## 2022-08-05 DIAGNOSIS — K219 Gastro-esophageal reflux disease without esophagitis: Secondary | ICD-10-CM | POA: Diagnosis not present

## 2022-08-05 DIAGNOSIS — F32A Depression, unspecified: Secondary | ICD-10-CM | POA: Diagnosis not present

## 2022-08-06 DIAGNOSIS — R1312 Dysphagia, oropharyngeal phase: Secondary | ICD-10-CM | POA: Diagnosis not present

## 2022-08-06 DIAGNOSIS — M2042 Other hammer toe(s) (acquired), left foot: Secondary | ICD-10-CM | POA: Diagnosis not present

## 2022-08-06 DIAGNOSIS — L602 Onychogryphosis: Secondary | ICD-10-CM | POA: Diagnosis not present

## 2022-08-06 DIAGNOSIS — R41841 Cognitive communication deficit: Secondary | ICD-10-CM | POA: Diagnosis not present

## 2022-08-06 DIAGNOSIS — R2689 Other abnormalities of gait and mobility: Secondary | ICD-10-CM | POA: Diagnosis not present

## 2022-08-06 DIAGNOSIS — M2041 Other hammer toe(s) (acquired), right foot: Secondary | ICD-10-CM | POA: Diagnosis not present

## 2022-08-06 DIAGNOSIS — M6281 Muscle weakness (generalized): Secondary | ICD-10-CM | POA: Diagnosis not present

## 2022-08-06 DIAGNOSIS — F32A Depression, unspecified: Secondary | ICD-10-CM | POA: Diagnosis not present

## 2022-08-06 DIAGNOSIS — K219 Gastro-esophageal reflux disease without esophagitis: Secondary | ICD-10-CM | POA: Diagnosis not present

## 2022-08-07 DIAGNOSIS — K219 Gastro-esophageal reflux disease without esophagitis: Secondary | ICD-10-CM | POA: Diagnosis not present

## 2022-08-07 DIAGNOSIS — R2689 Other abnormalities of gait and mobility: Secondary | ICD-10-CM | POA: Diagnosis not present

## 2022-08-07 DIAGNOSIS — M6281 Muscle weakness (generalized): Secondary | ICD-10-CM | POA: Diagnosis not present

## 2022-08-07 DIAGNOSIS — R1312 Dysphagia, oropharyngeal phase: Secondary | ICD-10-CM | POA: Diagnosis not present

## 2022-08-07 DIAGNOSIS — R41841 Cognitive communication deficit: Secondary | ICD-10-CM | POA: Diagnosis not present

## 2022-08-07 DIAGNOSIS — F32A Depression, unspecified: Secondary | ICD-10-CM | POA: Diagnosis not present

## 2022-08-08 DIAGNOSIS — R1312 Dysphagia, oropharyngeal phase: Secondary | ICD-10-CM | POA: Diagnosis not present

## 2022-08-08 DIAGNOSIS — R41841 Cognitive communication deficit: Secondary | ICD-10-CM | POA: Diagnosis not present

## 2022-08-08 DIAGNOSIS — K219 Gastro-esophageal reflux disease without esophagitis: Secondary | ICD-10-CM | POA: Diagnosis not present

## 2022-08-08 DIAGNOSIS — R2689 Other abnormalities of gait and mobility: Secondary | ICD-10-CM | POA: Diagnosis not present

## 2022-08-08 DIAGNOSIS — M6281 Muscle weakness (generalized): Secondary | ICD-10-CM | POA: Diagnosis not present

## 2022-08-08 DIAGNOSIS — F32A Depression, unspecified: Secondary | ICD-10-CM | POA: Diagnosis not present

## 2022-08-09 DIAGNOSIS — K219 Gastro-esophageal reflux disease without esophagitis: Secondary | ICD-10-CM | POA: Diagnosis not present

## 2022-08-09 DIAGNOSIS — R1312 Dysphagia, oropharyngeal phase: Secondary | ICD-10-CM | POA: Diagnosis not present

## 2022-08-09 DIAGNOSIS — M6281 Muscle weakness (generalized): Secondary | ICD-10-CM | POA: Diagnosis not present

## 2022-08-09 DIAGNOSIS — F32A Depression, unspecified: Secondary | ICD-10-CM | POA: Diagnosis not present

## 2022-08-09 DIAGNOSIS — R2689 Other abnormalities of gait and mobility: Secondary | ICD-10-CM | POA: Diagnosis not present

## 2022-08-09 DIAGNOSIS — R41841 Cognitive communication deficit: Secondary | ICD-10-CM | POA: Diagnosis not present

## 2022-08-12 DIAGNOSIS — K219 Gastro-esophageal reflux disease without esophagitis: Secondary | ICD-10-CM | POA: Diagnosis not present

## 2022-08-12 DIAGNOSIS — M6281 Muscle weakness (generalized): Secondary | ICD-10-CM | POA: Diagnosis not present

## 2022-08-12 DIAGNOSIS — F32A Depression, unspecified: Secondary | ICD-10-CM | POA: Diagnosis not present

## 2022-08-12 DIAGNOSIS — R2689 Other abnormalities of gait and mobility: Secondary | ICD-10-CM | POA: Diagnosis not present

## 2022-08-12 DIAGNOSIS — R41841 Cognitive communication deficit: Secondary | ICD-10-CM | POA: Diagnosis not present

## 2022-08-12 DIAGNOSIS — R1312 Dysphagia, oropharyngeal phase: Secondary | ICD-10-CM | POA: Diagnosis not present

## 2022-08-13 DIAGNOSIS — M6281 Muscle weakness (generalized): Secondary | ICD-10-CM | POA: Diagnosis not present

## 2022-08-13 DIAGNOSIS — K219 Gastro-esophageal reflux disease without esophagitis: Secondary | ICD-10-CM | POA: Diagnosis not present

## 2022-08-13 DIAGNOSIS — F32A Depression, unspecified: Secondary | ICD-10-CM | POA: Diagnosis not present

## 2022-08-13 DIAGNOSIS — R41841 Cognitive communication deficit: Secondary | ICD-10-CM | POA: Diagnosis not present

## 2022-08-13 DIAGNOSIS — R1312 Dysphagia, oropharyngeal phase: Secondary | ICD-10-CM | POA: Diagnosis not present

## 2022-08-13 DIAGNOSIS — R2689 Other abnormalities of gait and mobility: Secondary | ICD-10-CM | POA: Diagnosis not present

## 2022-08-14 DIAGNOSIS — F32A Depression, unspecified: Secondary | ICD-10-CM | POA: Diagnosis not present

## 2022-08-14 DIAGNOSIS — M6281 Muscle weakness (generalized): Secondary | ICD-10-CM | POA: Diagnosis not present

## 2022-08-14 DIAGNOSIS — K219 Gastro-esophageal reflux disease without esophagitis: Secondary | ICD-10-CM | POA: Diagnosis not present

## 2022-08-14 DIAGNOSIS — R41841 Cognitive communication deficit: Secondary | ICD-10-CM | POA: Diagnosis not present

## 2022-08-14 DIAGNOSIS — R2689 Other abnormalities of gait and mobility: Secondary | ICD-10-CM | POA: Diagnosis not present

## 2022-08-14 DIAGNOSIS — R1312 Dysphagia, oropharyngeal phase: Secondary | ICD-10-CM | POA: Diagnosis not present

## 2022-08-15 DIAGNOSIS — R41841 Cognitive communication deficit: Secondary | ICD-10-CM | POA: Diagnosis not present

## 2022-08-15 DIAGNOSIS — M6281 Muscle weakness (generalized): Secondary | ICD-10-CM | POA: Diagnosis not present

## 2022-08-15 DIAGNOSIS — K219 Gastro-esophageal reflux disease without esophagitis: Secondary | ICD-10-CM | POA: Diagnosis not present

## 2022-08-15 DIAGNOSIS — R2689 Other abnormalities of gait and mobility: Secondary | ICD-10-CM | POA: Diagnosis not present

## 2022-08-15 DIAGNOSIS — R1312 Dysphagia, oropharyngeal phase: Secondary | ICD-10-CM | POA: Diagnosis not present

## 2022-08-15 DIAGNOSIS — F32A Depression, unspecified: Secondary | ICD-10-CM | POA: Diagnosis not present

## 2022-08-16 DIAGNOSIS — K219 Gastro-esophageal reflux disease without esophagitis: Secondary | ICD-10-CM | POA: Diagnosis not present

## 2022-08-16 DIAGNOSIS — R1312 Dysphagia, oropharyngeal phase: Secondary | ICD-10-CM | POA: Diagnosis not present

## 2022-08-16 DIAGNOSIS — N390 Urinary tract infection, site not specified: Secondary | ICD-10-CM | POA: Diagnosis not present

## 2022-08-16 DIAGNOSIS — R29898 Other symptoms and signs involving the musculoskeletal system: Secondary | ICD-10-CM | POA: Diagnosis not present

## 2022-08-16 DIAGNOSIS — N3946 Mixed incontinence: Secondary | ICD-10-CM | POA: Diagnosis not present

## 2022-08-16 DIAGNOSIS — R2689 Other abnormalities of gait and mobility: Secondary | ICD-10-CM | POA: Diagnosis not present

## 2022-08-16 DIAGNOSIS — R41841 Cognitive communication deficit: Secondary | ICD-10-CM | POA: Diagnosis not present

## 2022-08-16 DIAGNOSIS — F32A Depression, unspecified: Secondary | ICD-10-CM | POA: Diagnosis not present

## 2022-08-16 DIAGNOSIS — M6281 Muscle weakness (generalized): Secondary | ICD-10-CM | POA: Diagnosis not present

## 2022-08-19 DIAGNOSIS — R2689 Other abnormalities of gait and mobility: Secondary | ICD-10-CM | POA: Diagnosis not present

## 2022-08-19 DIAGNOSIS — R1312 Dysphagia, oropharyngeal phase: Secondary | ICD-10-CM | POA: Diagnosis not present

## 2022-08-19 DIAGNOSIS — K219 Gastro-esophageal reflux disease without esophagitis: Secondary | ICD-10-CM | POA: Diagnosis not present

## 2022-08-19 DIAGNOSIS — F32A Depression, unspecified: Secondary | ICD-10-CM | POA: Diagnosis not present

## 2022-08-19 DIAGNOSIS — R41841 Cognitive communication deficit: Secondary | ICD-10-CM | POA: Diagnosis not present

## 2022-08-19 DIAGNOSIS — M6281 Muscle weakness (generalized): Secondary | ICD-10-CM | POA: Diagnosis not present

## 2022-08-20 DIAGNOSIS — K219 Gastro-esophageal reflux disease without esophagitis: Secondary | ICD-10-CM | POA: Diagnosis not present

## 2022-08-20 DIAGNOSIS — R41841 Cognitive communication deficit: Secondary | ICD-10-CM | POA: Diagnosis not present

## 2022-08-20 DIAGNOSIS — F32A Depression, unspecified: Secondary | ICD-10-CM | POA: Diagnosis not present

## 2022-08-20 DIAGNOSIS — R2689 Other abnormalities of gait and mobility: Secondary | ICD-10-CM | POA: Diagnosis not present

## 2022-08-20 DIAGNOSIS — R1312 Dysphagia, oropharyngeal phase: Secondary | ICD-10-CM | POA: Diagnosis not present

## 2022-08-20 DIAGNOSIS — M6281 Muscle weakness (generalized): Secondary | ICD-10-CM | POA: Diagnosis not present

## 2022-08-21 DIAGNOSIS — M6281 Muscle weakness (generalized): Secondary | ICD-10-CM | POA: Diagnosis not present

## 2022-08-21 DIAGNOSIS — K219 Gastro-esophageal reflux disease without esophagitis: Secondary | ICD-10-CM | POA: Diagnosis not present

## 2022-08-21 DIAGNOSIS — Z85828 Personal history of other malignant neoplasm of skin: Secondary | ICD-10-CM | POA: Diagnosis not present

## 2022-08-21 DIAGNOSIS — R531 Weakness: Secondary | ICD-10-CM | POA: Diagnosis not present

## 2022-08-21 DIAGNOSIS — L812 Freckles: Secondary | ICD-10-CM | POA: Diagnosis not present

## 2022-08-21 DIAGNOSIS — L821 Other seborrheic keratosis: Secondary | ICD-10-CM | POA: Diagnosis not present

## 2022-08-21 DIAGNOSIS — F32A Depression, unspecified: Secondary | ICD-10-CM | POA: Diagnosis not present

## 2022-08-21 DIAGNOSIS — L820 Inflamed seborrheic keratosis: Secondary | ICD-10-CM | POA: Diagnosis not present

## 2022-08-21 DIAGNOSIS — D1801 Hemangioma of skin and subcutaneous tissue: Secondary | ICD-10-CM | POA: Diagnosis not present

## 2022-08-21 DIAGNOSIS — R2689 Other abnormalities of gait and mobility: Secondary | ICD-10-CM | POA: Diagnosis not present

## 2022-08-21 DIAGNOSIS — R41841 Cognitive communication deficit: Secondary | ICD-10-CM | POA: Diagnosis not present

## 2022-08-21 DIAGNOSIS — R1312 Dysphagia, oropharyngeal phase: Secondary | ICD-10-CM | POA: Diagnosis not present

## 2022-08-21 DIAGNOSIS — L570 Actinic keratosis: Secondary | ICD-10-CM | POA: Diagnosis not present

## 2022-08-22 ENCOUNTER — Encounter: Payer: Self-pay | Admitting: Nurse Practitioner

## 2022-08-22 ENCOUNTER — Non-Acute Institutional Stay: Payer: Medicare Other | Admitting: Nurse Practitioner

## 2022-08-22 DIAGNOSIS — F5105 Insomnia due to other mental disorder: Secondary | ICD-10-CM | POA: Diagnosis not present

## 2022-08-22 DIAGNOSIS — K219 Gastro-esophageal reflux disease without esophagitis: Secondary | ICD-10-CM

## 2022-08-22 DIAGNOSIS — H3530 Unspecified macular degeneration: Secondary | ICD-10-CM | POA: Diagnosis not present

## 2022-08-22 DIAGNOSIS — R400 Somnolence: Secondary | ICD-10-CM

## 2022-08-22 DIAGNOSIS — I10 Essential (primary) hypertension: Secondary | ICD-10-CM

## 2022-08-22 DIAGNOSIS — R269 Unspecified abnormalities of gait and mobility: Secondary | ICD-10-CM | POA: Diagnosis not present

## 2022-08-22 DIAGNOSIS — R441 Visual hallucinations: Secondary | ICD-10-CM | POA: Diagnosis not present

## 2022-08-22 DIAGNOSIS — I4811 Longstanding persistent atrial fibrillation: Secondary | ICD-10-CM

## 2022-08-22 DIAGNOSIS — E039 Hypothyroidism, unspecified: Secondary | ICD-10-CM

## 2022-08-22 DIAGNOSIS — R350 Frequency of micturition: Secondary | ICD-10-CM

## 2022-08-22 DIAGNOSIS — E782 Mixed hyperlipidemia: Secondary | ICD-10-CM

## 2022-08-22 DIAGNOSIS — R2689 Other abnormalities of gait and mobility: Secondary | ICD-10-CM | POA: Diagnosis not present

## 2022-08-22 DIAGNOSIS — M19011 Primary osteoarthritis, right shoulder: Secondary | ICD-10-CM

## 2022-08-22 DIAGNOSIS — F418 Other specified anxiety disorders: Secondary | ICD-10-CM

## 2022-08-22 DIAGNOSIS — G3184 Mild cognitive impairment, so stated: Secondary | ICD-10-CM

## 2022-08-22 DIAGNOSIS — N401 Enlarged prostate with lower urinary tract symptoms: Secondary | ICD-10-CM | POA: Diagnosis not present

## 2022-08-22 DIAGNOSIS — M6281 Muscle weakness (generalized): Secondary | ICD-10-CM | POA: Diagnosis not present

## 2022-08-22 DIAGNOSIS — R41841 Cognitive communication deficit: Secondary | ICD-10-CM | POA: Diagnosis not present

## 2022-08-22 DIAGNOSIS — R1312 Dysphagia, oropharyngeal phase: Secondary | ICD-10-CM | POA: Diagnosis not present

## 2022-08-22 DIAGNOSIS — F32A Depression, unspecified: Secondary | ICD-10-CM | POA: Diagnosis not present

## 2022-08-22 NOTE — Assessment & Plan Note (Signed)
on Levothyroxine, TSH 4.03 04/18/22

## 2022-08-22 NOTE — Assessment & Plan Note (Signed)
heart rate is in control, taking Eliquis, Metoprolol. F/u Cardiology 

## 2022-08-22 NOTE — Assessment & Plan Note (Signed)
recent diagnosed 

## 2022-08-22 NOTE — Assessment & Plan Note (Signed)
urinary frequency, on Hytrin, Proscar

## 2022-08-22 NOTE — Assessment & Plan Note (Signed)
has been on Lexapro about 2-3 months. Na 139 06/22/22

## 2022-08-22 NOTE — Progress Notes (Signed)
Location:   clinic [place]   Place of Service:  Clinic (12) Provider: Marlana Latus NP  Code Status: DNR Goals of Care: [place]    07/04/2022    1:24 PM  Advanced Directives  Does Patient Have a Medical Advance Directive? Yes     Chief Complaint  Patient presents with  . Acute Visit    Patient is being seen for extreme  fatigue and low blood pressure     HPI: Patient is a 88 y.o. male seen today for sleepiness, UA negative, afebrile, denied cough, chest pain, HA, abd pain, or focal weakness.   HTN, on Metoprolol, Diovan, Bun/creat 17/1.18 06/22/22  HLD on statin, LDL 65 04/18/22  Hypothyroidism, on Levothyroxine, TSH 4.03 04/18/22  BPH, urinary frequency, on Hytrin, Proscar  Afib, heart rate is in control, taking Eliquis, Metoprolol. F/u Cardiology  OA R shoulder, X-ray showed DJD, on Tylenol.   MD, recent diagnosed  GERD stable on Famotidine.   Depression, has been on Lexapro about 2-3 months. Na 139 06/22/22  Visual hallucination, CT head negative, underwent neurology evaluation  Gait abnormality, uses walker.   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 Atrial fibrillation ([place]) 03/26/2011  . Basal cell carcinoma of other specified sites of skin 4/10/012  . Basal cell carcinoma of skin, site unspecified 03/25/2012  . Benign neoplasm of colon 03/25/2012   adenomatous polyps  . Broken arm 11/25/2017  . Colon polyp 2014  . Degeneration of lumbar or lumbosacral intervertebral disc 09/24/2011  . Disturbance of skin sensation 01/28/2012  . Diverticulosis 2014  . Dysphagia, unspecified(787.20) 03/26/2011  . Elevated prostate specific antigen (PSA) 03/26/2011  . Exposure to unspecified radiation 03/26/2011  . Family history of colon cancer 07/16/2013  . GERD (gastroesophageal reflux disease) 07/16/2013  . Hearing deficit 12/21/2013  . Hemangioma of unspecified site 03/26/2011  . Hx of adenomatous  colonic polyps 07/16/2013  . Hypertrophy of prostate with urinary obstruction and other lower urinary tract symptoms (LUTS) 03/26/2011  . Ingrowing nail 03/26/2011  . Internal hemorrhoids without mention of complication 07/22/75  . Keratosis, actinic 01/28/2012  . Long term (current) use of anticoagulants 03/26/2011  . Long term current use of anticoagulant therapy 07/23/2016  . Lumbago 03/26/2011  . 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 Osteoarthrosis, unspecified whether generalized or localized, unspecified site 11/24/2012  . Other and unspecified hyperlipidemia 03/26/2011  . Other malaise and fatigue 03/26/2011  . Pain in joint, forearm 11/24/2012  . Pain in joint, lower leg 06/15/13   left knee  . Paresthesia of left arm 07/05/2014  . Rectal bleed 06/15/13  . Reflux esophagitis 03/26/2011  . Sacroiliitis, not elsewhere classified ([place]) 05/28/2011  . Scoliosis (and kyphoscoliosis), idiopathic 05/28/2011  . 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 Tinnitus 12/21/2013  . 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 Unspecified essential hypertension 03/26/2011    Past Surgical History:  Procedure Laterality Date  . BASAL CELL CARCINOMA EXCISION Left 12/10/14   Dr. Jarome Matin  . COLONOSCOPY  08/12/2013   Dr. Hilarie Fredrickson adenomatous polyps  . CRYOTHERAPY Left 2009   hand  . PHOTODYNAMIC THERAPY  09/26/14   face and ear lobs  Dr. Jarome Matin  . SQUAMOUS CELL CARCINOMA EXCISION Left 2010   hand    Allergies  Allergen Reactions  . Ciprofloxacin Itching  Patient doesn't like the way it makes him feel  . Lisinopril Itching    unknown  . Sulfa Antibiotics Rash    Allergies as of 08/22/2022       Reactions   Ciprofloxacin Itching   Patient doesn't like the way it makes him feel   Lisinopril Itching   unknown   Sulfa Antibiotics Rash        Medication List         Accurate as of August 22, 2022  4:10 PM. If you have any questions, ask your nurse or doctor.          STOP taking these medications    acetaminophen 500 MG tablet Commonly known as: TYLENOL Stopped by: Anes Rigel X Octave Montrose, NP       TAKE these medications    Eliquis 5 MG Tabs tablet Generic drug: apixaban TAKE 1 TABLET TWICE A DAY FOR ANTICOAGULATION   escitalopram 10 MG tablet Commonly known as: Lexapro Take 1 tablet (10 mg total) by mouth daily.   famotidine 20 MG tablet Commonly known as: PEPCID Take 20 mg by mouth daily as needed for heartburn or indigestion.   finasteride 5 MG tablet Commonly known as: PROSCAR TAKE 1 TABLET DAILY FOR URINATING DRIBBLING/PROSTATE ENLARGEMENT   levothyroxine 25 MCG tablet Commonly known as: SYNTHROID TAKE 1 TABLET DAILY BEFORE BREAKFAST   metoprolol succinate 50 MG 24 hr tablet Commonly known as: TOPROL-XL TAKE 1 TABLET TWICE A DAY   WITH OR IMMEDIATELY FOLLOWING A MEAL TO CONTROL BLOOD PRESSURE AND HEART RHYTHM   PreserVision AREDS Caps Take 2 capsules by mouth daily.   simvastatin 10 MG tablet Commonly known as: ZOCOR TAKE 1 TABLET DAILY TO LOWER CHOLESTEROL   terazosin 2 MG capsule Commonly known as: HYTRIN TAKE 1 CAPSULE DAILY   valsartan 160 MG tablet Commonly known as: DIOVAN TAKE 1 TABLET DAILY        Review of Systems:  Review of Systems  Constitutional:  Positive for fatigue. Negative for appetite change and fever.  HENT:  Positive for hearing loss. Negative for congestion and trouble swallowing.   Eyes:  Negative for visual disturbance.  Respiratory:  Negative for cough and shortness of breath.   Gastrointestinal:  Negative for abdominal pain and constipation.  Genitourinary:  Positive for frequency. Negative for dysuria and urgency.  Musculoskeletal:  Positive for arthralgias and gait problem.  Skin:  Negative for color change.  Psychiatric/Behavioral:  Positive for dysphoric mood. Negative for confusion,  hallucinations and sleep disturbance. The patient is nervous/anxious.     Health Maintenance  Topic Date Due  . INFLUENZA VACCINE  07/16/2022  . COVID-19 Vaccine (4 - Moderna risk series) 11/13/2022 (Originally 10/31/2021)  . TETANUS/TDAP  09/04/2026  . Pneumonia Vaccine 33+ Years old  Completed  . Zoster Vaccines- Shingrix  Completed  . HPV VACCINES  Aged Out  . COLONOSCOPY (Pts 45-19yr Insurance coverage will need to be confirmed)  Discontinued    Physical Exam: Vitals:   08/22/22 1552  BP: (!) 150/100  Pulse: 85  Resp: 18  Temp: (!) 97.3 F (36.3 C)  SpO2: 99%  Weight: 183 lb (83 kg)  Height: 5' 7"  (1.702 m)   Body mass index is 28.66 kg/m. Physical Exam Constitutional:      Appearance: Normal appearance.  HENT:     Head: Normocephalic and atraumatic.     Nose: Nose normal.     Mouth/Throat:     Mouth: Mucous membranes are moist.  Eyes:  Extraocular Movements: Extraocular movements intact.     Conjunctiva/sclera: Conjunctivae normal.     Pupils: Pupils are equal, round, and reactive to light.  Cardiovascular:     Rate and Rhythm: Normal rate. Rhythm irregular.     Heart sounds: No murmur heard. Pulmonary:     Effort: Pulmonary effort is normal.     Breath sounds: No wheezing or rales.  Abdominal:     Palpations: Abdomen is soft.     Tenderness: There is no abdominal tenderness.  Musculoskeletal:        General: Normal range of motion.     Cervical back: Normal range of motion and neck supple.     Right lower leg: No edema.     Left lower leg: No edema.  Skin:    General: Skin is warm and dry.  Neurological:     General: No focal deficit present.     Mental Status: He is alert and oriented to person, place, and time. Mental status is at baseline.     Gait: Gait abnormal.  Psychiatric:        Mood and Affect: Mood normal.        Behavior: Behavior normal.        Thought Content: Thought content normal.    Labs reviewed: Basic Metabolic  Panel: Recent Labs    04/18/22 0850 06/20/22 1529 06/22/22 1557  NA 140 138 139  K 3.8 4.6 4.3  CL 105 104 103  CO2 28 24 27   GLUCOSE 115* 109* 107*  BUN 17 14 17   CREATININE 1.08 1.05 1.18  CALCIUM 9.6 9.2 9.9  TSH 4.03  --   --    Liver Function Tests: Recent Labs    10/17/21 0843 04/18/22 0850 06/20/22 1529  AST 16 18 19   ALT 10 12 12   ALKPHOS  --   --  79  BILITOT 0.8 0.5 1.4*  PROT 6.4 6.7 6.8  ALBUMIN  --   --  3.5   Recent Labs    06/20/22 1529  LIPASE 33   No results for input(s): "AMMONIA" in the last 8760 hours. CBC: Recent Labs    10/17/21 0843 04/18/22 0850 06/20/22 1529 06/22/22 1557  WBC 7.1 7.7 13.3* 8.3  NEUTROABS 4,196 5,529 10.9*  --   HGB 15.0 15.2 15.2 14.6  HCT 43.3 44.1 45.3 43.1  MCV 96.2 95.2 96.8 96.9  PLT 185 187 175 201   Lipid Panel: Recent Labs    10/17/21 0843 04/18/22 0850  CHOL 117 130  HDL 43 44  LDLCALC 54 65  TRIG 116 120  CHOLHDL 2.7 3.0   No results found for: "HGBA1C"  Procedures since last visit: EEG adult  Result Date: 07/26/2022 Cameron Sprang, MD     07/26/2022  1:16 PM ELECTROENCEPHALOGRAM REPORT Date of Study: 07/26/2022 Patient's Name: Phillip [name] MRN: [id number] Date of Birth: January 11, 1929 Referring Provider: Sharene Butters, PA-C Clinical History: This is a 88 year old Denman Pichardo with hallucinations, episode of urinary incontinence. EEG for classification. Medications: Eliquis, Lexapro, Synthroid, Proscar, Toprol, Simvastatin, Hytrin, Diovan Technical Summary: A multichannel digital EEG recording measured by the international 10-20 system with electrodes applied with paste and impedances below 5000 ohms performed in our laboratory with EKG monitoring in a predominantly drowsy and asleep patient.  Hyperventilation was not performed. Photic stimulation was performed.  The digital EEG was referentially recorded, reformatted, and digitally filtered in a variety of bipolar and referential montages for optimal display.   Description: The  patient is predominantly drowsy and asleep during the recording.  During brief period of wakefulness, there is a symmetric, medium voltage 8 Hz posterior dominant rhythm that attenuates with eye opening.  The record is symmetric.  During drowsiness and sleep, there is an increase in theta and delta slowing of the background.  Vertex waves and symmetric sleep spindles were seen. Photic stimulation did not elicit any abnormalities.  There were no epileptiform discharges or electrographic seizures seen.  EKG lead was unremarkable. Impression: This predominantly drowsy and asleep EEG is normal.  Clinical Correlation: A normal EEG does not exclude a clinical diagnosis of epilepsy.  If further clinical questions remain, prolonged EEG may be helpful.  Clinical correlation is advised. Ellouise Newer, M.D.    Assessment/Plan  Sleepiness sleepiness, UA negative, afebrile, denied cough, chest pain, HA, abd pain, or focal weakness. Update CBC/diff, CMP/eGFR, will reduce Lexapro to 26m qd.   Essential hypertension Bp runs high, continue Metoprolol, will increase Diovan 3252m160mg, Bun/creat 17/1.18 06/22/22  Hyperlipidemia on statin, LDL 65 04/18/22  Hypothyroidism  on Levothyroxine, TSH 4.03 04/18/22  Benign prostatic hyperplasia with urinary frequency  urinary frequency, on Hytrin, Proscar  Atrial fibrillation (HCC) heart rate is in control, taking Eliquis, Metoprolol. F/u Cardiology  Osteoarthritis of right shoulder  R shoulder, X-ray showed DJD, on Tylenol.   Macular degeneration recent diagnosed  GERD (gastroesophageal reflux disease) stable on Famotidine.   Insomnia secondary to depression with anxiety has been on Lexapro about 2-3 months. Na 139 06/22/22  Visual hallucination CT head negative, underwent neurology evaluation, not present  Gait abnormality  uses walker.   Mild cognitive impairment  MMSE 27/30, CT head microvascular ischemic disease and cerebral  atrophy   Labs/tests ordered:  CBC/diff, CMP/eGFR  Next appt:  10/17/2022

## 2022-08-22 NOTE — Assessment & Plan Note (Addendum)
Bp runs high, continue Metoprolol, will increase Diovan '320mg'$ /'160mg'$ , Bun/creat 17/1.18 06/22/22 08/23/22 Na 141, K 3.8, Bun 16, creat 1.08

## 2022-08-22 NOTE — Assessment & Plan Note (Signed)
uses walker.  

## 2022-08-22 NOTE — Assessment & Plan Note (Addendum)
CT head negative, underwent neurology evaluation, not present

## 2022-08-22 NOTE — Assessment & Plan Note (Signed)
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

## 2022-08-22 NOTE — Assessment & Plan Note (Signed)
R shoulder, X-ray showed DJD, on Tylenol.  

## 2022-08-22 NOTE — Assessment & Plan Note (Signed)
on statin, LDL 65 04/18/22

## 2022-08-22 NOTE — Assessment & Plan Note (Signed)
stable on Famotidine.

## 2022-08-22 NOTE — Assessment & Plan Note (Signed)
sleepiness, UA negative, afebrile, denied cough, chest pain, HA, abd pain, or focal weakness. Update CBC/diff, CMP/eGFR, will reduce Lexapro to 66m qd.

## 2022-08-23 DIAGNOSIS — R1312 Dysphagia, oropharyngeal phase: Secondary | ICD-10-CM | POA: Diagnosis not present

## 2022-08-23 DIAGNOSIS — F32A Depression, unspecified: Secondary | ICD-10-CM | POA: Diagnosis not present

## 2022-08-23 DIAGNOSIS — R2689 Other abnormalities of gait and mobility: Secondary | ICD-10-CM | POA: Diagnosis not present

## 2022-08-23 DIAGNOSIS — M6281 Muscle weakness (generalized): Secondary | ICD-10-CM | POA: Diagnosis not present

## 2022-08-23 DIAGNOSIS — R531 Weakness: Secondary | ICD-10-CM | POA: Diagnosis not present

## 2022-08-23 DIAGNOSIS — R41841 Cognitive communication deficit: Secondary | ICD-10-CM | POA: Diagnosis not present

## 2022-08-23 DIAGNOSIS — K219 Gastro-esophageal reflux disease without esophagitis: Secondary | ICD-10-CM | POA: Diagnosis not present

## 2022-08-26 ENCOUNTER — Encounter: Payer: Self-pay | Admitting: Nurse Practitioner

## 2022-08-26 DIAGNOSIS — K219 Gastro-esophageal reflux disease without esophagitis: Secondary | ICD-10-CM | POA: Diagnosis not present

## 2022-08-26 DIAGNOSIS — R2689 Other abnormalities of gait and mobility: Secondary | ICD-10-CM | POA: Diagnosis not present

## 2022-08-26 DIAGNOSIS — M6281 Muscle weakness (generalized): Secondary | ICD-10-CM | POA: Diagnosis not present

## 2022-08-26 DIAGNOSIS — R1312 Dysphagia, oropharyngeal phase: Secondary | ICD-10-CM | POA: Diagnosis not present

## 2022-08-26 DIAGNOSIS — R41841 Cognitive communication deficit: Secondary | ICD-10-CM | POA: Diagnosis not present

## 2022-08-26 DIAGNOSIS — F32A Depression, unspecified: Secondary | ICD-10-CM | POA: Diagnosis not present

## 2022-08-27 DIAGNOSIS — K219 Gastro-esophageal reflux disease without esophagitis: Secondary | ICD-10-CM | POA: Diagnosis not present

## 2022-08-27 DIAGNOSIS — F32A Depression, unspecified: Secondary | ICD-10-CM | POA: Diagnosis not present

## 2022-08-27 DIAGNOSIS — R2689 Other abnormalities of gait and mobility: Secondary | ICD-10-CM | POA: Diagnosis not present

## 2022-08-27 DIAGNOSIS — R1312 Dysphagia, oropharyngeal phase: Secondary | ICD-10-CM | POA: Diagnosis not present

## 2022-08-27 DIAGNOSIS — M6281 Muscle weakness (generalized): Secondary | ICD-10-CM | POA: Diagnosis not present

## 2022-08-27 DIAGNOSIS — R41841 Cognitive communication deficit: Secondary | ICD-10-CM | POA: Diagnosis not present

## 2022-08-28 ENCOUNTER — Other Ambulatory Visit: Payer: Self-pay | Admitting: Family Medicine

## 2022-08-28 DIAGNOSIS — K219 Gastro-esophageal reflux disease without esophagitis: Secondary | ICD-10-CM | POA: Diagnosis not present

## 2022-08-28 DIAGNOSIS — F32A Depression, unspecified: Secondary | ICD-10-CM | POA: Diagnosis not present

## 2022-08-28 DIAGNOSIS — M6281 Muscle weakness (generalized): Secondary | ICD-10-CM | POA: Diagnosis not present

## 2022-08-28 DIAGNOSIS — R2689 Other abnormalities of gait and mobility: Secondary | ICD-10-CM | POA: Diagnosis not present

## 2022-08-28 DIAGNOSIS — R41841 Cognitive communication deficit: Secondary | ICD-10-CM | POA: Diagnosis not present

## 2022-08-28 DIAGNOSIS — R1312 Dysphagia, oropharyngeal phase: Secondary | ICD-10-CM | POA: Diagnosis not present

## 2022-08-29 DIAGNOSIS — K219 Gastro-esophageal reflux disease without esophagitis: Secondary | ICD-10-CM | POA: Diagnosis not present

## 2022-08-29 DIAGNOSIS — M6281 Muscle weakness (generalized): Secondary | ICD-10-CM | POA: Diagnosis not present

## 2022-08-29 DIAGNOSIS — F32A Depression, unspecified: Secondary | ICD-10-CM | POA: Diagnosis not present

## 2022-08-29 DIAGNOSIS — R41841 Cognitive communication deficit: Secondary | ICD-10-CM | POA: Diagnosis not present

## 2022-08-29 DIAGNOSIS — R2689 Other abnormalities of gait and mobility: Secondary | ICD-10-CM | POA: Diagnosis not present

## 2022-08-29 DIAGNOSIS — R1312 Dysphagia, oropharyngeal phase: Secondary | ICD-10-CM | POA: Diagnosis not present

## 2022-08-30 DIAGNOSIS — R1312 Dysphagia, oropharyngeal phase: Secondary | ICD-10-CM | POA: Diagnosis not present

## 2022-08-30 DIAGNOSIS — K219 Gastro-esophageal reflux disease without esophagitis: Secondary | ICD-10-CM | POA: Diagnosis not present

## 2022-08-30 DIAGNOSIS — M6281 Muscle weakness (generalized): Secondary | ICD-10-CM | POA: Diagnosis not present

## 2022-08-30 DIAGNOSIS — F32A Depression, unspecified: Secondary | ICD-10-CM | POA: Diagnosis not present

## 2022-08-30 DIAGNOSIS — R2689 Other abnormalities of gait and mobility: Secondary | ICD-10-CM | POA: Diagnosis not present

## 2022-08-30 DIAGNOSIS — R41841 Cognitive communication deficit: Secondary | ICD-10-CM | POA: Diagnosis not present

## 2022-09-02 DIAGNOSIS — M6281 Muscle weakness (generalized): Secondary | ICD-10-CM | POA: Diagnosis not present

## 2022-09-02 DIAGNOSIS — F32A Depression, unspecified: Secondary | ICD-10-CM | POA: Diagnosis not present

## 2022-09-02 DIAGNOSIS — R2689 Other abnormalities of gait and mobility: Secondary | ICD-10-CM | POA: Diagnosis not present

## 2022-09-02 DIAGNOSIS — R41841 Cognitive communication deficit: Secondary | ICD-10-CM | POA: Diagnosis not present

## 2022-09-02 DIAGNOSIS — K219 Gastro-esophageal reflux disease without esophagitis: Secondary | ICD-10-CM | POA: Diagnosis not present

## 2022-09-02 DIAGNOSIS — R1312 Dysphagia, oropharyngeal phase: Secondary | ICD-10-CM | POA: Diagnosis not present

## 2022-09-03 DIAGNOSIS — R1312 Dysphagia, oropharyngeal phase: Secondary | ICD-10-CM | POA: Diagnosis not present

## 2022-09-03 DIAGNOSIS — K219 Gastro-esophageal reflux disease without esophagitis: Secondary | ICD-10-CM | POA: Diagnosis not present

## 2022-09-03 DIAGNOSIS — M6281 Muscle weakness (generalized): Secondary | ICD-10-CM | POA: Diagnosis not present

## 2022-09-03 DIAGNOSIS — R41841 Cognitive communication deficit: Secondary | ICD-10-CM | POA: Diagnosis not present

## 2022-09-03 DIAGNOSIS — R2689 Other abnormalities of gait and mobility: Secondary | ICD-10-CM | POA: Diagnosis not present

## 2022-09-03 DIAGNOSIS — F32A Depression, unspecified: Secondary | ICD-10-CM | POA: Diagnosis not present

## 2022-09-04 DIAGNOSIS — R1312 Dysphagia, oropharyngeal phase: Secondary | ICD-10-CM | POA: Diagnosis not present

## 2022-09-04 DIAGNOSIS — R41841 Cognitive communication deficit: Secondary | ICD-10-CM | POA: Diagnosis not present

## 2022-09-04 DIAGNOSIS — M6281 Muscle weakness (generalized): Secondary | ICD-10-CM | POA: Diagnosis not present

## 2022-09-04 DIAGNOSIS — R2689 Other abnormalities of gait and mobility: Secondary | ICD-10-CM | POA: Diagnosis not present

## 2022-09-04 DIAGNOSIS — K219 Gastro-esophageal reflux disease without esophagitis: Secondary | ICD-10-CM | POA: Diagnosis not present

## 2022-09-04 DIAGNOSIS — F32A Depression, unspecified: Secondary | ICD-10-CM | POA: Diagnosis not present

## 2022-09-05 DIAGNOSIS — K219 Gastro-esophageal reflux disease without esophagitis: Secondary | ICD-10-CM | POA: Diagnosis not present

## 2022-09-05 DIAGNOSIS — M6281 Muscle weakness (generalized): Secondary | ICD-10-CM | POA: Diagnosis not present

## 2022-09-05 DIAGNOSIS — R2689 Other abnormalities of gait and mobility: Secondary | ICD-10-CM | POA: Diagnosis not present

## 2022-09-05 DIAGNOSIS — R41841 Cognitive communication deficit: Secondary | ICD-10-CM | POA: Diagnosis not present

## 2022-09-05 DIAGNOSIS — R1312 Dysphagia, oropharyngeal phase: Secondary | ICD-10-CM | POA: Diagnosis not present

## 2022-09-05 DIAGNOSIS — F32A Depression, unspecified: Secondary | ICD-10-CM | POA: Diagnosis not present

## 2022-09-09 ENCOUNTER — Other Ambulatory Visit: Payer: Self-pay | Admitting: Internal Medicine

## 2022-09-09 DIAGNOSIS — K219 Gastro-esophageal reflux disease without esophagitis: Secondary | ICD-10-CM | POA: Diagnosis not present

## 2022-09-09 DIAGNOSIS — R1312 Dysphagia, oropharyngeal phase: Secondary | ICD-10-CM | POA: Diagnosis not present

## 2022-09-09 DIAGNOSIS — F32A Depression, unspecified: Secondary | ICD-10-CM | POA: Diagnosis not present

## 2022-09-09 DIAGNOSIS — M6281 Muscle weakness (generalized): Secondary | ICD-10-CM | POA: Diagnosis not present

## 2022-09-09 DIAGNOSIS — R41841 Cognitive communication deficit: Secondary | ICD-10-CM | POA: Diagnosis not present

## 2022-09-09 DIAGNOSIS — R2689 Other abnormalities of gait and mobility: Secondary | ICD-10-CM | POA: Diagnosis not present

## 2022-09-10 ENCOUNTER — Non-Acute Institutional Stay: Payer: Medicare Other | Admitting: Nurse Practitioner

## 2022-09-10 ENCOUNTER — Encounter: Payer: Self-pay | Admitting: Nurse Practitioner

## 2022-09-10 DIAGNOSIS — E039 Hypothyroidism, unspecified: Secondary | ICD-10-CM | POA: Diagnosis not present

## 2022-09-10 DIAGNOSIS — M6281 Muscle weakness (generalized): Secondary | ICD-10-CM | POA: Diagnosis not present

## 2022-09-10 DIAGNOSIS — I10 Essential (primary) hypertension: Secondary | ICD-10-CM | POA: Diagnosis not present

## 2022-09-10 DIAGNOSIS — F32A Depression, unspecified: Secondary | ICD-10-CM | POA: Diagnosis not present

## 2022-09-10 DIAGNOSIS — R441 Visual hallucinations: Secondary | ICD-10-CM | POA: Diagnosis not present

## 2022-09-10 DIAGNOSIS — R1312 Dysphagia, oropharyngeal phase: Secondary | ICD-10-CM | POA: Diagnosis not present

## 2022-09-10 DIAGNOSIS — N401 Enlarged prostate with lower urinary tract symptoms: Secondary | ICD-10-CM

## 2022-09-10 DIAGNOSIS — I4811 Longstanding persistent atrial fibrillation: Secondary | ICD-10-CM | POA: Diagnosis not present

## 2022-09-10 DIAGNOSIS — K219 Gastro-esophageal reflux disease without esophagitis: Secondary | ICD-10-CM

## 2022-09-10 DIAGNOSIS — E782 Mixed hyperlipidemia: Secondary | ICD-10-CM | POA: Diagnosis not present

## 2022-09-10 DIAGNOSIS — R2689 Other abnormalities of gait and mobility: Secondary | ICD-10-CM | POA: Diagnosis not present

## 2022-09-10 DIAGNOSIS — R350 Frequency of micturition: Secondary | ICD-10-CM | POA: Diagnosis not present

## 2022-09-10 DIAGNOSIS — G3184 Mild cognitive impairment, so stated: Secondary | ICD-10-CM

## 2022-09-10 DIAGNOSIS — F418 Other specified anxiety disorders: Secondary | ICD-10-CM | POA: Diagnosis not present

## 2022-09-10 DIAGNOSIS — M19011 Primary osteoarthritis, right shoulder: Secondary | ICD-10-CM | POA: Diagnosis not present

## 2022-09-10 DIAGNOSIS — R41841 Cognitive communication deficit: Secondary | ICD-10-CM | POA: Diagnosis not present

## 2022-09-10 DIAGNOSIS — F5105 Insomnia due to other mental disorder: Secondary | ICD-10-CM | POA: Diagnosis not present

## 2022-09-10 NOTE — Assessment & Plan Note (Signed)
on statin, LDL 65 04/18/22

## 2022-09-10 NOTE — Assessment & Plan Note (Signed)
Blood pressure is controlled, on Metoprolol, Diovan, Bun/creat 16/1.08 08/23/22

## 2022-09-10 NOTE — Progress Notes (Signed)
Location:   [place] Room Number: 144 Place of Service:  ALF (13) Provider: Lennie Odor Makyi Ledo NP  Virgie Dad, MD  Patient Care Team: Virgie Dad, MD as PCP - General (Internal Medicine) Jarome Matin, MD as Consulting Physician (Dermatology) [facility], [facility] Wardell Honour, MD as Attending Physician [facility] Medicine)  Extended Emergency Contact Information Primary Emergency Contact: [name],[name] Address: [place], [place] [address] [address] of [place] Phone: [phone] Relation: Daughter Secondary Emergency Contact: [place] of [place] Mobile Phone: [phone] Relation: Daughter  Code Status:  DNR Goals of care: Advanced Directive information    07/04/2022    1:24 PM  Advanced Directives  Does Patient Have a Medical Advance Directive? Yes     Chief Complaint  Patient presents with   Medical Management of Chronic Issues    HPI:  Pt is a 88 y.o. male seen today for medical management of chronic diseases.    HTN, on Metoprolol, Diovan, Bun/creat 16/1.08 08/23/22             HLD on statin, LDL 65 04/18/22             Hypothyroidism, on Levothyroxine, TSH 4.03 04/18/22             BPH, urinary frequency, on Hytrin, Proscar             Afib, heart rate is in control, taking Eliquis, Metoprolol. F/u Cardiology             OA R shoulder, X-ray showed DJD, on Tylenol.              MD, recent diagnosed             GERD stable on Famotidine. Hgb 14.8 08/23/22             Depression, has been on Lexapro about 2-3 months. Na 139 06/22/22             Visual hallucination, CT head negative, underwent neurology evaluation             Gait abnormality, uses walker.              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 Vit B12 465 07/04/22    Past Medical History:  Diagnosis Date   Atrial fibrillation ([place]) 03/26/2011   Basal cell carcinoma of other  specified sites of skin 4/10/012   Basal cell carcinoma of skin, site unspecified 03/25/2012   Benign neoplasm of colon 03/25/2012   adenomatous polyps   Broken arm 11/25/2017   Colon polyp 2014   Degeneration of lumbar or lumbosacral intervertebral disc 09/24/2011   Disturbance of skin sensation 01/28/2012   Diverticulosis 2014   Dysphagia, unspecified(787.20) 03/26/2011   Elevated prostate specific antigen (PSA) 03/26/2011   Exposure to unspecified radiation 03/26/2011   Family history of colon cancer 07/16/2013   GERD (gastroesophageal reflux disease) 07/16/2013   Hearing deficit 12/21/2013   Hemangioma of unspecified site 03/26/2011   Hx of adenomatous colonic polyps 07/16/2013   Hypertrophy of prostate with urinary obstruction and other lower urinary tract symptoms (LUTS) 03/26/2011   Ingrowing nail 03/26/2011   Internal hemorrhoids without mention of complication 08/24/82   Keratosis, actinic 01/28/2012   Long term (current) use of anticoagulants 03/26/2011   Long term current use of anticoagulant therapy 07/23/2016   Lumbago 03/26/2011  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   Osteoarthrosis, unspecified whether generalized or localized, unspecified site 11/24/2012   Other and unspecified hyperlipidemia 03/26/2011   Other malaise and fatigue 03/26/2011   Pain in joint, forearm 11/24/2012   Pain in joint, lower leg 06/15/13   left knee   Paresthesia of left arm 07/05/2014   Rectal bleed 06/15/13   Reflux esophagitis 03/26/2011   Sacroiliitis, not elsewhere classified ([place]) 05/28/2011   Scoliosis (and kyphoscoliosis), idiopathic 05/28/2011   Squamous cell carcinoma of skin of upper limb, including shoulder 03/26/2011   Tinnitus 12/21/2013   Transient ischemic attack (TIA), and cerebral infarction without residual deficits(V12.54) 03/26/2011   Unspecified essential hypertension 03/26/2011   Past Surgical History:  Procedure Laterality Date   BASAL CELL CARCINOMA EXCISION Left 12/10/14    Dr. Jarome Matin   COLONOSCOPY  08/12/2013   Dr. Hilarie Fredrickson adenomatous polyps   CRYOTHERAPY Left 2009   hand   PHOTODYNAMIC THERAPY  09/26/14   face and ear lobs  Dr. Jarome Matin   SQUAMOUS CELL CARCINOMA EXCISION Left 2010   hand    Allergies  Allergen Reactions   Ciprofloxacin Itching    Patient doesn't like the way it makes him feel   Lisinopril Itching    unknown   Sulfa Antibiotics Rash    Allergies as of 09/10/2022       Reactions   Ciprofloxacin Itching   Patient doesn't like the way it makes him feel   Lisinopril Itching   unknown   Sulfa Antibiotics Rash        Medication List        Accurate as of September 10, 2022  4:15 PM. If you have any questions, ask your nurse or doctor.          Eliquis 5 MG Tabs tablet Generic drug: apixaban TAKE 1 TABLET TWICE A DAY FOR ANTICOAGULATION   escitalopram 10 MG tablet Commonly known as: LEXAPRO TAKE 1 TABLET DAILY   famotidine 20 MG tablet Commonly known as: PEPCID Take 20 mg by mouth daily as needed for heartburn or indigestion.   finasteride 5 MG tablet Commonly known as: PROSCAR TAKE 1 TABLET DAILY FOR URINATING DRIBBLING/PROSTATE ENLARGEMENT   levothyroxine 25 MCG tablet Commonly known as: SYNTHROID TAKE 1 TABLET DAILY BEFORE BREAKFAST   metoprolol succinate 50 MG 24 hr tablet Commonly known as: TOPROL-XL TAKE 1 TABLET TWICE A DAY   WITH OR IMMEDIATELY FOLLOWING A MEAL TO CONTROL BLOOD PRESSURE AND HEART RHYTHM   PreserVision AREDS Caps Take 2 capsules by mouth daily.   simvastatin 10 MG tablet Commonly known as: ZOCOR TAKE 1 TABLET DAILY TO LOWER CHOLESTEROL   terazosin 2 MG capsule Commonly known as: HYTRIN TAKE 1 CAPSULE DAILY   valsartan 160 MG tablet Commonly known as: DIOVAN TAKE 1 TABLET DAILY        Review of Systems  Constitutional:  Positive for fatigue. Negative for appetite change and fever.  HENT:  Positive for hearing loss. Negative for congestion and trouble  swallowing.   Eyes:  Negative for visual disturbance.  Respiratory:  Negative for cough and shortness of breath.   Gastrointestinal:  Negative for abdominal pain and constipation.  Genitourinary:  Positive for frequency. Negative for dysuria and urgency.  Musculoskeletal:  Positive for arthralgias and gait problem.  Skin:  Negative for color change.  Psychiatric/Behavioral:  Positive for dysphoric mood. Negative for confusion, hallucinations and sleep disturbance. The patient is not nervous/anxious.  Feels better    Immunization History  Administered Date(s) Administered   Influenza Whole 09/15/2012, 09/16/2013   Influenza, High Dose Seasonal PF 09/24/2017, 09/20/2021   Influenza,inj,Quad PF,6+ Mos 09/17/2018   Influenza-Unspecified 09/29/2014, 09/14/2015, 09/26/2016, 09/05/2020   Moderna Covid-19 Vaccine Bivalent Booster 35yr & up 09/05/2021   Moderna SARS-COV2 Booster Vaccination 12/31/2019, 05/23/2021   Moderna Sars-Covid-2 Vaccination 12/20/2019, 01/17/2020   PPD Test 11/27/2010   Pneumococcal Conjugate-13 02/08/2016   Pneumococcal Polysaccharide-23 01/17/1996   Td 02/14/2006, 09/04/2016   Zoster Recombinat (Shingrix) 03/31/2018, 06/23/2018   Pertinent  Health Maintenance Due  Topic Date Due   INFLUENZA VACCINE  07/16/2022   COLONOSCOPY (Pts 45-454yrInsurance coverage will need to be confirmed)  Discontinued      05/28/2022    2:48 PM 06/05/2022    9:31 AM 06/20/2022    3:15 PM 06/22/2022    3:41 PM 07/04/2022    1:24 PM  Fa[place]gn the past year? 0 1   1  Was there an injury with Fall? 0 0   0  Fall Risk Category Calculator 0 1   2  Fall Risk Category Low Low   Moderate  Patient Fall Risk Level  Low fall risk Moderate fall risk Moderate fall risk High fall risk  Patient at Risk for Falls Due to No Fall Risks History of fall(s)     Fall risk Follow up Falls evaluation completed Falls evaluation completed      Functional Status Survey:    Vitals:    09/10/22 1401  BP: (!) 148/70  Pulse: 82  Resp: 18  Temp: 97.8 F (36.6 C)  SpO2: 99%   There is no height or weight on file to calculate BMI. Physical Exam Constitutional:      Appearance: Normal appearance.  HENT:     Head: Normocephalic and atraumatic.     Nose: Nose normal.     Mouth/Throat:     Mouth: Mucous membranes are moist.  Eyes:     Extraocular Movements: Extraocular movements intact.     Conjunctiva/sclera: Conjunctivae normal.     Pupils: Pupils are equal, round, and reactive to light.  Cardiovascular:     Rate and Rhythm: Normal rate. Rhythm irregular.     Heart sounds: No murmur heard. Pulmonary:     Effort: Pulmonary effort is normal.     Breath sounds: No wheezing or rales.  Abdominal:     Palpations: Abdomen is soft.     Tenderness: There is no abdominal tenderness.  Musculoskeletal:        General: Normal range of motion.     Cervical back: Normal range of motion and neck supple.     Right lower leg: No edema.     Left lower leg: No edema.  Skin:    General: Skin is warm and dry.  Neurological:     General: No focal deficit present.     Mental Status: He is alert and oriented to person, place, and time. Mental status is at baseline.     Gait: Gait abnormal.  Psychiatric:        Mood and Affect: Mood normal.        Behavior: Behavior normal.        Thought Content: Thought content normal.     Labs reviewed: Recent Labs    04/18/22 0850 06/20/22 1529 06/22/22 1557  NA 140 138 139  K 3.8 4.6 4.3  CL 105 104 103  CO2 28 24  27  GLUCOSE 115* 109* 107*  BUN '17 14 17  '$ CREATININE 1.08 1.05 1.18  CALCIUM 9.6 9.2 9.9   Recent Labs    10/17/21 0843 04/18/22 0850 06/20/22 1529  AST '16 18 19  '$ ALT '10 12 12  '$ ALKPHOS  --   --  79  BILITOT 0.8 0.5 1.4*  PROT 6.4 6.7 6.8  ALBUMIN  --   --  3.5   Recent Labs    10/17/21 0843 04/18/22 0850 06/20/22 1529 06/22/22 1557  WBC 7.1 7.7 13.3* 8.3  NEUTROABS 4,196 5,529 10.9*  --   HGB 15.0  15.2 15.2 14.6  HCT 43.3 44.1 45.3 43.1  MCV 96.2 95.2 96.8 96.9  PLT 185 187 175 201   Lab Results  Component Value Date   TSH 4.03 04/18/2022   No results found for: "HGBA1C" Lab Results  Component Value Date   CHOL 130 04/18/2022   HDL 44 04/18/2022   LDLCALC 65 04/18/2022   TRIG 120 04/18/2022   CHOLHDL 3.0 04/18/2022    Significant Diagnostic Results in last 30 days:  No results found.  Assessment/Plan  Essential hypertension Blood pressure is controlled, on Metoprolol, Diovan, Bun/creat 16/1.08 08/23/22  Hyperlipidemia on statin, LDL 65 04/18/22  Hypothyroidism on Levothyroxine, TSH 4.03 04/18/22  Benign prostatic hyperplasia with urinary frequency urinary frequency, on Hytrin, Proscar  Atrial fibrillation (HCC) heart rate is in control, taking Eliquis, Metoprolol. F/u Cardiology  Osteoarthritis of right shoulder R shoulder, X-ray showed DJD, on Tylenol.   GERD (gastroesophageal reflux disease)  stable on Famotidine.   Insomnia secondary to depression with anxiety has been on Lexapro about 2-3 months. 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 Vit B12 465 07/04/22   Family/ staff Communication: plan of care reviewed with the patient and charge nurse.   Labs/tests ordered:  none  Time spend 40 minutes.

## 2022-09-10 NOTE — Assessment & Plan Note (Signed)
stable on Famotidine.

## 2022-09-10 NOTE — Assessment & Plan Note (Signed)
R shoulder, X-ray showed DJD, on Tylenol.  

## 2022-09-10 NOTE — Assessment & Plan Note (Signed)
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 Vit B12 465 07/04/22

## 2022-09-10 NOTE — Assessment & Plan Note (Signed)
Resolved, CT head negative, underwent neurology evaluation

## 2022-09-10 NOTE — Assessment & Plan Note (Signed)
has been on Lexapro about 2-3 months. Na 139 06/22/22

## 2022-09-10 NOTE — Assessment & Plan Note (Signed)
heart rate is in control, taking Eliquis, Metoprolol. F/u Cardiology 

## 2022-09-10 NOTE — Assessment & Plan Note (Signed)
urinary frequency, on Hytrin, Proscar

## 2022-09-10 NOTE — Assessment & Plan Note (Signed)
on Levothyroxine, TSH 4.03 04/18/22

## 2022-09-11 DIAGNOSIS — M6281 Muscle weakness (generalized): Secondary | ICD-10-CM | POA: Diagnosis not present

## 2022-09-11 DIAGNOSIS — R41841 Cognitive communication deficit: Secondary | ICD-10-CM | POA: Diagnosis not present

## 2022-09-11 DIAGNOSIS — R1312 Dysphagia, oropharyngeal phase: Secondary | ICD-10-CM | POA: Diagnosis not present

## 2022-09-11 DIAGNOSIS — F32A Depression, unspecified: Secondary | ICD-10-CM | POA: Diagnosis not present

## 2022-09-11 DIAGNOSIS — R2689 Other abnormalities of gait and mobility: Secondary | ICD-10-CM | POA: Diagnosis not present

## 2022-09-11 DIAGNOSIS — K219 Gastro-esophageal reflux disease without esophagitis: Secondary | ICD-10-CM | POA: Diagnosis not present

## 2022-09-12 DIAGNOSIS — R2689 Other abnormalities of gait and mobility: Secondary | ICD-10-CM | POA: Diagnosis not present

## 2022-09-12 DIAGNOSIS — K219 Gastro-esophageal reflux disease without esophagitis: Secondary | ICD-10-CM | POA: Diagnosis not present

## 2022-09-12 DIAGNOSIS — R1312 Dysphagia, oropharyngeal phase: Secondary | ICD-10-CM | POA: Diagnosis not present

## 2022-09-12 DIAGNOSIS — R41841 Cognitive communication deficit: Secondary | ICD-10-CM | POA: Diagnosis not present

## 2022-09-12 DIAGNOSIS — F32A Depression, unspecified: Secondary | ICD-10-CM | POA: Diagnosis not present

## 2022-09-12 DIAGNOSIS — M6281 Muscle weakness (generalized): Secondary | ICD-10-CM | POA: Diagnosis not present

## 2022-09-13 DIAGNOSIS — R41841 Cognitive communication deficit: Secondary | ICD-10-CM | POA: Diagnosis not present

## 2022-09-13 DIAGNOSIS — R1312 Dysphagia, oropharyngeal phase: Secondary | ICD-10-CM | POA: Diagnosis not present

## 2022-09-13 DIAGNOSIS — M6281 Muscle weakness (generalized): Secondary | ICD-10-CM | POA: Diagnosis not present

## 2022-09-13 DIAGNOSIS — R2689 Other abnormalities of gait and mobility: Secondary | ICD-10-CM | POA: Diagnosis not present

## 2022-09-13 DIAGNOSIS — K219 Gastro-esophageal reflux disease without esophagitis: Secondary | ICD-10-CM | POA: Diagnosis not present

## 2022-09-13 DIAGNOSIS — F32A Depression, unspecified: Secondary | ICD-10-CM | POA: Diagnosis not present

## 2022-09-26 ENCOUNTER — Non-Acute Institutional Stay: Payer: Medicare Other | Admitting: Nurse Practitioner

## 2022-09-26 DIAGNOSIS — K219 Gastro-esophageal reflux disease without esophagitis: Secondary | ICD-10-CM

## 2022-09-26 DIAGNOSIS — E782 Mixed hyperlipidemia: Secondary | ICD-10-CM | POA: Diagnosis not present

## 2022-09-26 DIAGNOSIS — N401 Enlarged prostate with lower urinary tract symptoms: Secondary | ICD-10-CM | POA: Diagnosis not present

## 2022-09-26 DIAGNOSIS — H3530 Unspecified macular degeneration: Secondary | ICD-10-CM | POA: Diagnosis not present

## 2022-09-26 DIAGNOSIS — R441 Visual hallucinations: Secondary | ICD-10-CM

## 2022-09-26 DIAGNOSIS — R269 Unspecified abnormalities of gait and mobility: Secondary | ICD-10-CM

## 2022-09-26 DIAGNOSIS — M19011 Primary osteoarthritis, right shoulder: Secondary | ICD-10-CM | POA: Diagnosis not present

## 2022-09-26 DIAGNOSIS — E039 Hypothyroidism, unspecified: Secondary | ICD-10-CM

## 2022-09-26 DIAGNOSIS — G3184 Mild cognitive impairment, so stated: Secondary | ICD-10-CM

## 2022-09-26 DIAGNOSIS — F32A Depression, unspecified: Secondary | ICD-10-CM

## 2022-09-26 DIAGNOSIS — S0003XA Contusion of scalp, initial encounter: Secondary | ICD-10-CM

## 2022-09-26 DIAGNOSIS — I4811 Longstanding persistent atrial fibrillation: Secondary | ICD-10-CM

## 2022-09-26 DIAGNOSIS — I10 Essential (primary) hypertension: Secondary | ICD-10-CM

## 2022-09-26 DIAGNOSIS — R350 Frequency of micturition: Secondary | ICD-10-CM

## 2022-09-26 NOTE — Assessment & Plan Note (Signed)
heart rate is in control, taking Eliquis, Metoprolol. F/u Cardiology 

## 2022-09-26 NOTE — Assessment & Plan Note (Signed)
on Levothyroxine, TSH 4.03 04/18/22

## 2022-09-26 NOTE — Assessment & Plan Note (Signed)
stable on Famotidine. Hgb 14.8 08/23/22

## 2022-09-26 NOTE — Assessment & Plan Note (Signed)
urinary frequency, on Hytrin, Proscar

## 2022-09-26 NOTE — Assessment & Plan Note (Addendum)
Stable,  has been on Lexapro,  Na 139 06/22/22

## 2022-09-26 NOTE — Assessment & Plan Note (Signed)
resolved,  CT head negative, underwent neurology evaluation 

## 2022-09-26 NOTE — Assessment & Plan Note (Signed)
on statin, LDL 65 04/18/22

## 2022-09-26 NOTE — Assessment & Plan Note (Addendum)
fall, scalp contusion/hematoma superior occiput,  denied HA, dizziness, change of vision, or focal weakness. Continue neuro check, update CBC/diff, CMP/eGFR in am.  

## 2022-09-26 NOTE — Assessment & Plan Note (Signed)
R shoulder, X-ray showed DJD, on Tylenol.  

## 2022-09-26 NOTE — Assessment & Plan Note (Signed)
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 Vit B12 465 07/04/22

## 2022-09-26 NOTE — Assessment & Plan Note (Signed)
recent diagnosed 

## 2022-09-26 NOTE — Progress Notes (Addendum)
Location:   [place] Room Number: 863 Place of Service:  ALF (13) Provider: Lennie Odor Ishani Goldwasser NP  Virgie Dad, MD  Patient Care Team: Virgie Dad, MD as PCP - General (Internal Medicine) Jarome Matin, MD as Consulting Physician (Dermatology) [place], [facility] Wardell Honour, MD as Attending Physician [facility] Medicine)  Extended Emergency Contact Information Primary Emergency Contact: [name],[name] Address: [place], [place] [address] [address] of [place] Phone: [phone] Relation: Daughter Secondary Emergency Contact: [place] of [place] Mobile Phone: [phone] Relation: Daughter  Code Status: DNR Goals of care: Advanced Directive information    07/04/2022    1:24 PM  Advanced Directives  Does Patient Have a Medical Advance Directive? Yes     Chief Complaint  Patient presents with  . Acute Visit    Fall, scalp laceration    HPI:  Pt is a 88 y.o. male seen today for an acute visit for fall, scalp contusion, denied HA, dizziness, change of vision, or focal weakness.   HTN, on Metoprolol, Diovan, Bun/creat 16/1.08 08/23/22             HLD on statin, LDL 65 04/18/22             Hypothyroidism, on Levothyroxine, TSH 4.03 04/18/22             BPH, urinary frequency, on Hytrin, Proscar             Afib, heart rate is in control, taking Eliquis, Metoprolol. F/u Cardiology             OA R shoulder, X-ray showed DJD, on Tylenol.              MD, recent diagnosed             GERD stable on Famotidine. Hgb 14.8 08/23/22             Depression, has been on Lexapro,  Na 139 06/22/22             Visual hallucination, resolved,  CT head negative, underwent neurology evaluation             Gait abnormality, uses walker. Risk of falling due to increased frailty and lack of safety awareness.              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 Vit B12 465  07/04/22   Past Medical History:  Diagnosis Date  . Atrial fibrillation ([place]) 03/26/2011  . Basal cell carcinoma of other specified sites of skin 4/10/012  . Basal cell carcinoma of skin, site unspecified 03/25/2012  . Benign neoplasm of colon 03/25/2012   adenomatous polyps  . Broken arm 11/25/2017  . Colon polyp 2014  . Degeneration of lumbar or lumbosacral intervertebral disc 09/24/2011  . Disturbance of skin sensation 01/28/2012  . Diverticulosis 2014  . Dysphagia, unspecified(787.20) 03/26/2011  . Elevated prostate specific antigen (PSA) 03/26/2011  . Exposure to unspecified radiation 03/26/2011  . Family history of colon cancer 07/16/2013  . GERD (gastroesophageal reflux disease) 07/16/2013  . Hearing deficit 12/21/2013  . Hemangioma of unspecified site 03/26/2011  . Hx of adenomatous colonic polyps 07/16/2013  . Hypertrophy of prostate with urinary obstruction and other lower urinary tract symptoms (LUTS) 03/26/2011  . Ingrowing nail 03/26/2011  . Internal hemorrhoids without mention of complication 0/6/00  . Keratosis, actinic 01/28/2012  .  Long term (current) use of anticoagulants 03/26/2011  . Long term current use of anticoagulant therapy 07/23/2016  . Lumbago 03/26/2011  . 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 Osteoarthrosis, unspecified whether generalized or localized, unspecified site 11/24/2012  . Other and unspecified hyperlipidemia 03/26/2011  . Other malaise and fatigue 03/26/2011  . Pain in joint, forearm 11/24/2012  . Pain in joint, lower leg 06/15/13   left knee  . Paresthesia of left arm 07/05/2014  . Rectal bleed 06/15/13  . Reflux esophagitis 03/26/2011  . Sacroiliitis, not elsewhere classified ([place]) 05/28/2011  . Scoliosis (and kyphoscoliosis), idiopathic 05/28/2011  . 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 Tinnitus 12/21/2013  . Transient ischemic attack (TIA), and cerebral infarction without residual deficits(V12.54)  03/26/2011  . Unspecified essential hypertension 03/26/2011   Past Surgical History:  Procedure Laterality Date  . BASAL CELL CARCINOMA EXCISION Left 12/10/14   Dr. Jarome Matin  . COLONOSCOPY  08/12/2013   Dr. Hilarie Fredrickson adenomatous polyps  . CRYOTHERAPY Left 2009   hand  . PHOTODYNAMIC THERAPY  09/26/14   face and ear lobs  Dr. Jarome Matin  . SQUAMOUS CELL CARCINOMA EXCISION Left 2010   hand    Allergies  Allergen Reactions  . Ciprofloxacin Itching    Patient doesn't like the way it makes him feel  . Lisinopril Itching    unknown  . Sulfa Antibiotics Rash    Allergies as of 09/26/2022       Reactions   Ciprofloxacin Itching   Patient doesn't like the way it makes him feel   Lisinopril Itching   unknown   Sulfa Antibiotics Rash        Medication List        Accurate as of September 26, 2022 11:59 PM. If you have any questions, ask your nurse or doctor.          Eliquis 5 MG Tabs tablet Generic drug: apixaban TAKE 1 TABLET TWICE A DAY FOR ANTICOAGULATION   escitalopram 10 MG tablet Commonly known as: LEXAPRO TAKE 1 TABLET DAILY   famotidine 20 MG tablet Commonly known as: PEPCID Take 20 mg by mouth daily as needed for heartburn or indigestion.   finasteride 5 MG tablet Commonly known as: PROSCAR TAKE 1 TABLET DAILY FOR URINATING DRIBBLING/PROSTATE ENLARGEMENT   levothyroxine 25 MCG tablet Commonly known as: SYNTHROID TAKE 1 TABLET DAILY BEFORE BREAKFAST   metoprolol succinate 50 MG 24 hr tablet Commonly known as: TOPROL-XL TAKE 1 TABLET TWICE A DAY   WITH OR IMMEDIATELY FOLLOWING A MEAL TO CONTROL BLOOD PRESSURE AND HEART RHYTHM   PreserVision AREDS Caps Take 2 capsules by mouth daily.   simvastatin 10 MG tablet Commonly known as: ZOCOR TAKE 1 TABLET DAILY TO LOWER CHOLESTEROL   terazosin 2 MG capsule Commonly known as: HYTRIN TAKE 1 CAPSULE DAILY   valsartan 160 MG tablet Commonly known as: DIOVAN TAKE 1 TABLET DAILY        Review of  Systems  Immunization History  Administered Date(s) Administered  . Influenza Whole 09/15/2012, 09/16/2013  . Influenza, High Dose Seasonal PF 09/24/2017, 09/20/2021  . Influenza,inj,Quad PF,6+ Mos 09/17/2018  . Influenza-Unspecified 09/29/2014, 09/14/2015, 09/26/2016, 09/05/2020  . Moderna Covid-19 Vaccine Bivalent Booster 65yr & up 09/05/2021  . Moderna SARS-COV2 Booster Vaccination 12/31/2019, 05/23/2021  . Moderna Sars-Covid-2 Vaccination 12/20/2019, 01/17/2020  . PPD Test 11/27/2010  . Pneumococcal Conjugate-13 02/08/2016  . Pneumococcal Polysaccharide-23 01/17/1996  . Td 02/14/2006,  09/04/2016  . Zoster Recombinat (Shingrix) 03/31/2018, 06/23/2018   Pertinent  Health Maintenance Due  Topic Date Due  . INFLUENZA VACCINE  07/16/2022  . COLONOSCOPY (Pts 45-50yr Insurance coverage will need to be confirmed)  Discontinued      05/28/2022    2:48 PM 06/05/2022    9:31 AM 06/20/2022    3:15 PM 06/22/2022    3:41 PM 07/04/2022    1:24 PM  [place]in the past year? 0 1   1  Was there an injury with Fall? 0 0   0  Fall Risk Category Calculator 0 1   2  Fall Risk Category Low Low   Moderate  Patient Fall Risk Level  Low fall risk Moderate fall risk Moderate fall risk High fall risk  Patient at Risk for Falls Due to No Fall Risks History of fall(s)     Fall risk Follow up Falls evaluation completed Falls evaluation completed      Functional Status Survey:    Vitals:   09/26/22 1408  BP: (!) 160/78  Pulse: 75  Resp: 16  Temp: 97.8 F (36.6 C)   There is no height or weight on file to calculate BMI. Physical Exam Constitutional:      Appearance: Normal appearance.  HENT:     Head: Normocephalic and atraumatic.     Nose: Nose normal.     Mouth/Throat:     Mouth: Mucous membranes are moist.  Eyes:     Extraocular Movements: Extraocular movements intact.     Conjunctiva/sclera: Conjunctivae normal.     Pupils: Pupils are equal, round, and reactive to light.   Cardiovascular:     Rate and Rhythm: Normal rate. Rhythm irregular.     Heart sounds: No murmur heard. Pulmonary:     Effort: Pulmonary effort is normal.     Breath sounds: No wheezing or rales.  Abdominal:     Palpations: Abdomen is soft.     Tenderness: There is no abdominal tenderness.  Musculoskeletal:        General: Normal range of motion.     Cervical back: Normal range of motion and neck supple.     Right lower leg: No edema.     Left lower leg: No edema.  Skin:    General: Skin is warm and dry.     Findings: Bruising present.     Comments: Hematoma superior occiput, tender when palpated.   Neurological:     General: No focal deficit present.     Mental Status: He is alert and oriented to person, place, and time. Mental status is at baseline.     Gait: Gait abnormal.  Psychiatric:        Mood and Affect: Mood normal.        Behavior: Behavior normal.        Thought Content: Thought content normal.    Labs reviewed: Recent Labs    04/18/22 0850 06/20/22 1529 06/22/22 1557  NA 140 138 139  K 3.8 4.6 4.3  CL 105 104 103  CO2 28 24 27   GLUCOSE 115* 109* 107*  BUN 17 14 17   CREATININE 1.08 1.05 1.18  CALCIUM 9.6 9.2 9.9   Recent Labs    10/17/21 0843 04/18/22 0850 06/20/22 1529  AST 16 18 19   ALT 10 12 12   ALKPHOS  --   --  79  BILITOT 0.8 0.5 1.4*  PROT 6.4 6.7 6.8  ALBUMIN  --   --  3.5   Recent Labs    10/17/21 0843 04/18/22 0850 06/20/22 1529 06/22/22 1557  WBC 7.1 7.7 13.3* 8.3  NEUTROABS 4,196 5,529 10.9*  --   HGB 15.0 15.2 15.2 14.6  HCT 43.3 44.1 45.3 43.1  MCV 96.2 95.2 96.8 96.9  PLT 185 187 175 201   Lab Results  Component Value Date   TSH 4.03 04/18/2022   No results found for: "HGBA1C" Lab Results  Component Value Date   CHOL 130 04/18/2022   HDL 44 04/18/2022   LDLCALC 65 04/18/2022   TRIG 120 04/18/2022   CHOLHDL 3.0 04/18/2022    Significant Diagnostic Results in last 30 days:  No results  found.  Assessment/Plan: Scalp contusion fall, scalp contusion/hematoma superior occiput,  denied HA, dizziness, change of vision, or focal weakness. Continue neuro check, update CBC/diff, CMP/eGFR in am.   Gait abnormality uses walker. Risk of falling due to increased frailty and lack of safety awareness.   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 Vit B12 465 07/04/22    Visual hallucination resolved,  CT head negative, underwent neurology evaluation  Depression Stable,  has been on Lexapro,  Na 139 06/22/22  GERD (gastroesophageal reflux disease)  stable on Famotidine. Hgb 14.8 08/23/22  Macular degeneration recent diagnosed  Osteoarthritis of right shoulder R shoulder, X-ray showed DJD, on Tylenol.   Atrial fibrillation (HCC) heart rate is in control, taking Eliquis, Metoprolol. F/u Cardiology  Benign prostatic hyperplasia with urinary frequency urinary frequency, on Hytrin, Proscar  Hypothyroidism  on Levothyroxine, TSH 4.03 04/18/22  Hyperlipidemia on statin, LDL 65 04/18/22  Essential hypertension Blood pressure is controlled,  on Metoprolol, Diovan, Bun/creat 16/1.08 08/23/22    Family/ staff Communication: plan of care reviewed with the patient and charge nurse.   Labs/tests ordered:  CBC/diff, CMP/eGFR  Time spend 40 minutes.

## 2022-09-26 NOTE — Assessment & Plan Note (Signed)
Blood pressure is controlled,  on Metoprolol, Diovan, Bun/creat 16/1.08 08/23/22

## 2022-09-26 NOTE — Assessment & Plan Note (Signed)
uses walker. Risk of falling due to increased frailty and lack of safety awareness.  

## 2022-09-27 ENCOUNTER — Encounter: Payer: Self-pay | Admitting: Nurse Practitioner

## 2022-10-01 DIAGNOSIS — I10 Essential (primary) hypertension: Secondary | ICD-10-CM | POA: Diagnosis not present

## 2022-10-01 DIAGNOSIS — D649 Anemia, unspecified: Secondary | ICD-10-CM | POA: Diagnosis not present

## 2022-10-15 ENCOUNTER — Other Ambulatory Visit: Payer: Medicare Other

## 2022-10-15 DIAGNOSIS — E039 Hypothyroidism, unspecified: Secondary | ICD-10-CM

## 2022-10-15 DIAGNOSIS — E782 Mixed hyperlipidemia: Secondary | ICD-10-CM

## 2022-10-15 DIAGNOSIS — I10 Essential (primary) hypertension: Secondary | ICD-10-CM | POA: Diagnosis not present

## 2022-10-15 DIAGNOSIS — I480 Paroxysmal atrial fibrillation: Secondary | ICD-10-CM | POA: Diagnosis not present

## 2022-10-15 LAB — COMPLETE METABOLIC PANEL WITH GFR
AG Ratio: 1.3 (calc) (ref 1.0–2.5)
ALT: 7 U/L — ABNORMAL LOW (ref 9–46)
AST: 11 U/L (ref 10–35)
Albumin: 3.7 g/dL (ref 3.6–5.1)
Alkaline phosphatase (APISO): 84 U/L (ref 35–144)
BUN: 17 mg/dL (ref 7–25)
CO2: 29 mmol/L (ref 20–32)
Calcium: 9.1 mg/dL (ref 8.6–10.3)
Chloride: 106 mmol/L (ref 98–110)
Creat: 1.11 mg/dL (ref 0.70–1.22)
Globulin: 2.8 g/dL (calc) (ref 1.9–3.7)
Glucose, Bld: 80 mg/dL (ref 65–99)
Potassium: 4.3 mmol/L (ref 3.5–5.3)
Sodium: 141 mmol/L (ref 135–146)
Total Bilirubin: 0.7 mg/dL (ref 0.2–1.2)
Total Protein: 6.5 g/dL (ref 6.1–8.1)
eGFR: 63 mL/min/{1.73_m2} (ref >=60)

## 2022-10-15 LAB — LIPID PANEL
Cholesterol: 123 mg/dL (ref <200)
HDL: 50 mg/dL (ref >=40)
LDL Cholesterol (Calc): 59 mg/dL (calc)
Non-HDL Cholesterol (Calc): 73 mg/dL (calc) (ref <130)
Total CHOL/HDL Ratio: 2.5 (calc) (ref <5.0)
Triglycerides: 65 mg/dL (ref <150)

## 2022-10-15 LAB — CBC WITH DIFFERENTIAL/PLATELET
Absolute Monocytes: 571 cells/uL (ref 200–950)
Basophils Absolute: 27 cells/uL (ref 0–200)
Basophils Relative: 0.4 %
Eosinophils Absolute: 238 cells/uL (ref 15–500)
Eosinophils Relative: 3.5 %
HCT: 41.4 % (ref 38.5–50.0)
Hemoglobin: 14.3 g/dL (ref 13.2–17.1)
Lymphs Abs: 1095 cells/uL (ref 850–3900)
MCH: 32.8 pg (ref 27.0–33.0)
MCHC: 34.5 g/dL (ref 32.0–36.0)
MCV: 95 fL (ref 80.0–100.0)
MPV: 9.4 fL (ref 7.5–12.5)
Monocytes Relative: 8.4 %
Neutro Abs: 4869 cells/uL (ref 1500–7800)
Neutrophils Relative %: 71.6 %
Platelets: 182 10*3/uL (ref 140–400)
RBC: 4.36 10*6/uL (ref 4.20–5.80)
RDW: 12.7 % (ref 11.0–15.0)
Total Lymphocyte: 16.1 %
WBC: 6.8 10*3/uL (ref 3.8–10.8)

## 2022-10-15 LAB — TSH: TSH: 2.02 mIU/L (ref 0.40–4.50)

## 2022-10-17 ENCOUNTER — Encounter: Payer: Self-pay | Admitting: Nurse Practitioner

## 2022-10-17 ENCOUNTER — Non-Acute Institutional Stay: Payer: Medicare Other | Admitting: Nurse Practitioner

## 2022-10-17 VITALS — BP 128/80 | HR 79 | Temp 98.0°F | Resp 18 | Ht 67.0 in | Wt 192.0 lb

## 2022-10-17 DIAGNOSIS — E782 Mixed hyperlipidemia: Secondary | ICD-10-CM

## 2022-10-17 DIAGNOSIS — R269 Unspecified abnormalities of gait and mobility: Secondary | ICD-10-CM | POA: Diagnosis not present

## 2022-10-17 DIAGNOSIS — F32A Depression, unspecified: Secondary | ICD-10-CM | POA: Diagnosis not present

## 2022-10-17 DIAGNOSIS — M19011 Primary osteoarthritis, right shoulder: Secondary | ICD-10-CM | POA: Diagnosis not present

## 2022-10-17 DIAGNOSIS — N401 Enlarged prostate with lower urinary tract symptoms: Secondary | ICD-10-CM | POA: Diagnosis not present

## 2022-10-17 DIAGNOSIS — R350 Frequency of micturition: Secondary | ICD-10-CM

## 2022-10-17 DIAGNOSIS — K219 Gastro-esophageal reflux disease without esophagitis: Secondary | ICD-10-CM | POA: Diagnosis not present

## 2022-10-17 DIAGNOSIS — H3530 Unspecified macular degeneration: Secondary | ICD-10-CM

## 2022-10-17 DIAGNOSIS — I10 Essential (primary) hypertension: Secondary | ICD-10-CM

## 2022-10-17 DIAGNOSIS — E039 Hypothyroidism, unspecified: Secondary | ICD-10-CM | POA: Diagnosis not present

## 2022-10-17 DIAGNOSIS — R41841 Cognitive communication deficit: Secondary | ICD-10-CM | POA: Diagnosis not present

## 2022-10-17 DIAGNOSIS — R441 Visual hallucinations: Secondary | ICD-10-CM

## 2022-10-17 DIAGNOSIS — I4811 Longstanding persistent atrial fibrillation: Secondary | ICD-10-CM | POA: Diagnosis not present

## 2022-10-17 DIAGNOSIS — G3184 Mild cognitive impairment, so stated: Secondary | ICD-10-CM

## 2022-10-17 DIAGNOSIS — R1312 Dysphagia, oropharyngeal phase: Secondary | ICD-10-CM | POA: Diagnosis not present

## 2022-10-17 NOTE — Assessment & Plan Note (Signed)
R shoulder, X-ray showed DJD, on Tylenol.

## 2022-10-17 NOTE — Assessment & Plan Note (Signed)
stable on Famotidine. Hgb 14.3 10/15/22 

## 2022-10-17 NOTE — Assessment & Plan Note (Signed)
Blood pressure is controlled, on Metoprolol, Valsartan,  Bun/creat 17/1.11 10/15/22 

## 2022-10-17 NOTE — Assessment & Plan Note (Signed)
uses walker. Risk of falling due to increased frailty and lack of safety awareness.

## 2022-10-17 NOTE — Assessment & Plan Note (Signed)
on Simvastatin, LDL 59 10/15/22 

## 2022-10-17 NOTE — Assessment & Plan Note (Signed)
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 Vit B12 465 07/04/22, functioning well in [facility]

## 2022-10-17 NOTE — Assessment & Plan Note (Signed)
heart rate is in control, taking Eliquis, Metoprolol. F/u Cardiology 

## 2022-10-17 NOTE — Assessment & Plan Note (Addendum)
urinary frequency, incontinence is more frequently, saw Urology in the past, uses adult depends, denied dysuria, abd pain, or blood in urine, on Terazosin, Proscar

## 2022-10-17 NOTE — Assessment & Plan Note (Addendum)
His mood is stable, will taper off Lexapro qod x 2weeks, then dc.

## 2022-10-17 NOTE — Assessment & Plan Note (Signed)
resolved,  CT head negative, underwent neurology evaluation

## 2022-10-17 NOTE — Progress Notes (Signed)
Location:   [facility]   Place of Service:   [facility] Provider: Marlana Latus NP  Code Status: DNR Goals of Care: [place]    07/04/2022    1:24 PM  Advanced Directives  Does Patient Have a Medical Advance Directive? Yes     Chief Complaint  Patient presents with   Medical Management of Chronic Issues    HPI: Patient is a 88 y.o. male seen today for medical management of chronic diseases.    HTN, on Metoprolol, Valsartan,  Bun/creat 17/1.11 10/15/22             HLD on Simvastatin, LDL 59 10/15/22             Hypothyroidism, on Levothyroxine, TSH 2.02 10/15/22             BPH, urinary frequency, on Terazosin, Proscar             Afib, heart rate is in control, taking Eliquis, Metoprolol. F/u Cardiology             OA R shoulder, X-ray showed DJD, on Tylenol.              MD, recent diagnosed             GERD stable on Famotidine. Hgb 14.3 10/15/22             Depression, has been on Lexapro             Visual hallucination, resolved,  CT head negative, underwent neurology evaluation             Gait abnormality, uses walker. Risk of falling due to increased frailty and lack of safety awareness.              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 Vit B12 465 07/04/22    Past Medical History:  Diagnosis Date   Atrial fibrillation ([place]) 03/26/2011   Basal cell carcinoma of other specified sites of skin 4/10/012   Basal cell carcinoma of skin, site unspecified 03/25/2012   Benign neoplasm of colon 03/25/2012   adenomatous polyps   Broken arm 11/25/2017   Colon polyp 2014   Degeneration of lumbar or lumbosacral intervertebral disc 09/24/2011   Disturbance of skin sensation 01/28/2012   Diverticulosis 2014   Dysphagia, unspecified(787.20) 03/26/2011   Elevated prostate specific antigen (PSA) 03/26/2011   Exposure to unspecified radiation 03/26/2011   Family history of colon cancer 07/16/2013   GERD (gastroesophageal reflux  disease) 07/16/2013   Hearing deficit 12/21/2013   Hemangioma of unspecified site 03/26/2011   Hx of adenomatous colonic polyps 07/16/2013   Hypertrophy of prostate with urinary obstruction and other lower urinary tract symptoms (LUTS) 03/26/2011   Ingrowing nail 03/26/2011   Internal hemorrhoids without mention of complication 08/17/41   Keratosis, actinic 01/28/2012   Long term (current) use of anticoagulants 03/26/2011   Long term current use of anticoagulant therapy 07/23/2016   Lumbago 03/26/2011   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   Osteoarthrosis, unspecified whether generalized or localized, unspecified site 11/24/2012   Other and unspecified hyperlipidemia 03/26/2011   Other malaise and fatigue 03/26/2011   Pain in joint, forearm 11/24/2012   Pain in joint, lower leg 06/15/13   left knee   Paresthesia of left arm 07/05/2014   Rectal bleed 06/15/13   Reflux esophagitis 03/26/2011   Sacroiliitis, not elsewhere classified ([place]) 05/28/2011   Scoliosis (and  kyphoscoliosis), idiopathic 05/28/2011   Squamous cell carcinoma of skin of upper limb, including shoulder 03/26/2011   Tinnitus 12/21/2013   Transient ischemic attack (TIA), and cerebral infarction without residual deficits(V12.54) 03/26/2011   Unspecified essential hypertension 03/26/2011      Past Medical History:  Diagnosis Date   Atrial fibrillation ([place]) 03/26/2011   Basal cell carcinoma of other specified sites of skin 4/10/012   Basal cell carcinoma of skin, site unspecified 03/25/2012   Benign neoplasm of colon 03/25/2012   adenomatous polyps   Broken arm 11/25/2017   Colon polyp 2014   Degeneration of lumbar or lumbosacral intervertebral disc 09/24/2011   Disturbance of skin sensation 01/28/2012   Diverticulosis 2014   Dysphagia, unspecified(787.20) 03/26/2011   Elevated prostate specific antigen (PSA) 03/26/2011   Exposure to unspecified radiation 03/26/2011   Family history of colon cancer 07/16/2013   GERD  (gastroesophageal reflux disease) 07/16/2013   Hearing deficit 12/21/2013   Hemangioma of unspecified site 03/26/2011   Hx of adenomatous colonic polyps 07/16/2013   Hypertrophy of prostate with urinary obstruction and other lower urinary tract symptoms (LUTS) 03/26/2011   Ingrowing nail 03/26/2011   Internal hemorrhoids without mention of complication 07/17/43   Keratosis, actinic 01/28/2012   Long term (current) use of anticoagulants 03/26/2011   Long term current use of anticoagulant therapy 07/23/2016   Lumbago 03/26/2011   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   Osteoarthrosis, unspecified whether generalized or localized, unspecified site 11/24/2012   Other and unspecified hyperlipidemia 03/26/2011   Other malaise and fatigue 03/26/2011   Pain in joint, forearm 11/24/2012   Pain in joint, lower leg 06/15/13   left knee   Paresthesia of left arm 07/05/2014   Rectal bleed 06/15/13   Reflux esophagitis 03/26/2011   Sacroiliitis, not elsewhere classified ([place]) 05/28/2011   Scoliosis (and kyphoscoliosis), idiopathic 05/28/2011   Squamous cell carcinoma of skin of upper limb, including shoulder 03/26/2011   Tinnitus 12/21/2013   Transient ischemic attack (TIA), 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 Jarome Matin   COLONOSCOPY  08/12/2013   Dr. Hilarie Fredrickson adenomatous polyps   CRYOTHERAPY Left 2009   hand   PHOTODYNAMIC THERAPY  09/26/14   face and ear lobs  Dr. Jarome Matin   SQUAMOUS CELL CARCINOMA EXCISION Left 2010   hand    Allergies  Allergen Reactions   Ciprofloxacin Itching    Patient doesn't like the way it makes him feel   Lisinopril Itching    unknown   Sulfa Antibiotics Rash    Allergies as of 10/17/2022       Reactions   Ciprofloxacin Itching   Patient doesn't like the way it makes him  feel   Lisinopril Itching   unknown   Sulfa Antibiotics Rash        Medication List        Accurate as of October 17, 2022  3:25 PM. If you have any questions, ask your nurse or doctor.          Eliquis 5 MG Tabs tablet Generic drug: apixaban TAKE 1 TABLET TWICE A DAY FOR ANTICOAGULATION   escitalopram 10 MG tablet Commonly known as: LEXAPRO TAKE 1 TABLET DAILY   famotidine 20 MG tablet Commonly known as: PEPCID Take 20 mg by mouth daily as needed for heartburn or indigestion.  finasteride 5 MG tablet Commonly known as: PROSCAR TAKE 1 TABLET DAILY FOR URINATING DRIBBLING/PROSTATE ENLARGEMENT   levothyroxine 25 MCG tablet Commonly known as: SYNTHROID TAKE 1 TABLET DAILY BEFORE BREAKFAST   metoprolol succinate 50 MG 24 hr tablet Commonly known as: TOPROL-XL TAKE 1 TABLET TWICE A DAY   WITH OR IMMEDIATELY FOLLOWING A MEAL TO CONTROL BLOOD PRESSURE AND HEART RHYTHM   PreserVision AREDS Caps Take 2 capsules by mouth daily.   simvastatin 10 MG tablet Commonly known as: ZOCOR TAKE 1 TABLET DAILY TO LOWER CHOLESTEROL   terazosin 2 MG capsule Commonly known as: HYTRIN TAKE 1 CAPSULE DAILY   valsartan 160 MG tablet Commonly known as: DIOVAN TAKE 1 TABLET DAILY        Review of Systems:  Review of Systems  Constitutional:  Negative for appetite change, fatigue and fever.  HENT:  Positive for hearing loss. Negative for congestion and trouble swallowing.   Eyes:  Negative for visual disturbance.  Respiratory:  Negative for cough and shortness of breath.   Gastrointestinal:  Negative for abdominal pain and constipation.  Genitourinary:  Positive for frequency. Negative for dysuria and urgency.  Musculoskeletal:  Positive for arthralgias and gait problem.  Skin:  Negative for color change.  Psychiatric/Behavioral:  Negative for confusion, dysphoric mood, hallucinations and sleep disturbance.        Feels better    Health Maintenance  Topic Date Due    INFLUENZA VACCINE  07/16/2022   COVID-19 Vaccine (4 - Moderna risk series) 11/13/2022 (Originally 10/31/2021)   Medicare Annual Wellness (AWV)  03/26/2023   TETANUS/TDAP  09/04/2026   Pneumonia Vaccine 90+ Years old  Completed   Zoster Vaccines- Shingrix  Completed   HPV VACCINES  Aged Out   COLONOSCOPY (Pts 45-74yr Insurance coverage will need to be confirmed)  Discontinued    Physical Exam: Vitals:   10/17/22 1423  BP: 128/80  Pulse: 79  Resp: 18  Temp: 98 F (36.7 C)  SpO2: 96%  Weight: 192 lb (87.1 kg)  Height: '5\' 7"'$  (1.702 m)   Body mass index is 30.07 kg/m. Physical Exam Constitutional:      Appearance: Normal appearance.  HENT:     Head: Normocephalic and atraumatic.     Nose: Nose normal.     Mouth/Throat:     Mouth: Mucous membranes are moist.  Eyes:     Extraocular Movements: Extraocular movements intact.     Conjunctiva/sclera: Conjunctivae normal.     Pupils: Pupils are equal, round, and reactive to light.  Cardiovascular:     Rate and Rhythm: Normal rate. Rhythm irregular.     Heart sounds: No murmur heard. Pulmonary:     Effort: Pulmonary effort is normal.     Breath sounds: No wheezing or rales.  Abdominal:     Palpations: Abdomen is soft.     Tenderness: There is no abdominal tenderness.  Musculoskeletal:        General: Normal range of motion.     Cervical back: Normal range of motion and neck supple.     Right lower leg: No edema.     Left lower leg: No edema.  Skin:    General: Skin is warm and dry.  Neurological:     General: No focal deficit present.     Mental Status: He is alert and oriented to person, place, and time. Mental status is at baseline.     Gait: Gait abnormal.  Psychiatric:  Mood and Affect: Mood normal.        Behavior: Behavior normal.        Thought Content: Thought content normal.     Labs reviewed: Basic Metabolic Panel: Recent Labs    04/18/22 0850 06/20/22 1529 06/22/22 1557 10/15/22 0730  NA 140  138 139 141  K 3.8 4.6 4.3 4.3  CL 105 104 103 106  CO2 '28 24 27 29  '$ GLUCOSE 115* 109* 107* 80  BUN '17 14 17 17  '$ CREATININE 1.08 1.05 1.18 1.11  CALCIUM 9.6 9.2 9.9 9.1  TSH 4.03  --   --  2.02   Liver Function Tests: Recent Labs    04/18/22 0850 06/20/22 1529 10/15/22 0730  AST '18 19 11  '$ ALT 12 12 7*  ALKPHOS  --  79  --   BILITOT 0.5 1.4* 0.7  PROT 6.7 6.8 6.5  ALBUMIN  --  3.5  --    Recent Labs    06/20/22 1529  LIPASE 33   No results for input(s): "AMMONIA" in the last 8760 hours. CBC: Recent Labs    04/18/22 0850 06/20/22 1529 06/22/22 1557 10/15/22 0730  WBC 7.7 13.3* 8.3 6.8  NEUTROABS 5,529 10.9*  --  4,869  HGB 15.2 15.2 14.6 14.3  HCT 44.1 45.3 43.1 41.4  MCV 95.2 96.8 96.9 95.0  PLT 187 175 201 182   Lipid Panel: Recent Labs    04/18/22 0850 10/15/22 0730  CHOL 130 123  HDL 44 50  LDLCALC 65 59  TRIG 120 65  CHOLHDL 3.0 2.5   No results found for: "HGBA1C"  Procedures since last visit: No results found.  Assessment/Plan  Hypothyroidism on Levothyroxine, TSH 2.02 10/15/22  Hyperlipidemia on Simvastatin, LDL 59 10/15/22  Essential hypertension Blood pressure is controlled, on Metoprolol, Valsartan,  Bun/creat 17/1.11 10/15/22  Benign prostatic hyperplasia with urinary frequency urinary frequency, incontinence is more frequently, saw Urology in the past, uses adult depends, denied dysuria, abd pain, or blood in urine, on Terazosin, Proscar  Atrial fibrillation ([place]) heart rate is in control, taking Eliquis, Metoprolol. F/u Cardiology  Osteoarthritis of right shoulder R shoulder, X-ray showed DJD, on Tylenol.   Macular degeneration recent diagnosed  GERD (gastroesophageal reflux disease) stable on Famotidine. Hgb 14.3 10/15/22  Depression His mood is stable, will taper off Lexapro qod x 2weeks, then dc.   Visual hallucination  resolved,  CT head negative, underwent neurology evaluation  Gait abnormality uses walker.  Risk of falling due to increased frailty and lack of safety awareness.   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 Vit B12 465 07/04/22, functioning well in [facility]   Labs/tests ordered:  none  Next appt:  3 months.

## 2022-10-17 NOTE — Assessment & Plan Note (Signed)
on Levothyroxine, TSH 2.02 10/15/22 

## 2022-10-17 NOTE — Assessment & Plan Note (Signed)
recent diagnosed

## 2022-10-18 ENCOUNTER — Telehealth: Payer: Self-pay

## 2022-10-18 NOTE — Telephone Encounter (Signed)
Patient's daughter called and stated that patient saw [name], [name], NP on 10/17/22 and was told that he needed to follow up with with urologist and had seen one in the past. I looked in the chart and did not see one. Could you please place referral for urologist and include daughter's contact information to coordinate appointment.   DAUGHTER"S INFO Arbie Cookey [phone]  Message routed to [name] [name], NP

## 2022-10-21 ENCOUNTER — Ambulatory Visit (INDEPENDENT_AMBULATORY_CARE_PROVIDER_SITE_OTHER): Payer: Medicare Other | Admitting: Physician Assistant

## 2022-10-21 ENCOUNTER — Encounter: Payer: Self-pay | Admitting: Physician Assistant

## 2022-10-21 VITALS — BP 167/95 | HR 94 | Resp 18 | Ht 67.0 in | Wt 191.0 lb

## 2022-10-21 DIAGNOSIS — G3184 Mild cognitive impairment, so stated: Secondary | ICD-10-CM | POA: Diagnosis not present

## 2022-10-21 NOTE — Progress Notes (Signed)
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 Patient is not on antidementia medications as risks of the medicine outweigh the benefits.  From the cognitive standpoint, the patient is stable.     Recommendations:   Follow up in 6 months. Continue ST and PT Continue to control mood as per PCP,  recommend to continue Lexapro 10 mg daily for situational depression Recommend good control of cardiovascular risk factors.       Subjective:   This patient is accompanied in the office by his daughter and other daughter on the phone who supplements the history. Previous records as well as any outside records available were reviewed prior to todays visit.  Last seen on 07/04/22.    Any changes in memory since last visit?  "Every name, he reports that he forgets something, but he will retrieve the words that he needs to express ".  He denies any issues with conversation.  "Vocabulary wise, he is okay "he has not been doing any crossword puzzles or word finding.  He now lives in an adult living facility, and is trying to adjust "is not very happy about being there ".  He does not participate in many of the activities, but he does attend all of his meals there. repeats oneself?  "He forgot a couple of appointments, present as he is reminded, he is okay ".-Daughter says.   Disoriented when walking into a room?  Overall, denies being disoriented but walking into a room. Leaving objects in unusual places?  Patient denies   Ambulates  with difficulty?  Uses a walker for stability due to OA.  He is doing physical therapy for strength and stability  recent falls?  Patient denies   Any head injuries?  Patient denies   History of seizures?   Patient denies   Wandering behavior?  Patient denies   Patient drives?  He no longer drives Any mood changes since last visit?  He may have some situational depression, the patient is trying to adjust to her new environment at the ALF.  He is on Lexapro, this is being tapered her off, his daughter is monitoring to see if he needs to be re-placed on it.   Hallucinations?  The patient has vision difficulties, and may be seeing some shadows floating, his daughter is monitoring it does not appear to be true hallucination.   Paranoia?  Patient denies   Patient reports that sleeps well without vivid dreams, REM behavior or sleepwalking   History of sleep apnea?  Patient denies   Any hygiene concerns?  Patient denies   Independent of bathing and dressing? He needs assistance due to mobility  Does the patient needs help with medications?  Facility provides the medications  who is in charge of the finances?  Family  is in charge    Any changes in appetite?  Patient denies "I eat because I have to eat " Patient have trouble swallowing? Patient denies   Does the patient cook?  Patient denies   Any kitchen accidents such as leaving the stove on? Patient denies   Any headaches?  Patient denies   Double vision? Patient denies  Any focal numbness or tingling?  Patient denies   Chronic back pain Patient denies   Unilateral weakness?  Patient denies   Any tremors?  Patient denies   Any history of anosmia?  Patient denies   Any incontinence of urine?  Endorsed, uses pad  has a  history of BPH . No recent UTI, following with urology soon    Any bowel dysfunction?   Patient denies      Patient lives  in ALF at [facility]   Initial evaluation 06/2022  How long did patient have memory difficulties?  Initially the patient denied.  Then on questioning, he stated that "perhaps this has been going on for a year ".  On  further questioning, his daughters report that indeed, may be for a couple of years, he has been in cognitive decline, especially with remembering words and short-term memory especially over the last 2 months.  Long-term memory is good.  Patient admits that has been hard for him talking about Alzheimer's, sees wife is in a memory care facility with Alzheimer's disease; his daughters suspect that he may have been minimizing the symptoms because of this.. Patient lives in [place].  Until hospitalization for AMS, the patient was leaning in independent living facility, but after the hospitalization he was moved to ALF. Repeats oneself? Denies  Disoriented when walking into a room?  Patient denies   Leaving objects in unusual places?  Patient denies   Ambulates  with difficulty?   Patient denies   Recent falls?  He had a recent fall after missing the toilet seat, and hitting his back.  Any head injuries?  Patient denies   History of seizures?  He is not aware.  He has a daughter with a history of seizures.  However, he had an episode of bedwetting on 06/30/2022, which is of concern.  He also had episodes of "hearing music ".  He denies any other symptoms such as bloating his lips or tongue, muscle aches after waking up, body jerks etc. Wandering behavior?  Patient denies   Patient drives?   Patient no longer drives due to macular degeneration  Any mood changes such irritability agitation?  Patient denies   Any history of depression?:  His wife lives in a memory care facility for the last year.  He has been more depressed over the last 2 months given his recent health issues and his wife being in a different place.  He was started on Lexapro 2 weeks ago by PCP.  Hallucinations?  He is daughters report that he was in his usual state of health, until he experienced UTI on 06/14/2022.  At that point, he was prescribed cephalexin, and a week later, he began to have hallucinations.  Of note, the patient drinks minimal  amount of water, and was apparently dehydrated.  On 06/23/2022, he still experience hallucinations, and his eyes were closed.  Symptoms began to resolve as of 06/24/2022.  Throughout this time, he continued to experience visual distortion, such as shape shifting, and hearing music or hearing and air conditioner when it was not there.  As of 06/26/2022, he began to be symptom-free, however on 06/30/2022, he had an overnight urine event as described by his daughters.  No hallucination over the last couple of days.   Patient reports that he sleeps well without vivid dreams, REM behavior or sleepwalking    History of sleep apnea?  Patient denies   Any hygiene concerns?  Patient denies   Independent  of bathing and dressing?  Endorsed  Does the patient needs help with medications?  ALF monitors Who is in charge of the finances?  Daughter is that in charge. Any changes in appetite?  Patient denies   Patient have trouble swallowing? Patient denies   Does the patient cook?  Patient denies   Any kitchen accidents such as leaving the stove on? Patient denies   Any headaches?  Patient denies   Double vision? Patient denies   Any focal numbness or tingling?  Patient denies   Chronic back pain Patient denies   Unilateral weakness?  Patient denies   Any tremors?  Patient denies   Any history of anosmia?  Patient denies   Any incontinence of urine?  Endorsed, he does not wear diapers.  He had 1 "accident", on 06/30/2022.  Recent UTI.  He also has a history of BPH with prior urine retention. Any bowel dysfunction?  Sometimes he experiences diarrhea History of heavy alcohol intake?  Patient denies   History of heavy tobacco use?  Patient denies   Family history of dementia?  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[employer])  Personally reviewed CT head  7/6/203 remarkable for chronic microvascular disease and cerebral atrophy without acute findings  Pertinent labs  TSH 4.03, B12 465  Past  Medical History:  Diagnosis Date   Atrial fibrillation ([place]) 03/26/2011   Basal cell carcinoma of other specified sites of skin 4/10/012   Basal cell carcinoma of skin, site unspecified 03/25/2012   Benign neoplasm of colon 03/25/2012   adenomatous polyps   Broken arm 11/25/2017   Colon polyp 2014   Degeneration of lumbar or lumbosacral intervertebral disc 09/24/2011   Disturbance of skin sensation 01/28/2012   Diverticulosis 2014   Dysphagia, unspecified(787.20) 03/26/2011   Elevated prostate specific antigen (PSA) 03/26/2011   Exposure to unspecified radiation 03/26/2011   Family history of colon cancer 07/16/2013   GERD (gastroesophageal reflux disease) 07/16/2013   Hearing deficit 12/21/2013   Hemangioma of unspecified site 03/26/2011   Hx of adenomatous colonic polyps 07/16/2013   Hypertrophy of prostate with urinary obstruction and other lower urinary tract symptoms (LUTS) 03/26/2011   Ingrowing nail 03/26/2011   Internal hemorrhoids without mention of complication 07/16/81   Keratosis, actinic 01/28/2012   Long term (current) use of anticoagulants 03/26/2011   Long term current use of anticoagulant therapy 07/23/2016   Lumbago 03/26/2011   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   Osteoarthrosis, unspecified whether generalized or localized, unspecified site 11/24/2012   Other and unspecified hyperlipidemia 03/26/2011   Other malaise and fatigue 03/26/2011   Pain in joint, forearm 11/24/2012   Pain in joint, lower leg 06/15/13   left knee   Paresthesia of left arm 07/05/2014   Rectal bleed 06/15/13   Reflux esophagitis 03/26/2011   Sacroiliitis, not elsewhere classified ([place]) 05/28/2011   Scoliosis (and kyphoscoliosis), idiopathic 05/28/2011   Squamous cell carcinoma of skin of upper limb, including shoulder 03/26/2011   Tinnitus 12/21/2013   Transient ischemic attack (TIA), and cerebral infarction without residual deficits(V12.54) 03/26/2011   Unspecified essential hypertension  03/26/2011     Past Surgical History:  Procedure Laterality Date   BASAL CELL CARCINOMA EXCISION Left 12/10/14   Dr. Jarome Matin   COLONOSCOPY  08/12/2013   Dr. Hilarie Fredrickson adenomatous polyps   CRYOTHERAPY Left 2009   hand   PHOTODYNAMIC THERAPY  09/26/14   face and ear lobs  Dr. Jarome Matin  SQUAMOUS CELL CARCINOMA EXCISION Left 2010   hand     PREVIOUS MEDICATIONS:   CURRENT MEDICATIONS:  Outpatient Encounter Medications as of 10/21/2022  Medication Sig   ELIQUIS 5 MG TABS tablet TAKE 1 TABLET TWICE A DAY FOR ANTICOAGULATION   escitalopram (LEXAPRO) 10 MG tablet TAKE 1 TABLET DAILY   famotidine (PEPCID) 20 MG tablet Take 20 mg by mouth daily as needed for heartburn or indigestion.   finasteride (PROSCAR) 5 MG tablet TAKE 1 TABLET DAILY FOR URINATING DRIBBLING/PROSTATE ENLARGEMENT   levothyroxine (SYNTHROID) 25 MCG tablet TAKE 1 TABLET DAILY BEFORE BREAKFAST   metoprolol succinate (TOPROL-XL) 50 MG 24 hr tablet TAKE 1 TABLET TWICE A DAY   WITH OR IMMEDIATELY FOLLOWING A MEAL TO CONTROL BLOOD PRESSURE AND HEART RHYTHM   Multiple Vitamins-Minerals (PRESERVISION AREDS) CAPS Take 2 capsules by mouth daily.   simvastatin (ZOCOR) 10 MG tablet TAKE 1 TABLET DAILY TO LOWER CHOLESTEROL   terazosin (HYTRIN) 2 MG capsule TAKE 1 CAPSULE DAILY   valsartan (DIOVAN) 160 MG tablet TAKE 1 TABLET DAILY   No facility-administered encounter medications on file as of 10/21/2022.     Objective:     PHYSICAL EXAMINATION:    VITALS:   Vitals:   10/21/22 0911  BP: (!) 167/95  Pulse: 94  Resp: 18  SpO2: 99%  Weight: 191 lb (86.6 kg)  Height: '5\' 7"'$  (1.702 m)    GEN:  The patient appears stated age and is in NAD. HEENT:  Normocephalic, atraumatic.   Neurological examination:  General: NAD, well-groomed, appears stated age. Orientation: The patient is alert. Oriented to person, place and date Cranial nerves: There is good facial symmetry.The speech is fluent and clear. No aphasia or dysarthria.  Fund of knowledge is appropriate. Recent memory impaired and remote memory is normal.  Attention and concentration are normal.  Able to name objects and repeat phrases.  Hearing is intact to conversational tone.    Sensation: Sensation is intact to light touch throughout Motor: Strength is at least antigravity x4. Tremors: none  DTR's 2/4 in UE/LE       No data to display             07/04/2022    3:00 PM 03/25/2022    3:23 PM 01/28/2018   11:36 AM  MMSE - Mini Mental State Exam  Orientation to time '5 5 4  '$ Orientation to Place '5 5 5  '$ Registration '3 3 3  '$ Attention/ Calculation '4 3 5  '$ Recall 1 0 3  Language- name 2 objects '2 2 2  '$ Language- repeat '1 1 1  '$ Language- follow 3 step command '3 3 3  '$ Language- read & follow direction '1 1 1  '$ Write a sentence '1 1 1  '$ Copy design 1 0 1  Total score '27 24 29       '$ Movement examination: Tone: There is normal tone in the UE/LE Abnormal movements:  no tremor.  No myoclonus.  No asterixis.   Coordination:  There is no decremation with RAM's. Normal finger to nose  Gait and Station: The patient has no difficulty arising out of a deep-seated chair without the use of the hands. The patient's stride length is good.  Gait is cautious and narrow.  Uses a walker to ambulate for stability.  Thank you for allowing [place] the opportunity to participate in the care of this nice patient. Please do not hesitate to contact us for any questions or concerns.   Total time spent on  today's visit was 31 minutes dedicated to this patient today, preparing to see patient, examining the patient, ordering tests and/or medications and counseling the patient, documenting clinical information in the EHR or other health record, independently interpreting results and communicating results to the patient/family, discussing treatment and goals, answering patient's questions and coordinating care.  Cc:  [name], [name], NP  Sharene Butters 10/21/2022 9:29 AM

## 2022-10-21 NOTE — Patient Instructions (Signed)
It was a pleasure to see you today at our office.   Recommendations:  Follow up 6 months Make sure to have PCP control the mood issues Make sure to drink water  Recommend increasing activity with PT, ST    Whom to call:  Memory  decline, memory medications: Call our office [phone]   For psychiatric meds, mood meds: Please have your primary care physician manage these medications.   Counseling regarding caregiver distress, including caregiver depression, anxiety and issues regarding community resources, adult day care programs, adult living facilities, or memory care questions:   Feel free to contact [place], Social Worker at [phone]   For assessment of decision of mental capacity and competency:  Call Dr. Anthoney Harada, geriatric psychiatrist at [phone]  For guidance in geriatric dementia issues please call [facility] [phone]  For guidance regarding [facility] and if placement were needed at the facility, contact Arnell Asal, Social Worker tel: [phone]  If you have any severe symptoms of a stroke, or other severe issues such as confusion,severe chills or fever, etc call 911 or go to the ER as you may need to be evaluated further   Feel free to visit Facebook page " [email or web address]" for tips of how to care for people with memory problems.   Feel free to go to the following database for funded clinical studies conducted around the world: http://saunders.com/   https://www.triadclinicaltrials.com/     RECOMMENDATIONS FOR ALL PATIENTS WITH MEMORY PROBLEMS: 1. Continue to exercise (Recommend 30 minutes of walking everyday, or 3 hours every week) 2. Increase social interactions - continue going to [place] and enjoy social gatherings with friends and family 3. Eat healthy, avoid fried foods and eat more fruits and vegetables 4. Maintain adequate blood pressure, blood sugar, and blood cholesterol level. Reducing  the risk of stroke and cardiovascular disease also helps promoting better memory. 5. Avoid stressful situations. Live a simple life and avoid aggravations. Organize your time and prepare for the next day in anticipation. 6. Sleep well, avoid any interruptions of sleep and avoid any distractions in the bedroom that may interfere with adequate sleep quality 7. Avoid sugar, avoid sweets as there is a strong link between excessive sugar intake, diabetes, and cognitive impairment We discussed the Mediterranean diet, which has been shown to help patients reduce the risk of progressive memory disorders and reduces cardiovascular risk. This includes eating fish, eat fruits and green leafy vegetables, nuts like almonds and hazelnuts, walnuts, and also use olive oil. Avoid fast foods and fried foods as much as possible. Avoid sweets and sugar as sugar use has been linked to worsening of memory function.  There is always a concern of gradual progression of memory problems. If this is the case, then we may need to adjust level of care according to patient needs. Support, both to the patient and caregiver, should then be put into place.    The Alzheimer[employer] Association is here all day, every day for people facing Alzheimer's disease through our free 24/7 Helpline: [phone]. The Helpline provides reliable information and support to all those who need assistance, such as individuals living with memory loss, Alzheimer's or other dementia, caregivers, health care professionals and the public.  Our highly trained and knowledgeable staff can help you with: Understanding memory loss, dementia and Alzheimer's  Medications and other treatment options  General information about aging and brain health  Skills to provide quality care and to find the best care from  Training and development officer, financial and living-arrangement decisions Our Helpline also features: Confidential care consultation provided by master's level  clinicians who can help with decision-making support, crisis assistance and education on issues families face every day  Help in a caller's preferred language using our translation service that features more than 200 languages and dialects  Referrals to local community programs, services and ongoing support     FALL PRECAUTIONS: Be cautious when walking. Scan the area for obstacles that may increase the risk of trips and falls. When getting up in the mornings, sit up at the edge of the bed for a few minutes before getting out of bed. Consider elevating the bed at the head end to avoid drop of blood pressure when getting up. Walk always in a well-lit room (use night lights in the walls). Avoid area rugs or power cords from appliances in the middle of the walkways. Use a walker or a cane if necessary and consider physical therapy for balance exercise. Get your eyesight checked regularly.  FINANCIAL OVERSIGHT: Supervision, especially oversight when making financial decisions or transactions is also recommended.  HOME SAFETY: Consider the safety of the kitchen when operating appliances like stoves, microwave oven, and blender. Consider having supervision and share cooking responsibilities until no longer able to participate in those. Accidents with firearms and other hazards in the house should be identified and addressed as well.   ABILITY TO BE LEFT ALONE: If patient is unable to contact 911 operator, consider using LifeLine, or when the need is there, arrange for someone to stay with patients. Smoking is a fire hazard, consider supervision or cessation. Risk of wandering should be assessed by caregiver and if detected at any point, supervision and safe proof recommendations should be instituted.  MEDICATION SUPERVISION: Inability to self-administer medication needs to be constantly addressed. Implement a mechanism to ensure safe administration of the medications.   DRIVING: Regarding driving, in  patients with progressive memory problems, driving will be impaired. We advise to have someone else do the driving if trouble finding directions or if minor accidents are reported. Independent driving assessment is available to determine safety of driving.   If you are interested in the driving assessment, you can contact the following:  [employer] in [place]  [place] [id number] or [phone]      [place] refers to food and lifestyle choices that are based on the traditions of countries located on the [employer]. This way of eating has been shown to help prevent certain conditions and improve outcomes for people who have chronic diseases, like kidney disease and heart disease. What are tips for following this plan? Lifestyle  Cook and eat meals together with your family, when possible. Drink enough fluid to keep your urine clear or pale yellow. Be physically active every day. This includes: Aerobic exercise like running or swimming. Leisure activities like gardening, walking, or housework. Get 7-8 hours of sleep each night. If recommended by your health care provider, drink red wine in moderation. This means 1 glass a day for nonpregnant women and 2 glasses a day for men. A glass of wine equals 5 oz (150 mL). Reading food labels  Check the serving size of packaged foods. For foods such as rice and pasta, the serving size refers to the amount of cooked product, not dry. Check the total fat in packaged foods. Avoid foods that have saturated fat  or trans fats. Check the ingredients list for added sugars, such as corn syrup. Shopping  At the grocery store, buy most of your food from the areas near the walls of the store. This includes: Fresh fruits and vegetables (produce). Grains, beans, nuts, and seeds. Some of these may be  available in unpackaged forms or large amounts (in bulk). Fresh seafood. Poultry and eggs. Low-fat dairy products. Buy whole ingredients instead of prepackaged foods. Buy fresh fruits and vegetables in-season from local farmers markets. Buy frozen fruits and vegetables in resealable bags. If you do not have access to quality fresh seafood, buy precooked frozen shrimp or canned fish, such as tuna, salmon, or sardines. Buy small amounts of raw or cooked vegetables, salads, or olives from the deli or salad bar at your store. Stock your pantry so you always have certain foods on hand, such as olive oil, canned tuna, canned tomatoes, rice, pasta, and beans. Cooking  Cook foods with extra-virgin olive oil instead of using butter or other vegetable oils. Have meat as a side dish, and have vegetables or grains as your main dish. This means having meat in small portions or adding small amounts of meat to foods like pasta or stew. Use beans or vegetables instead of meat in common dishes like chili or lasagna. Experiment with different cooking methods. Try roasting or broiling vegetables instead of steaming or sauteing them. Add frozen vegetables to soups, stews, pasta, or rice. Add nuts or seeds for added healthy fat at each meal. You can add these to yogurt, salads, or vegetable dishes. Marinate fish or vegetables using olive oil, lemon juice, garlic, and fresh herbs. Meal planning  Plan to eat 1 vegetarian meal one day each week. Try to work up to 2 vegetarian meals, if possible. Eat seafood 2 or more times a week. Have healthy snacks readily available, such as: Vegetable sticks with hummus. Greek yogurt. Fruit and nut trail mix. Eat balanced meals throughout the week. This includes: Fruit: 2-3 servings a day Vegetables: 4-5 servings a day Low-fat dairy: 2 servings a day Fish, poultry, or lean meat: 1 serving a day Beans and legumes: 2 or more servings a week Nuts and seeds: 1-2 servings a  day Whole grains: 6-8 servings a day Extra-virgin olive oil: 3-4 servings a day Limit red meat and sweets to only a few servings a month What are my food choices? Mediterranean diet Recommended Grains: Whole-grain pasta. Brown rice. Bulgar wheat. Polenta. Couscous. Whole-wheat bread. [place]. Vegetables: Artichokes. Beets. Broccoli. Cabbage. Carrots. Eggplant. Green beans. Chard. Kale. Spinach. Onions. Leeks. Peas. Squash. Tomatoes. Peppers. Radishes. Fruits: Apples. Apricots. Avocado. Berries. Bananas. Cherries. Dates. Figs. Grapes. Lemons. Melon. Oranges. Peaches. Plums. Pomegranate. Meats and other protein foods: Beans. Almonds. Sunflower seeds. Pine nuts. Peanuts. [place]. Salmon. Scallops. Shrimp. [place]. Tilapia. Clams. Oysters. Eggs. Dairy: Low-fat milk. Cheese. Greek yogurt. Beverages: Water. Red wine. Herbal tea. Fats and oils: Extra virgin olive oil. Avocado oil. Grape seed oil. Sweets and desserts: [place] yogurt with honey. Baked apples. Poached pears. Trail mix. Seasoning and other foods: Basil. Cilantro. Coriander. Cumin. Mint. Parsley. Sage. Rosemary. Tarragon. Garlic. Oregano. Thyme. Pepper. Balsalmic vinegar. Tahini. Hummus. Tomato sauce. Olives. Mushrooms. Limit these Grains: Prepackaged pasta or rice dishes. Prepackaged cereal with added sugar. Vegetables: Deep fried potatoes (french fries). Fruits: Fruit canned in syrup. Meats and other protein foods: Beef. Pork. Lamb. Poultry with skin. Hot dogs. Berniece Salines. Dairy: Ice cream. Sour cream. Whole milk. Beverages: Juice. Sugar-sweetened soft drinks. Beer. Liquor and spirits.  Fats and oils: Butter. Canola oil. Vegetable oil. Beef fat (tallow). Lard. Sweets and desserts: Cookies. Cakes. Pies. Candy. Seasoning and other foods: Mayonnaise. Premade sauces and marinades. The items listed may not be a complete list. Talk with your dietitian about what dietary choices are right for you. Summary The Mediterranean diet includes both  food and lifestyle choices. Eat a variety of fresh fruits and vegetables, beans, nuts, seeds, and whole grains. Limit the amount of red meat and sweets that you eat. Talk with your health care provider about whether it is safe for you to drink red wine in moderation. This means 1 glass a day for nonpregnant women and 2 glasses a day for men. A glass of wine equals 5 oz (150 mL). This information is not intended to replace advice given to you by your health care provider. Make sure you discuss any questions you have with your health care provider. Document Released: 07/25/2016 Document Revised: 08/27/2016 Document Reviewed: 07/25/2016 Elsevier Interactive Patient Education  2017 [employer].

## 2022-10-22 ENCOUNTER — Other Ambulatory Visit: Payer: Self-pay | Admitting: Internal Medicine

## 2022-10-22 ENCOUNTER — Other Ambulatory Visit: Payer: Self-pay | Admitting: Nurse Practitioner

## 2022-10-22 DIAGNOSIS — R1312 Dysphagia, oropharyngeal phase: Secondary | ICD-10-CM | POA: Diagnosis not present

## 2022-10-22 DIAGNOSIS — N401 Enlarged prostate with lower urinary tract symptoms: Secondary | ICD-10-CM

## 2022-10-22 DIAGNOSIS — R41841 Cognitive communication deficit: Secondary | ICD-10-CM | POA: Diagnosis not present

## 2022-10-22 NOTE — Telephone Encounter (Signed)
Patient's daughter called again requesting referral to urology.  Message routed to [name] [name], NP

## 2022-10-23 ENCOUNTER — Encounter: Payer: Medicare Other | Admitting: Internal Medicine

## 2022-10-23 DIAGNOSIS — R41841 Cognitive communication deficit: Secondary | ICD-10-CM | POA: Diagnosis not present

## 2022-10-23 DIAGNOSIS — R1312 Dysphagia, oropharyngeal phase: Secondary | ICD-10-CM | POA: Diagnosis not present

## 2022-10-24 NOTE — Telephone Encounter (Signed)
Called and left message letting daughter know that referral was made

## 2022-10-26 DIAGNOSIS — R1312 Dysphagia, oropharyngeal phase: Secondary | ICD-10-CM | POA: Diagnosis not present

## 2022-10-26 DIAGNOSIS — R41841 Cognitive communication deficit: Secondary | ICD-10-CM | POA: Diagnosis not present

## 2022-10-28 DIAGNOSIS — R41841 Cognitive communication deficit: Secondary | ICD-10-CM | POA: Diagnosis not present

## 2022-10-28 DIAGNOSIS — R1312 Dysphagia, oropharyngeal phase: Secondary | ICD-10-CM | POA: Diagnosis not present

## 2022-10-29 DIAGNOSIS — H353133 Nonexudative age-related macular degeneration, bilateral, advanced atrophic without subfoveal involvement: Secondary | ICD-10-CM | POA: Diagnosis not present

## 2022-10-31 ENCOUNTER — Non-Acute Institutional Stay: Payer: Medicare Other | Admitting: Nurse Practitioner

## 2022-10-31 ENCOUNTER — Encounter: Payer: Self-pay | Admitting: Nurse Practitioner

## 2022-10-31 DIAGNOSIS — M19011 Primary osteoarthritis, right shoulder: Secondary | ICD-10-CM | POA: Diagnosis not present

## 2022-10-31 DIAGNOSIS — R350 Frequency of micturition: Secondary | ICD-10-CM

## 2022-10-31 DIAGNOSIS — I10 Essential (primary) hypertension: Secondary | ICD-10-CM

## 2022-10-31 DIAGNOSIS — R269 Unspecified abnormalities of gait and mobility: Secondary | ICD-10-CM | POA: Diagnosis not present

## 2022-10-31 DIAGNOSIS — F418 Other specified anxiety disorders: Secondary | ICD-10-CM

## 2022-10-31 DIAGNOSIS — K219 Gastro-esophageal reflux disease without esophagitis: Secondary | ICD-10-CM | POA: Diagnosis not present

## 2022-10-31 DIAGNOSIS — F5105 Insomnia due to other mental disorder: Secondary | ICD-10-CM

## 2022-10-31 DIAGNOSIS — G3184 Mild cognitive impairment, so stated: Secondary | ICD-10-CM | POA: Diagnosis not present

## 2022-10-31 DIAGNOSIS — I4811 Longstanding persistent atrial fibrillation: Secondary | ICD-10-CM | POA: Diagnosis not present

## 2022-10-31 DIAGNOSIS — E039 Hypothyroidism, unspecified: Secondary | ICD-10-CM | POA: Diagnosis not present

## 2022-10-31 DIAGNOSIS — R441 Visual hallucinations: Secondary | ICD-10-CM

## 2022-10-31 DIAGNOSIS — E782 Mixed hyperlipidemia: Secondary | ICD-10-CM

## 2022-10-31 DIAGNOSIS — H3530 Unspecified macular degeneration: Secondary | ICD-10-CM

## 2022-10-31 DIAGNOSIS — N401 Enlarged prostate with lower urinary tract symptoms: Secondary | ICD-10-CM | POA: Diagnosis not present

## 2022-10-31 NOTE — Assessment & Plan Note (Signed)
resolved,  CT head negative, underwent neurology evaluation

## 2022-10-31 NOTE — Assessment & Plan Note (Signed)
R shoulder, X-ray showed DJD, on Tylenol.

## 2022-10-31 NOTE — Assessment & Plan Note (Signed)
heart rate is in control, taking Eliquis, Metoprolol. F/u Cardiology 

## 2022-10-31 NOTE — Assessment & Plan Note (Signed)
stable on Famotidine. Hgb 14.3 10/15/22 

## 2022-10-31 NOTE — Assessment & Plan Note (Signed)
on Levothyroxine, TSH 2.02 10/15/22 

## 2022-10-31 NOTE — Assessment & Plan Note (Signed)
Mild cognitive impairment, uses walker. 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 Vit B12 465 07/04/22

## 2022-10-31 NOTE — Assessment & Plan Note (Signed)
uses walker. Risk of falling due to increased frailty and lack of safety awareness.

## 2022-10-31 NOTE — Assessment & Plan Note (Addendum)
GDR Lexapro-off, monitor insomnia.

## 2022-10-31 NOTE — Assessment & Plan Note (Signed)
Bp last night, Bp 112/70 upon my visit, did receive Clonidine 0.'1mg'$  x2, the patient stated he didn't sleep well, but he is in his usual state of health upon my visit. Denied dizziness, HA, change of vision, chest pain/pressure, palpitation, SOB, abd pain, or dysuria. He is afebrile.    HTN, on Metoprolol, Valsartan,  Bun/creat 17/1.11 10/15/22  Will add Amlodipine 2.'5mg'$  qd, Clonidine prn available to him, monitor Bp daily.

## 2022-10-31 NOTE — Assessment & Plan Note (Signed)
urinary frequency, on Terazosin, Proscar

## 2022-10-31 NOTE — Progress Notes (Signed)
Location:  [place] Room Number: NO/914/A Place of Service:  ALF (13) Provider:  Kerry Odonohue X, NP  Patient Care Team: Marcelis Wissner X, NP as PCP - General (Internal Medicine) Jarome Matin, MD as Consulting Physician (Dermatology) [place], [facility] Wardell Honour, MD as Attending Physician [facility] Medicine)  Extended Emergency Contact Information Primary Emergency Contact: [name],[name] Address: [place], [place] [address] Johnnette Litter of [place] Phone: [phone] Relation: Daughter Secondary Emergency Contact: Amasa of [place] Mobile Phone: [phone] Relation: Daughter  Code Status:  FULL Goals of care: Advanced Directive information    10/21/2022    9:10 AM  Advanced Directives  Does Patient Have a Medical Advance Directive? Yes     Chief Complaint  Patient presents with   Acute Visit    Patient is being seen for elevated BP    HPI:  Pt is a 88 y.o. male seen today for an acute visit for elevated Bp last night, Bp 112/70 upon my visit, did receive Clonidine 0.'1mg'$  x2, the patient stated he didn't sleep well, but he is in his usual state of health upon my visit. Denied dizziness, HA, change of vision, chest pain/pressure, palpitation, SOB, abd pain, or dysuria. He is afebrile.    HTN, on Metoprolol, Valsartan,  Bun/creat 17/1.11 10/15/22             HLD on Simvastatin, LDL 59 10/15/22             Hypothyroidism, on Levothyroxine, TSH 2.02 10/15/22             BPH, urinary frequency, on Terazosin, Proscar             Afib, heart rate is in control, taking Eliquis, Metoprolol. F/u Cardiology             OA R shoulder, X-ray showed DJD, on Tylenol.              MD, recent diagnosed             GERD stable on Famotidine. Hgb 14.3 10/15/22             Depression, GDR Lexapro-off             Visual hallucination, resolved,  CT head negative, underwent neurology evaluation             Gait  abnormality, uses walker, risk of falling.              Mild cognitive impairment, uses walker. 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 Vit B12 465 07/04/22      Past Medical History:  Diagnosis Date   Atrial fibrillation ([place]) 03/26/2011   Basal cell carcinoma of other specified sites of skin 4/10/012   Basal cell carcinoma of skin, site unspecified 03/25/2012   Benign neoplasm of colon 03/25/2012   adenomatous polyps   Broken arm 11/25/2017   Colon polyp 2014   Degeneration of lumbar or lumbosacral intervertebral disc 09/24/2011   Disturbance of skin sensation 01/28/2012   Diverticulosis 2014   Dysphagia, unspecified(787.20) 03/26/2011   Elevated prostate specific antigen (PSA) 03/26/2011   Exposure to unspecified radiation 03/26/2011   Family history of colon cancer 07/16/2013   GERD (gastroesophageal reflux disease) 07/16/2013   Hearing deficit 12/21/2013   Hemangioma of unspecified site 03/26/2011   Hx of adenomatous colonic polyps 07/16/2013   Hypertrophy of  prostate with urinary obstruction and other lower urinary tract symptoms (LUTS) 03/26/2011   Ingrowing nail 03/26/2011   Internal hemorrhoids without mention of complication 0/2/54   Keratosis, actinic 01/28/2012   Long term (current) use of anticoagulants 03/26/2011   Long term current use of anticoagulant therapy 07/23/2016   Lumbago 03/26/2011   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   Osteoarthrosis, unspecified whether generalized or localized, unspecified site 11/24/2012   Other and unspecified hyperlipidemia 03/26/2011   Other malaise and fatigue 03/26/2011   Pain in joint, forearm 11/24/2012   Pain in joint, lower leg 06/15/13   left knee   Paresthesia of left arm 07/05/2014   Rectal bleed 06/15/13   Reflux esophagitis 03/26/2011   Sacroiliitis, not elsewhere classified ([place]) 05/28/2011   Scoliosis (and kyphoscoliosis), idiopathic 05/28/2011   Squamous cell carcinoma of skin of upper  limb, including shoulder 03/26/2011   Tinnitus 12/21/2013   Transient ischemic attack (TIA), and cerebral infarction without residual deficits(V12.54) 03/26/2011   Unspecified essential hypertension 03/26/2011   Past Surgical History:  Procedure Laterality Date   BASAL CELL CARCINOMA EXCISION Left 12/10/14   Dr. Jarome Matin   COLONOSCOPY  08/12/2013   Dr. Hilarie Fredrickson adenomatous polyps   CRYOTHERAPY Left 2009   hand   PHOTODYNAMIC THERAPY  09/26/14   face and ear lobs  Dr. Jarome Matin   SQUAMOUS CELL CARCINOMA EXCISION Left 2010   hand    Allergies  Allergen Reactions   Ciprofloxacin Itching    Patient doesn't like the way it makes him feel   Lisinopril Itching    unknown   Sulfa Antibiotics Rash    Outpatient Encounter Medications as of 10/31/2022  Medication Sig   ELIQUIS 5 MG TABS tablet TAKE 1 TABLET TWICE A DAY FOR ANTICOAGULATION   escitalopram (LEXAPRO) 10 MG tablet TAKE 1 TABLET DAILY   famotidine (PEPCID) 20 MG tablet Take 20 mg by mouth daily as needed for heartburn or indigestion.   finasteride (PROSCAR) 5 MG tablet TAKE 1 TABLET DAILY FOR URINATING DRIBBLING/PROSTATE ENLARGEMENT   levothyroxine (SYNTHROID) 25 MCG tablet TAKE 1 TABLET DAILY BEFORE BREAKFAST   metoprolol succinate (TOPROL-XL) 50 MG 24 hr tablet TAKE 1 TABLET TWICE A DAY   WITH OR IMMEDIATELY FOLLOWING A MEAL TO CONTROL BLOOD PRESSURE AND HEART RHYTHM   Multiple Vitamins-Minerals (PRESERVISION AREDS) CAPS Take 2 capsules by mouth daily.   simvastatin (ZOCOR) 10 MG tablet TAKE 1 TABLET DAILY TO LOWER CHOLESTEROL   terazosin (HYTRIN) 2 MG capsule TAKE 1 CAPSULE DAILY   valsartan (DIOVAN) 160 MG tablet TAKE 1 TABLET DAILY   No facility-administered encounter medications on file as of 10/31/2022.    Review of Systems  Constitutional:  Negative for appetite change, fatigue and fever.  HENT:  Positive for hearing loss. Negative for congestion and trouble swallowing.   Eyes:  Negative for visual disturbance.   Respiratory:  Negative for cough and shortness of breath.   Gastrointestinal:  Negative for abdominal pain and constipation.  Genitourinary:  Positive for frequency. Negative for dysuria and urgency.  Musculoskeletal:  Positive for arthralgias and gait problem.  Skin:  Negative for color change.  Psychiatric/Behavioral:  Negative for confusion, dysphoric mood, hallucinations and sleep disturbance.        Feels better    Immunization History  Administered Date(s) Administered   Fluad Quad(high Dose 65+) 10/07/2022   Influenza Split 11/18/2007, 10/23/2009, 10/10/2010   Influenza Whole 09/15/2012, 09/16/2013   Influenza, High Dose Seasonal PF  09/24/2017, 09/20/2021   Influenza,inj,Quad PF,6+ Mos 09/17/2018   Influenza-Unspecified 09/29/2014, 09/14/2015, 09/26/2016, 09/05/2020   Moderna Covid-19 Vaccine Bivalent Booster 42yr & up 09/05/2021, 10/17/2022   Moderna SARS-COV2 Booster Vaccination 12/31/2019, 05/23/2021   Moderna Sars-Covid-2 Vaccination 12/20/2019, 01/17/2020   PPD Test 10/10/2010, 11/27/2010   Pneumococcal Conjugate-13 02/08/2016   Pneumococcal Polysaccharide-23 01/17/1996   Pneumococcal-Unspecified 01/17/1996   Td 02/14/2006, 09/04/2016   Td (Adult), 2 Lf Tetanus Toxid, Preservative Free 02/14/2006, 09/04/2016   Tdap 03/05/2006   Zoster Recombinat (Shingrix) 03/31/2018, 06/23/2018   Zoster, Live 03/05/2006   Pertinent  Health Maintenance Due  Topic Date Due   INFLUENZA VACCINE  Completed   COLONOSCOPY (Pts 45-463yrInsurance coverage will need to be confirmed)  Discontinued      06/05/2022    9:31 AM 06/20/2022    3:15 PM 06/22/2022    3:41 PM 07/04/2022    1:24 PM 10/21/2022    9:10 AM  [place] the past year? '1   1 1  '$ Was there an injury with Fall? 0   0 1  Fall Risk Category Calculator '1   2 3  '$ Fall Risk Category Low   Moderate High  Patient Fall Risk Level Low fall risk Moderate fall risk Moderate fall risk High fall risk High fall risk  Patient at  Risk for Falls Due to History of fall(s)      Fall risk Follow up Falls evaluation completed    Falls evaluation completed   Functional Status Survey:    Vitals:   10/31/22 1055  BP: (!) 170/98  Pulse: 75  Resp: 18  Temp: 99 F (37.2 C)  SpO2: 99%  Weight: 192 lb (87.1 kg)  Height: '5\' 7"'$  (1.702 m)   Body mass index is 30.07 kg/m. Physical Exam Constitutional:      Appearance: Normal appearance.  HENT:     Head: Normocephalic and atraumatic.     Nose: Nose normal.     Mouth/Throat:     Mouth: Mucous membranes are moist.  Eyes:     Extraocular Movements: Extraocular movements intact.     Conjunctiva/sclera: Conjunctivae normal.     Pupils: Pupils are equal, round, and reactive to light.  Cardiovascular:     Rate and Rhythm: Normal rate. Rhythm irregular.     Heart sounds: No murmur heard. Pulmonary:     Effort: Pulmonary effort is normal.     Breath sounds: No wheezing or rales.  Abdominal:     Palpations: Abdomen is soft.     Tenderness: There is no abdominal tenderness.  Musculoskeletal:        General: Normal range of motion.     Cervical back: Normal range of motion and neck supple.     Right lower leg: No edema.     Left lower leg: No edema.  Skin:    General: Skin is warm and dry.  Neurological:     General: No focal deficit present.     Mental Status: He is alert and oriented to person, place, and time. Mental status is at baseline.     Gait: Gait abnormal.  Psychiatric:        Mood and Affect: Mood normal.        Behavior: Behavior normal.        Thought Content: Thought content normal.     Labs reviewed: Recent Labs    06/20/22 1529 06/22/22 1557 10/15/22 0730  NA 138 139 141  K 4.6 4.3  4.3  CL 104 103 106  CO2 '24 27 29  '$ GLUCOSE 109* 107* 80  BUN '14 17 17  '$ CREATININE 1.05 1.18 1.11  CALCIUM 9.2 9.9 9.1   Recent Labs    04/18/22 0850 06/20/22 1529 10/15/22 0730  AST '18 19 11  '$ ALT 12 12 7*  ALKPHOS  --  79  --   BILITOT 0.5 1.4*  0.7  PROT 6.7 6.8 6.5  ALBUMIN  --  3.5  --    Recent Labs    04/18/22 0850 06/20/22 1529 06/22/22 1557 10/15/22 0730  WBC 7.7 13.3* 8.3 6.8  NEUTROABS 5,529 10.9*  --  4,869  HGB 15.2 15.2 14.6 14.3  HCT 44.1 45.3 43.1 41.4  MCV 95.2 96.8 96.9 95.0  PLT 187 175 201 182   Lab Results  Component Value Date   TSH 2.02 10/15/2022   No results found for: "HGBA1C" Lab Results  Component Value Date   CHOL 123 10/15/2022   HDL 50 10/15/2022   LDLCALC 59 10/15/2022   TRIG 65 10/15/2022   CHOLHDL 2.5 10/15/2022    Significant Diagnostic Results in last 30 days:  No results found.  Assessment/Plan Essential hypertension Bp last night, Bp 112/70 upon my visit, did receive Clonidine 0.'1mg'$  x2, the patient stated he didn't sleep well, but he is in his usual state of health upon my visit. Denied dizziness, HA, change of vision, chest pain/pressure, palpitation, SOB, abd pain, or dysuria. He is afebrile.    HTN, on Metoprolol, Valsartan,  Bun/creat 17/1.11 10/15/22  Will add Amlodipine 2.'5mg'$  qd, Clonidine prn available to him, monitor Bp daily.   Hyperlipidemia on Simvastatin, LDL 59 10/15/22  Hypothyroidism  on Levothyroxine, TSH 2.02 10/15/22  Benign prostatic hyperplasia with urinary frequency  urinary frequency, on Terazosin, Proscar  Atrial fibrillation (HCC) heart rate is in control, taking Eliquis, Metoprolol. F/u Cardiology  Osteoarthritis of right shoulder R shoulder, X-ray showed DJD, on Tylenol.   Macular degeneration recent diagnosed  GERD (gastroesophageal reflux disease)  stable on Famotidine. Hgb 14.3 10/15/22  Insomnia secondary to depression with anxiety GDR Lexapro-off, monitor insomnia.   Visual hallucination  resolved,  CT head negative, underwent neurology evaluation  Gait abnormality uses walker. Risk of falling due to increased frailty and lack of safety awareness.   Mild cognitive impairment Mild cognitive impairment, uses walker. MMSE  27/30, CT head microvascular ischemic disease and cerebral atrophy. Vit B12 465 07/04/22        Family/ staff Communication: plan of care reviewed with the patient and charge nurse.   Labs/tests ordered: none  Time spend 40 minutes.

## 2022-10-31 NOTE — Assessment & Plan Note (Signed)
recent diagnosed

## 2022-10-31 NOTE — Assessment & Plan Note (Signed)
on Simvastatin, LDL 59 10/15/22 

## 2022-11-01 ENCOUNTER — Encounter: Payer: Self-pay | Admitting: Nurse Practitioner

## 2022-11-03 DIAGNOSIS — R41841 Cognitive communication deficit: Secondary | ICD-10-CM | POA: Diagnosis not present

## 2022-11-03 DIAGNOSIS — R1312 Dysphagia, oropharyngeal phase: Secondary | ICD-10-CM | POA: Diagnosis not present

## 2022-11-04 DIAGNOSIS — R1312 Dysphagia, oropharyngeal phase: Secondary | ICD-10-CM | POA: Diagnosis not present

## 2022-11-04 DIAGNOSIS — R41841 Cognitive communication deficit: Secondary | ICD-10-CM | POA: Diagnosis not present

## 2022-11-05 DIAGNOSIS — R1312 Dysphagia, oropharyngeal phase: Secondary | ICD-10-CM | POA: Diagnosis not present

## 2022-11-05 DIAGNOSIS — R41841 Cognitive communication deficit: Secondary | ICD-10-CM | POA: Diagnosis not present

## 2022-11-08 DIAGNOSIS — R1312 Dysphagia, oropharyngeal phase: Secondary | ICD-10-CM | POA: Diagnosis not present

## 2022-11-08 DIAGNOSIS — R41841 Cognitive communication deficit: Secondary | ICD-10-CM | POA: Diagnosis not present

## 2022-11-11 DIAGNOSIS — R1312 Dysphagia, oropharyngeal phase: Secondary | ICD-10-CM | POA: Diagnosis not present

## 2022-11-11 DIAGNOSIS — R41841 Cognitive communication deficit: Secondary | ICD-10-CM | POA: Diagnosis not present

## 2022-11-13 DIAGNOSIS — R1312 Dysphagia, oropharyngeal phase: Secondary | ICD-10-CM | POA: Diagnosis not present

## 2022-11-13 DIAGNOSIS — R41841 Cognitive communication deficit: Secondary | ICD-10-CM | POA: Diagnosis not present

## 2022-11-20 DIAGNOSIS — R41841 Cognitive communication deficit: Secondary | ICD-10-CM | POA: Diagnosis not present

## 2022-11-22 DIAGNOSIS — R41841 Cognitive communication deficit: Secondary | ICD-10-CM | POA: Diagnosis not present

## 2022-11-25 DIAGNOSIS — R41841 Cognitive communication deficit: Secondary | ICD-10-CM | POA: Diagnosis not present

## 2022-11-27 ENCOUNTER — Non-Acute Institutional Stay: Payer: Medicare Other | Admitting: Adult Health

## 2022-11-27 ENCOUNTER — Other Ambulatory Visit: Payer: Self-pay | Admitting: Adult Health

## 2022-11-27 ENCOUNTER — Encounter: Payer: Self-pay | Admitting: Adult Health

## 2022-11-27 DIAGNOSIS — I10 Essential (primary) hypertension: Secondary | ICD-10-CM | POA: Diagnosis not present

## 2022-11-27 DIAGNOSIS — J069 Acute upper respiratory infection, unspecified: Secondary | ICD-10-CM

## 2022-11-27 MED ORDER — BENZONATATE 100 MG PO CAPS: 100.0000 mg | ORAL_CAPSULE | Freq: Three times a day (TID) | ORAL | 0 refills | Status: DC

## 2022-11-27 MED ORDER — AZITHROMYCIN 250 MG PO TABS: ORAL_TABLET | ORAL | 0 refills | Status: AC

## 2022-11-27 NOTE — Progress Notes (Signed)
Location:   [place] Room Number: 893 Place of Service:  ALF [phone]) Provider:  Durenda Age, NP  [name], [name], NP  Patient Care Team: [name], [name], NP as PCP - General (Internal Medicine) Jarome Matin, MD as Consulting Physician (Dermatology) [place], [facility] Wardell Honour, MD as Attending Physician [facility] Medicine)  Extended Emergency Contact Information Primary Emergency Contact: [name],[name] Address: [place], [place] [address] Johnnette Litter of [place] Phone: [phone] Relation: Daughter Secondary Emergency Contact: [place] of [place] Mobile Phone: [phone] Relation: Daughter  Code Status:  FULL CODE Goals of care: Advanced Directive information    11/27/2022   11:39 AM  Advanced Directives  Does Patient Have a Medical Advance Directive? Yes  Type of Paramedic of [place];Living will  Does patient want to make changes to medical advance directive? No - Patient declined  Copy of [place] in Chart? Yes - validated most recent copy scanned in chart (See row information)     Chief Complaint  Patient presents with   Acute Visit    Cough    HPI:  Pt is a 88 y.o. male seen today for an acute visit for cough. He was reported to have productive cough, sore throat and nasal congestion. COVID-19 test done yesterday was negative. Repeat COVID-19 test was, again, negative today. He was seen sitting up in a chair in his room He verbalized feeling weak. There was no reported fever.  BP 142/72, takes Amlodipine, Diovan and Metoprolol succinate  Past Medical History:  Diagnosis Date   Atrial fibrillation ([place]) 03/26/2011   Basal cell carcinoma of other specified sites of skin 4/10/012   Basal cell carcinoma of skin, site unspecified 03/25/2012   Benign neoplasm of colon 03/25/2012   adenomatous polyps   Broken arm 11/25/2017   Colon polyp  2014   Degeneration of lumbar or lumbosacral intervertebral disc 09/24/2011   Disturbance of skin sensation 01/28/2012   Diverticulosis 2014   Dysphagia, unspecified(787.20) 03/26/2011   Elevated prostate specific antigen (PSA) 03/26/2011   Exposure to unspecified radiation 03/26/2011   Family history of colon cancer 07/16/2013   GERD (gastroesophageal reflux disease) 07/16/2013   Hearing deficit 12/21/2013   Hemangioma of unspecified site 03/26/2011   Hx of adenomatous colonic polyps 07/16/2013   Hypertrophy of prostate with urinary obstruction and other lower urinary tract symptoms (LUTS) 03/26/2011   Ingrowing nail 03/26/2011   Internal hemorrhoids without mention of complication 04/18/63   Keratosis, actinic 01/28/2012   Long term (current) use of anticoagulants 03/26/2011   Long term current use of anticoagulant therapy 07/23/2016   Lumbago 03/26/2011   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   Osteoarthrosis, unspecified whether generalized or localized, unspecified site 11/24/2012   Other and unspecified hyperlipidemia 03/26/2011   Other malaise and fatigue 03/26/2011   Pain in joint, forearm 11/24/2012   Pain in joint, lower leg 06/15/13   left knee   Paresthesia of left arm 07/05/2014   Rectal bleed 06/15/13   Reflux esophagitis 03/26/2011   Sacroiliitis, not elsewhere classified ([place]) 05/28/2011   Scoliosis (and kyphoscoliosis), idiopathic 05/28/2011   Squamous cell carcinoma of skin of upper limb, including shoulder 03/26/2011   Tinnitus 12/21/2013   Transient ischemic attack (TIA), and cerebral infarction without residual deficits(V12.54) 03/26/2011   Unspecified essential hypertension 03/26/2011   Past Surgical History:  Procedure Laterality  Date   BASAL CELL CARCINOMA EXCISION Left 12/10/14   Dr. Jarome Matin   COLONOSCOPY  08/12/2013   Dr. Hilarie Fredrickson adenomatous polyps   CRYOTHERAPY Left 2009   hand   PHOTODYNAMIC THERAPY  09/26/14   face and ear lobs  Dr. Jarome Matin   SQUAMOUS  CELL CARCINOMA EXCISION Left 2010   hand    Allergies  Allergen Reactions   Ciprofloxacin Itching    Patient doesn't like the way it makes him feel   Lisinopril Itching    unknown   Sulfa Antibiotics Rash    Allergies as of 11/27/2022       Reactions   Ciprofloxacin Itching   Patient doesn't like the way it makes him feel   Lisinopril Itching   unknown   Sulfa Antibiotics Rash        Medication List        Accurate as of November 27, 2022 11:55 AM. If you have any questions, ask your nurse or doctor.          STOP taking these medications    escitalopram 10 MG tablet Commonly known as: LEXAPRO Stopped by: Durenda Age, NP       TAKE these medications    acetaminophen 500 MG tablet Commonly known as: TYLENOL Take 500 mg by mouth daily.   amLODipine 2.5 MG tablet Commonly known as: NORVASC Take 2.5 mg by mouth daily.   azithromycin 250 MG tablet Commonly known as: ZITHROMAX Take 2 tablets on day 1, then 1 tablet daily on days 2 through 5 Started by: Elizabella Nolet Medina-Vargas, NP   cloNIDine 0.1 MG tablet Commonly known as: CATAPRES Take 0.1 mg by mouth daily as needed. IF BP GREATER THAN 180/100   Eliquis 5 MG Tabs tablet Generic drug: apixaban TAKE 1 TABLET TWICE A DAY FOR ANTICOAGULATION   famotidine 20 MG tablet Commonly known as: PEPCID Take 20 mg by mouth daily as needed for heartburn or indigestion.   finasteride 5 MG tablet Commonly known as: PROSCAR TAKE 1 TABLET DAILY FOR URINATING DRIBBLING/PROSTATE ENLARGEMENT   levothyroxine 25 MCG tablet Commonly known as: SYNTHROID TAKE 1 TABLET DAILY BEFORE BREAKFAST   metoprolol succinate 50 MG 24 hr tablet Commonly known as: TOPROL-XL TAKE 1 TABLET TWICE A DAY   WITH OR IMMEDIATELY FOLLOWING A MEAL TO CONTROL BLOOD PRESSURE AND HEART RHYTHM   PreserVision AREDS Caps Take 2 capsules by mouth daily.   simvastatin 10 MG tablet Commonly known as: ZOCOR TAKE 1 TABLET DAILY TO LOWER  CHOLESTEROL   terazosin 2 MG capsule Commonly known as: HYTRIN TAKE 1 CAPSULE DAILY   valsartan 320 MG tablet Commonly known as: DIOVAN Take 320 mg by mouth daily. What changed: Another medication with the same name was removed. Continue taking this medication, and follow the directions you see here. Changed by: Durenda Age, NP        Review of Systems  Constitutional:  Negative for activity change, appetite change, chills and fever.  HENT:  Positive for congestion and sore throat.   Eyes: Negative.   Respiratory:  Positive for cough. Negative for shortness of breath.   Cardiovascular:  Negative for chest pain and leg swelling.  Gastrointestinal:  Negative for abdominal distention, diarrhea and vomiting.  Genitourinary:  Negative for dysuria, frequency and urgency.  Skin:  Negative for color change.  Neurological:  Positive for weakness. Negative for dizziness and headaches.  Psychiatric/Behavioral:  Negative for behavioral problems and sleep disturbance. The patient is not nervous/anxious.  Immunization History  Administered Date(s) Administered   Fluad Quad(high Dose 65+) 10/07/2022   Influenza Split 11/18/2007, 10/23/2009, 10/10/2010   Influenza Whole 09/15/2012, 09/16/2013   Influenza, High Dose Seasonal PF 09/24/2017, 09/20/2021   Influenza,inj,Quad PF,6+ Mos 09/17/2018   Influenza-Unspecified 09/29/2014, 09/14/2015, 09/26/2016, 09/05/2020   Moderna Covid-19 Vaccine Bivalent Booster 77yr & up 09/05/2021, 10/17/2022   Moderna SARS-COV2 Booster Vaccination 12/31/2019, 05/23/2021   Moderna Sars-Covid-2 Vaccination 12/20/2019, 01/17/2020   PPD Test 10/10/2010, 11/27/2010   Pneumococcal Conjugate-13 02/08/2016   Pneumococcal Polysaccharide-23 01/17/1996   Pneumococcal-Unspecified 01/17/1996   Td 02/14/2006, 09/04/2016   Td (Adult), 2 Lf Tetanus Toxid, Preservative Free 02/14/2006, 09/04/2016   Tdap 03/05/2006   Zoster Recombinat (Shingrix) 03/31/2018,  06/23/2018   Zoster, Live 03/05/2006   Pertinent  Health Maintenance Due  Topic Date Due   INFLUENZA VACCINE  Completed   COLONOSCOPY (Pts 45-426yrInsurance coverage will need to be confirmed)  Discontinued      06/05/2022    9:31 AM 06/20/2022    3:15 PM 06/22/2022    3:41 PM 07/04/2022    1:24 PM 10/21/2022    9:10 AM  FaDentn the past year? '1   1 1  '$ Was there an injury with Fall? 0   0 1  Fall Risk Category Calculator '1   2 3  '$ Fall Risk Category Low   Moderate High  Patient Fall Risk Level Low fall risk Moderate fall risk Moderate fall risk High fall risk High fall risk  Patient at Risk for Falls Due to History of fall(s)      Fall risk Follow up Falls evaluation completed    Falls evaluation completed   Functional Status Survey:    Vitals:   11/27/22 1137  BP: (!) 142/74  Pulse: 75  Resp: 18  Temp: 97.6 F (36.4 C)  SpO2: 96%  Weight: 187 lb 9.6 oz (85.1 kg)  Height: '5\' 7"'$  (1.702 m)   Body mass index is 29.38 kg/m. Physical Exam Constitutional:      Appearance: Normal appearance. He is ill-appearing.  HENT:     Head: Normocephalic and atraumatic.     Mouth/Throat:     Mouth: Mucous membranes are moist.  Eyes:     Conjunctiva/sclera: Conjunctivae normal.  Cardiovascular:     Rate and Rhythm: Normal rate. Rhythm irregular.     Pulses: Normal pulses.     Heart sounds: Normal heart sounds.  Pulmonary:     Effort: Pulmonary effort is normal.     Breath sounds: Normal breath sounds.  Abdominal:     General: Bowel sounds are normal.     Palpations: Abdomen is soft.  Musculoskeletal:        General: No swelling. Normal range of motion.     Cervical back: Normal range of motion.  Skin:    General: Skin is warm and dry.  Neurological:     General: No focal deficit present.     Mental Status: He is alert and oriented to person, place, and time.  Psychiatric:        Mood and Affect: Mood normal.        Behavior: Behavior normal.        Thought  Content: Thought content normal.        Judgment: Judgment normal.     Labs reviewed: Recent Labs    06/20/22 1529 06/22/22 1557 10/15/22 0730  NA 138 139 141  K 4.6 4.3 4.3  CL 104 103  106  CO2 '24 27 29  '$ GLUCOSE 109* 107* 80  BUN '14 17 17  '$ CREATININE 1.05 1.18 1.11  CALCIUM 9.2 9.9 9.1   Recent Labs    04/18/22 0850 06/20/22 1529 10/15/22 0730  AST '18 19 11  '$ ALT 12 12 7*  ALKPHOS  --  79  --   BILITOT 0.5 1.4* 0.7  PROT 6.7 6.8 6.5  ALBUMIN  --  3.5  --    Recent Labs    04/18/22 0850 06/20/22 1529 06/22/22 1557 10/15/22 0730  WBC 7.7 13.3* 8.3 6.8  NEUTROABS 5,529 10.9*  --  4,869  HGB 15.2 15.2 14.6 14.3  HCT 44.1 45.3 43.1 41.4  MCV 95.2 96.8 96.9 95.0  PLT 187 175 201 182   Lab Results  Component Value Date   TSH 2.02 10/15/2022   No results found for: "HGBA1C" Lab Results  Component Value Date   CHOL 123 10/15/2022   HDL 50 10/15/2022   LDLCALC 59 10/15/2022   TRIG 65 10/15/2022   CHOLHDL 2.5 10/15/2022    Significant Diagnostic Results in last 30 days:  No results found.  Assessment/Plan  1. Acute URI -  will start on Z-pak -  encourage fluid intake  2. Essential hypertension -  BPs stable -  continue current medications -  monitor BPs    Family/ staff Communication:  Discussed plan of care with resident and charge nurse.  Labs/tests ordered:  None

## 2022-11-28 ENCOUNTER — Ambulatory Visit: Payer: Medicare Other | Admitting: Urology

## 2022-11-28 ENCOUNTER — Encounter: Payer: Self-pay | Admitting: Nurse Practitioner

## 2022-11-28 ENCOUNTER — Non-Acute Institutional Stay: Payer: Medicare Other | Admitting: Nurse Practitioner

## 2022-11-28 DIAGNOSIS — E039 Hypothyroidism, unspecified: Secondary | ICD-10-CM

## 2022-11-28 DIAGNOSIS — K219 Gastro-esophageal reflux disease without esophagitis: Secondary | ICD-10-CM

## 2022-11-28 DIAGNOSIS — I4811 Longstanding persistent atrial fibrillation: Secondary | ICD-10-CM | POA: Diagnosis not present

## 2022-11-28 DIAGNOSIS — R350 Frequency of micturition: Secondary | ICD-10-CM

## 2022-11-28 DIAGNOSIS — G3184 Mild cognitive impairment, so stated: Secondary | ICD-10-CM

## 2022-11-28 DIAGNOSIS — F5105 Insomnia due to other mental disorder: Secondary | ICD-10-CM | POA: Diagnosis not present

## 2022-11-28 DIAGNOSIS — N390 Urinary tract infection, site not specified: Secondary | ICD-10-CM | POA: Diagnosis not present

## 2022-11-28 DIAGNOSIS — M19011 Primary osteoarthritis, right shoulder: Secondary | ICD-10-CM

## 2022-11-28 DIAGNOSIS — E782 Mixed hyperlipidemia: Secondary | ICD-10-CM

## 2022-11-28 DIAGNOSIS — H3530 Unspecified macular degeneration: Secondary | ICD-10-CM

## 2022-11-28 DIAGNOSIS — R441 Visual hallucinations: Secondary | ICD-10-CM

## 2022-11-28 DIAGNOSIS — R531 Weakness: Secondary | ICD-10-CM | POA: Diagnosis not present

## 2022-11-28 DIAGNOSIS — I10 Essential (primary) hypertension: Secondary | ICD-10-CM

## 2022-11-28 DIAGNOSIS — N401 Enlarged prostate with lower urinary tract symptoms: Secondary | ICD-10-CM

## 2022-11-28 DIAGNOSIS — R269 Unspecified abnormalities of gait and mobility: Secondary | ICD-10-CM

## 2022-11-28 DIAGNOSIS — R509 Fever, unspecified: Secondary | ICD-10-CM | POA: Diagnosis not present

## 2022-11-28 DIAGNOSIS — F418 Other specified anxiety disorders: Secondary | ICD-10-CM

## 2022-11-28 NOTE — Assessment & Plan Note (Signed)
Blood pressure is controlled, on Metoprolol, Valsartan,  Bun/creat 17/1.11 10/15/22 

## 2022-11-28 NOTE — Assessment & Plan Note (Signed)
uses walker. 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 Vit B12 465 07/04/22, saw neurology 10/21/22

## 2022-11-28 NOTE — Assessment & Plan Note (Signed)
on Simvastatin, LDL 59 10/15/22 

## 2022-11-28 NOTE — Progress Notes (Addendum)
Location:  [place] Room Number: NO/914/A Place of Service:  ALF (13) Provider:  Nilam Quakenbush X, NP  Patient Care Team: Dulse Rutan X, NP as PCP - General (Internal Medicine) Jarome Matin, MD as Consulting Physician (Dermatology) [place], [facility] Wardell Honour, MD as Attending Physician [facility] Medicine)  Extended Emergency Contact Information Primary Emergency Contact: [name],[name] Address: [place], [place] [address] Johnnette Litter of [place] Phone: [phone] Relation: Daughter Secondary Emergency Contact: [place] of [place] Mobile Phone: [phone] Relation: Daughter  Code Status:  FULL Goals of care: Advanced Directive information    11/27/2022   11:39 AM  Advanced Directives  Does Patient Have a Medical Advance Directive? Yes  Type of Paramedic of [place];Living will  Does patient want to make changes to medical advance directive? No - Patient declined  Copy of [place] in Chart? Yes - validated most recent copy scanned in chart (See row information)     Chief Complaint  Patient presents with   Acute Visit    Patient is being seen for fever    HPI:  Pt is a 88 y.o. male seen today for an acute visit for congestive cough, fever, poor oral intake. Started Z-pk yesterday 11/27/22. Denied chest pain, no O2 desaturation, negative COVID.      HTN, on Metoprolol, Valsartan,  Bun/creat 17/1.11 10/15/22             HLD on Simvastatin, LDL 59 10/15/22             Hypothyroidism, on Levothyroxine, TSH 2.02 10/15/22             BPH, urinary frequency, on Terazosin, Proscar             Afib, heart rate is in control, taking Eliquis, Metoprolol. F/u Cardiology             OA R shoulder, X-ray showed DJD, on Tylenol.              MD, recent diagnosed             GERD stable on Famotidine. Hgb 14.3 10/15/22             Depression, GDR Lexapro-off,  stable.              Visual hallucination, resolved,  CT head negative, underwent neurology evaluation             Gait abnormality, uses walker, risk of falling.              Mild cognitive impairment, uses walker. 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 Vit B12 465 07/04/22. Saw neurology 10/21/22    Past Medical History:  Diagnosis Date   Atrial fibrillation ([place]) 03/26/2011   Basal cell carcinoma of other specified sites of skin 4/10/012   Basal cell carcinoma of skin, site unspecified 03/25/2012   Benign neoplasm of colon 03/25/2012   adenomatous polyps   Broken arm 11/25/2017   Colon polyp 2014   Degeneration of lumbar or lumbosacral intervertebral disc 09/24/2011   Disturbance of skin sensation 01/28/2012   Diverticulosis 2014   Dysphagia, unspecified(787.20) 03/26/2011   Elevated prostate specific antigen (PSA) 03/26/2011   Exposure to unspecified radiation 03/26/2011   Family history of colon cancer 07/16/2013   GERD (gastroesophageal reflux disease) 07/16/2013   Hearing deficit 12/21/2013  Hemangioma of unspecified site 03/26/2011   Hx of adenomatous colonic polyps 07/16/2013   Hypertrophy of prostate with urinary obstruction and other lower urinary tract symptoms (LUTS) 03/26/2011   Ingrowing nail 03/26/2011   Internal hemorrhoids without mention of complication 01/23/24   Keratosis, actinic 01/28/2012   Long term (current) use of anticoagulants 03/26/2011   Long term current use of anticoagulant therapy 07/23/2016   Lumbago 03/26/2011   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   Osteoarthrosis, unspecified whether generalized or localized, unspecified site 11/24/2012   Other and unspecified hyperlipidemia 03/26/2011   Other malaise and fatigue 03/26/2011   Pain in joint, forearm 11/24/2012   Pain in joint, lower leg 06/15/13   left knee   Paresthesia of left arm 07/05/2014   Rectal bleed 06/15/13   Reflux esophagitis 03/26/2011   Sacroiliitis,  not elsewhere classified ([place]) 05/28/2011   Scoliosis (and kyphoscoliosis), idiopathic 05/28/2011   Squamous cell carcinoma of skin of upper limb, including shoulder 03/26/2011   Tinnitus 12/21/2013   Transient ischemic attack (TIA), and cerebral infarction without residual deficits(V12.54) 03/26/2011   Unspecified essential hypertension 03/26/2011   Past Surgical History:  Procedure Laterality Date   BASAL CELL CARCINOMA EXCISION Left 12/10/14   Dr. Jarome Matin   COLONOSCOPY  08/12/2013   Dr. Hilarie Fredrickson adenomatous polyps   CRYOTHERAPY Left 2009   hand   PHOTODYNAMIC THERAPY  09/26/14   face and ear lobs  Dr. Jarome Matin   SQUAMOUS CELL CARCINOMA EXCISION Left 2010   hand    Allergies  Allergen Reactions   Ciprofloxacin Itching    Patient doesn't like the way it makes him feel   Lisinopril Itching    unknown   Sulfa Antibiotics Rash    Outpatient Encounter Medications as of 11/28/2022  Medication Sig   acetaminophen (TYLENOL) 500 MG tablet Take 500 mg by mouth daily.   amLODipine (NORVASC) 2.5 MG tablet Take 2.5 mg by mouth daily.   azithromycin (ZITHROMAX) 250 MG tablet Take 2 tablets on day 1, then 1 tablet daily on days 2 through 5   cloNIDine (CATAPRES) 0.1 MG tablet Take 0.1 mg by mouth daily as needed. IF BP GREATER THAN 180/100   ELIQUIS 5 MG TABS tablet TAKE 1 TABLET TWICE A DAY FOR ANTICOAGULATION   famotidine (PEPCID) 20 MG tablet Take 20 mg by mouth daily as needed for heartburn or indigestion.   finasteride (PROSCAR) 5 MG tablet TAKE 1 TABLET DAILY FOR URINATING DRIBBLING/PROSTATE ENLARGEMENT   levothyroxine (SYNTHROID) 25 MCG tablet TAKE 1 TABLET DAILY BEFORE BREAKFAST   metoprolol succinate (TOPROL-XL) 50 MG 24 hr tablet TAKE 1 TABLET TWICE A DAY   WITH OR IMMEDIATELY FOLLOWING A MEAL TO CONTROL BLOOD PRESSURE AND HEART RHYTHM   Multiple Vitamins-Minerals (PRESERVISION AREDS) CAPS Take 2 capsules by mouth daily.   simvastatin (ZOCOR) 10 MG tablet TAKE 1 TABLET DAILY TO  LOWER CHOLESTEROL   terazosin (HYTRIN) 2 MG capsule TAKE 1 CAPSULE DAILY   valsartan (DIOVAN) 320 MG tablet Take 320 mg by mouth daily.   No facility-administered encounter medications on file as of 11/28/2022.    Review of Systems  Constitutional:  Positive for appetite change, fatigue and fever.  HENT:  Positive for hearing loss. Negative for congestion and trouble swallowing.   Eyes:  Negative for visual disturbance.  Respiratory:  Positive for cough. Negative for shortness of breath and wheezing.   Cardiovascular:  Negative for leg swelling.  Gastrointestinal:  Negative for abdominal pain  and constipation.  Genitourinary:  Positive for frequency. Negative for dysuria and urgency.  Musculoskeletal:  Positive for arthralgias and gait problem.  Skin:  Negative for color change.  Neurological:  Negative for speech difficulty, weakness and headaches.  Psychiatric/Behavioral:  Negative for confusion, hallucinations and sleep disturbance.        Feels better    Immunization History  Administered Date(s) Administered   Fluad Quad(high Dose 65+) 10/07/2022   Influenza Split 11/18/2007, 10/23/2009, 10/10/2010   Influenza Whole 09/15/2012, 09/16/2013   Influenza, High Dose Seasonal PF 09/24/2017, 09/20/2021   Influenza,inj,Quad PF,6+ Mos 09/17/2018   Influenza-Unspecified 09/29/2014, 09/14/2015, 09/26/2016, 09/05/2020   Moderna Covid-19 Vaccine Bivalent Booster 17yr & up 09/05/2021, 10/17/2022   Moderna SARS-COV2 Booster Vaccination 12/31/2019, 05/23/2021   Moderna Sars-Covid-2 Vaccination 12/20/2019, 01/17/2020   PPD Test 10/10/2010, 11/27/2010   Pneumococcal Conjugate-13 02/08/2016   Pneumococcal Polysaccharide-23 01/17/1996   Pneumococcal-Unspecified 01/17/1996   Td 02/14/2006, 09/04/2016   Td (Adult), 2 Lf Tetanus Toxid, Preservative Free 02/14/2006, 09/04/2016   Tdap 03/05/2006   Zoster Recombinat (Shingrix) 03/31/2018, 06/23/2018   Zoster, Live 03/05/2006   Pertinent   Health Maintenance Due  Topic Date Due   INFLUENZA VACCINE  Completed   COLONOSCOPY (Pts 45-454yrInsurance coverage will need to be confirmed)  Discontinued      06/05/2022    9:31 AM 06/20/2022    3:15 PM 06/22/2022    3:41 PM 07/04/2022    1:24 PM 10/21/2022    9:10 AM  [place] the past year? _0 Was there an injury with Fall? 0   0 1  Fall Risk Category Calculator _1 Fall Risk Category Low   Moderate High  Patient Fall Risk Level Low fall risk Moderate fall risk Moderate fall risk High fall risk High fall risk  Patient at Risk for Falls Due to History of fall(s)      Fall risk Follow up Falls evaluation completed    Falls evaluation completed   Functional Status Survey:    Vitals:   11/28/22 1427  BP: 136/72  Pulse: 95  Resp: 20  Temp: (!) 102.5 F (39.2 C)  SpO2: 95%  Weight: 187 lb (84.8 kg)  Height: _2  (1.702 m)   Body mass index is 29.29 kg/m. Physical Exam Constitutional:      Comments: Tired, alert, seen in bed  HENT:     Head: Normocephalic and atraumatic.     Nose: Nose normal.     Mouth/Throat:     Mouth: Mucous membranes are moist.  Eyes:     Extraocular Movements: Extraocular movements intact.     Conjunctiva/sclera: Conjunctivae normal.     Pupils: Pupils are equal, round, and reactive to light.  Cardiovascular:     Rate and Rhythm: Normal rate. Rhythm irregular.     Heart sounds: No murmur heard. Pulmonary:     Effort: Pulmonary effort is normal.     Breath sounds: No wheezing, rhonchi or rales.     Comments: Central congestion.  Abdominal:     Palpations: Abdomen is soft.     Tenderness: There is no abdominal tenderness. There is no right CVA tenderness, guarding or rebound.  Musculoskeletal:        General: Normal range of motion.     Cervical back: Normal range of motion and neck supple.     Right lower leg: No edema.     Left lower leg:  No edema.  Skin:    General: Skin is warm and dry.  Neurological:      General: No focal deficit present.     Mental Status: He is alert and oriented to person, place, and time. Mental status is at baseline.     Gait: Gait abnormal.  Psychiatric:        Mood and Affect: Mood normal.        Behavior: Behavior normal.        Thought Content: Thought content normal.     Labs reviewed: Recent Labs    06/20/22 1529 06/22/22 1557 10/15/22 0730  NA 138 139 141  K 4.6 4.3 4.3  CL 104 103 106  CO2 _0 GLUCOSE 109* 107* 80  BUN _1 CREATININE 1.05 1.18 1.11  CALCIUM 9.2 9.9 9.1   Recent Labs    04/18/22 0850 06/20/22 1529 10/15/22 0730  AST _2 ALT 12 12 7*  ALKPHOS  --  79  --   BILITOT 0.5 1.4* 0.7  PROT 6.7 6.8 6.5  ALBUMIN  --  3.5  --    Recent Labs    04/18/22 0850 06/20/22 1529 06/22/22 1557 10/15/22 0730  WBC 7.7 13.3* 8.3 6.8  NEUTROABS 5,529 10.9*  --  4,869  HGB 15.2 15.2 14.6 14.3  HCT 44.1 45.3 43.1 41.4  MCV 95.2 96.8 96.9 95.0  PLT 187 175 201 182   Lab Results  Component Value Date   TSH 2.02 10/15/2022   No results found for: "HGBA1C" Lab Results  Component Value Date   CHOL 123 10/15/2022   HDL 50 10/15/2022   LDLCALC 59 10/15/2022   TRIG 65 10/15/2022   CHOLHDL 2.5 10/15/2022    Significant Diagnostic Results in last 30 days:  No results found.  Assessment/Plan Fever congestive cough, fever, poor oral intake. Started Z-pk yesterday 11/27/22. Denied chest pain, no O2 desaturation, negative COVID. Will add Rocephin 1gm IM qd x3 days, stat CXR ap/lateral, CBC/diff, CMP/eGFR, UA C/S, ED eval if no better.  11/28/22 wbc 8.1, Hgb 13.6, plt 169, neutrophils 83.5, Na 135, K 4.1, Bun 18, creat 1.08 11/28/22 UA clear. 11/29/22 CXR no acute cardiopulmonary disease   Essential hypertension Blood pressure is controlled, on Metoprolol, Valsartan,  Bun/creat 17/1.11 10/15/22  Hyperlipidemia on Simvastatin, LDL 59 10/15/22  Hypothyroidism  on Levothyroxine, TSH 2.02 10/15/22  Benign prostatic  hyperplasia with urinary frequency urinary frequency, on Terazosin, Proscar  Atrial fibrillation (HCC) heart rate is in control, taking Eliquis, Metoprolol. F/u Cardiology  Osteoarthritis of right shoulder R shoulder, X-ray showed DJD, on Tylenol.   Macular degeneration recent diagnosed  GERD (gastroesophageal reflux disease) stable on Famotidine. Hgb 14.3 10/15/22  Insomnia secondary to depression with anxiety Stable,  GDR Lexapro-off, stable.   Visual hallucination resolved,  CT head negative, underwent neurology evaluation  Gait abnormality uses walker, risk of falling.   Mild cognitive impairment uses walker. 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 Vit B12 465 07/04/22, saw neurology 10/21/22       Family/ staff Communication: plan of care reviewed with the patient, the patient's daughter, and charge nurse.   Labs/tests ordered: CXR ap/lateral, CBC/diff, CMP/eGFR, UA C/S  Time spend 40 minutes

## 2022-11-28 NOTE — Assessment & Plan Note (Signed)
resolved,  CT head negative, underwent neurology evaluation

## 2022-11-28 NOTE — Assessment & Plan Note (Signed)
uses walker, risk of falling.

## 2022-11-28 NOTE — Assessment & Plan Note (Signed)
heart rate is in control, taking Eliquis, Metoprolol. F/u Cardiology 

## 2022-11-28 NOTE — Assessment & Plan Note (Signed)
urinary frequency, on Terazosin, Proscar

## 2022-11-28 NOTE — Assessment & Plan Note (Signed)
stable on Famotidine. Hgb 14.3 10/15/22 

## 2022-11-28 NOTE — Assessment & Plan Note (Signed)
on Levothyroxine, TSH 2.02 10/15/22 

## 2022-11-28 NOTE — Assessment & Plan Note (Signed)
R shoulder, X-ray showed DJD, on Tylenol.

## 2022-11-28 NOTE — Assessment & Plan Note (Signed)
recent diagnosed

## 2022-11-28 NOTE — Assessment & Plan Note (Signed)
Stable,  GDR Lexapro-off, stable.

## 2022-11-28 NOTE — Assessment & Plan Note (Addendum)
congestive cough, fever, poor oral intake. Started Z-pk yesterday 11/27/22. Denied chest pain, no O2 desaturation, negative COVID. Will add Rocephin 1gm IM qd x3 days, stat CXR ap/lateral, CBC/diff, CMP/eGFR, UA C/S, ED eval if no better.  11/28/22 wbc 8.1, Hgb 13.6, plt 169, neutrophils 83.5, Na 135, K 4.1, Bun 18, creat 1.08 11/28/22 UA clear. 11/29/22 CXR no acute cardiopulmonary disease  

## 2022-11-29 DIAGNOSIS — R059 Cough, unspecified: Secondary | ICD-10-CM | POA: Diagnosis not present

## 2022-12-19 ENCOUNTER — Ambulatory Visit (INDEPENDENT_AMBULATORY_CARE_PROVIDER_SITE_OTHER): Payer: Medicare Other | Admitting: Urology

## 2022-12-19 ENCOUNTER — Encounter: Payer: Self-pay | Admitting: Urology

## 2022-12-19 VITALS — BP 128/87 | HR 82 | Ht 67.0 in | Wt 200.0 lb

## 2022-12-19 DIAGNOSIS — R3915 Urgency of urination: Secondary | ICD-10-CM | POA: Insufficient documentation

## 2022-12-19 DIAGNOSIS — N401 Enlarged prostate with lower urinary tract symptoms: Secondary | ICD-10-CM

## 2022-12-19 DIAGNOSIS — N138 Other obstructive and reflux uropathy: Secondary | ICD-10-CM | POA: Diagnosis not present

## 2022-12-19 DIAGNOSIS — N3941 Urge incontinence: Secondary | ICD-10-CM | POA: Diagnosis not present

## 2022-12-19 LAB — URINALYSIS
Bilirubin, UA: NEGATIVE
Glucose, UA: NEGATIVE mg/dL
Ketones, UA: NEGATIVE
Leukocytes, UA: NEGATIVE
Nitrite, UA: NEGATIVE
Protein, UA: NEGATIVE
Spec Grav, UA: >=1.03 — AB (ref 1.010–1.025)
Urobilinogen, UA: 0.2 E.U./dL
pH, UA: 5.5 (ref 5.0–8.0)

## 2022-12-19 LAB — BLADDER SCAN AMB NON-IMAGING

## 2022-12-19 NOTE — Progress Notes (Signed)
Assessment: 1. Urinary urgency   2. BPH with obstruction/lower urinary tract symptoms   3. Urge incontinence     Plan: I reviewed the patient's chart including provider notes and available lab results. Continue terazosin and finasteride. I discussed options for management of his urgency with occasional urge incontinence.  Specifically, I discussed a trial of medical therapy.  At the present time, the patient and his daughter would like to monitor his symptoms rather than begin any additional medications.  I think this is reasonable given his current clinical situation.  I advised them to contact me if he has worsening of his symptoms and is interested in treatment. Return to office as needed.  Chief Complaint:  Chief Complaint  Patient presents with   Benign Prostatic Hypertrophy    History of Present Illness:  Phillip [name] is a 89 y.o. male who is seen in consultation from [name], [name], NP for evaluation of BPH and lower urinary tract symptoms.  [facility]  He has a history of lower urinary tract symptoms managed with with [place] and and finasteride for several years.  He has recently had increased urgency with several episodes of urge incontinence.  He is using adult depends on a fairly regular basis.  No daytime frequency.  Nocturia 0-1 time.  No dysuria or gross hematuria.  He was diagnosed with a UTI in June 2023.  No records available.  A follow-up urine culture from July 2023 showed no growth.  He does have a prior history of UTIs with at least 1 episode of sepsis requiring hospitalization.  No current UTI symptoms.  He continues on [place] and and finasteride.  Past Medical History:  Past Medical History:  Diagnosis Date   Atrial fibrillation ([place]) 03/26/2011   Basal cell carcinoma of other specified sites of skin 4/10/012   Basal cell carcinoma of skin, site unspecified 03/25/2012   Benign neoplasm of colon 03/25/2012   adenomatous  polyps   Broken arm 11/25/2017   Colon polyp 2014   Degeneration of lumbar or lumbosacral intervertebral disc 09/24/2011   Disturbance of skin sensation 01/28/2012   Diverticulosis 2014   Dysphagia, unspecified(787.20) 03/26/2011   Elevated prostate specific antigen (PSA) 03/26/2011   Exposure to unspecified radiation 03/26/2011   Family history of colon cancer 07/16/2013   GERD (gastroesophageal reflux disease) 07/16/2013   Hearing deficit 12/21/2013   Hemangioma of unspecified site 03/26/2011   Hx of adenomatous colonic polyps 07/16/2013   Hypertrophy of prostate with urinary obstruction and other lower urinary tract symptoms (LUTS) 03/26/2011   Ingrowing nail 03/26/2011   Internal hemorrhoids without mention of complication 0/9/47   Keratosis, actinic 01/28/2012   Long term (current) use of anticoagulants 03/26/2011   Long term current use of anticoagulant therapy 07/23/2016   Lumbago 03/26/2011   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   Osteoarthrosis, unspecified whether generalized or localized, unspecified site 11/24/2012   Other and unspecified hyperlipidemia 03/26/2011   Other malaise and fatigue 03/26/2011   Pain in joint, forearm 11/24/2012   Pain in joint, lower leg 06/15/13   left knee   Paresthesia of left arm 07/05/2014   Rectal bleed 06/15/13   Reflux esophagitis 03/26/2011   Sacroiliitis, not elsewhere classified ([place]) 05/28/2011   Scoliosis (and kyphoscoliosis), idiopathic 05/28/2011   Squamous cell carcinoma of skin of upper limb, including shoulder 03/26/2011   Tinnitus 12/21/2013   Transient ischemic attack (TIA), and cerebral  infarction without residual deficits(V12.54) 03/26/2011   Unspecified essential hypertension 03/26/2011    Past Surgical History:  Past Surgical History:  Procedure Laterality Date   BASAL CELL CARCINOMA EXCISION Left 12/10/14   Dr. Jarome Matin   COLONOSCOPY  08/12/2013   Dr. Hilarie Fredrickson adenomatous polyps   CRYOTHERAPY Left 2009   hand    PHOTODYNAMIC THERAPY  09/26/14   face and ear lobs  Dr. Jarome Matin   SQUAMOUS CELL CARCINOMA EXCISION Left 2010   hand    Allergies:  Allergies  Allergen Reactions   Ciprofloxacin Itching    Patient doesn't like the way it makes him feel   Lisinopril Itching    unknown   Sulfa Antibiotics Rash    Family History:  Family History  Problem Relation Age of Onset   Cancer Mother        colon   Heart disease Father        MI    Social History:  Social History   Tobacco Use   Smoking status: Never   Smokeless tobacco: Never  Vaping Use   Vaping Use: Never used  Substance Use Topics   Alcohol use: No   Drug use: No    Review of symptoms:  Constitutional:  Negative for unexplained weight loss, night sweats, fever, chills ENT:  Negative for nose bleeds, sinus pain, painful swallowing CV:  Negative for chest pain, shortness of breath, exercise intolerance, palpitations, loss of consciousness Resp:  Negative for cough, wheezing, shortness of breath GI:  Negative for nausea, vomiting, diarrhea, bloody stools GU:  Positives noted in HPI; otherwise negative for gross hematuria, dysuria, urinary incontinence Neuro:  Negative for seizures, poor balance, limb weakness, slurred speech Psych:  Negative for lack of energy, depression, anxiety Endocrine:  Negative for polydipsia, polyuria, symptoms of hypoglycemia (dizziness, hunger, sweating) Hematologic:  Negative for anemia, purpura, petechia, prolonged or excessive bleeding, use of anticoagulants  Allergic:  Negative for difficulty breathing or choking as a result of exposure to anything; no shellfish allergy; no allergic response (rash/itch) to materials, foods  Physical exam: BP 128/87   Pulse 82   Ht '5\' 7"'$  (1.702 m)   Wt 200 lb (90.7 kg)   BMI 31.32 kg/m  GENERAL APPEARANCE:  Well appearing, well developed, well nourished, NAD HEENT: Atraumatic, Normocephalic, oropharynx clear. NECK: Supple without lymphadenopathy or  thyromegaly. LUNGS: Clear to auscultation bilaterally. HEART: Regular Rate and Rhythm without murmurs, gallops, or rubs. ABDOMEN: Soft, non-tender, No Masses. EXTREMITIES: Moves all extremities well.  Without clubbing, cyanosis, or edema. NEUROLOGIC:  Alert and oriented x 3, uses walker, CN II-XII grossly intact.  MENTAL STATUS:  Appropriate. BACK:  Non-tender to palpation.  No CVAT SKIN:  Warm, dry and intact.   GU: Penis:  circumcised Meatus: Normal Scrotum: normal, no masses Testis: normal without masses bilateral Prostate: 50 g, NT, no nodules Rectum: Normal tone,  no masses or tenderness   Results: Results for orders placed or performed in visit on 12/19/22 (from the past 24 hour(s))  Urinalysis     Status: Abnormal   Collection Time: 12/19/22 12:00 AM  Result Value Ref Range   Glucose, UA negative negative mg/dL   Bilirubin, UA negative    Ketones, UA negative    Spec Grav, UA >=1.030 (A) 1.010 - 1.025   Blood, UA trace-intact    pH, UA 5.5 5.0 - 8.0   Protein, UA Negative Negative   Urobilinogen, UA 0.2 0.2 or 1.0 E.U./dL   Nitrite, UA negative  Leukocytes, UA Negative Negative     PVR: 6 ml

## 2022-12-31 ENCOUNTER — Other Ambulatory Visit: Payer: Self-pay | Admitting: Internal Medicine

## 2022-12-31 ENCOUNTER — Non-Acute Institutional Stay: Payer: Medicare Other | Admitting: Family Medicine

## 2022-12-31 DIAGNOSIS — I10 Essential (primary) hypertension: Secondary | ICD-10-CM | POA: Diagnosis not present

## 2022-12-31 DIAGNOSIS — Z7901 Long term (current) use of anticoagulants: Secondary | ICD-10-CM

## 2022-12-31 DIAGNOSIS — N138 Other obstructive and reflux uropathy: Secondary | ICD-10-CM | POA: Diagnosis not present

## 2022-12-31 DIAGNOSIS — I4811 Longstanding persistent atrial fibrillation: Secondary | ICD-10-CM | POA: Diagnosis not present

## 2022-12-31 DIAGNOSIS — N401 Enlarged prostate with lower urinary tract symptoms: Secondary | ICD-10-CM

## 2022-12-31 DIAGNOSIS — G3184 Mild cognitive impairment, so stated: Secondary | ICD-10-CM

## 2022-12-31 DIAGNOSIS — F32A Depression, unspecified: Secondary | ICD-10-CM

## 2022-12-31 NOTE — Progress Notes (Signed)
Provider:  Alain Honey, MD Location:      Place of Service:     PCP: [name], [name], NP Patient Care Team: [name], [name], NP as PCP - General (Internal Medicine) Jarome Matin, MD as Consulting Physician (Dermatology) [place], [facility] Wardell Honour, MD as Attending Physician [facility] Medicine)  Extended Emergency Contact Information Primary Emergency Contact: [name],[name] Address: [place], [place] [address] Johnnette [address]itter of [place] Phone: [phone] Relation: Daughter Secondary Emergency Contact: [place] of [place] Mobile Phone: [phone] Relation: Daughter  Code Status:  Goals of Care: Advanced Directive information    11/27/2022   11:39 AM  Advanced Directives  Does Patient Have a Medical Advance Directive? Yes  Type of Paramedic of [place];Living will  Does patient want to make changes to medical advance directive? No - Patient declined  Copy of [place] in Chart? Yes - validated most recent copy scanned in chart (See row information)      No chief complaint on file.   HPI: Patient is a 89 y.o. male seen today for medical management of chronic problems including longstanding persistent A-fib, hypertension, hyperlipidemia, hypothyroidism, BPH, cognitive impairment. He lives in assisted living and today he seems confused as to why he is living in assisted living compared to independent living.  I attempted to explain rationale as well as help him understand his wife living in memory care which is more an extension of assisted living with ability to lock doors to keep resident safe.  He requested that I come someday when his daughter was visiting so we could talk about his situation. He voices no complaints today.  He is just come back from visiting his wife and was napping in his room. He was able to give coherent answers and recall seemed mostly appropriate except for  the inability to understand his current living situation and the necessity thereof.  Past Medical History:  Diagnosis Date   Atrial fibrillation ([place]) 03/26/2011   Basal cell carcinoma of other specified sites of skin 4/10/012   Basal cell carcinoma of skin, site unspecified 03/25/2012   Benign neoplasm of colon 03/25/2012   adenomatous polyps   Broken arm 11/25/2017   Colon polyp 2014   Degeneration of lumbar or lumbosacral intervertebral disc 09/24/2011   Disturbance of skin sensation 01/28/2012   Diverticulosis 2014   Dysphagia, unspecified(787.20) 03/26/2011   Elevated prostate specific antigen (PSA) 03/26/2011   Exposure to unspecified radiation 03/26/2011   Family history of colon cancer 07/16/2013   GERD (gastroesophageal reflux disease) 07/16/2013   Hearing deficit 12/21/2013   Hemangioma of unspecified site 03/26/2011   Hx of adenomatous colonic polyps 07/16/2013   Hypertrophy of prostate with urinary obstruction and other lower urinary tract symptoms (LUTS) 03/26/2011   Ingrowing nail 03/26/2011   Internal hemorrhoids without mention of complication 08/20/62   Keratosis, actinic 01/28/2012   Long term (current) use of anticoagulants 03/26/2011   Long term current use of anticoagulant therapy 07/23/2016   Lumbago 03/26/2011   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   Osteoarthrosis, unspecified whether generalized or localized, unspecified site 11/24/2012   Other and unspecified hyperlipidemia 03/26/2011   Other malaise and fatigue 03/26/2011   Pain in joint, forearm 11/24/2012   Pain in joint, lower leg 06/15/13   left knee   Paresthesia of left arm 07/05/2014   Rectal bleed 06/15/13  Reflux esophagitis 03/26/2011   Sacroiliitis, not elsewhere classified ([place]) 05/28/2011   Scoliosis (and kyphoscoliosis), idiopathic 05/28/2011   Squamous cell carcinoma of skin of upper limb, including shoulder 03/26/2011   Tinnitus 12/21/2013   Transient ischemic attack (TIA), and cerebral  infarction without residual deficits(V12.54) 03/26/2011   Unspecified essential hypertension 03/26/2011   Past Surgical History:  Procedure Laterality Date   BASAL CELL CARCINOMA EXCISION Left 12/10/14   Dr. Jarome Matin   COLONOSCOPY  08/12/2013   Dr. Hilarie Fredrickson adenomatous polyps   CRYOTHERAPY Left 2009   hand   PHOTODYNAMIC THERAPY  09/26/14   face and ear lobs  Dr. Jarome Matin   SQUAMOUS CELL CARCINOMA EXCISION Left 2010   hand    reports that he has never smoked. He has never used smokeless tobacco. He reports that he does not drink alcohol and does not use drugs. 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    Employer: RETIRED  Tobacco Use   Smoking status: Never   Smokeless tobacco: Never  Vaping Use   Vaping Use: Never used  Substance and Sexual Activity   Alcohol use: No   Drug use: No   Sexual activity: Not on file  Other Topics Concern   Not on file  Social History Narrative   [facility] at [facility] since 11/[facility]   Married - Webb [employer]versmith   Does not have Living Will   Never smoked   Alcohol none   Exercise: tennis, pool, table tennis 2-3 times a week   Several daughters   Social Determinants of Health   Financial Resource Strain: Low Risk  (03/25/2022)   Overall Financial Resource Strain (CARDIA)    Difficulty of Paying Living Expenses: Not hard at all  Food Insecurity: No Food Insecurity (03/25/2022)   Hunger Vital Sign    Worried About Running Out of Food in the Last Year: Never true    [place] in the Last Year: Never true  Transportation Needs: No Transportation Needs (03/25/2022)   PRAPARE - Hydrologist (Medical): No    Lack of Transportation (Non-Medical): No  Physical Activity: Inactive  (03/25/2022)   Exercise Vital Sign    Days of Exercise per Week: 0 days    Minutes of Exercise per Session: 0 min  Stress: No Stress Concern Present (03/25/2022)   [place]    Feeling of Stress : Only a little  Social Connections: Moderately Isolated (03/25/2022)   Social Connection and Isolation Panel [NHANES]    Frequency of Communication with Friends and Family: More than three times a week    Frequency of Social Gatherings with Friends and Family: More than three times a week    Attends Religious Services: Never    Marine scientist or Organizations: No    Attends Archivist Meetings: Never    Marital Status: Married  Human resources officer Violence: Not At Risk (03/25/2022)   Humiliation, Afraid, Rape, and Kick questionnaire    Fear of Current or Ex-Partner: No    Emotionally Abused: No    Physically Abused: No    Sexually Abused: No    Functional Status  Survey:    Family History  Problem Relation Age of Onset   Cancer Mother        colon   Heart disease Father        MI    Health Maintenance  Topic Date Due   COVID-19 Vaccine (5 - 2023-24 season) 12/12/2022   Medicare Annual Wellness (AWV)  03/26/2023   DTaP/Tdap/Td (5 - Td or Tdap) 09/04/2026   Pneumonia Vaccine 78+ Years old  Completed   INFLUENZA VACCINE  Completed   Zoster Vaccines- Shingrix  Completed   HPV VACCINES  Aged Out   COLONOSCOPY (Pts 45-41yr Insurance coverage will need to be confirmed)  Discontinued    Allergies  Allergen Reactions   Ciprofloxacin Itching    Patient doesn't like the way it makes him feel   Lisinopril Itching    unknown   Sulfa Antibiotics Rash    Outpatient Encounter Medications as of 12/31/2022  Medication Sig   acetaminophen (TYLENOL) 500 MG tablet Take 500 mg by mouth daily.   amLODipine (NORVASC) 2.5 MG tablet Take 2.5 mg by mouth daily.   cloNIDine (CATAPRES) 0.1 MG tablet Take 0.1 mg by  mouth daily as needed. IF BP GREATER THAN 180/100   ELIQUIS 5 MG TABS tablet TAKE 1 TABLET TWICE A DAY FOR ANTICOAGULATION   famotidine (PEPCID) 20 MG tablet Take 20 mg by mouth daily as needed for heartburn or indigestion.   finasteride (PROSCAR) 5 MG tablet TAKE 1 TABLET DAILY FOR URINATING DRIBBLING/PROSTATE ENLARGEMENT   levothyroxine (SYNTHROID) 25 MCG tablet TAKE 1 TABLET DAILY BEFORE BREAKFAST   metoprolol succinate (TOPROL-XL) 50 MG 24 hr tablet TAKE 1 TABLET TWICE A DAY   WITH OR IMMEDIATELY FOLLOWING A MEAL TO CONTROL BLOOD PRESSURE AND HEART RHYTHM   simvastatin (ZOCOR) 10 MG tablet TAKE 1 TABLET DAILY TO LOWER CHOLESTEROL   terazosin (HYTRIN) 2 MG capsule TAKE 1 CAPSULE DAILY   valsartan (DIOVAN) 320 MG tablet Take 320 mg by mouth daily.   No facility-administered encounter medications on file as of 12/31/2022.    Review of Systems  Constitutional: Negative.   HENT:  Positive for hearing loss.   Respiratory: Negative.    Cardiovascular: Negative.   Genitourinary:  Positive for frequency.  Musculoskeletal:  Positive for gait problem.  Hematological: Negative.   Psychiatric/Behavioral:  Positive for confusion.   All other systems reviewed and are negative.   There were no vitals filed for this visit. There is no height or weight on file to calculate BMI. Physical Exam Vitals and nursing note reviewed.  Constitutional:      Appearance: Normal appearance.  HENT:     Head: Normocephalic.     Mouth/Throat:     Mouth: Mucous membranes are moist.     Pharynx: Oropharynx is clear.  Eyes:     Conjunctiva/sclera: Conjunctivae normal.     Pupils: Pupils are equal, round, and reactive to light.  Cardiovascular:     Rate and Rhythm: Normal rate. Rhythm irregular.  Pulmonary:     Effort: Pulmonary effort is normal.     Breath sounds: Normal breath sounds.  Neurological:     General: No focal deficit present.     Mental Status: He is alert and oriented to person, place, and  time.  Psychiatric:        Mood and Affect: Mood normal.        Behavior: Behavior normal.     Labs reviewed: Basic Metabolic Panel: Recent Labs  06/20/22 1529 06/22/22 1557 10/15/22 0730  NA 138 139 141  K 4.6 4.3 4.3  CL 104 103 106  CO2 '24 27 29  '$ GLUCOSE 109* 107* 80  BUN '14 17 17  '$ CREATININE 1.05 1.18 1.11  CALCIUM 9.2 9.9 9.1   Liver Function Tests: Recent Labs    04/18/22 0850 06/20/22 1529 10/15/22 0730  AST '18 19 11  '$ ALT 12 12 7*  ALKPHOS  --  79  --   BILITOT 0.5 1.4* 0.7  PROT 6.7 6.8 6.5  ALBUMIN  --  3.5  --    Recent Labs    06/20/22 1529  LIPASE 33   No results for input(s): "AMMONIA" in the last 8760 hours. CBC: Recent Labs    04/18/22 0850 06/20/22 1529 06/22/22 1557 10/15/22 0730  WBC 7.7 13.3* 8.3 6.8  NEUTROABS 5,529 10.9*  --  4,869  HGB 15.2 15.2 14.6 14.3  HCT 44.1 45.3 43.1 41.4  MCV 95.2 96.8 96.9 95.0  PLT 187 175 201 182   Cardiac Enzymes: No results for input(s): "CKTOTAL", "CKMB", "CKMBINDEX", "TROPONINI" in the last 8760 hours. BNP: Invalid input(s): "POCBNP" No results found for: "HGBA1C" Lab Results  Component Value Date   TSH 2.02 10/15/2022   Lab Results  Component Value Date   [id number] 093 07/04/2022   No results found for: "FOLATE" No results found for: "IRON", "TIBC", "FERRITIN"  Imaging and Procedures obtained prior to SNF admission: EEG adult  Result Date: 07/26/2022 Cameron Sprang, MD     07/26/2022  1:16 PM ELECTROENCEPHALOGRAM REPORT Date of Study: 07/26/2022 Patient's Name: Keaundre Thelin MRN: [id number] Date of Birth: 05/06/1929 Referring Provider: Sharene Butters, PA-C Clinical History: This is a 89 year old man with hallucinations, episode of urinary incontinence. EEG for classification. Medications: Eliquis, Lexapro, Synthroid, Proscar, Toprol, Simvastatin, Hytrin, [place] Technical Summary: A multichannel digital EEG recording measured by the international 10-20 system with electrodes applied with  paste and impedances below 5000 ohms performed in our laboratory with EKG monitoring in a predominantly drowsy and asleep patient.  Hyperventilation was not performed. Photic stimulation was performed.  The digital EEG was referentially recorded, reformatted, and digitally filtered in a variety of bipolar and referential montages for optimal display.  Description: The patient is predominantly drowsy and asleep during the recording.  During brief period of wakefulness, there is a symmetric, medium voltage 8 Hz posterior dominant rhythm that attenuates with eye opening.  The record is symmetric.  During drowsiness and sleep, there is an increase in theta and delta slowing of the background.  Vertex waves and symmetric sleep spindles were seen. Photic stimulation did not elicit any abnormalities.  There were no epileptiform discharges or electrographic seizures seen.  EKG lead was unremarkable. Impression: This predominantly drowsy and asleep EEG is normal.  Clinical Correlation: A normal EEG does not exclude a clinical diagnosis of epilepsy.  If further clinical questions remain, prolonged EEG may be helpful.  Clinical correlation is advised. Ellouise Newer, M.D.    Assessment/Plan 1. Longstanding persistent atrial fibrillation (HCC) Taking metoprolol and Eliquis  2. BPH with obstruction/lower urinary tract symptoms He is on 2 medications, finasteride to strength prostate and [place] and to help with bladder emptying  3. Depression, unspecified depression type Currently off Lexapro with gradual dose reduction.  Will monitor  4. Essential hypertension  Has beta-blocker metoprolol Val ARB valsartan and calcium channel blocker amlodipine with as needed clonidine 5. Long term current use of anticoagulant therapy Due to A-fib; on  Eliquis  6. Mild cognitive impairment Memory seems pretty good today.  Most recent MMSE was 27/30 which shows consistent with mild impairment   Family/ staff Communication:    Labs/tests ordered:  Lillette Boxer. Sabra Heck, Post [address] [place], [place] Office [phone]

## 2023-01-09 ENCOUNTER — Ambulatory Visit: Payer: Medicare Other | Admitting: Physician Assistant

## 2023-01-23 ENCOUNTER — Encounter: Payer: Medicare Other | Admitting: Nurse Practitioner

## 2023-01-30 ENCOUNTER — Encounter: Payer: Self-pay | Admitting: Nurse Practitioner

## 2023-01-30 ENCOUNTER — Non-Acute Institutional Stay: Payer: Medicare Other | Admitting: Nurse Practitioner

## 2023-01-30 VITALS — BP 142/80 | HR 84 | Temp 97.5°F | Resp 18 | Ht 67.0 in | Wt 197.0 lb

## 2023-01-30 DIAGNOSIS — I10 Essential (primary) hypertension: Secondary | ICD-10-CM

## 2023-01-30 DIAGNOSIS — G3184 Mild cognitive impairment, so stated: Secondary | ICD-10-CM | POA: Diagnosis not present

## 2023-01-30 DIAGNOSIS — K219 Gastro-esophageal reflux disease without esophagitis: Secondary | ICD-10-CM | POA: Diagnosis not present

## 2023-01-30 DIAGNOSIS — N401 Enlarged prostate with lower urinary tract symptoms: Secondary | ICD-10-CM

## 2023-01-30 DIAGNOSIS — I4811 Longstanding persistent atrial fibrillation: Secondary | ICD-10-CM

## 2023-01-30 DIAGNOSIS — E039 Hypothyroidism, unspecified: Secondary | ICD-10-CM | POA: Diagnosis not present

## 2023-01-30 DIAGNOSIS — N138 Other obstructive and reflux uropathy: Secondary | ICD-10-CM | POA: Diagnosis not present

## 2023-01-30 DIAGNOSIS — E782 Mixed hyperlipidemia: Secondary | ICD-10-CM | POA: Diagnosis not present

## 2023-01-30 DIAGNOSIS — F32A Depression, unspecified: Secondary | ICD-10-CM

## 2023-01-30 NOTE — Assessment & Plan Note (Signed)
GDR Lexapro-off, stable.

## 2023-01-30 NOTE — Assessment & Plan Note (Signed)
stable on Famotidine. Hgb 14.3 10/15/22

## 2023-01-30 NOTE — Assessment & Plan Note (Signed)
heart rate is in control, taking Eliquis, Metoprolol. F/u Cardiology

## 2023-01-30 NOTE — Progress Notes (Signed)
Location:   clinic [place] Room Number: AL/914/A Place of Service:  Clinic (12) Provider: Marlana Latus NP  Code Status: DNR Goals of Care:     11/27/2022   11:39 AM  Advanced Directives  Does Patient Have a Medical Advance Directive? Yes  Type of Paramedic of [place];Living will  Does patient want to make changes to medical advance directive? No - Patient declined  Copy of [place] in Chart? Yes - validated most recent copy scanned in chart (See row information)     Chief Complaint  Patient presents with   Medical Management of Chronic Issues    Patient is here for a 79M F/U for incontinence    Quality Metric Gaps    Patient is due for AWV scheduled for 03/20/2023    HPI: Patient is a 89 y.o. male seen today for medical management of chronic diseases.      HTN, on Metoprolol, Valsartan,  Bun/creat 17/1.11 10/15/22             HLD on Simvastatin, LDL 59 10/15/22             Hypothyroidism, on Levothyroxine, TSH 2.02 10/15/22             BPH, urinary frequency, on Terazosin, Proscar, saw Urology 12/19/22             Afib, heart rate is in control, taking Eliquis, Metoprolol. F/u Cardiology             OA R shoulder, X-ray showed DJD, on Tylenol.              MD, recent diagnosed             GERD stable on Famotidine. Hgb 14.3 10/15/22             Depression, GDR Lexapro-off, stable.              Visual hallucination, resolved,  CT head negative, underwent neurology evaluation             Gait abnormality, uses walker, risk of falling.              Mild cognitive impairment, uses walker. 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 Vit B12 465 07/04/22. Saw neurology 10/21/22   Past Medical History:  Diagnosis Date   Atrial fibrillation ([place]) 03/26/2011   Basal cell carcinoma of other specified sites of skin 4/10/012   Basal cell carcinoma of skin, site unspecified 03/25/2012   Benign neoplasm of  colon 03/25/2012   adenomatous polyps   Broken arm 11/25/2017   Colon polyp 2014   Degeneration of lumbar or lumbosacral intervertebral disc 09/24/2011   Disturbance of skin sensation 01/28/2012   Diverticulosis 2014   Dysphagia, unspecified(787.20) 03/26/2011   Elevated prostate specific antigen (PSA) 03/26/2011   Exposure to unspecified radiation 03/26/2011   Family history of colon cancer 07/16/2013   GERD (gastroesophageal reflux disease) 07/16/2013   Hearing deficit 12/21/2013   Hemangioma of unspecified site 03/26/2011   Hx of adenomatous colonic polyps 07/16/2013   Hypertrophy of prostate with urinary obstruction and other lower urinary tract symptoms (LUTS) 03/26/2011   Ingrowing nail 03/26/2011   Internal hemorrhoids without mention of complication 99991111   Keratosis, actinic 01/28/2012   Long term (current) use of anticoagulants 03/26/2011   Long term current use of anticoagulant therapy 07/23/2016   Lumbago 03/26/2011   Neoplasm of uncertain behavior of kidney  and ureter 05/25/2009   Osteoarthrosis, unspecified whether generalized or localized, unspecified site 11/24/2012   Other and unspecified hyperlipidemia 03/26/2011   Other malaise and fatigue 03/26/2011   Pain in joint, forearm 11/24/2012   Pain in joint, lower leg 06/15/13   left knee   Paresthesia of left arm 07/05/2014   Rectal bleed 06/15/13   Reflux esophagitis 03/26/2011   Sacroiliitis, not elsewhere classified ([place]) 05/28/2011   Scoliosis (and kyphoscoliosis), idiopathic 05/28/2011   Squamous cell carcinoma of skin of upper limb, including shoulder 03/26/2011   Tinnitus 12/21/2013   Transient ischemic attack (TIA), 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 Jarome Matin   COLONOSCOPY  08/12/2013   Dr. Hilarie Fredrickson adenomatous polyps   CRYOTHERAPY Left 2009   hand    PHOTODYNAMIC THERAPY  09/26/14   face and ear lobs  Dr. Jarome Matin   SQUAMOUS CELL CARCINOMA EXCISION Left 2010   hand    Allergies  Allergen Reactions   Ciprofloxacin Itching    Patient doesn't like the way it makes him feel   Lisinopril Itching    unknown   Sulfa Antibiotics Rash    Allergies as of 01/30/2023       Reactions   Ciprofloxacin Itching   Patient doesn't like the way it makes him feel   Lisinopril Itching   unknown   Sulfa Antibiotics Rash        Medication List        Accurate as of January 30, 2023 11:59 PM. If you have any questions, ask your nurse or doctor.          acetaminophen 500 MG tablet Commonly known as: TYLENOL Take 500 mg by mouth daily.   amLODipine 2.5 MG tablet Commonly known as: NORVASC Take 2.5 mg by mouth daily.   cloNIDine 0.1 MG tablet Commonly known as: CATAPRES Take 0.1 mg by mouth daily as needed. IF BP GREATER THAN 180/100   Eliquis 5 MG Tabs tablet Generic drug: apixaban TAKE 1 TABLET TWICE A DAY FOR ANTICOAGULATION   famotidine 20 MG tablet Commonly known as: PEPCID Take 20 mg by mouth daily as needed for heartburn or indigestion.   finasteride 5 MG tablet Commonly known as: PROSCAR TAKE 1 TABLET DAILY FOR URINATING DRIBBLING/PROSTATE ENLARGEMENT   levothyroxine 25 MCG tablet Commonly known as: SYNTHROID TAKE 1 TABLET DAILY BEFORE BREAKFAST   metoprolol succinate 50 MG 24 hr tablet Commonly known as: TOPROL-XL TAKE 1 TABLET TWICE A DAY   WITH OR IMMEDIATELY FOLLOWING A MEAL TO CONTROL BLOOD PRESSURE AND HEART RHYTHM   simvastatin 10 MG tablet Commonly known as: ZOCOR TAKE 1 TABLET DAILY TO LOWER CHOLESTEROL   terazosin 2 MG capsule Commonly known as: HYTRIN TAKE 1 CAPSULE DAILY   valsartan 320 MG tablet Commonly known as: DIOVAN Take 320 mg by mouth daily.        Review of Systems:  Review of Systems  Health Maintenance  Topic Date Due   Medicare Annual Wellness (AWV)  03/26/2023    COVID-19 Vaccine (5 - 2023-24 season) 03/17/2023 (Originally 12/12/2022)   DTaP/Tdap/Td (5 - Td or Tdap) 09/04/2026   Pneumonia Vaccine 107+ Years old  Completed   INFLUENZA VACCINE  Completed   Zoster Vaccines- Shingrix  Completed   HPV VACCINES  Aged Out   COLONOSCOPY (Pts 45-32yr Insurance coverage will  need to be confirmed)  Discontinued    Physical Exam: Vitals:   01/30/23 1427 01/30/23 1454  BP: (!) 148/86 (!) 142/80  Pulse: 84   Resp: 18   Temp: (!) 97.5 F (36.4 C)   SpO2: 99%   Weight: 197 lb (89.4 kg)   Height: 5' 7"$  (1.702 m)    Body mass index is 30.85 kg/m. Physical Exam  Labs reviewed: Basic Metabolic Panel: Recent Labs    04/18/22 0850 06/20/22 1529 06/22/22 1557 10/15/22 0730  NA 140 138 139 141  K 3.8 4.6 4.3 4.3  CL 105 104 103 106  CO2 28 24 27 29  $ GLUCOSE 115* 109* 107* 80  BUN 17 14 17 17  $ CREATININE 1.08 1.05 1.18 1.11  CALCIUM 9.6 9.2 9.9 9.1  TSH 4.03  --   --  2.02   Liver Function Tests: Recent Labs    04/18/22 0850 06/20/22 1529 10/15/22 0730  AST 18 19 11  $ ALT 12 12 7*  ALKPHOS  --  79  --   BILITOT 0.5 1.4* 0.7  PROT 6.7 6.8 6.5  ALBUMIN  --  3.5  --    Recent Labs    06/20/22 1529  LIPASE 33   No results for input(s): "AMMONIA" in the last 8760 hours. CBC: Recent Labs    04/18/22 0850 06/20/22 1529 06/22/22 1557 10/15/22 0730  WBC 7.7 13.3* 8.3 6.8  NEUTROABS 5,529 10.9*  --  4,869  HGB 15.2 15.2 14.6 14.3  HCT 44.1 45.3 43.1 41.4  MCV 95.2 96.8 96.9 95.0  PLT 187 175 201 182   Lipid Panel: Recent Labs    04/18/22 0850 10/15/22 0730  CHOL 130 123  HDL 44 50  LDLCALC 65 59  TRIG 120 65  CHOLHDL 3.0 2.5   No results found for: "HGBA1C"  Procedures since last visit: No results found.  Assessment/Plan  Essential hypertension Blood pressure is controlled, on Metoprolol, Valsartan,  Bun/creat 17/1.11 10/15/22  Hyperlipidemia  on Simvastatin, LDL 59 10/15/22  Hypothyroidism on Levothyroxine,  TSH 2.02 10/15/22  BPH with obstruction/lower urinary tract symptoms  urinary frequency, on Terazosin, Proscar, saw Urology 12/19/22  Atrial fibrillation ([place]) heart rate is in control, taking Eliquis, Metoprolol. F/u Cardiology  GERD (gastroesophageal reflux disease)  stable on Famotidine. Hgb 14.3 10/15/22  Depression  GDR Lexapro-off, stable.   Mild cognitive impairment  Mild cognitive impairment, uses walker. 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 Vit B12 465 07/04/22. Saw neurology 10/21/22   Labs/tests ordered:  none  Next appt:  3-4 months with Dr. Sabra Heck

## 2023-01-30 NOTE — Assessment & Plan Note (Signed)
Blood pressure is controlled, on Metoprolol, Valsartan,  Bun/creat 17/1.11 10/15/22

## 2023-01-30 NOTE — Assessment & Plan Note (Signed)
on Simvastatin, LDL 59 10/15/22

## 2023-01-30 NOTE — Assessment & Plan Note (Signed)
Mild cognitive impairment, uses walker. 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 Vit B12 465 07/04/22. Saw neurology 10/21/22

## 2023-01-30 NOTE — Assessment & Plan Note (Signed)
on Levothyroxine, TSH 2.02 10/15/22

## 2023-01-30 NOTE — Assessment & Plan Note (Signed)
urinary frequency, on Terazosin, Proscar, saw Urology 12/19/22

## 2023-01-31 ENCOUNTER — Encounter: Payer: Self-pay | Admitting: Nurse Practitioner

## 2023-02-19 DIAGNOSIS — L570 Actinic keratosis: Secondary | ICD-10-CM | POA: Diagnosis not present

## 2023-02-19 DIAGNOSIS — L820 Inflamed seborrheic keratosis: Secondary | ICD-10-CM | POA: Diagnosis not present

## 2023-02-19 DIAGNOSIS — L812 Freckles: Secondary | ICD-10-CM | POA: Diagnosis not present

## 2023-02-19 DIAGNOSIS — Z85828 Personal history of other malignant neoplasm of skin: Secondary | ICD-10-CM | POA: Diagnosis not present

## 2023-02-19 DIAGNOSIS — L821 Other seborrheic keratosis: Secondary | ICD-10-CM | POA: Diagnosis not present

## 2023-02-25 ENCOUNTER — Encounter: Payer: Self-pay | Admitting: Family

## 2023-02-25 ENCOUNTER — Ambulatory Visit (INDEPENDENT_AMBULATORY_CARE_PROVIDER_SITE_OTHER): Payer: Medicare Other | Admitting: Family

## 2023-02-25 VITALS — BP 124/86 | HR 107 | Temp 98.2°F | Resp 14 | Ht 67.0 in | Wt 198.0 lb

## 2023-02-25 DIAGNOSIS — H6123 Impacted cerumen, bilateral: Secondary | ICD-10-CM | POA: Diagnosis not present

## 2023-02-25 NOTE — Progress Notes (Signed)
Provider: Marlowe Sax FNP-C  [name], [name], NP  Patient Care Team: [name], [name], NP as PCP - General (Internal Medicine) Jarome Matin, MD as Consulting Physician (Dermatology) [place], [facility] Wardell Honour, MD as Attending Physician [facility] Medicine)  Extended Emergency Contact Information Primary Emergency Contact: [name],[name] Address: [place], [place] [address] Johnnette Litter of [place] Phone: [phone] Relation: Daughter Secondary Emergency Contact: Gibson of [place] Mobile Phone: [phone] Relation: Daughter  Code Status:  Full Code  Goals of care: Advanced Directive information    11/27/2022   11:39 AM  Advanced Directives  Does Patient Have a Medical Advance Directive? Yes  Type of Paramedic of [place];Living will  Does patient want to make changes to medical advance directive? No - Patient declined  Copy of [place] in Chart? Yes - validated most recent copy scanned in chart (See row information)     Chief Complaint  Patient presents with   Cerumen Impaction    Wears hearing aids and it was done a month ago-[facility] told him ear full on right side    HPI:  Pt is a 89 y.o. male seen today for an acute visit for evaluation of bilateral cerumen impaction.Here with daughter states went to [employer] to get his hearing Aids.He was told hearing test could not be done due to cerumen impaction. States had bilateral ear lavage last month after using ear drops done by PCP at [facility]. He denies any pain,ringing or drainage from the ears.   Past Medical History:  Diagnosis Date   Atrial fibrillation ([place]) 03/26/2011   Basal cell carcinoma of other specified sites of skin 4/10/012   Basal cell carcinoma of skin, site unspecified 03/25/2012   Benign neoplasm of colon 03/25/2012   adenomatous polyps   Broken arm 11/25/2017   Colon polyp 2014    Degeneration of lumbar or lumbosacral intervertebral disc 09/24/2011   Disturbance of skin sensation 01/28/2012   Diverticulosis 2014   Dysphagia, unspecified(787.20) 03/26/2011   Elevated prostate specific antigen (PSA) 03/26/2011   Exposure to unspecified radiation 03/26/2011   Family history of colon cancer 07/16/2013   GERD (gastroesophageal reflux disease) 07/16/2013   Hearing deficit 12/21/2013   Hemangioma of unspecified site 03/26/2011   Hx of adenomatous colonic polyps 07/16/2013   Hypertrophy of prostate with urinary obstruction and other lower urinary tract symptoms (LUTS) 03/26/2011   Ingrowing nail 03/26/2011   Internal hemorrhoids without mention of complication 99991111   Keratosis, actinic 01/28/2012   Long term (current) use of anticoagulants 03/26/2011   Long term current use of anticoagulant therapy 07/23/2016   Lumbago 03/26/2011   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   Osteoarthrosis, unspecified whether generalized or localized, unspecified site 11/24/2012   Other and unspecified hyperlipidemia 03/26/2011   Other malaise and fatigue 03/26/2011   Pain in joint, forearm 11/24/2012   Pain in joint, lower leg 06/15/13   left knee   Paresthesia of left arm 07/05/2014   Rectal bleed 06/15/13   Reflux esophagitis 03/26/2011   Sacroiliitis, not elsewhere classified ([place]) 05/28/2011   Scoliosis (and kyphoscoliosis), idiopathic 05/28/2011   Squamous cell carcinoma of skin of upper limb, including shoulder 03/26/2011   Tinnitus 12/21/2013   Transient ischemic attack (TIA), and cerebral infarction without residual deficits(V12.54) 03/26/2011   Unspecified essential hypertension 03/26/2011   Past Surgical History:  Procedure  Laterality Date   BASAL CELL CARCINOMA EXCISION Left 12/10/14   Dr. Jarome Matin   COLONOSCOPY  08/12/2013   Dr. Hilarie Fredrickson adenomatous polyps   CRYOTHERAPY Left 2009   hand   PHOTODYNAMIC THERAPY  09/26/14   face and ear lobs  Dr. Jarome Matin   SQUAMOUS CELL  CARCINOMA EXCISION Left 2010   hand    Allergies  Allergen Reactions   Ciprofloxacin Itching    Patient doesn't like the way it makes him feel   Lisinopril Itching    unknown   Sulfa Antibiotics Rash    Outpatient Encounter Medications as of 02/25/2023  Medication Sig   acetaminophen (TYLENOL) 500 MG tablet Take 500 mg by mouth daily.   amLODipine (NORVASC) 2.5 MG tablet Take 2.5 mg by mouth daily.   cloNIDine (CATAPRES) 0.1 MG tablet Take 0.1 mg by mouth daily as needed. IF BP GREATER THAN 180/100   ELIQUIS 5 MG TABS tablet TAKE 1 TABLET TWICE A DAY FOR ANTICOAGULATION   famotidine (PEPCID) 20 MG tablet Take 20 mg by mouth daily as needed for heartburn or indigestion.   finasteride (PROSCAR) 5 MG tablet TAKE 1 TABLET DAILY FOR URINATING DRIBBLING/PROSTATE ENLARGEMENT   levothyroxine (SYNTHROID) 25 MCG tablet TAKE 1 TABLET DAILY BEFORE BREAKFAST   metoprolol succinate (TOPROL-XL) 50 MG 24 hr tablet TAKE 1 TABLET TWICE A DAY   WITH OR IMMEDIATELY FOLLOWING A MEAL TO CONTROL BLOOD PRESSURE AND HEART RHYTHM   simvastatin (ZOCOR) 10 MG tablet TAKE 1 TABLET DAILY TO LOWER CHOLESTEROL   terazosin (HYTRIN) 2 MG capsule TAKE 1 CAPSULE DAILY   valsartan (DIOVAN) 320 MG tablet Take 320 mg by mouth daily.   No facility-administered encounter medications on file as of 02/25/2023.    Review of Systems  Constitutional:  Negative for appetite change, chills, fatigue and fever.  HENT:  Positive for hearing loss. Negative for congestion, ear discharge, ear pain, rhinorrhea, sinus pressure, sore throat and tinnitus.   Musculoskeletal:  Positive for gait problem.  Neurological:  Negative for dizziness, light-headedness and headaches.    Immunization History  Administered Date(s) Administered   Fluad Quad(high Dose 65+) 10/07/2022   Influenza Split 11/18/2007, 10/23/2009, 10/10/2010   Influenza Whole 09/15/2012, 09/16/2013   Influenza, High Dose Seasonal PF 09/24/2017, 09/20/2021    Influenza,inj,Quad PF,6+ Mos 09/17/2018   Influenza-Unspecified 09/29/2014, 09/14/2015, 09/26/2016, 09/05/2020   Moderna Covid-19 Vaccine Bivalent Booster 36yr & up 09/05/2021, 10/17/2022   Moderna SARS-COV2 Booster Vaccination 12/31/2019, 05/23/2021   Moderna Sars-Covid-2 Vaccination 12/20/2019, 01/17/2020   PPD Test 10/10/2010, 11/27/2010   Pneumococcal Conjugate-13 02/08/2016   Pneumococcal Polysaccharide-23 01/17/1996   Pneumococcal-Unspecified 01/17/1996   Td 02/14/2006, 09/04/2016   Td (Adult), 2 Lf Tetanus Toxid, Preservative Free 02/14/2006, 09/04/2016   Tdap 03/05/2006   Zoster Recombinat (Shingrix) 03/31/2018, 06/23/2018   Zoster, Live 03/05/2006   Pertinent  Health Maintenance Due  Topic Date Due   INFLUENZA VACCINE  Completed   COLONOSCOPY (Pts 45-445yrInsurance coverage will need to be confirmed)  Discontinued      06/05/2022    9:31 AM 06/20/2022    3:15 PM 06/22/2022    3:41 PM 07/04/2022    1:24 PM 10/21/2022    9:10 AM  FaLehrn the past year? '1   1 1  '$ Was there an injury with Fall? 0   0 1  Fall Risk Category Calculator '1   2 3  '$ Fall Risk Category (Retired) Low   Moderate High  (RETIRED) Patient  Fall Risk Level Low fall risk Moderate fall risk Moderate fall risk High fall risk High fall risk  Patient at Risk for Falls Due to History of fall(s)      Fall risk Follow up Falls evaluation completed    Falls evaluation completed   Functional Status Survey:    Vitals:   02/25/23 1534  BP: 124/86  Pulse: (!) 107  Resp: 14  Temp: 98.2 F (36.8 C)  TempSrc: Temporal  SpO2: 97%  Weight: 198 lb (89.8 kg)  Height: '5\' 7"'$  (1.702 m)   Body mass index is 31.01 kg/m. Physical Exam Vitals reviewed.  Constitutional:      General: He is not in acute distress.    Appearance: Normal appearance. He is normal weight. He is not ill-appearing or diaphoretic.  HENT:     Head: Normocephalic.     Right Ear: Tympanic membrane, ear canal and external ear normal.  There is impacted cerumen.     Left Ear: Tympanic membrane, ear canal and external ear normal. There is impacted cerumen.     Ears:     Comments: Bilateral ear cerumen lavaged with warm water and hydrogen peroxide moderate amounts of cerumen obtained.Tolerated procedure well.TM clear without any signs of infection.     Nose: Nose normal. No congestion or rhinorrhea.     Mouth/Throat:     Mouth: Mucous membranes are moist.     Pharynx: Oropharynx is clear. No oropharyngeal exudate or posterior oropharyngeal erythema.  Eyes:     General: No scleral icterus.       Right eye: No discharge.        Left eye: No discharge.     Extraocular Movements: Extraocular movements intact.     Conjunctiva/sclera: Conjunctivae normal.     Pupils: Pupils are equal, round, and reactive to light.  Neck:     Vascular: No carotid bruit.  Cardiovascular:     Rate and Rhythm: Normal rate and regular rhythm.     Pulses: Normal pulses.     Heart sounds: Normal heart sounds. No murmur heard.    No friction rub. No gallop.  Pulmonary:     Effort: Pulmonary effort is normal. No respiratory distress.     Breath sounds: Normal breath sounds. No wheezing, rhonchi or rales.  Chest:     Chest wall: No tenderness.  Musculoskeletal:     Cervical back: Normal range of motion. No rigidity or tenderness.  Lymphadenopathy:     Cervical: No cervical adenopathy.  Skin:    General: Skin is warm and dry.     Coloration: Skin is not pale.     Findings: No bruising, erythema, lesion or rash.  Neurological:     Mental Status: He is alert and oriented to person, place, and time. Mental status is at baseline.     Cranial Nerves: No cranial nerve deficit.     Motor: No weakness.     Gait: Gait abnormal.     Comments: HOH   Psychiatric:        Mood and Affect: Mood normal.        Speech: Speech normal.        Behavior: Behavior normal.     Labs reviewed: Recent Labs    06/20/22 1529 06/22/22 1557 10/15/22 0730  NA  138 139 141  K 4.6 4.3 4.3  CL 104 103 106  CO2 '24 27 29  '$ GLUCOSE 109* 107* 80  BUN '14 17 17  '$ CREATININE 1.05 1.18  1.11  CALCIUM 9.2 9.9 9.1   Recent Labs    04/18/22 0850 06/20/22 1529 10/15/22 0730  AST '18 19 11  '$ ALT 12 12 7*  ALKPHOS  --  79  --   BILITOT 0.5 1.4* 0.7  PROT 6.7 6.8 6.5  ALBUMIN  --  3.5  --    Recent Labs    04/18/22 0850 06/20/22 1529 06/22/22 1557 10/15/22 0730  WBC 7.7 13.3* 8.3 6.8  NEUTROABS 5,529 10.9*  --  4,869  HGB 15.2 15.2 14.6 14.3  HCT 44.1 45.3 43.1 41.4  MCV 95.2 96.8 96.9 95.0  PLT 187 175 201 182   Lab Results  Component Value Date   TSH 2.02 10/15/2022   No results found for: "HGBA1C" Lab Results  Component Value Date   CHOL 123 10/15/2022   HDL 50 10/15/2022   LDLCALC 59 10/15/2022   TRIG 65 10/15/2022   CHOLHDL 2.5 10/15/2022    Significant Diagnostic Results in last 30 days:  No results found.  Assessment/Plan Bilateral impacted cerumen Bilateral ear cerumen lavaged with warm water and hydrogen peroxide moderate amounts of cerumen obtained.Tolerated procedure well.TM clear without any signs of infection. - may schedule appointment for hearing test at [facility] as planned.   Family/ staff Communication: Reviewed plan of care with patient and daughter verbalized understanding.  Labs/tests ordered: None   Next Appointment:Return if symptoms worsen or fail to improve.   Sandrea Hughs, NP

## 2023-03-10 ENCOUNTER — Other Ambulatory Visit: Payer: Self-pay

## 2023-03-10 ENCOUNTER — Other Ambulatory Visit: Payer: Self-pay | Admitting: Internal Medicine

## 2023-03-10 MED ORDER — AMLODIPINE BESYLATE 2.5 MG PO TABS: 2.5000 mg | ORAL_TABLET | Freq: Every day | ORAL | 1 refills | Status: DC

## 2023-03-10 NOTE — Telephone Encounter (Signed)
Refill request received from pharmacy °

## 2023-03-17 ENCOUNTER — Other Ambulatory Visit: Payer: Self-pay

## 2023-03-17 MED ORDER — FAMOTIDINE 20 MG PO TABS: 20.0000 mg | ORAL_TABLET | Freq: Every day | ORAL | 1 refills | Status: DC | PRN

## 2023-03-17 MED ORDER — CLONIDINE HCL 0.1 MG PO TABS: 0.1000 mg | ORAL_TABLET | Freq: Every day | ORAL | 5 refills | Status: DC | PRN

## 2023-03-20 ENCOUNTER — Ambulatory Visit (INDEPENDENT_AMBULATORY_CARE_PROVIDER_SITE_OTHER): Payer: Medicare Other | Admitting: Nurse Practitioner

## 2023-03-20 ENCOUNTER — Other Ambulatory Visit: Payer: Self-pay | Admitting: Internal Medicine

## 2023-03-20 ENCOUNTER — Encounter: Payer: Self-pay | Admitting: Nurse Practitioner

## 2023-03-20 VITALS — BP 136/76 | HR 68 | Temp 97.8°F | Resp 18 | Ht 67.0 in | Wt 194.0 lb

## 2023-03-20 DIAGNOSIS — Z0000 Encounter for general adult medical examination without abnormal findings: Secondary | ICD-10-CM | POA: Diagnosis not present

## 2023-03-20 DIAGNOSIS — K219 Gastro-esophageal reflux disease without esophagitis: Secondary | ICD-10-CM | POA: Diagnosis not present

## 2023-03-20 NOTE — Progress Notes (Signed)
Subjective:   Phillip [name] is a 89 y.o. male who presents for Medicare Annual/Subsequent preventive examination @ [facility].        Objective:    There were no vitals filed for this visit. There is no height or weight on file to calculate BMI.     11/27/2022   11:39 AM 10/21/2022    9:10 AM 07/04/2022    1:24 PM 06/28/2022   12:26 PM 06/25/2022   10:36 AM 06/22/2022    3:41 PM 05/28/2022    2:48 PM  Advanced Directives  Does Patient Have a Medical Advance Directive? Yes Yes Yes No No;Yes No No;Yes  Type of Paramedic of [place];Living will    Living will  Living will  Does patient want to make changes to medical advance directive? No - Patient declined   No - Patient declined No - Patient declined  No - Patient declined  Copy of [place] in Chart? Yes - validated most recent copy scanned in chart (See row information)          Current Medications (verified) Outpatient Encounter Medications as of 03/20/2023  Medication Sig   acetaminophen (TYLENOL) 500 MG tablet Take 500 mg by mouth daily.   amLODipine (NORVASC) 2.5 MG tablet Take 1 tablet (2.5 mg total) by mouth daily.   cloNIDine (CATAPRES) 0.1 MG tablet Take 1 tablet (0.1 mg total) by mouth daily as needed. IF BP GREATER THAN 180/100   ELIQUIS 5 MG TABS tablet TAKE 1 TABLET TWICE A DAY FOR ANTICOAGULATION   famotidine (PEPCID) 20 MG tablet Take 1 tablet (20 mg total) by mouth daily as needed for heartburn or indigestion.   finasteride (PROSCAR) 5 MG tablet TAKE 1 TABLET DAILY FOR URINATING DRIBBLING/PROSTATE ENLARGEMENT   levothyroxine (SYNTHROID) 25 MCG tablet TAKE 1 TABLET DAILY BEFORE BREAKFAST   metoprolol succinate (TOPROL-XL) 50 MG 24 hr tablet TAKE 1 TABLET TWICE A DAY   WITH OR IMMEDIATELY FOLLOWING A MEAL TO CONTROL BLOOD PRESSURE AND HEART RHYTHM   simvastatin (ZOCOR) 10 MG tablet TAKE 1 TABLET DAILY TO LOWER CHOLESTEROL   terazosin (HYTRIN) 2 MG capsule TAKE 1  CAPSULE DAILY   valsartan (DIOVAN) 320 MG tablet Take 320 mg by mouth daily.   No facility-administered encounter medications on file as of 03/20/2023.    Allergies (verified) Ciprofloxacin, Lisinopril, and Sulfa antibiotics   History: Past Medical History:  Diagnosis Date   Atrial fibrillation ([place]) 03/26/2011   Basal cell carcinoma of other specified sites of skin 4/10/012   Basal cell carcinoma of skin, site unspecified 03/25/2012   Benign neoplasm of colon 03/25/2012   adenomatous polyps   Broken arm 11/25/2017   Colon polyp 2014   Degeneration of lumbar or lumbosacral intervertebral disc 09/24/2011   Disturbance of skin sensation 01/28/2012   Diverticulosis 2014   Dysphagia, unspecified(787.20) 03/26/2011   Elevated prostate specific antigen (PSA) 03/26/2011   Exposure to unspecified radiation 03/26/2011   Family history of colon cancer 07/16/2013   GERD (gastroesophageal reflux disease) 07/16/2013   Hearing deficit 12/21/2013   Hemangioma of unspecified site 03/26/2011   Hx of adenomatous colonic polyps 07/16/2013   Hypertrophy of prostate with urinary obstruction and other lower urinary tract symptoms (LUTS) 03/26/2011   Ingrowing nail 03/26/2011   Internal hemorrhoids without mention of complication 99991111   Keratosis, actinic 01/28/2012   Long term (current) use of anticoagulants 03/26/2011   Long term current use of anticoagulant therapy 07/23/2016  Lumbago 03/26/2011   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   Osteoarthrosis, unspecified whether generalized or localized, unspecified site 11/24/2012   Other and unspecified hyperlipidemia 03/26/2011   Other malaise and fatigue 03/26/2011   Pain in joint, forearm 11/24/2012   Pain in joint, lower leg 06/15/13   left knee   Paresthesia of left arm 07/05/2014   Rectal bleed 06/15/13   Reflux esophagitis 03/26/2011   Sacroiliitis, not elsewhere classified ([place]) 05/28/2011   Scoliosis (and kyphoscoliosis), idiopathic 05/28/2011    Squamous cell carcinoma of skin of upper limb, including shoulder 03/26/2011   Tinnitus 12/21/2013   Transient ischemic attack (TIA), and cerebral infarction without residual deficits(V12.54) 03/26/2011   Unspecified essential hypertension 03/26/2011   Past Surgical History:  Procedure Laterality Date   BASAL CELL CARCINOMA EXCISION Left 12/10/14   Dr. Jarome Matin   COLONOSCOPY  08/12/2013   Dr. Hilarie Fredrickson adenomatous polyps   CRYOTHERAPY Left 2009   hand   PHOTODYNAMIC THERAPY  09/26/14   face and ear lobs  Dr. 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    Employer: RETIRED  Tobacco Use   Smoking status: Never   Smokeless tobacco: Never  Vaping Use   Vaping Use: Never used  Substance and Sexual Activity   Alcohol use: No   Drug use: No   Sexual activity: Not on file  Other Topics Concern   Not on file  Social History Narrative   [facility] since 11/[facility]   Married - [employer]   Does not have Living Will   Never smoked   Alcohol none   Exercise: tennis, pool, table tennis 2-3 times a week   Several daughters   Social Determinants of Health   Financial Resource Strain: Low Risk  (03/25/2022)   Overall Financial Resource Strain (CARDIA)    Difficulty of Paying Living Expenses: Not hard at all  Food Insecurity: No Food Insecurity (03/25/2022)   Hunger Vital Sign    Worried About Estate manager/land agent of Food in the Last Year: Never true    [place] in the Last Year: Never true  Transportation Needs: No Transportation Needs (03/25/2022)   PRAPARE -  Hydrologist (Medical): No    Lack of Transportation (Non-Medical): No  Physical Activity: Inactive (03/25/2022)   Exercise Vital Sign    Days of Exercise per Week: 0 days    Minutes of Exercise per Session: 0 min  Stress: No Stress Concern Present (03/25/2022)   [place]    Feeling of Stress : Only a little  Social Connections: Moderately Isolated (03/25/2022)   Social Connection and Isolation Panel [NHANES]    Frequency of Communication with Friends and Family: More than three times a week    Frequency of Social Gatherings with Friends and Family: More than three times a week  Attends Religious Services: Never    Active Member of Clubs or Organizations: No    Attends Archivist Meetings: Never    Marital Status: Married    Tobacco Counseling Counseling given: Not Answered   Clinical Intake:  Pre-visit preparation completed: Yes  Pain : No/denies pain     Nutritional Status: BMI 25 -29 Overweight Nutritional Risks: Unintentional weight loss Diabetes: No  How often do you need to have someone help you when you read instructions, pamphlets, or other written materials from your doctor or pharmacy?: 3 - Sometimes What is the last grade level you completed in school?: college  Diabetic?none  Interpreter Needed?: No  Information entered by :: Rickard Kennerly Bretta Bang NP   Activities of Daily Living    03/25/2022    3:45 PM  In your present state of health, do you have any difficulty performing the following activities:  Vision? 0  Difficulty concentrating or making decisions? 0  Walking or climbing stairs? 0  Dressing or bathing? 0  Doing errands, shopping? 0  Preparing Food and eating ? N  Using the Toilet? N  In the past six months, have you accidently leaked urine? N  Do you have problems with loss of bowel control? N  Managing your Medications? N  Managing your  Finances? N  Housekeeping or managing your Housekeeping? N    Patient Care Team: Pancho Rushing X, NP as PCP - General (Internal Medicine) Jarome Matin, MD as Consulting Physician (Dermatology) [place], [facility] Wardell Honour, MD as Attending Physician (Family Medicine)  Indicate any recent Medical Services you may have received from other than [facility] providers in the past year (date may be approximate).     Assessment:   This is a routine wellness examination for Phillip [name].  Hearing/Vision screen No results found.  Dietary issues and exercise activities discussed:     Goals Addressed             This Visit's Progress    Stay Active and Independent-Low Back Pain       Timeframe:  Long-Range Goal Priority:  High Start Date:                             Expected End Date:                       Follow Up Date 03/19/2024   - check with my doctor before adding more exercise than I usually do    Why is this important?   Regular activity or exercise is important to managing back pain.  Activity helps to keep your muscles strong.  You will sleep better and feel more relaxed.  You will have more energy and feel less stressed.  If you are not active now, start slowly. Little changes make a big difference.  Rest, but not too much.  Stay as active as you can and listen to your body's signals.     Notes:        Depression Screen    05/28/2022    2:48 PM 03/25/2022    3:22 PM 03/18/2018    2:47 PM 10/01/2017    9:54 AM 07/23/2016    9:36 AM 01/12/2015    2:41 PM 07/05/2014   10:33 AM  PHQ 2/9 Scores  PHQ - 2 Score 0 0 0 0 0 0 0    Fall Risk    10/21/2022  9:10 AM 07/04/2022    1:24 PM 06/05/2022    9:31 AM 05/28/2022    2:48 PM 04/24/2022    1:45 PM  Fall Risk   Falls in the past year? 0 0  Number falls in past yr: 1 1 0 0 0  Injury with Fall? 1 0 0 0 0  Risk for fall due to :   History of fall(s) No Fall Risks No Fall Risks  Follow up Falls evaluation completed   Falls evaluation completed Falls evaluation completed Falls evaluation completed    FALL RISK PREVENTION PERTAINING TO THE HOME:  Any stairs in or around the home? Yes  If so, are there any without handrails? No  Home free of loose throw rugs in walkways, pet beds, electrical cords, etc? Yes  Adequate lighting in your home to reduce risk of falls? Yes   ASSISTIVE DEVICES UTILIZED TO PREVENT FALLS:  Life alert? No  Use of a cane, walker or w/c? Yes  Grab bars in the bathroom? Yes  Shower chair or bench in shower? Yes  Elevated toilet seat or a handicapped toilet? No   TIMED UP AND GO:  Was the test performed? Yes .  Length of time to ambulate 10 feet: 12 sec.   Gait slow and steady with assistive device  Cognitive Function:    07/04/2022    3:00 PM 03/25/2022    3:23 PM 01/28/2018   11:36 AM  MMSE - Mini Mental State Exam  Orientation to time Orientation to Place Registration Attention/ Calculation Recall 1 0 3  Language- name 2 objects Language- repeat Language- follow 3 step command Language- read & follow direction Write a sentence Copy design 1 0 1  Total score Immunizations Immunization History  Administered Date(s) Administered   Fluad Quad(high Dose 65+) 10/07/2022   Influenza Split 11/18/2007, 10/23/2009, 10/10/2010   Influenza Whole 09/15/2012, 09/16/2013   Influenza, High Dose Seasonal PF 09/24/2017, 09/20/2021   Influenza,inj,Quad PF,6+ Mos 09/17/2018   Influenza-Unspecified 09/29/2014, 09/14/2015, 09/26/2016, 09/05/2020   Moderna Covid-19 Vaccine Bivalent Booster 23yrs & up 09/05/2021, 10/17/2022   Moderna SARS-COV2 Booster Vaccination 12/31/2019, 05/23/2021   Moderna Sars-Covid-2 Vaccination 12/20/2019, 01/17/2020   PPD Test 10/10/2010, 11/27/2010   Pneumococcal Conjugate-13 02/08/2016   Pneumococcal Polysaccharide-23 01/17/1996   Pneumococcal-Unspecified 01/17/1996    Td 02/14/2006, 09/04/2016   Td (Adult), 2 Lf Tetanus Toxid, Preservative Free 02/14/2006, 09/04/2016   Tdap 03/05/2006   Zoster Recombinat (Shingrix) 03/31/2018, 06/23/2018   Zoster, Live 03/05/2006    TDAP status: Up to date  Flu Vaccine status: Up to date  Pneumococcal vaccine status: Up to date  Covid-19 vaccine status: Completed vaccines  Qualifies for Shingles Vaccine? Yes   Zostavax completed Yes   Shingrix Completed?: Yes  Screening Tests Health Maintenance  Topic Date Due   COVID-19 Vaccine (5 - 2023-24 season) 12/12/2022   INFLUENZA VACCINE  07/17/2023   Medicare Annual Wellness (AWV)  03/19/2024   DTaP/Tdap/Td (5 - Td or Tdap) 09/04/2026   Pneumonia Vaccine 42+ Years old  Completed   Zoster Vaccines- Shingrix  Completed   HPV VACCINES  Aged Out   COLONOSCOPY (Pts 45-58yrs Insurance coverage will need to be confirmed)  Discontinued  Health Maintenance  Health Maintenance Due  Topic Date Due   COVID-19 Vaccine (5 - 2023-24 season) 12/12/2022    Colorectal cancer screening: No longer required.   Lung Cancer Screening: (Low Dose CT Chest recommended if Age 78-80 years, 30 pack-year currently smoking OR have quit w/in 15years.) does not qualify.    Additional Screening:  Hepatitis C Screening: does not qualify; Completed   Vision Screening: Recommended annual ophthalmology exams for early detection of glaucoma and other disorders of the eye. Is the patient up to date with their annual eye exam?  Yes  Who is the provider or what is the name of the office in which the patient attends annual eye exams? Dr. Richelle Ito If pt is not established with a provider, would they like to be referred to a provider to establish care? No .   Dental Screening: Recommended annual dental exams for proper oral hygiene  Community Resource Referral / Chronic Care Management: CRR required this visit?  No   CCM required this visit?  No      Plan:     I have personally  reviewed and noted the following in the patient's chart:   Medical and social history Use of alcohol, tobacco or illicit drugs  Current medications and supplements including opioid prescriptions. Patient is not currently taking opioid prescriptions. Functional ability and status Nutritional status Physical activity Advanced directives List of other physicians Hospitalizations, surgeries, and ER visits in previous 12 months Vitals Screenings to include cognitive, depression, and falls Referrals and appointments  In addition, I have reviewed and discussed with patient certain preventive protocols, quality metrics, and best practice recommendations. A written personalized care plan for preventive services as well as general preventive health recommendations were provided to patient.     Raffaele Derise X Elle Vezina, NP   03/20/2023

## 2023-03-26 DIAGNOSIS — R1312 Dysphagia, oropharyngeal phase: Secondary | ICD-10-CM | POA: Diagnosis not present

## 2023-03-31 DIAGNOSIS — R1312 Dysphagia, oropharyngeal phase: Secondary | ICD-10-CM | POA: Diagnosis not present

## 2023-04-02 DIAGNOSIS — R1312 Dysphagia, oropharyngeal phase: Secondary | ICD-10-CM | POA: Diagnosis not present

## 2023-04-04 DIAGNOSIS — R1312 Dysphagia, oropharyngeal phase: Secondary | ICD-10-CM | POA: Diagnosis not present

## 2023-04-09 DIAGNOSIS — R1312 Dysphagia, oropharyngeal phase: Secondary | ICD-10-CM | POA: Diagnosis not present

## 2023-04-11 DIAGNOSIS — R1312 Dysphagia, oropharyngeal phase: Secondary | ICD-10-CM | POA: Diagnosis not present

## 2023-04-16 DIAGNOSIS — R1312 Dysphagia, oropharyngeal phase: Secondary | ICD-10-CM | POA: Diagnosis not present

## 2023-04-18 DIAGNOSIS — R1312 Dysphagia, oropharyngeal phase: Secondary | ICD-10-CM | POA: Diagnosis not present

## 2023-04-21 ENCOUNTER — Other Ambulatory Visit: Payer: Self-pay | Admitting: Nurse Practitioner

## 2023-04-23 DIAGNOSIS — R1312 Dysphagia, oropharyngeal phase: Secondary | ICD-10-CM | POA: Diagnosis not present

## 2023-04-25 DIAGNOSIS — R1312 Dysphagia, oropharyngeal phase: Secondary | ICD-10-CM | POA: Diagnosis not present

## 2023-04-30 DIAGNOSIS — R1312 Dysphagia, oropharyngeal phase: Secondary | ICD-10-CM | POA: Diagnosis not present

## 2023-05-06 DIAGNOSIS — R1312 Dysphagia, oropharyngeal phase: Secondary | ICD-10-CM | POA: Diagnosis not present

## 2023-05-07 ENCOUNTER — Ambulatory Visit (INDEPENDENT_AMBULATORY_CARE_PROVIDER_SITE_OTHER): Payer: Medicare Other | Admitting: Physician Assistant

## 2023-05-07 ENCOUNTER — Encounter: Payer: Self-pay | Admitting: Physician Assistant

## 2023-05-07 VITALS — BP 165/95 | HR 74 | Resp 20 | Ht 67.0 in | Wt 192.0 lb

## 2023-05-07 DIAGNOSIS — G3184 Mild cognitive impairment, so stated: Secondary | ICD-10-CM

## 2023-05-07 DIAGNOSIS — H547 Unspecified visual loss: Secondary | ICD-10-CM

## 2023-05-07 NOTE — Progress Notes (Signed)
Assessment/Plan:   Mild cognitive impairment likely due to Alzheimer's disease and vascular etiology  Phillip [name] is a very pleasant 89 y.o. 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 Patient is not on antidementia medications given that the risks outweigh the benefits of the medicine  Patient's memory is stable. He is able to participate in activities at ALF as tolerated. He no longer drives. Had a discussion with the patient and his daughter about his future follow up appointments with [place], and agreed that he can return to [place] on as needed basis. Of note, patient would like a referral for vision difficulties due to possible macular degeneration, within the [facility], will proceed with this referral    Recommendations:   Follow up as needed No antidementia medication is indicated, as the risks out way good the benefits Recommend good control of cardiovascular risk factors Continue to control mood as per PCP Referral to ophthalmology for vision difficulties likely due to macular degeneration     Subjective:   This patient is accompanied in the office by his daughter  who supplements the history. Previous records as well as any outside records available were reviewed prior to todays visit.   Patient was last seen on 10/21/2022.  Last MMSE was 27/30.     Any changes in memory since last visit? Patient has some difficulty remembering recent conversations and people names.  He may forget a couple of appts. Makes a list of people. "Given up on crossword puzzles due to macular degeneration". repeats oneself?  Endorsed Disoriented when walking into a room?  Patient denies   Leaving objects in unusual places?  Patient denies   Wandering behavior?    denies   Any personality changes since last visit?   denies   Any worsening depression?: denies   Hallucinations or paranoia?  Due to decrease vision , he may have distorted images.  Seizures?   denies    Any sleep changes?  Denies  vivid dreams, REM behavior or sleepwalking   Sleep apnea?   denies   Any hygiene concerns?   denies   Independent of bathing and dressing?  Endorsed  Does the patient needs help with medications?  ALF is in charge   Who is in charge of the finances?  Daughter is in charge     Any changes in appetite?  denies     Patient have trouble swallowing?  denies   Does the patient cook?  No  Any headaches?    denies   Vision changes? HE reports a diagnosis of MG but cannot find sufficient date. HE reports that was seen by "someone outside of [facility]"-but cannot tell the name. He states that sometimes he may see "shadows in the periphery, some weird forms,but he is aware that this may be due to MG and not frank hallucination".  Chronic back pain  denies   Ambulates with difficulty?  Uses a walker for stability. Recent falls or head injuries?    denies     Unilateral weakness, numbness or tingling?   denies   Any tremors?  denies   Any anosmia?    denies   Any incontinence of urine?  Endorsed, he does not wear diapers.  He has a history of BPH with prior urine  retention. Any bowel dysfunction?  Sometimes he has some diarrhea. Patient lives at ALF at [facility]  Does the patient drive?no longer drives   Initial evaluation 06/2022  How long did patient have memory difficulties?  Initially the patient denied.  Then on questioning, he stated that "perhaps this has been going on for a year ".  On further questioning, his daughters report that indeed, may be for a couple of years, he has been in cognitive decline, especially with remembering words and short-term memory especially over the last 2 months.  Long-term memory is good.  Patient admits that has been hard for him  talking about Alzheimer's, sees wife is in a memory care facility with Alzheimer's disease; his daughters suspect that he may have been minimizing the symptoms because of this.. Patient lives in ALF.  Until hospitalization for AMS, the patient was leaning in independent living facility, but after the hospitalization he was moved to ALF. Repeats oneself? Denies  Disoriented when walking into a room?  Patient denies   Leaving objects in unusual places?  Patient denies   Ambulates  with difficulty?   Patient denies   Recent falls?  He had a recent fall after missing the toilet seat, and hitting his back.  Any head injuries?  Patient denies   History of seizures?  He is not aware.  He has a daughter with a history of seizures.  However, he had an episode of bedwetting on 06/30/2022, which is of concern.  He also had episodes of "hearing music ".  He denies any other symptoms such as bloating his lips or tongue, muscle aches after waking up, body jerks etc. Wandering behavior?  Patient denies   Patient drives?   Patient no longer drives due to macular degeneration  Any mood changes such irritability agitation?  Patient denies   Any history of depression?:  His wife lives in a memory care facility for the last year.  He has been more depressed over the last 2 months given his recent health issues and his wife being in a different place.  He was started on Lexapro 2 weeks ago by PCP.  Hallucinations?  He is daughters report that he was in his usual state of health, until he experienced UTI on 06/14/2022.  At that point, he was prescribed cephalexin, and a week later, he began to have hallucinations.  Of note, the patient drinks minimal amount of water, and was apparently dehydrated.  On 06/23/2022, he still experience hallucinations, and his eyes were closed.  Symptoms began to resolve as of 06/24/2022.  Throughout this time, he continued to experience visual distortion, such as shape shifting, and hearing music or  hearing and air conditioner when it was not there.  As of 06/26/2022, he began to be symptom-free, however on 06/30/2022, he had an overnight urine event as described by his daughters.  No hallucination over the last couple of days.   Patient reports that he sleeps well without vivid dreams, REM behavior or sleepwalking    History of sleep apnea?  Patient denies   Any hygiene concerns?  Patient denies   Independent of bathing and dressing?  Endorsed  Does the patient needs help with medications?  ALF monitors Who is in charge of the finances?  Daughter is that in charge. Any changes in appetite?  Patient denies   Patient have trouble swallowing? Patient denies   Does the patient cook?  Patient denies   Any kitchen accidents such as leaving  the stove on? Patient denies   Any headaches?  Patient denies   Double vision? Patient denies   Any focal numbness or tingling?  Patient denies   Chronic back pain Patient denies   Unilateral weakness?  Patient denies   Any tremors?  Patient denies   Any history of anosmia?  Patient denies   Any incontinence of urine?  Endorsed, he does not wear diapers.  He had 1 "accident", on 06/30/2022.  Recent UTI.  He also has a history of BPH with prior urine retention. Any bowel dysfunction?  Sometimes he experiences diarrhea History of heavy alcohol intake?  Patient denies   History of heavy tobacco use?  Patient denies   Family history of dementia?  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)   Personally reviewed CT head  7/6/203 remarkable for chronic microvascular disease and cerebral atrophy without acute findings   Pertinent labs  TSH 4.03, B12 465    Past Medical History:  Diagnosis Date   Atrial fibrillation (HCC) 03/26/2011   Basal cell carcinoma of other specified sites of skin 4/10/012   Basal cell carcinoma of skin, site unspecified 03/25/2012   Benign neoplasm of colon 03/25/2012   adenomatous polyps   Broken arm  11/25/2017   Colon polyp 2014   Degeneration of lumbar or lumbosacral intervertebral disc 09/24/2011   Disturbance of skin sensation 01/28/2012   Diverticulosis 2014   Dysphagia, unspecified(787.20) 03/26/2011   Elevated prostate specific antigen (PSA) 03/26/2011   Exposure to unspecified radiation 03/26/2011   Family history of colon cancer 07/16/2013   GERD (gastroesophageal reflux disease) 07/16/2013   Hearing deficit 12/21/2013   Hemangioma of unspecified site 03/26/2011   Hx of adenomatous colonic polyps 07/16/2013   Hypertrophy of prostate with urinary obstruction and other lower urinary tract symptoms (LUTS) 03/26/2011   Ingrowing nail 03/26/2011   Internal hemorrhoids without mention of complication 06/15/13   Keratosis, actinic 01/28/2012   Long term (current) use of anticoagulants 03/26/2011   Long term current use of anticoagulant therapy 07/23/2016   Lumbago 03/26/2011   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   Osteoarthrosis, unspecified whether generalized or localized, unspecified site 11/24/2012   Other and unspecified hyperlipidemia 03/26/2011   Other malaise and fatigue 03/26/2011   Pain in joint, forearm 11/24/2012   Pain in joint, lower leg 06/15/13   left knee   Paresthesia of left arm 07/05/2014   Rectal bleed 06/15/13   Reflux esophagitis 03/26/2011   Sacroiliitis, not elsewhere classified (HCC) 05/28/2011   Scoliosis (and kyphoscoliosis), idiopathic 05/28/2011   Squamous cell carcinoma of skin of upper limb, including shoulder 03/26/2011   Tinnitus 12/21/2013   Transient ischemic attack (TIA), and cerebral infarction without residual deficits(V12.54) 03/26/2011   Unspecified essential hypertension 03/26/2011     Past Surgical History:  Procedure Laterality Date   BASAL CELL CARCINOMA EXCISION Left 12/10/14   Dr. [name]   COLONOSCOPY  08/12/2013   Dr. Rhea Belton adenomatous polyps   CRYOTHERAPY Left 2009   hand   PHOTODYNAMIC THERAPY  09/26/14   face and ear lobs   Dr. [name]   SQUAMOUS CELL CARCINOMA EXCISION Left 2010   hand     PREVIOUS MEDICATIONS:   CURRENT MEDICATIONS:  Outpatient Encounter Medications as of 05/07/2023  Medication Sig   acetaminophen (TYLENOL) 500 MG tablet Take 500 mg by mouth daily.   amLODipine (NORVASC) 2.5 MG tablet Take 1 tablet (2.5 mg total) by mouth  daily.   cloNIDine (CATAPRES) 0.1 MG tablet Take 1 tablet (0.1 mg total) by mouth daily as needed. IF BP GREATER THAN 180/100   ELIQUIS 5 MG TABS tablet TAKE 1 TABLET TWICE A DAY FOR ANTICOAGULATION   famotidine (PEPCID) 20 MG tablet Take 1 tablet (20 mg total) by mouth daily as needed for heartburn or indigestion.   finasteride (PROSCAR) 5 MG tablet TAKE 1 TABLET DAILY FOR URINATING DRIBBLING/PROSTATE ENLARGEMENT   levothyroxine (SYNTHROID) 25 MCG tablet TAKE 1 TABLET DAILY BEFORE BREAKFAST   metoprolol succinate (TOPROL-XL) 50 MG 24 hr tablet TAKE 1 TABLET TWICE A DAY   WITH OR IMMEDIATELY FOLLOWING A MEAL TO CONTROL BLOOD PRESSURE AND HEART RHYTHM   simvastatin (ZOCOR) 10 MG tablet TAKE 1 TABLET DAILY TO LOWER CHOLESTEROL   terazosin (HYTRIN) 2 MG capsule TAKE 1 CAPSULE DAILY   valsartan (DIOVAN) 320 MG tablet Take 320 mg by mouth daily.   No facility-administered encounter medications on file as of 05/07/2023.     Objective:     PHYSICAL EXAMINATION:    VITALS:   Vitals:   05/07/23 1446 05/07/23 1544  BP: (!) 149/99 (!) 165/95  Pulse: 74   Resp: 20   SpO2: 98%   Weight: 192 lb (87.1 kg)   Height: 5\' 7"  (1.702 m)     GEN:  The patient appears stated age and is in NAD. HEENT:  Normocephalic, atraumatic.   Neurological examination:  General: NAD, well-groomed, appears stated age. Orientation: The patient is alert. Oriented to person, place and date Cranial nerves: There is good facial symmetry.The speech is fluent and clear. No aphasia or dysarthria. Fund of knowledge is appropriate. Recent memory impaired and remote memory is normal.  Attention  and concentration are normal.  Able to name objects and repeat phrases.  Hearing is intact to conversational tone.  Sensation: Sensation is intact to light touch throughout Motor: Strength is at least antigravity x4. Tremors: none  DTR's 2/4 in UE/LE       No data to display             07/04/2022    3:00 PM 03/25/2022    3:23 PM 01/28/2018   11:36 AM  MMSE - Mini Mental State Exam  Orientation to time 5 5 4   Orientation to Place 5 5 5   Registration 3 3 3   Attention/ Calculation 4 3 5   Recall 1 0 3  Language- name 2 objects 2 2 2   Language- repeat 1 1 1   Language- follow 3 step command 3 3 3   Language- read & follow direction 1 1 1   Write a sentence 1 1 1   Copy design 1 0 1  Total score 27 24 29        Movement examination: Tone: There is normal tone in the UE/LE Abnormal movements:  no tremor.  No myoclonus.  No asterixis.   Coordination:  There is no decremation with RAM's. Normal finger to nose  Gait and Station: The patient has some difficulty arising out of a deep-seated chair without the use of the hands. Needs walker to ambulate. The patient's stride length is good.  Gait is cautious and narrow.   Thank you for allowing [place] the opportunity to participate in the care of this nice patient. Please do not hesitate to contact us for any questions or concerns.   Total time spent on today's visit was 31 minutes dedicated to this patient today, preparing to see patient, examining the patient, ordering tests and/or  medications and counseling the patient, documenting clinical information in the EHR or other health record, independently interpreting results and communicating results to the patient/family, discussing treatment and goals, answering patient's questions and coordinating care.  Cc:  [name], [name], NP  Marlowe Kays 05/07/2023 4:53 PM

## 2023-05-07 NOTE — Patient Instructions (Addendum)
It was a pleasure to see you today at our office.   Recommendations:  Follow upas needed Referral to ohthalmology  Make sure to have PCP control the mood issues Make sure to drink water  Continue PT, ST    Whom to call:  Memory  decline, memory medications: Call our office [phone]   For psychiatric meds, mood meds: Please have your primary care physician manage these medications.   Counseling regarding caregiver distress, including caregiver depression, anxiety and issues regarding community resources, adult day care programs, adult living facilities, or memory care questions:   Feel free to contact Misty Lisabeth Register, Social Worker at [phone]   For assessment of decision of mental capacity and competency:  Call Dr. Erick Blinks, geriatric psychiatrist at [phone]  For guidance in geriatric dementia issues please call [facility] [phone]  For guidance regarding [facility] and if placement were needed at the facility, contact Sidney Ace, Social Worker tel: [phone]  If you have any severe symptoms of a stroke, or other severe issues such as confusion,severe chills or fever, etc call 911 or go to the ER as you may need to be evaluated further     RECOMMENDATIONS FOR ALL PATIENTS WITH MEMORY PROBLEMS: 1. Continue to exercise (Recommend 30 minutes of walking everyday, or 3 hours every week) 2. Increase social interactions - continue going to [place] and enjoy social gatherings with friends and family 3. Eat healthy, avoid fried foods and eat more fruits and vegetables 4. Maintain adequate blood pressure, blood sugar, and blood cholesterol level. Reducing the risk of stroke and cardiovascular disease also helps promoting better memory. 5. Avoid stressful situations. Live a simple life and avoid aggravations. Organize your time and prepare for the next day in anticipation. 6. Sleep well, avoid any interruptions of sleep  and avoid any distractions in the bedroom that may interfere with adequate sleep quality 7. Avoid sugar, avoid sweets as there is a strong link between excessive sugar intake, diabetes, and cognitive impairment We discussed the Mediterranean diet, which has been shown to help patients reduce the risk of progressive memory disorders and reduces cardiovascular risk. This includes eating fish, eat fruits and green leafy vegetables, nuts like almonds and hazelnuts, walnuts, and also use olive oil. Avoid fast foods and fried foods as much as possible. Avoid sweets and sugar as sugar use has been linked to worsening of memory function.  There is always a concern of gradual progression of memory problems. If this is the case, then we may need to adjust level of care according to patient needs. Support, both to the patient and caregiver, should then be put into place.    The Alzheimer's Association is here all day, every day for people facing Alzheimer's disease through our free 24/7 Helpline: [phone]. The Helpline provides reliable information and support to all those who need assistance, such as individuals living with memory loss, Alzheimer's or other dementia, caregivers, health care professionals and the public.  Our highly trained and knowledgeable staff can help you with: Understanding memory loss, dementia and Alzheimer's  Medications and other treatment options  General information about aging and brain health  Skills to provide quality care and to find the best care from professionals  Legal, financial and living-arrangement decisions Our Helpline also features: Confidential care consultation provided by master's level clinicians who can help with decision-making support, crisis assistance and education on issues families face every day  Help in a caller's preferred language using  our translation service that features more than 200 languages and dialects  Referrals to local community  programs, services and ongoing support     FALL PRECAUTIONS: Be cautious when walking. Scan the area for obstacles that may increase the risk of trips and falls. When getting up in the mornings, sit up at the edge of the bed for a few minutes before getting out of bed. Consider elevating the bed at the head end to avoid drop of blood pressure when getting up. Walk always in a well-lit room (use night lights in the walls). Avoid area rugs or power cords from appliances in the middle of the walkways. Use a walker or a cane if necessary and consider physical therapy for balance exercise. Get your eyesight checked regularly.  FINANCIAL OVERSIGHT: Supervision, especially oversight when making financial decisions or transactions is also recommended.  HOME SAFETY: Consider the safety of the kitchen when operating appliances like stoves, microwave oven, and blender. Consider having supervision and share cooking responsibilities until no longer able to participate in those. Accidents with firearms and other hazards in the house should be identified and addressed as well.   ABILITY TO BE LEFT ALONE: If patient is unable to contact 911 operator, consider using LifeLine, or when the need is there, arrange for someone to stay with patients. Smoking is a fire hazard, consider supervision or cessation. Risk of wandering should be assessed by caregiver and if detected at any point, supervision and safe proof recommendations should be instituted.  MEDICATION SUPERVISION: Inability to self-administer medication needs to be constantly addressed. Implement a mechanism to ensure safe administration of the medications.   DRIVING: Regarding driving, in patients with progressive memory problems, driving will be impaired. We advise to have someone else do the driving if trouble finding directions or if minor accidents are reported. Independent driving assessment is available to determine safety of driving.   If you are  interested in the driving assessment, you can contact the following:  [employer] in [place] [phone]  [facility] [phone]  [facility] [phone] or [phone]      Mediterranean Diet A Mediterranean diet refers to food and lifestyle choices that are based on the traditions of countries located on the [employer]. This way of eating has been shown to help prevent certain conditions and improve outcomes for people who have chronic diseases, like kidney disease and heart disease. What are tips for following this plan? Lifestyle  Cook and eat meals together with your family, when possible. Drink enough fluid to keep your urine clear or pale yellow. Be physically active every day. This includes: Aerobic exercise like running or swimming. Leisure activities like gardening, walking, or housework. Get 7-8 hours of sleep each night. If recommended by your health care provider, drink red wine in moderation. This means 1 glass a day for nonpregnant women and 2 glasses a day for men. A glass of wine equals 5 oz (150 mL). Reading food labels  Check the serving size of packaged foods. For foods such as rice and pasta, the serving size refers to the amount of cooked product, not dry. Check the total fat in packaged foods. Avoid foods that have saturated fat or trans fats. Check the ingredients list for added sugars, such as corn syrup. Shopping  At the grocery store, buy most of your food from the areas near the walls of the store. This includes: Fresh fruits and vegetables (produce). Grains, beans,  nuts, and seeds. Some of these may be available in unpackaged forms or large amounts (in bulk). Fresh seafood. Poultry and eggs. Low-fat dairy products. Buy whole ingredients instead of prepackaged foods. Buy fresh fruits and vegetables in-season from local farmers markets. Buy frozen fruits and  vegetables in resealable bags. If you do not have access to quality fresh seafood, buy precooked frozen shrimp or canned fish, such as tuna, salmon, or sardines. Buy small amounts of raw or cooked vegetables, salads, or olives from the deli or salad bar at your store. Stock your pantry so you always have certain foods on hand, such as olive oil, canned tuna, canned tomatoes, rice, pasta, and beans. Cooking  Cook foods with extra-virgin olive oil instead of using butter or other vegetable oils. Have meat as a side dish, and have vegetables or grains as your main dish. This means having meat in small portions or adding small amounts of meat to foods like pasta or stew. Use beans or vegetables instead of meat in common dishes like chili or lasagna. Experiment with different cooking methods. Try roasting or broiling vegetables instead of steaming or sauteing them. Add frozen vegetables to soups, stews, pasta, or rice. Add nuts or seeds for added healthy fat at each meal. You can add these to yogurt, salads, or vegetable dishes. Marinate fish or vegetables using olive oil, lemon juice, garlic, and fresh herbs. Meal planning  Plan to eat 1 vegetarian meal one day each week. Try to work up to 2 vegetarian meals, if possible. Eat seafood 2 or more times a week. Have healthy snacks readily available, such as: Vegetable sticks with hummus. Greek yogurt. Fruit and nut trail mix. Eat balanced meals throughout the week. This includes: Fruit: 2-3 servings a day Vegetables: 4-5 servings a day Low-fat dairy: 2 servings a day Fish, poultry, or lean meat: 1 serving a day Beans and legumes: 2 or more servings a week Nuts and seeds: 1-2 servings a day Whole grains: 6-8 servings a day Extra-virgin olive oil: 3-4 servings a day Limit red meat and sweets to only a few servings a month What are my food choices? Mediterranean diet Recommended Grains: Whole-grain pasta. Brown rice. Bulgar wheat. Polenta.  Couscous. Whole-wheat bread. Orpah Cobb. Vegetables: Artichokes. Beets. Broccoli. Cabbage. Carrots. Eggplant. Green beans. Chard. Kale. Spinach. Onions. Leeks. Peas. Squash. Tomatoes. Peppers. Radishes. Fruits: Apples. Apricots. Avocado. Berries. Bananas. Cherries. Dates. Figs. Grapes. Lemons. Melon. Oranges. Peaches. Plums. Pomegranate. Meats and other protein foods: Beans. Almonds. Sunflower seeds. Pine nuts. Peanuts. Cod. Salmon. Scallops. Shrimp. Tuna. Tilapia. Clams. Oysters. Eggs. Dairy: Low-fat milk. Cheese. Greek yogurt. Beverages: Water. Red wine. Herbal tea. Fats and oils: Extra virgin olive oil. Avocado oil. Grape seed oil. Sweets and desserts: [place] yogurt with honey. Baked apples. Poached pears. Trail mix. Seasoning and other foods: Basil. Cilantro. Coriander. Cumin. Mint. Parsley. Sage. Rosemary. Tarragon. Garlic. Oregano. Thyme. Pepper. Balsalmic vinegar. Tahini. Hummus. Tomato sauce. Olives. Mushrooms. Limit these Grains: Prepackaged pasta or rice dishes. Prepackaged cereal with added sugar. Vegetables: Deep fried potatoes (french fries). Fruits: Fruit canned in syrup. Meats and other protein foods: Beef. Pork. Lamb. Poultry with skin. Hot dogs. Tomasa Blase. Dairy: Ice cream. Sour cream. Whole milk. Beverages: Juice. Sugar-sweetened soft drinks. Beer. Liquor and spirits. Fats and oils: Butter. Canola oil. Vegetable oil. Beef fat (tallow). Lard. Sweets and desserts: Cookies. Cakes. Pies. Candy. Seasoning and other foods: Mayonnaise. Premade sauces and marinades. The items listed may not be a complete list. Talk with your dietitian about what  dietary choices are right for you. Summary The Mediterranean diet includes both food and lifestyle choices. Eat a variety of fresh fruits and vegetables, beans, nuts, seeds, and whole grains. Limit the amount of red meat and sweets that you eat. Talk with your health care provider about whether it is safe for you to drink red wine in  moderation. This means 1 glass a day for nonpregnant women and 2 glasses a day for men. A glass of wine equals 5 oz (150 mL). This information is not intended to replace advice given to you by your health care provider. Make sure you discuss any questions you have with your health care provider. Document Released: 07/25/2016 Document Revised: 08/27/2016 Document Reviewed: 07/25/2016 Elsevier Interactive Patient Education  2017 [employer].

## 2023-05-08 DIAGNOSIS — R1312 Dysphagia, oropharyngeal phase: Secondary | ICD-10-CM | POA: Diagnosis not present

## 2023-05-13 DIAGNOSIS — R1312 Dysphagia, oropharyngeal phase: Secondary | ICD-10-CM | POA: Diagnosis not present

## 2023-05-15 DIAGNOSIS — R1312 Dysphagia, oropharyngeal phase: Secondary | ICD-10-CM | POA: Diagnosis not present

## 2023-05-21 ENCOUNTER — Encounter: Payer: Medicare Other | Admitting: Family Medicine

## 2023-05-22 ENCOUNTER — Telehealth: Payer: Self-pay | Admitting: Physician Assistant

## 2023-05-22 NOTE — Telephone Encounter (Signed)
Referral was placed gave daughter number to [facility] [phone], clinicals sent.Daugter thanked me for calling

## 2023-05-22 NOTE — Telephone Encounter (Signed)
Pt's daughter called in stating they never heard back from any ophthalmologist. The pt was supposed to have been referred to one.

## 2023-05-28 ENCOUNTER — Other Ambulatory Visit: Payer: Self-pay | Admitting: Nurse Practitioner

## 2023-06-04 ENCOUNTER — Non-Acute Institutional Stay: Payer: Medicare Other | Admitting: Family Medicine

## 2023-06-04 VITALS — BP 115/76 | HR 70 | Temp 97.6°F | Resp 18

## 2023-06-04 DIAGNOSIS — H538 Other visual disturbances: Secondary | ICD-10-CM | POA: Diagnosis not present

## 2023-06-04 DIAGNOSIS — F32A Depression, unspecified: Secondary | ICD-10-CM

## 2023-06-04 DIAGNOSIS — I10 Essential (primary) hypertension: Secondary | ICD-10-CM

## 2023-06-04 DIAGNOSIS — E039 Hypothyroidism, unspecified: Secondary | ICD-10-CM

## 2023-06-04 DIAGNOSIS — H9193 Unspecified hearing loss, bilateral: Secondary | ICD-10-CM | POA: Diagnosis not present

## 2023-06-04 DIAGNOSIS — I4811 Longstanding persistent atrial fibrillation: Secondary | ICD-10-CM | POA: Diagnosis not present

## 2023-06-04 NOTE — Progress Notes (Signed)
Provider:  Jacalyn Lefevre, MD  Careteam: Patient Care Team: [name], [name], NP as PCP - General (Internal Medicine) [name], MD as Consulting Physician (Dermatology) [place], [facility] Frederica Kuster, MD as Attending Physician (Family Medicine)  PLACE OF SERVICE:  [facility]  Advanced Directive information    Allergies  Allergen Reactions   Ciprofloxacin Itching    Patient doesn't like the way it makes him feel   Lisinopril Itching    unknown   Sulfa Antibiotics Rash    No chief complaint on file.    HPI: Patient is a 89 y.o. male saw him today in his room in assisted living with daughter present.  His wife resides in memory care and he eats lunch with her about every day.  Some days she recognizes him, other days she does not.  He has several concerns toda about 6 months ago including B12 TSH lipids metabolic panel and CBC all of which are normal and would provide no explanation for unit fatigue.  We talked about this being a consequence of his age and relative inactivity but also raise the possibility that it could be related to some anxiety and/or depression given his separation from his wife who is in memory care as noted above.  He will consider this possibility.  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  He has never had any issues and has not been informed by the government about any surveillance.  He has not kept up with friends in the [place] who were in some more situation to know if possible consequences they may have had from exposure to radiation.  Secondly he is concerned about his energy I told him I would review any recent labs she has had and he did have labs  Review of Systems:  Review of Systems  Constitutional:  Positive for malaise/fatigue.  HENT:  Positive for hearing loss.   Eyes:  Positive for blurred vision.       History of macular degeneration   Cardiovascular: Negative.   Genitourinary: Negative.   Neurological: Negative.   All other systems reviewed and are negative.   Past Medical History:  Diagnosis Date   Atrial fibrillation (HCC) 03/26/2011   Basal cell carcinoma of other specified sites of skin 4/10/012   Basal cell carcinoma of skin, site unspecified 03/25/2012   Benign neoplasm of colon 03/25/2012   adenomatous polyps   Broken arm 11/25/2017   Colon polyp 2014   Degeneration of lumbar or lumbosacral intervertebral disc 09/24/2011   Disturbance of skin sensation 01/28/2012   Diverticulosis 2014   Dysphagia, unspecified(787.20) 03/26/2011   Elevated prostate specific antigen (PSA) 03/26/2011   Exposure to unspecified radiation 03/26/2011   Family history of colon cancer 07/16/2013   GERD (gastroesophageal reflux disease) 07/16/2013   Hearing deficit 12/21/2013   Hemangioma of unspecified site 03/26/2011   Hx of adenomatous colonic polyps 07/16/2013   Hypertrophy of prostate with urinary obstruction and other lower urinary tract symptoms (LUTS) 03/26/2011   Ingrowing nail 03/26/2011   Internal hemorrhoids without mention of complication 06/15/13   Keratosis, actinic 01/28/2012   Long term (current) use of anticoagulants 03/26/2011   Long term current use of anticoagulant therapy 07/23/2016   Lumbago 03/26/2011   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   Osteoarthrosis, unspecified whether generalized or localized, unspecified site 11/24/2012   Other  and unspecified hyperlipidemia 03/26/2011   Other malaise and fatigue 03/26/2011   Pain in joint, forearm 11/24/2012   Pain in joint, lower leg 06/15/13   left knee   Paresthesia of left arm 07/05/2014   Rectal bleed 06/15/13   Reflux esophagitis 03/26/2011   Sacroiliitis, not elsewhere classified (HCC) 05/28/2011   Scoliosis (and kyphoscoliosis), idiopathic 05/28/2011   Squamous cell carcinoma of skin of upper limb, including shoulder 03/26/2011   Tinnitus 12/21/2013    Transient ischemic attack (TIA), and cerebral infarction without residual deficits(V12.54) 03/26/2011   Unspecified essential hypertension 03/26/2011   Past Surgical History:  Procedure Laterality Date   BASAL CELL CARCINOMA EXCISION Left 12/10/14   Dr. [name]   COLONOSCOPY  08/12/2013   Dr. Rhea Belton adenomatous polyps   CRYOTHERAPY Left 2009   hand   PHOTODYNAMIC THERAPY  09/26/14   face and ear lobs  Dr. [name]   SQUAMOUS CELL CARCINOMA EXCISION Left 2010   hand   Social History:   reports that he has never smoked. He has never used smokeless tobacco. He reports that he does not drink alcohol and does not use drugs.  Family History  Problem Relation Age of Onset   Cancer Mother        colon   Heart disease Father        MI    Medications: Patient's Medications  New Prescriptions   No medications on file  Previous Medications   ACETAMINOPHEN (TYLENOL) 500 MG TABLET    Take 500 mg by mouth daily.   AMLODIPINE (NORVASC) 2.5 MG TABLET    Take 1 tablet (2.5 mg total) by mouth daily.   CLONIDINE (CATAPRES) 0.1 MG TABLET    Take 1 tablet (0.1 mg total) by mouth daily as needed. IF BP GREATER THAN 180/100   ELIQUIS 5 MG TABS TABLET    TAKE 1 TABLET TWICE A DAY FOR ANTICOAGULATION   FAMOTIDINE (PEPCID) 20 MG TABLET    TAKE 1 TABLET DAILY AS NEEDED FOR HEARTBURN OR INDIGESTION   FINASTERIDE (PROSCAR) 5 MG TABLET    TAKE 1 TABLET DAILY FOR URINATING DRIBBLING/PROSTATE ENLARGEMENT   LEVOTHYROXINE (SYNTHROID) 25 MCG TABLET    TAKE 1 TABLET DAILY BEFORE BREAKFAST   METOPROLOL SUCCINATE (TOPROL-XL) 50 MG 24 HR TABLET    TAKE 1 TABLET TWICE A DAY   WITH OR IMMEDIATELY FOLLOWING A MEAL TO CONTROL BLOOD PRESSURE AND HEART RHYTHM   SIMVASTATIN (ZOCOR) 10 MG TABLET    TAKE 1 TABLET DAILY TO LOWER CHOLESTEROL   TERAZOSIN (HYTRIN) 2 MG CAPSULE    TAKE 1 CAPSULE DAILY   VALSARTAN (DIOVAN) 320 MG TABLET    Take 320 mg by mouth daily.  Modified Medications   No medications on file   Discontinued Medications   No medications on file    Physical Exam:  There were no vitals filed for this visit. There is no height or weight on file to calculate BMI. Wt Readings from Last 3 Encounters:  05/07/23 192 lb (87.1 kg)  03/20/23 194 lb (88 kg)  02/25/23 198 lb (89.8 kg)    Physical Exam Vitals and nursing note reviewed.  Constitutional:      Appearance: Normal appearance.  HENT:     Mouth/Throat:     Mouth: Mucous membranes are moist.     Pharynx: Oropharynx is clear.  Eyes:     Pupils: Pupils are equal, round, and reactive to light.  Cardiovascular:     Rate and Rhythm:  Normal rate. Rhythm irregular.  Pulmonary:     Effort: Pulmonary effort is normal.     Breath sounds: Normal breath sounds.  Skin:    General: Skin is warm and dry.  Neurological:     General: No focal deficit present.     Mental Status: He is alert and oriented to person, place, and time.  Psychiatric:        Mood and Affect: Mood normal.        Behavior: Behavior normal.     Labs reviewed: Basic Metabolic Panel: Recent Labs    06/20/22 1529 06/22/22 1557 10/15/22 0730  NA 138 139 141  K 4.6 4.3 4.3  CL 104 103 106  CO2 24 27 29   GLUCOSE 109* 107* 80  BUN 14 17 17   CREATININE 1.05 1.18 1.11  CALCIUM 9.2 9.9 9.1  TSH  --   --  2.02   Liver Function Tests: Recent Labs    06/20/22 1529 10/15/22 0730  AST 19 11  ALT 12 7*  ALKPHOS 79  --   BILITOT 1.4* 0.7  PROT 6.8 6.5  ALBUMIN 3.5  --    Recent Labs    06/20/22 1529  LIPASE 33   No results for input(s): "AMMONIA" in the last 8760 hours. CBC: Recent Labs    06/20/22 1529 06/22/22 1557 10/15/22 0730  WBC 13.3* 8.3 6.8  NEUTROABS 10.9*  --  4,869  HGB 15.2 14.6 14.3  HCT 45.3 43.1 41.4  MCV 96.8 96.9 95.0  PLT 175 201 182   Lipid Panel: Recent Labs    10/15/22 0730  CHOL 123  HDL 50  LDLCALC 59  TRIG 65  CHOLHDL 2.5   TSH: Recent Labs    10/15/22 0730  TSH 2.02   A1C: No results found  for: "HGBA1C"   Assessment/Plan  1. Longstanding persistent atrial fibrillation (HCC)  Continues with metoprolol and Eliquis 2. Blurred vision Related to macular degeneration has upcoming appointment with ophthalmology in October  3. Depression, unspecified depression type Patient is not on antidepressant I have asked her to consider this in hopes that it might help his energy  4. Essential hypertension Blood pressure is good on combination metoprolol Terrazas and and valsartan  5. Bilateral hearing loss, unspecified hearing loss type Patient is hard of hearing even with hearing aids  6. Hypothyroidism, unspecified type Last TSH was in therapeutic range   Jacalyn Lefevre, MD [facility] [phone]

## 2023-06-05 ENCOUNTER — Other Ambulatory Visit: Payer: Self-pay | Admitting: Internal Medicine

## 2023-06-05 NOTE — Telephone Encounter (Signed)
Medication has warnings. Medication pend and sent to PCP [name], [name], NP

## 2023-06-09 ENCOUNTER — Other Ambulatory Visit: Payer: Self-pay

## 2023-06-09 MED ORDER — VALSARTAN 320 MG PO TABS: 320.0000 mg | ORAL_TABLET | Freq: Every day | ORAL | 3 refills | Status: DC

## 2023-06-09 NOTE — Telephone Encounter (Signed)
Daughter called request a new prescription to be send in for Valsartan 320 MG daily. Medications never filled by Man Link Snuffer [name],NP.

## 2023-06-13 ENCOUNTER — Other Ambulatory Visit: Payer: Self-pay | Admitting: Family Medicine

## 2023-07-23 ENCOUNTER — Telehealth: Payer: Self-pay

## 2023-07-23 NOTE — Telephone Encounter (Signed)
Refill [facility] received [facility] for pantoprazole 40 mg tablet. Medication is not on active medication list.   Message sent to Dr. Jacalyn Lefevre to send to pharmacy if appropriate.

## 2023-07-25 ENCOUNTER — Non-Acute Institutional Stay: Payer: Medicare Other | Admitting: Nurse Practitioner

## 2023-07-25 ENCOUNTER — Encounter: Payer: Self-pay | Admitting: Nurse Practitioner

## 2023-07-25 DIAGNOSIS — E039 Hypothyroidism, unspecified: Secondary | ICD-10-CM

## 2023-07-25 DIAGNOSIS — I4811 Longstanding persistent atrial fibrillation: Secondary | ICD-10-CM

## 2023-07-25 DIAGNOSIS — E782 Mixed hyperlipidemia: Secondary | ICD-10-CM | POA: Diagnosis not present

## 2023-07-25 DIAGNOSIS — K219 Gastro-esophageal reflux disease without esophagitis: Secondary | ICD-10-CM

## 2023-07-25 DIAGNOSIS — U071 COVID-19: Secondary | ICD-10-CM | POA: Insufficient documentation

## 2023-07-25 DIAGNOSIS — I10 Essential (primary) hypertension: Secondary | ICD-10-CM | POA: Diagnosis not present

## 2023-07-25 DIAGNOSIS — N401 Enlarged prostate with lower urinary tract symptoms: Secondary | ICD-10-CM

## 2023-07-25 DIAGNOSIS — M19011 Primary osteoarthritis, right shoulder: Secondary | ICD-10-CM

## 2023-07-25 DIAGNOSIS — N138 Other obstructive and reflux uropathy: Secondary | ICD-10-CM

## 2023-07-25 DIAGNOSIS — G3184 Mild cognitive impairment, so stated: Secondary | ICD-10-CM

## 2023-07-25 NOTE — Assessment & Plan Note (Signed)
tested positive COVID, presentations: scratchy throat, sternum area discomfort when cough, no phlegm production, denied SOB or heart palpation. He is afebrile, no O2 desaturation. Pending HPOA consent for Paxlovid.  Update CBC/diff, CMP/eGFR, TSH, lipid panel.

## 2023-07-25 NOTE — Assessment & Plan Note (Signed)
urinary frequency, on Terazosin, Proscar, saw Urology 12/19/22

## 2023-07-25 NOTE — Assessment & Plan Note (Signed)
on Levothyroxine, TSH 2.02 10/15/22 

## 2023-07-25 NOTE — Progress Notes (Signed)
Location:  [facility] Room Number: 914-A Place of Service:  ALF [phone]) Provider:  Tulani Kidney X, NP  Patient Care Team: Dary Dilauro X, NP as PCP - General (Internal Medicine) [name], MD as Consulting Physician (Dermatology) [place], [facility] Frederica Kuster, MD as Attending Physician [facility] Medicine)  Extended Emergency Contact Information Primary Emergency Contact: Phillip [name] Address: [address] [place], [place] [address] [place] Phone: [phone] Relation: Daughter Secondary Emergency Contact: Phillip [name] [place] of [place] Mobile Phone: [phone] Relation: Daughter  Code Status:  Full Code Goals of care: Advanced Directive information    07/25/2023   12:15 PM  Advanced Directives  Does Patient Have a Medical Advance Directive? Yes  Type of Estate agent of [place];Living will  Does patient want to make changes to medical advance directive? No - Patient declined  Copy of Healthcare Power of Attorney in Chart? Yes - validated most recent copy scanned in chart (See row information)     Chief Complaint  Patient presents with  . Acute Visit    tested + COVID    HPI:  Pt is a 89 y.o. [name] seen today for an acute visit for tested positive COVID, presentations: scratchy throat, sternum area discomfort when cough, no phlegm production, denied SOB or heart palpation. He is afebrile, no O2 desaturation.     HTN, on Metoprolol, Valsartan,  Bun/creat 17/1.11 10/15/22             HLD on Simvastatin, LDL 59 10/15/22             Hypothyroidism, on Levothyroxine, TSH 2.02 10/15/22             BPH, urinary frequency, on Terazosin, Proscar, saw Urology 12/19/22             Afib, heart rate is in control, taking Eliquis, Metoprolol. F/u Cardiology             OA R shoulder, X-ray showed DJD, on Tylenol.              MD, recent diagnosed             GERD stable on Famotidine. Hgb 14.3  10/15/22             Depression, GDR Lexapro-off, stable.              Visual hallucination, resolved,  CT head negative, underwent neurology evaluation             Gait abnormality, uses walker, risk of falling.              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 Vit B12 465 07/04/22. Saw neurology 10/21/22    Past Medical History:  Diagnosis Date  . Atrial fibrillation (HCC) 03/26/2011  . Basal cell carcinoma of other specified sites of skin 4/10/012  . Basal cell carcinoma of skin, site unspecified 03/25/2012  . Benign neoplasm of colon 03/25/2012   adenomatous polyps  . Broken arm 11/25/2017  . Colon polyp 2014  . Degeneration of lumbar or lumbosacral intervertebral disc 09/24/2011  . Disturbance of skin sensation 01/28/2012  . Diverticulosis 2014  . Dysphagia, unspecified(787.20) 03/26/2011  . Elevated prostate specific antigen (PSA) 03/26/2011  . Exposure to unspecified radiation 03/26/2011  . Family history of colon cancer 07/16/2013  . GERD (gastroesophageal reflux  disease) 07/16/2013  . Hearing deficit 12/21/2013  . Hemangioma of unspecified site 03/26/2011  . Hx of adenomatous colonic polyps 07/16/2013  . Hypertrophy of prostate with urinary obstruction and other lower urinary tract symptoms (LUTS) 03/26/2011  . Ingrowing nail 03/26/2011  . Internal hemorrhoids without mention of complication 06/15/13  . Keratosis, actinic 01/28/2012  . Long term (current) use of anticoagulants 03/26/2011  . Long term current use of anticoagulant therapy 07/23/2016  . Lumbago 03/26/2011  . 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 Osteoarthrosis, unspecified whether generalized or localized, unspecified site 11/24/2012  . Other and unspecified hyperlipidemia 03/26/2011  . Other malaise and fatigue 03/26/2011  . Pain in joint, forearm 11/24/2012  . Pain in joint, lower leg 06/15/13   left knee  .  Paresthesia of left arm 07/05/2014  . Rectal bleed 06/15/13  . Reflux esophagitis 03/26/2011  . Sacroiliitis, not elsewhere classified (HCC) 05/28/2011  . Scoliosis (and kyphoscoliosis), idiopathic 05/28/2011  . 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 Tinnitus 12/21/2013  . 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 Unspecified essential hypertension 03/26/2011   Past Surgical History:  Procedure Laterality Date  . BASAL CELL CARCINOMA EXCISION Left 12/10/14   Dr. [name]  . COLONOSCOPY  08/12/2013   Dr. Rhea Belton adenomatous polyps  . CRYOTHERAPY Left 2009   hand  . PHOTODYNAMIC THERAPY  09/26/14   face and ear lobs  Dr. [name]  . SQUAMOUS CELL CARCINOMA EXCISION Left 2010   hand    Allergies  Allergen Reactions  . Ciprofloxacin Itching    Patient doesn't like the way it makes him feel  . Lisinopril Itching    unknown  . Sulfa Antibiotics Rash    Outpatient Encounter Medications as of 07/25/2023  Medication Sig  . acetaminophen (TYLENOL) 500 MG tablet Take 500 mg by mouth daily.  Marland Kitchen amLODipine (NORVASC) 2.5 MG tablet Take 1 tablet (2.5 mg total) by mouth daily.  . cloNIDine (CATAPRES) 0.1 MG tablet Take 1 tablet (0.1 mg total) by mouth daily as needed. IF BP GREATER THAN 180/100  . ELIQUIS 5 MG TABS tablet TAKE 1 TABLET TWICE A DAY FOR ANTICOAGULATION  . finasteride (PROSCAR) 5 MG tablet TAKE 1 TABLET DAILY FOR URINATING DRIBBLING/PROSTATE ENLARGEMENT  . levothyroxine (SYNTHROID) 25 MCG tablet TAKE 1 TABLET DAILY BEFORE BREAKFAST  . metoprolol succinate (TOPROL-XL) 50 MG 24 hr tablet TAKE 1 TABLET TWICE A DAY   WITH OR IMMEDIATELY FOLLOWING A MEAL TO CONTROL BLOOD PRESSURE AND HEART RHYTHM  . simvastatin (ZOCOR) 10 MG tablet TAKE 1 TABLET DAILY TO LOWER CHOLESTEROL  . terazosin (HYTRIN) 2 MG capsule TAKE 1 CAPSULE DAILY  . valsartan (DIOVAN) 320 MG tablet Take 1 tablet (320 mg  total) by mouth daily.  . [DISCONTINUED] pantoprazole (PROTONIX) 40 MG tablet Take 40 mg by mouth daily.  . [DISCONTINUED] famotidine (PEPCID) 20 MG tablet TAKE 1 TABLET DAILY AS NEEDED FOR HEARTBURN OR INDIGESTION   No facility-administered encounter medications on file as of 07/25/2023.    Review of Systems  Constitutional:  Negative for appetite change, fatigue and fever.  HENT:  Positive for hearing loss and sore throat. Negative for congestion and trouble swallowing.        Scratchy throat  Eyes:  Negative for visual disturbance.  Respiratory:  Positive for cough. Negative for shortness of breath and wheezing.        Sternum  area discomfort when coughs.   Cardiovascular:  Negative for leg swelling.  Gastrointestinal:  Negative for abdominal pain and constipation.  Genitourinary:  Positive for frequency. Negative for dysuria and urgency.  Musculoskeletal:  Positive for arthralgias and gait problem.  Skin:  Negative for color change.  Neurological:  Negative for speech difficulty, weakness and headaches.  Psychiatric/Behavioral:  Negative for confusion, hallucinations and sleep disturbance.        Feels better    Immunization History  Administered Date(s) Administered  . Fluad Quad(high Dose 65+) 10/07/2022  . Influenza Split 11/18/2007, 10/23/2009, 10/10/2010  . Influenza Whole 09/15/2012, 09/16/2013  . Influenza, High Dose Seasonal PF 09/24/2017, 09/20/2021  . Influenza,inj,Quad PF,6+ Mos 09/17/2018  . Influenza-Unspecified 09/29/2014, 09/14/2015, 09/26/2016, 09/05/2020  . Moderna Covid-19 Vaccine Bivalent Booster 59yrs & up 09/05/2021, 10/17/2022  . Moderna SARS-COV2 Booster Vaccination 12/31/2019, 05/23/2021  . Moderna Sars-Covid-2 Vaccination 12/20/2019, 01/17/2020  . PPD Test 10/10/2010, 11/27/2010  . Pneumococcal Conjugate-13 02/08/2016  . Pneumococcal Polysaccharide-23 01/17/1996  . Pneumococcal-Unspecified 01/17/1996  . Td 02/14/2006, 09/04/2016  . Td (Adult), 2 Lf  Tetanus Toxid, Preservative Free 02/14/2006, 09/04/2016  . Tdap 03/05/2006  . Zoster Recombinant(Shingrix) 03/31/2018, 06/23/2018  . Zoster, Live 03/05/2006   Pertinent  Health Maintenance Due  Topic Date Due  . INFLUENZA VACCINE  07/17/2023  . Colonoscopy  Discontinued      06/22/2022    3:41 PM 07/04/2022    1:24 PM 10/21/2022    9:10 AM 03/20/2023    4:18 PM 05/07/2023    2:50 PM  Fall Risk  Falls in the past year?  1 1 0 0  Was there an injury with Fall?  0 1 0 0  Fall Risk Category Calculator  2 3 0 0  Fall Risk Category (Retired)  Moderate High    (RETIRED) Patient Fall Risk Level Moderate fall risk High fall risk High fall risk    Patient at Risk for Falls Due to    No Fall Risks   Fall risk Follow up   Falls evaluation completed Falls evaluation completed Falls evaluation completed   Functional Status Survey:    Vitals:   07/25/23 1200  BP: 96/63  Pulse: 80  Resp: 18  Temp: (!) 97.5 F (36.4 C)  SpO2: 98%  Weight: 197 lb 6.4 oz (89.5 kg)  Height: 5\' 7"  (1.702 m)   Body mass index is 30.92 kg/m. Physical Exam Constitutional:      Appearance: Normal appearance.  HENT:     Head: Normocephalic and atraumatic.     Nose: Nose normal.     Mouth/Throat:     Mouth: Mucous membranes are moist.     Pharynx: Posterior oropharyngeal erythema present.  Eyes:     Extraocular Movements: Extraocular movements intact.     Conjunctiva/sclera: Conjunctivae normal.     Pupils: Pupils are equal, round, and reactive to light.  Cardiovascular:     Rate and Rhythm: Normal rate. Rhythm irregular.     Heart sounds: No murmur heard. Pulmonary:     Effort: Pulmonary effort is normal.     Breath sounds: No wheezing, rhonchi or rales.     Comments: Central congestion.  Abdominal:     Palpations: Abdomen is soft.     Tenderness: There is no abdominal tenderness. There is no right CVA tenderness, guarding or rebound.  Musculoskeletal:        General: Normal range of motion.      Cervical back: Normal range of motion and  neck supple.     Right lower leg: No edema.     Left lower leg: No edema.  Skin:    General: Skin is warm and dry.  Neurological:     General: No focal deficit present.     Mental Status: He is alert and oriented to person, place, and time. Mental status is at baseline.     Gait: Gait abnormal.  Psychiatric:        Mood and Affect: Mood normal.        Behavior: Behavior normal.        Thought Content: Thought content normal.    Labs reviewed: Recent Labs    10/15/22 0730  NA 141  K 4.3  CL 106  CO2 29  GLUCOSE 80  BUN 17  CREATININE 1.11  CALCIUM 9.1   Recent Labs    10/15/22 0730  AST 11  ALT 7*  BILITOT 0.7  PROT 6.5   Recent Labs    10/15/22 0730  WBC 6.8  NEUTROABS 4,869  HGB 14.3  HCT 41.4  MCV 95.0  PLT 182   Lab Results  Component Value Date   TSH 2.02 10/15/2022   No results found for: "HGBA1C" Lab Results  Component Value Date   CHOL 123 10/15/2022   HDL 50 10/15/2022   LDLCALC 59 10/15/2022   TRIG 65 10/15/2022   CHOLHDL 2.5 10/15/2022    Significant Diagnostic Results in last 30 days:  No results found.  Assessment/Plan COVID-19 virus infection tested positive COVID, presentations: scratchy throat, sternum area discomfort when cough, no phlegm production, denied SOB or heart palpation. He is afebrile, no O2 desaturation. Pending HPOA consent for Paxlovid.  Update CBC/diff, CMP/eGFR, TSH, lipid panel.   Essential hypertension Blood pressure is controlled,  on Metoprolol, Valsartan,  Bun/creat 17/1.11 10/15/22  Hyperlipidemia on Simvastatin, LDL 59 10/15/22  Hypothyroidism  on Levothyroxine, TSH 2.02 10/15/22  BPH with obstruction/lower urinary tract symptoms urinary frequency, on Terazosin, Proscar, saw Urology 12/19/22  Atrial fibrillation (HCC) heart rate is in control, taking Eliquis, Metoprolol. F/u Cardiology  Osteoarthritis of right shoulder R shoulder, X-ray showed DJD, on  Tylenol.   GERD (gastroesophageal reflux disease)  stable on Famotidine. 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 Vit B12 465 07/04/22. Saw neurology 10/21/22     Family/ staff Communication: plan of care reviewed with the patient and charge nurse.   Labs/tests ordered: CBC/diff, CMP/eGFR, TSH, lipids.   Time spend 40 minutes.

## 2023-07-25 NOTE — Assessment & Plan Note (Signed)
R shoulder, X-ray showed DJD, on Tylenol.

## 2023-07-25 NOTE — Assessment & Plan Note (Signed)
on Simvastatin, LDL 59 10/15/22

## 2023-07-25 NOTE — Assessment & Plan Note (Signed)
stable on Famotidine. Hgb 14.3 10/15/22 

## 2023-07-25 NOTE — Assessment & Plan Note (Signed)
Blood pressure is controlled, on Metoprolol, Valsartan,  Bun/creat 17/1.11 10/15/22

## 2023-07-25 NOTE — Assessment & Plan Note (Signed)
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 Vit B12 465 07/04/22. Saw neurology 10/21/22

## 2023-07-25 NOTE — Assessment & Plan Note (Signed)
heart rate is in control, taking Eliquis, Metoprolol. F/u Cardiology 

## 2023-07-28 ENCOUNTER — Other Ambulatory Visit: Payer: Self-pay | Admitting: Family Medicine

## 2023-07-28 MED ORDER — PANTOPRAZOLE SODIUM 40 MG PO TBEC: 40.0000 mg | DELAYED_RELEASE_TABLET | Freq: Every day | ORAL | 2 refills | Status: DC

## 2023-07-29 LAB — LIPID PANEL
Cholesterol: 111 (ref 0–200)
HDL: 37 (ref 35–70)
LDL Cholesterol: 55
LDl/HDL Ratio: 3
Triglycerides: 108 (ref 40–160)

## 2023-07-29 LAB — CBC AND DIFFERENTIAL
HCT: 44 (ref 41–53)
Hemoglobin: 14.8 (ref 13.5–17.5)
Neutrophils Absolute: 4008
Platelets: 176 10*3/uL (ref 150–400)
WBC: 6

## 2023-07-29 LAB — BASIC METABOLIC PANEL
BUN: 16 (ref 4–21)
CO2: 28 — AB (ref 13–22)
Chloride: 106 (ref 99–108)
Creatinine: 1.3 (ref 0.6–1.3)
Glucose: 84
Potassium: 4.1 meq/L (ref 3.5–5.1)
Sodium: 142 (ref 137–147)

## 2023-07-29 LAB — COMPREHENSIVE METABOLIC PANEL
Albumin: 3.7 (ref 3.5–5.0)
Calcium: 9.1 (ref 8.7–10.7)
Globulin: 2.7
eGFR: 54

## 2023-07-29 LAB — HEPATIC FUNCTION PANEL
ALT: 10 U/L (ref 10–40)
AST: 16 (ref 14–40)
Alkaline Phosphatase: 81 (ref 25–125)
Bilirubin, Total: 0.9

## 2023-07-29 LAB — CBC: RBC: 4.61 (ref 3.87–5.11)

## 2023-07-29 LAB — TSH: TSH: 3.92 (ref 0.41–5.90)

## 2023-08-05 ENCOUNTER — Non-Acute Institutional Stay: Payer: Medicare Other | Admitting: Family Medicine

## 2023-08-05 DIAGNOSIS — I10 Essential (primary) hypertension: Secondary | ICD-10-CM | POA: Diagnosis not present

## 2023-08-05 DIAGNOSIS — N138 Other obstructive and reflux uropathy: Secondary | ICD-10-CM

## 2023-08-05 DIAGNOSIS — E039 Hypothyroidism, unspecified: Secondary | ICD-10-CM

## 2023-08-05 DIAGNOSIS — E782 Mixed hyperlipidemia: Secondary | ICD-10-CM

## 2023-08-05 DIAGNOSIS — U071 COVID-19: Secondary | ICD-10-CM | POA: Diagnosis not present

## 2023-08-05 DIAGNOSIS — I4811 Longstanding persistent atrial fibrillation: Secondary | ICD-10-CM | POA: Diagnosis not present

## 2023-08-05 DIAGNOSIS — G3184 Mild cognitive impairment, so stated: Secondary | ICD-10-CM

## 2023-08-05 DIAGNOSIS — N401 Enlarged prostate with lower urinary tract symptoms: Secondary | ICD-10-CM

## 2023-08-05 NOTE — Progress Notes (Signed)
Provider:  Jacalyn Lefevre, MD Location:      Place of Service:     PCP: [name], [name], NP Patient Care Team: [name], [name], NP as PCP - General (Internal [name]) Phillip Starch, MD as Consulting Physician (Dermatology) [place] Frederica Kuster, MD as Attending Physician Phillip [name])  Extended Emergency Contact Information Primary Emergency Contact: [name],Phillip Address: [address] [place], [place] [address] [place] of [place] Phone: [phone] Relation: Daughter Secondary Emergency Contact: Phillip [name] [place] of [place] Mobile Phone: [phone] Relation: Daughter  Code Status:  Goals of Care: Advanced Directive information    07/25/2023   12:15 PM  Advanced Directives  Does Patient Have a Medical Advance Directive? Yes  Type of Estate agent of [place];Living will  Does patient want to make changes to medical advance directive? No - Patient declined  Copy of Healthcare Power of Attorney in Chart? Yes - validated most recent copy scanned in chart (See row information)      No chief complaint on file.   HPI: Patient is a 89 y.o. male seen today for medical management of chronic problems including atrial fibrillation, hearing loss, mild cognitive decline, gait abnormality, BPH, hyperlipidemia, and hypertension. Will recently tested positive for COVID.  Symptoms were mild but he was quarantined for 10 days and took anti-COVID [name].  He moved around very little while he had COVID and now states that he feels weak with malaise.  Using wheelchair more and walker less since COVID Blood pressure is well-controlled on metoprolol and valsartan.  Lipids are controlled on simvastatin with LDL of 59. BPH symptoms are managed with combination dutasteride and [place] and  Past Medical History:  Diagnosis Date   Atrial fibrillation (HCC) 03/26/2011   Basal cell carcinoma of other specified sites of skin  4/10/012   Basal cell carcinoma of skin, site unspecified 03/25/2012   Benign neoplasm of colon 03/25/2012   adenomatous polyps   Broken arm 11/25/2017   Colon polyp 2014   Degeneration of lumbar or lumbosacral intervertebral disc 09/24/2011   Disturbance of skin sensation 01/28/2012   Diverticulosis 2014   Dysphagia, unspecified(787.20) 03/26/2011   Elevated prostate specific antigen (PSA) 03/26/2011   Exposure to unspecified radiation 03/26/2011   Family history of colon cancer 07/16/2013   GERD (gastroesophageal reflux disease) 07/16/2013   Hearing deficit 12/21/2013   Hemangioma of unspecified site 03/26/2011   Hx of adenomatous colonic polyps 07/16/2013   Hypertrophy of prostate with urinary obstruction and other lower urinary tract symptoms (LUTS) 03/26/2011   Ingrowing nail 03/26/2011   Internal hemorrhoids without mention of complication 06/15/13   Keratosis, actinic 01/28/2012   Long term (current) use of anticoagulants 03/26/2011   Long term current use of anticoagulant therapy 07/23/2016   Lumbago 03/26/2011   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   Osteoarthrosis, unspecified whether generalized or localized, unspecified site 11/24/2012   Other and unspecified hyperlipidemia 03/26/2011   Other malaise and fatigue 03/26/2011   Pain in joint, forearm 11/24/2012   Pain in joint, lower leg 06/15/13   left knee   Paresthesia of left arm 07/05/2014   Rectal bleed 06/15/13   Reflux esophagitis 03/26/2011   Sacroiliitis, not elsewhere classified (HCC) 05/28/2011   Scoliosis (and kyphoscoliosis), idiopathic 05/28/2011   Squamous cell carcinoma of skin of upper limb, including shoulder 03/26/2011   Tinnitus 12/21/2013   Transient ischemic attack (TIA), and  cerebral infarction without residual deficits(V12.54) 03/26/2011   Unspecified essential hypertension 03/26/2011   Past Surgical History:  Procedure Laterality Date   BASAL CELL CARCINOMA EXCISION Left 12/10/14   Dr. Donzetta [name]    COLONOSCOPY  08/12/2013   Dr. Rhea Belton adenomatous polyps   CRYOTHERAPY Left 2009   hand   PHOTODYNAMIC THERAPY  09/26/14   face and ear lobs  Dr. Donzetta [name]   SQUAMOUS CELL CARCINOMA EXCISION Left 2010   hand    reports that he has never smoked. He has never used smokeless tobacco. He reports that he does not drink alcohol and does not use drugs. 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    Employer: RETIRED  Tobacco Use   Smoking status: Never   Smokeless tobacco: Never  Vaping Use   Vaping status: Never Used  Substance and Sexual Activity   Alcohol use: No   Drug use: No   Sexual activity: Not on file  Other Topics Concern   Not on file  Social History Narrative   [facility] 11/[facility]   Married - Thurston Hole   Does not have Living Will   Never smoked   Alcohol none   Exercise: tennis, pool, table tennis 2-3 times a week   Several daughters   Social Determinants of Health   Financial Resource Strain: Low Risk  (03/25/2022)   Overall Financial Resource Strain (CARDIA)    Difficulty of Paying Living Expenses: Not hard at all  Food Insecurity: No Food Insecurity (03/25/2022)   Hunger Vital Sign    Worried About Running Out of Food in the Last Year: Never true    Ran Out of Food in the Last Year: Never true  Transportation Needs: No Transportation Needs (03/25/2022)   PRAPARE - Administrator, Civil Service (Medical): No    Lack of Transportation (Non-Medical): No  Physical Activity: Inactive (03/25/2022)   Exercise Vital Sign    Days of Exercise per Week: 0 days    Minutes of Exercise per Session: 0 min  Stress: No Stress Concern Present (03/25/2022)   [employer] of Occupational Health - Occupational  Stress Questionnaire    Feeling of Stress : Only a little  Social Connections: Moderately Isolated (03/25/2022)   Social Connection and Isolation Panel [NHANES]    Frequency of Communication with Friends and Family: More than three times a week    Frequency of Social Gatherings with Friends and Family: More than three times a week    Attends Religious Services: Never    Database administrator or Organizations: No    Attends Banker Meetings: Never    Marital Status: Married  Catering manager Violence: Not At Risk (03/25/2022)   Humiliation, Afraid, Rape, and Kick questionnaire    Fear of Current or Ex-Partner: No    Emotionally Abused: No    Physically Abused: No    Sexually Abused: No    Functional Status Survey:    Family History  Problem Relation Age of Onset   Cancer Mother        colon   Heart disease Father        MI    Health Maintenance  Topic Date Due   COVID-19 Vaccine (5 - 2023-24 season) 12/12/2022   INFLUENZA VACCINE  07/17/2023   Medicare Annual Wellness (AWV)  03/19/2024   DTaP/Tdap/Td (5 - Td or Tdap) 09/04/2026   Pneumonia Vaccine 51+ Years old  Completed   Zoster Vaccines- Shingrix  Completed   HPV VACCINES  Aged Out   Colonoscopy  Discontinued    Allergies  Allergen Reactions   Ciprofloxacin Itching    Patient doesn't like the way it makes him feel   Lisinopril Itching    unknown   Sulfa Antibiotics Rash    Outpatient Encounter Medications as of 08/05/2023  Medication Sig   acetaminophen (TYLENOL) 500 MG tablet Take 500 mg by mouth daily.   amLODipine (NORVASC) 2.5 MG tablet Take 1 tablet (2.5 mg total) by mouth daily.   cloNIDine (CATAPRES) 0.1 MG tablet Take 1 tablet (0.1 mg total) by mouth daily as needed. IF BP GREATER THAN 180/100   ELIQUIS 5 MG TABS tablet TAKE 1 TABLET TWICE A DAY FOR ANTICOAGULATION   finasteride (PROSCAR) 5 MG tablet TAKE 1 TABLET DAILY FOR URINATING DRIBBLING/PROSTATE ENLARGEMENT   levothyroxine  (SYNTHROID) 25 MCG tablet TAKE 1 TABLET DAILY BEFORE BREAKFAST   metoprolol succinate (TOPROL-XL) 50 MG 24 hr tablet TAKE 1 TABLET TWICE A DAY   WITH OR IMMEDIATELY FOLLOWING A MEAL TO CONTROL BLOOD PRESSURE AND HEART RHYTHM   pantoprazole (PROTONIX) 40 MG tablet Take 1 tablet (40 mg total) by mouth daily.   simvastatin (ZOCOR) 10 MG tablet TAKE 1 TABLET DAILY TO LOWER CHOLESTEROL   terazosin (HYTRIN) 2 MG capsule TAKE 1 CAPSULE DAILY   valsartan (DIOVAN) 320 MG tablet Take 1 tablet (320 mg total) by mouth daily.   No facility-administered encounter medications on file as of 08/05/2023.    Review of Systems  Constitutional:  Positive for fatigue.  HENT:  Positive for hearing loss.   Respiratory: Negative.    Cardiovascular: Negative.   Gastrointestinal: Negative.   Neurological: Negative.   Psychiatric/Behavioral: Negative.    All other systems reviewed and are negative.   There were no vitals filed for this visit. There is no height or weight on file to calculate BMI. Physical Exam Vitals and nursing note reviewed.  HENT:     Mouth/Throat:     Mouth: Mucous membranes are moist.     Pharynx: Oropharynx is clear.  Eyes:     Extraocular Movements: Extraocular movements intact.     Pupils: Pupils are equal, round, and reactive to light.  Cardiovascular:     Rate and Rhythm: Normal rate. Rhythm irregular.  Pulmonary:     Effort: Pulmonary effort is normal.     Breath sounds: Normal breath sounds.  Musculoskeletal:     Cervical back: Normal range of motion.     Comments: Using wheelchair for walker today.  Encouraged more activity to increase strength and stamina  Skin:    General: Skin is warm.  Neurological:     General: No focal deficit present.     Mental Status: He is oriented to person, place, and time.     Labs reviewed: Basic Metabolic Panel: Recent Labs    10/15/22 0730  NA 141  K 4.3  CL 106  CO2 29  GLUCOSE 80  BUN 17  CREATININE 1.11  CALCIUM 9.1    Liver Function Tests: Recent Labs    10/15/22 0730  AST 11  ALT 7*  BILITOT 0.7  PROT 6.5   No results for  input(s): "LIPASE", "AMYLASE" in the last 8760 hours. No results for input(s): "AMMONIA" in the last 8760 hours. CBC: Recent Labs    10/15/22 0730  WBC 6.8  NEUTROABS 4,869  HGB 14.3  HCT 41.4  MCV 95.0  PLT 182   Cardiac Enzymes: No results for input(s): "CKTOTAL", "CKMB", "CKMBINDEX", "TROPONINI" in the last 8760 hours. BNP: Invalid input(s): "POCBNP" No results found for: "HGBA1C" Lab Results  Component Value Date   TSH 2.02 10/15/2022   Lab Results  Component Value Date   VITAMINB12 465 07/04/2022   No results found for: "FOLATE" No results found for: "IRON", "TIBC", "FERRITIN"  Imaging and Procedures obtained prior to SNF admission: EEG adult  Result Date: 07/26/2022 Van Clines, MD     07/26/2022  1:16 PM ELECTROENCEPHALOGRAM REPORT Date of Study: 07/26/2022 Patient's Name: Phillip [name] MRN: [id number] Date of Birth: 03-12-1929 Referring Provider: Marlowe Kays, PA-C Clinical History: This is a 89 year old man with hallucinations, episode of urinary incontinence. EEG for classification. Medications: Eliquis, Lexapro, Synthroid, Proscar, Toprol, Simvastatin, Hytrin, Diovan Technical Summary: A multichannel digital EEG recording measured by the international 10-20 system with electrodes applied with paste and impedances below 5000 ohms performed in our laboratory with EKG monitoring in a predominantly drowsy and asleep patient.  Hyperventilation was not performed. Photic stimulation was performed.  The digital EEG was referentially recorded, reformatted, and digitally filtered in a variety of bipolar and referential montages for optimal display.  Description: The patient is predominantly drowsy and asleep during the recording.  During brief period of wakefulness, there is a symmetric, medium voltage 8 Hz posterior dominant rhythm that attenuates with eye opening.   The record is symmetric.  During drowsiness and sleep, there is an increase in theta and delta slowing of the background.  Vertex waves and symmetric sleep spindles were seen. Photic stimulation did not elicit any abnormalities.  There were no epileptiform discharges or electrographic seizures seen.  EKG lead was unremarkable. Impression: This predominantly drowsy and asleep EEG is normal.  Clinical Correlation: A normal EEG does not exclude a clinical diagnosis of epilepsy.  If further clinical questions remain, prolonged EEG may be helpful.  Clinical correlation is advised. Patrcia Dolly, M.D.    Assessment/Plan 1. Longstanding persistent atrial fibrillation (HCC) Symptoms managed with metoprolol for rate control and Eliquis  2. BPH with obstruction/lower urinary tract symptoms Seen by urology continues with terazosin and finasteride  3. COVID-19 virus infection Patient now out of isolation.  Has some weakness but denies any cough or fever  4. Essential hypertension Blood pressure managed with amlodipine valsartan today's blood pressure is 132/76  5. Mixed hyperlipidemia Continues with simvastatin 10 mg  6. Hypothyroidism, unspecified type TSH in therapeutic range on current 25 mcg levothyroxine  7. Mild cognitive impairment No meds.  Scored 29/30 on MMSE     Family/ staff Communication:   Labs/tests ordered:  Bertram Millard. Hyacinth Meeker, MD [facility] [address] [place], [place] [address] Office [phone]

## 2023-09-15 ENCOUNTER — Other Ambulatory Visit: Payer: Self-pay | Admitting: Internal Medicine

## 2023-09-15 ENCOUNTER — Encounter: Payer: Self-pay | Admitting: Nurse Practitioner

## 2023-09-15 ENCOUNTER — Other Ambulatory Visit: Payer: Self-pay | Admitting: Family Medicine

## 2023-09-15 ENCOUNTER — Other Ambulatory Visit: Payer: Self-pay | Admitting: Nurse Practitioner

## 2023-09-15 DIAGNOSIS — W19XXXA Unspecified fall, initial encounter: Secondary | ICD-10-CM | POA: Insufficient documentation

## 2023-09-16 ENCOUNTER — Non-Acute Institutional Stay: Payer: Medicare Other | Admitting: Nurse Practitioner

## 2023-09-16 ENCOUNTER — Encounter: Payer: Self-pay | Admitting: Nurse Practitioner

## 2023-09-16 DIAGNOSIS — N138 Other obstructive and reflux uropathy: Secondary | ICD-10-CM

## 2023-09-16 DIAGNOSIS — T148XXA Other injury of unspecified body region, initial encounter: Secondary | ICD-10-CM | POA: Diagnosis not present

## 2023-09-16 DIAGNOSIS — G3184 Mild cognitive impairment, so stated: Secondary | ICD-10-CM

## 2023-09-16 DIAGNOSIS — I10 Essential (primary) hypertension: Secondary | ICD-10-CM

## 2023-09-16 DIAGNOSIS — K219 Gastro-esophageal reflux disease without esophagitis: Secondary | ICD-10-CM

## 2023-09-16 DIAGNOSIS — I4811 Longstanding persistent atrial fibrillation: Secondary | ICD-10-CM

## 2023-09-16 DIAGNOSIS — E039 Hypothyroidism, unspecified: Secondary | ICD-10-CM

## 2023-09-16 DIAGNOSIS — E782 Mixed hyperlipidemia: Secondary | ICD-10-CM | POA: Diagnosis not present

## 2023-09-16 DIAGNOSIS — M19011 Primary osteoarthritis, right shoulder: Secondary | ICD-10-CM

## 2023-09-16 DIAGNOSIS — R269 Unspecified abnormalities of gait and mobility: Secondary | ICD-10-CM

## 2023-09-16 DIAGNOSIS — N401 Enlarged prostate with lower urinary tract symptoms: Secondary | ICD-10-CM

## 2023-09-16 NOTE — Progress Notes (Unsigned)
Location:  [facility] Room Number: 914-A Place of Service:  [phone]) Provider:  Haydin Dunn X, NP  Patient Care Team: Arayah Krouse X, NP as PCP - General (Internal Medicine) [name], MD as Consulting Physician (Dermatology) [place] Frederica Kuster, MD as Attending Physician [facility])  Extended Emergency Contact Information Primary Emergency Contact: [name],[name] Address: [address] [place], [place] [address] [place] Phone: [phone] Relation: Daughter Secondary Emergency Contact: [name] Mobile Phone: [phone] Relation: Daughter  Code Status:  Full Code Goals of care: Advanced Directive information    09/16/2023   10:21 AM  Advanced Directives  Does Patient Have a Medical Advance Directive? Yes  Type of Estate agent of [place];Living will  Does patient want to make changes to medical advance directive? No - Patient declined  Copy of Healthcare Power of Attorney in Chart? Yes - validated most recent copy scanned in chart (See row information)     Chief Complaint  Patient presents with   Acute Visit    s/p fall,    HPI:  Pt is a 89 y.o. male seen today for an acute visit for fall when the patient was noted sitting upright on floor beside his bed, the patient stated he rolled out of bed during sleep on left side and scooted to right side of bed, resulted abrasion R+L forearm under the elbow, no s/s of active bleeding or infection.     HTN, on Metoprolol, Valsartan,  Bun/creat 16/1.3 07/29/23             HLD on Simvastatin, LDL 55 07/29/23             Hypothyroidism, on Levothyroxine, TSH 3.92 07/29/23             BPH, urinary frequency, on Terazosin, Proscar, saw Urology 12/19/22             Afib, heart rate is in control, taking Eliquis, Metoprolol. F/u Cardiology             OA R shoulder, X-ray showed DJD, on Tylenol.               MD, recent diagnosed             GERD stable on Famotidine. Hgb 14.8 07/29/23             Depression, GDR Lexapro-off, stable.              Visual hallucination, resolved,  CT head negative, underwent neurology evaluation             Gait abnormality, uses walker, w/c to go further, risk of falling.              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 Vit B12 465 07/04/22. Saw neurology, 03/26/23 MOST full scope of tx, IVF/ABT indicated.    Past Medical History:  Diagnosis Date   Atrial fibrillation (HCC) 03/26/2011   Basal cell carcinoma of other specified sites of skin 4/10/012   Basal cell carcinoma of skin, site unspecified 03/25/2012   Benign neoplasm of colon 03/25/2012   adenomatous polyps   Broken arm 11/25/2017   Colon polyp 2014   Degeneration of lumbar or lumbosacral intervertebral disc 09/24/2011   Disturbance of skin sensation 01/28/2012   Diverticulosis 2014  Dysphagia, unspecified(787.20) 03/26/2011   Elevated prostate specific antigen (PSA) 03/26/2011   Exposure to unspecified radiation 03/26/2011   Family history of colon cancer 07/16/2013   GERD (gastroesophageal reflux disease) 07/16/2013   Hearing deficit 12/21/2013   Hemangioma of unspecified site 03/26/2011   Hx of adenomatous colonic polyps 07/16/2013   Hypertrophy of prostate with urinary obstruction and other lower urinary tract symptoms (LUTS) 03/26/2011   Ingrowing nail 03/26/2011   Internal hemorrhoids without mention of complication 06/15/13   Keratosis, actinic 01/28/2012   Long term (current) use of anticoagulants 03/26/2011   Long term current use of anticoagulant therapy 07/23/2016   Lumbago 03/26/2011   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   Osteoarthrosis, unspecified whether generalized or localized, unspecified site 11/24/2012   Other and unspecified hyperlipidemia 03/26/2011   Other malaise and fatigue 03/26/2011    Pain in joint, forearm 11/24/2012   Pain in joint, lower leg 06/15/13   left knee   Paresthesia of left arm 07/05/2014   Rectal bleed 06/15/13   Reflux esophagitis 03/26/2011   Sacroiliitis, not elsewhere classified (HCC) 05/28/2011   Scoliosis (and kyphoscoliosis), idiopathic 05/28/2011   Squamous cell carcinoma of skin of upper limb, including shoulder 03/26/2011   Tinnitus 12/21/2013   Transient ischemic attack (TIA), and cerebral infarction without residual deficits(V12.54) 03/26/2011   Unspecified essential hypertension 03/26/2011   Past Surgical History:  Procedure Laterality Date   BASAL CELL CARCINOMA EXCISION Left 12/10/14   Dr. [name]   COLONOSCOPY  08/12/2013   Dr. Rhea Belton adenomatous polyps   CRYOTHERAPY Left 2009   hand   PHOTODYNAMIC THERAPY  09/26/14   face and ear lobs  Dr. [name]   SQUAMOUS CELL CARCINOMA EXCISION Left 2010   hand    Allergies  Allergen Reactions   Ciprofloxacin Itching    Patient doesn't like the way it makes him feel   Lisinopril Itching    unknown   Sulfa Antibiotics Rash    Outpatient Encounter Medications as of 09/16/2023  Medication Sig   acetaminophen (TYLENOL) 500 MG tablet Take 500 mg by mouth daily.   amLODipine (NORVASC) 2.5 MG tablet TAKE 1 TABLET DAILY   cloNIDine (CATAPRES) 0.1 MG tablet Take 1 tablet (0.1 mg total) by mouth daily as needed. IF BP GREATER THAN 180/100   ELIQUIS 5 MG TABS tablet TAKE 1 TABLET TWICE A DAY FOR ANTICOAGULATION   finasteride (PROSCAR) 5 MG tablet TAKE 1 TABLET DAILY FOR URINATING DRIBBLING/PROSTATE ENLARGEMENT   levothyroxine (SYNTHROID) 25 MCG tablet TAKE 1 TABLET DAILY BEFORE BREAKFAST   metoprolol succinate (TOPROL-XL) 50 MG 24 hr tablet TAKE 1 TABLET TWICE A DAY   WITH OR IMMEDIATELY FOLLOWING A MEAL TO CONTROL BLOOD PRESSURE AND HEART RHYTHM   pantoprazole (PROTONIX) 40 MG tablet Take 1 tablet (40 mg total) by mouth daily.   simvastatin (ZOCOR) 10 MG tablet TAKE 1 TABLET DAILY TO LOWER  CHOLESTEROL   terazosin (HYTRIN) 2 MG capsule TAKE 1 CAPSULE DAILY   valsartan (DIOVAN) 320 MG tablet Take 1 tablet (320 mg total) by mouth daily.   No facility-administered encounter medications on file as of 09/16/2023.    Review of Systems  Constitutional:  Negative for appetite change, fatigue and fever.  HENT:  Positive for hearing loss and sore throat. Negative for congestion and trouble swallowing.        Scratchy throat  Eyes:  Negative for visual disturbance.  Respiratory:  Negative for cough, shortness of breath and wheezing.  Cardiovascular:  Negative for leg swelling.  Gastrointestinal:  Negative for abdominal pain and constipation.  Genitourinary:  Positive for frequency. Negative for dysuria and urgency.  Musculoskeletal:  Positive for arthralgias and gait problem.  Skin:  Positive for wound. Negative for color change.  Neurological:  Negative for speech difficulty, weakness and headaches.  Psychiatric/Behavioral:  Negative for confusion, hallucinations and sleep disturbance.        Feels better    Immunization History  Administered Date(s) Administered   Fluad Quad(high Dose 65+) 10/07/2022   Influenza Split 11/18/2007, 10/23/2009, 10/10/2010   Influenza Whole 09/15/2012, 09/16/2013   Influenza, High Dose Seasonal PF 09/24/2017, 09/20/2021   Influenza,inj,Quad PF,6+ Mos 09/17/2018   Influenza-Unspecified 09/29/2014, 09/14/2015, 09/26/2016, 09/05/2020   Moderna Covid-19 Vaccine Bivalent Booster 68yrs & up 09/05/2021, 10/17/2022   Moderna SARS-COV2 Booster Vaccination 12/31/2019, 05/23/2021   Moderna Sars-Covid-2 Vaccination 12/20/2019, 01/17/2020   PPD Test 10/10/2010, 11/27/2010   Pneumococcal Conjugate-13 02/08/2016   Pneumococcal Polysaccharide-23 01/17/1996   Pneumococcal-Unspecified 01/17/1996   Td 02/14/2006, 09/04/2016   Td (Adult), 2 Lf Tetanus Toxid, Preservative Free 02/14/2006, 09/04/2016   Tdap 03/05/2006   Zoster Recombinant(Shingrix) 03/31/2018,  06/23/2018   Zoster, Live 03/05/2006   Pertinent  Health Maintenance Due  Topic Date Due   INFLUENZA VACCINE  07/17/2023   Colonoscopy  Discontinued      06/22/2022    3:41 PM 07/04/2022    1:24 PM 10/21/2022    9:10 AM 03/20/2023    4:18 PM 05/07/2023    2:50 PM  Fall Risk  Falls in the past year?  1 1 0 0  Was there an injury with Fall?  0 1 0 0  Fall Risk Category Calculator  2 3 0 0  Fall Risk Category (Retired)  Moderate High    (RETIRED) Patient Fall Risk Level Moderate fall risk High fall risk High fall risk    Patient at Risk for Falls Due to    No Fall Risks   Fall risk Follow up   Falls evaluation completed Falls evaluation completed Falls evaluation completed   Functional Status Survey:    Vitals:   09/16/23 1013  BP: 126/68  Pulse: 62  Resp: 20  Temp: 98.1 F (36.7 C)  SpO2: 97%  Weight: 193 lb 9.6 oz (87.8 kg)  Height: 5\' 7"  (1.702 m)   Body mass index is 30.32 kg/m. Physical Exam Constitutional:      Appearance: Normal appearance.  HENT:     Head: Normocephalic and atraumatic.     Nose: Nose normal.     Mouth/Throat:     Mouth: Mucous membranes are moist.  Eyes:     Extraocular Movements: Extraocular movements intact.     Conjunctiva/sclera: Conjunctivae normal.     Pupils: Pupils are equal, round, and reactive to light.  Cardiovascular:     Rate and Rhythm: Normal rate. Rhythm irregular.     Heart sounds: No murmur heard. Pulmonary:     Effort: Pulmonary effort is normal.     Breath sounds: No rales.  Abdominal:     Palpations: Abdomen is soft.     Tenderness: There is no abdominal tenderness.  Musculoskeletal:        General: Normal range of motion.     Cervical back: Normal range of motion and neck supple.     Right lower leg: No edema.     Left lower leg: No edema.  Skin:    General: Skin is warm and dry.  Findings: Bruising present.     Comments: abrasion R+L forearm under the elbow, no s/s of active bleeding or infection.    Neurological:     General: No focal deficit present.     Mental Status: He is alert and oriented to person, place, and time. Mental status is at baseline.     Gait: Gait abnormal.  Psychiatric:        Mood and Affect: Mood normal.        Behavior: Behavior normal.        Thought Content: Thought content normal.     Labs reviewed: Recent Labs    10/15/22 0730 07/29/23 0000  NA 141 142  K 4.3 4.1  CL 106 106  CO2 29 28*  GLUCOSE 80  --   BUN 17 16  CREATININE 1.11 1.3  CALCIUM 9.1 9.1   Recent Labs    10/15/22 0730 07/29/23 0000  AST 11 16  ALT 7* 10  ALKPHOS  --  81  BILITOT 0.7  --   PROT 6.5  --   ALBUMIN  --  3.7   Recent Labs    10/15/22 0730 07/29/23 0000  WBC 6.8 6.0  NEUTROABS 4,869 4,008.00  HGB 14.3 14.8  HCT 41.4 44  MCV 95.0  --   PLT 182 176   Lab Results  Component Value Date   TSH 3.92 07/29/2023   No results found for: "HGBA1C" Lab Results  Component Value Date   CHOL 111 07/29/2023   HDL 37 07/29/2023   LDLCALC 55 07/29/2023   TRIG 108 07/29/2023   CHOLHDL 2.5 10/15/2022    Significant Diagnostic Results in last 30 days:  No results found.  Assessment/Plan Abrasion of skin  fall when the patient was noted sitting upright on floor beside his bed, the patient stated he rolled out of bed during sleepi on left side and scooted to right side of bed, resulted abrasion R+L forearm under the elbow, no s/s of active bleeding or infection.    Essential hypertension Blood pressure is controlled, on Metoprolol, Valsartan,  Bun/creat 16/1.3 07/29/23  Hyperlipidemia on Simvastatin, LDL 55 07/29/23  Hypothyroidism on Levothyroxine, TSH 3.92 07/29/23  BPH with obstruction/lower urinary tract symptoms urinary frequency, on Terazosin, Proscar, saw Urology 12/19/22  Atrial fibrillation (HCC) heart rate is in control, taking Eliquis, Metoprolol. F/u Cardiology  Osteoarthritis of right shoulder R shoulder, X-ray showed DJD, on Tylenol.    GERD (gastroesophageal reflux disease) stable on Famotidine. Hgb 14.8 07/29/23  Gait abnormality  uses walker, risk of falling.   Mild cognitive impairment  uses walker, w/c to go further, risk of falling. 03/26/23 MOST full scope of tx, IVF/ABT indicated. Functional in AL setting. 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 Vit B12 465 07/04/22. Saw neurology     Family/ staff Communication: plan of care reviewed with the patient and charge nurse.   Labs/tests ordered:  none  Time spend 40 minutes.

## 2023-09-16 NOTE — Assessment & Plan Note (Addendum)
uses walker, w/c to go further, risk of falling. 03/26/23 MOST full scope of tx, IVF/ABT indicated. Functional in AL setting. 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 Vit B12 465 07/04/22. Saw neurology

## 2023-09-16 NOTE — Assessment & Plan Note (Signed)
R shoulder, X-ray showed DJD, on Tylenol.

## 2023-09-16 NOTE — Assessment & Plan Note (Signed)
on Levothyroxine, TSH 3.92 07/29/23

## 2023-09-16 NOTE — Assessment & Plan Note (Signed)
heart rate is in control, taking Eliquis, Metoprolol. F/u Cardiology 

## 2023-09-16 NOTE — Assessment & Plan Note (Signed)
on Simvastatin, LDL 55 07/29/23

## 2023-09-16 NOTE — Assessment & Plan Note (Signed)
Blood pressure is controlled, on Metoprolol, Valsartan,  Bun/creat 16/1.3 07/29/23

## 2023-09-16 NOTE — Assessment & Plan Note (Signed)
uses walker, risk of falling.

## 2023-09-16 NOTE — Assessment & Plan Note (Signed)
urinary frequency, on Terazosin, Proscar, saw Urology 12/19/22

## 2023-09-16 NOTE — Assessment & Plan Note (Signed)
fall when the patient was noted sitting upright on floor beside his bed, the patient stated he rolled out of bed during sleepi on left side and scooted to right side of bed, resulted abrasion R+L forearm under the elbow, no s/s of active bleeding or infection.

## 2023-09-16 NOTE — Assessment & Plan Note (Signed)
stable on Famotidine. Hgb 14.8 07/29/23

## 2023-10-21 ENCOUNTER — Encounter: Payer: Self-pay | Admitting: Nurse Practitioner

## 2023-10-21 ENCOUNTER — Non-Acute Institutional Stay: Payer: Medicare Other | Admitting: Nurse Practitioner

## 2023-10-21 DIAGNOSIS — E039 Hypothyroidism, unspecified: Secondary | ICD-10-CM

## 2023-10-21 DIAGNOSIS — N138 Other obstructive and reflux uropathy: Secondary | ICD-10-CM

## 2023-10-21 DIAGNOSIS — I10 Essential (primary) hypertension: Secondary | ICD-10-CM

## 2023-10-21 DIAGNOSIS — E782 Mixed hyperlipidemia: Secondary | ICD-10-CM

## 2023-10-21 DIAGNOSIS — K219 Gastro-esophageal reflux disease without esophagitis: Secondary | ICD-10-CM

## 2023-10-21 DIAGNOSIS — N401 Enlarged prostate with lower urinary tract symptoms: Secondary | ICD-10-CM

## 2023-10-21 DIAGNOSIS — I4811 Longstanding persistent atrial fibrillation: Secondary | ICD-10-CM

## 2023-10-21 DIAGNOSIS — G3184 Mild cognitive impairment, so stated: Secondary | ICD-10-CM

## 2023-10-21 NOTE — Assessment & Plan Note (Signed)
heart rate is in control, taking Eliquis, Metoprolol. F/u Cardiology. 10/20/23 reported the patient's gasps for air at times before taking the next breath, the patient stated its not new, the patient's daughter has noticed more frequent of the event. Denied chest pain/palpitation, cough, chest pain, nausea, vomiting, or abd discomfort, weight gained about #3Ibs in the past month.  Will monitor weight weekly x4 EKG, CXR ap/lateral ST to eval and tx.

## 2023-10-21 NOTE — Assessment & Plan Note (Signed)
urinary frequency, on Terazosin, Proscar, saw Urology 12/19/22

## 2023-10-21 NOTE — Assessment & Plan Note (Signed)
on Levothyroxine, TSH 3.92 07/29/23

## 2023-10-21 NOTE — Progress Notes (Unsigned)
Location:   [facility] Room Number: 914 Place of Service:  ALF (13) Provider: Arna Snipe Zarin Hagmann NP  Evangelos Paulino X, NP  Patient Care Team: Prince Olivier X, NP as PCP - General (Internal Medicine) [name], MD as Consulting Physician (Dermatology) [place] Frederica Kuster, MD as Attending Physician [facility])  Extended Emergency Contact Information Primary Emergency Contact: [name],[name] Address: [address] [place], [place] [address] [place] Phone: [phone] Relation: Daughter Secondary Emergency Contact: [name] Mobile Phone: [phone] Relation: Daughter  Code Status:  DNR Goals of care: Advanced Directive information    09/16/2023   10:21 AM  Advanced Directives  Does Patient Have a Medical Advance Directive? Yes  Type of Estate agent of [place];Living will  Does patient want to make changes to medical advance directive? No - Patient declined  Copy of Healthcare Power of Attorney in Chart? Yes - validated most recent copy scanned in chart (See row information)     Chief Complaint  Patient presents with  . Medical Management of Chronic Issues    HPI:  Pt is a 89 y.o. male seen today for medical management of chronic diseases.     HTN, on Metoprolol, Valsartan, prn Clonidine,  Bun/creat 18/1.08 10/03/23             HLD on Simvastatin, LDL 55 07/29/23             Hypothyroidism, on Levothyroxine, TSH 3.92 07/29/23             BPH, urinary frequency, on Terazosin, Proscar, saw Urology 12/19/22             Afib, heart rate is in control, taking Eliquis, Metoprolol. F/u Cardiology. 10/20/23 reported the patient's gasps for air at times before taking the next breath, the patient stated its not new, the patient's daughter has noticed more frequent of the event. Denied chest pain/palpitation, cough, chest pain, nausea, vomiting, or abd discomfort, weight gained  about #3Ibs in the past month.              OA R shoulder, X-ray showed DJD, on Tylenol.              MD, recent diagnosed             GERD stable on Pantoprazole. Hgb 14.8 10/03/23             Depression, GDR Lexapro-off, stable.              Visual hallucination, resolved,  CT head negative, underwent neurology evaluation             Gait abnormality, uses walker, w/c to go further, risk of falling.              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 MMSE 29/30 03/21/23, CT head microvascular ischemic disease and cerebral atrophy. Vit B12 465 07/04/22. Saw neurology, 03/26/23 MOST full scope of tx, IVF/ABT indicated.      Past Medical History:  Diagnosis Date  . Atrial fibrillation (HCC) 03/26/2011  . Basal cell carcinoma of other specified sites of skin 4/10/012  . Basal cell carcinoma of skin, site unspecified 03/25/2012  . Benign neoplasm of colon 03/25/2012   adenomatous polyps  . Broken arm 11/25/2017  . Colon polyp 2014  . Degeneration of lumbar or lumbosacral intervertebral disc 09/24/2011  . Disturbance  of skin sensation 01/28/2012  . Diverticulosis 2014  . Dysphagia, unspecified(787.20) 03/26/2011  . Elevated prostate specific antigen (PSA) 03/26/2011  . Exposure to unspecified radiation 03/26/2011  . Family history of colon cancer 07/16/2013  . GERD (gastroesophageal reflux disease) 07/16/2013  . Hearing deficit 12/21/2013  . Hemangioma of unspecified site 03/26/2011  . Hx of adenomatous colonic polyps 07/16/2013  . Hypertrophy of prostate with urinary obstruction and other lower urinary tract symptoms (LUTS) 03/26/2011  . Ingrowing nail 03/26/2011  . Internal hemorrhoids without mention of complication 06/15/13  . Keratosis, actinic 01/28/2012  . Long term (current) use of anticoagulants 03/26/2011  . Long term current use of anticoagulant therapy 07/23/2016  . Lumbago 03/26/2011  . 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 Osteoarthrosis, unspecified  whether generalized or localized, unspecified site 11/24/2012  . Other and unspecified hyperlipidemia 03/26/2011  . Other malaise and fatigue 03/26/2011  . Pain in joint, forearm 11/24/2012  . Pain in joint, lower leg 06/15/13   left knee  . Paresthesia of left arm 07/05/2014  . Rectal bleed 06/15/13  . Reflux esophagitis 03/26/2011  . Sacroiliitis, not elsewhere classified (HCC) 05/28/2011  . Scoliosis (and kyphoscoliosis), idiopathic 05/28/2011  . 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 Tinnitus 12/21/2013  . 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 Unspecified essential hypertension 03/26/2011   Past Surgical History:  Procedure Laterality Date  . BASAL CELL CARCINOMA EXCISION Left 12/10/14   Dr. [name]  . COLONOSCOPY  08/12/2013   Dr. Rhea Belton adenomatous polyps  . CRYOTHERAPY Left 2009   hand  . PHOTODYNAMIC THERAPY  09/26/14   face and ear lobs  Dr. [name]  . SQUAMOUS CELL CARCINOMA EXCISION Left 2010   hand    Allergies  Allergen Reactions  . Ciprofloxacin Itching    Patient doesn't like the way it makes him feel  . Lisinopril Itching    unknown  . Sulfa Antibiotics Rash    Allergies as of 10/21/2023       Reactions   Ciprofloxacin Itching   Patient doesn't like the way it makes him feel   Lisinopril Itching   unknown   Sulfa Antibiotics Rash        Medication List        Accurate as of October 21, 2023  4:45 PM. If you have any questions, ask your nurse or doctor.          acetaminophen 500 MG tablet Commonly known as: TYLENOL Take 500 mg by mouth daily.   amLODipine 2.5 MG tablet Commonly known as: NORVASC TAKE 1 TABLET DAILY   cloNIDine 0.1 MG tablet Commonly known as: CATAPRES Take 1 tablet (0.1 mg total) by mouth daily as needed. IF BP GREATER THAN 180/100   Eliquis 5 MG Tabs tablet Generic drug: apixaban TAKE 1 TABLET TWICE A DAY FOR  ANTICOAGULATION   finasteride 5 MG tablet Commonly known as: PROSCAR TAKE 1 TABLET DAILY FOR URINATING DRIBBLING/PROSTATE ENLARGEMENT   levothyroxine 25 MCG tablet Commonly known as: SYNTHROID TAKE 1 TABLET DAILY BEFORE BREAKFAST   metoprolol succinate 50 MG 24 hr tablet Commonly known as: TOPROL-XL TAKE 1 TABLET TWICE A DAY   WITH OR IMMEDIATELY FOLLOWING A MEAL TO CONTROL BLOOD PRESSURE AND HEART RHYTHM   pantoprazole 40 MG tablet Commonly known as: PROTONIX Take 1 tablet (40 mg total) by mouth daily.   simvastatin 10 MG tablet  Commonly known as: ZOCOR TAKE 1 TABLET DAILY TO LOWER CHOLESTEROL   terazosin 2 MG capsule Commonly known as: HYTRIN TAKE 1 CAPSULE DAILY   valsartan 320 MG tablet Commonly known as: DIOVAN Take 1 tablet (320 mg total) by mouth daily.        Review of Systems  Constitutional:  Negative for appetite change, fatigue and fever.  HENT:  Positive for hearing loss. Negative for congestion and trouble swallowing.   Eyes:  Negative for visual disturbance.  Respiratory:  Negative for cough, shortness of breath and wheezing.   Cardiovascular:  Negative for leg swelling.  Gastrointestinal:  Negative for abdominal pain and constipation.  Genitourinary:  Positive for frequency. Negative for dysuria and urgency.  Musculoskeletal:  Positive for arthralgias and gait problem.  Skin:  Negative for color change.  Neurological:  Negative for speech difficulty, weakness and headaches.  Psychiatric/Behavioral:  Negative for confusion, hallucinations and sleep disturbance.        Feels better    Immunization History  Administered Date(s) Administered  . Fluad Quad(high Dose 65+) 10/07/2022  . Influenza Split 11/18/2007, 10/23/2009, 10/10/2010  . Influenza Whole 09/15/2012, 09/16/2013  . Influenza, High Dose Seasonal PF 09/24/2017, 09/20/2021  . Influenza,inj,Quad PF,6+ Mos 09/17/2018  . Influenza-Unspecified 09/29/2014, 09/14/2015, 09/26/2016, 09/05/2020  .  Moderna Covid-19 Vaccine Bivalent Booster 76yrs & up 09/05/2021, 10/17/2022  . Moderna SARS-COV2 Booster Vaccination 12/31/2019, 05/23/2021  . Moderna Sars-Covid-2 Vaccination 12/20/2019, 01/17/2020  . PPD Test 10/10/2010, 11/27/2010  . Pneumococcal Conjugate-13 02/08/2016  . Pneumococcal Polysaccharide-23 01/17/1996  . Pneumococcal-Unspecified 01/17/1996  . Td 02/14/2006, 09/04/2016  . Td (Adult), 2 Lf Tetanus Toxid, Preservative Free 02/14/2006, 09/04/2016  . Tdap 03/05/2006  . Zoster Recombinant(Shingrix) 03/31/2018, 06/23/2018  . Zoster, Live 03/05/2006   Pertinent  Health Maintenance Due  Topic Date Due  . INFLUENZA VACCINE  07/17/2023  . Colonoscopy  Discontinued      06/22/2022    3:41 PM 07/04/2022    1:24 PM 10/21/2022    9:10 AM 03/20/2023    4:18 PM 05/07/2023    2:50 PM  Fall Risk  Falls in the past year?  1 1 0 0  Was there an injury with Fall?  0 1 0 0  Fall Risk Category Calculator  2 3 0 0  Fall Risk Category (Retired)  Moderate High    (RETIRED) Patient Fall Risk Level Moderate fall risk High fall risk High fall risk    Patient at Risk for Falls Due to    No Fall Risks   Fall risk Follow up   Falls evaluation completed Falls evaluation completed Falls evaluation completed   Functional Status Survey:    Vitals:   10/21/23 1210  BP: 132/84  Pulse: 70  Resp: 18  Temp: 97.9 F (36.6 C)  SpO2: 99%  Weight: 196 lb 9.6 oz (89.2 kg)   Body mass index is 30.79 kg/m. Physical Exam Constitutional:      Appearance: Normal appearance.  HENT:     Head: Normocephalic and atraumatic.     Nose: Nose normal.     Mouth/Throat:     Mouth: Mucous membranes are moist.  Eyes:     Extraocular Movements: Extraocular movements intact.     Conjunctiva/sclera: Conjunctivae normal.     Pupils: Pupils are equal, round, and reactive to light.  Cardiovascular:     Rate and Rhythm: Normal rate. Rhythm irregular.     Heart sounds: No murmur heard. Pulmonary:     Effort:  Pulmonary effort is normal.     Breath sounds: No rales.  Abdominal:     Palpations: Abdomen is soft.     Tenderness: There is no abdominal tenderness.  Musculoskeletal:        General: Normal range of motion.     Cervical back: Normal range of motion and neck supple.     Right lower leg: No edema.     Left lower leg: No edema.  Skin:    General: Skin is warm and dry.  Neurological:     General: No focal deficit present.     Mental Status: He is alert and oriented to person, place, and time. Mental status is at baseline.     Gait: Gait abnormal.  Psychiatric:        Mood and Affect: Mood normal.        Behavior: Behavior normal.        Thought Content: Thought content normal.    Labs reviewed: Recent Labs    07/29/23 0000  NA 142  K 4.1  CL 106  CO2 28*  BUN 16  CREATININE 1.3  CALCIUM 9.1   Recent Labs    07/29/23 0000  AST 16  ALT 10  ALKPHOS 81  ALBUMIN 3.7   Recent Labs    07/29/23 0000  WBC 6.0  NEUTROABS 4,008.00  HGB 14.8  HCT 44  PLT 176   Lab Results  Component Value Date   TSH 3.92 07/29/2023   No results found for: "HGBA1C" Lab Results  Component Value Date   CHOL 111 07/29/2023   HDL 37 07/29/2023   LDLCALC 55 07/29/2023   TRIG 108 07/29/2023   CHOLHDL 2.5 10/15/2022    Significant Diagnostic Results in last 30 days:  No results found.  Assessment/Plan  Atrial fibrillation (HCC) heart rate is in control, taking Eliquis, Metoprolol. F/u Cardiology. 10/20/23 reported the patient's gasps for air at times before taking the next breath, the patient stated its not new, the patient's daughter has noticed more frequent of the event. Denied chest pain/palpitation, cough, chest pain, nausea, vomiting, or abd discomfort, weight gained about #3Ibs in the past month.  Will monitor weight weekly x4 EKG, CXR ap/lateral ST to eval and tx.   Essential hypertension  on Metoprolol, Valsartan,  Bun/creat 18/1.08 10/03/23  Hyperlipidemia on  Simvastatin, LDL 55 07/29/23  Hypothyroidism  on Levothyroxine, TSH 3.92 07/29/23  BPH with obstruction/lower urinary tract symptoms  urinary frequency, on Terazosin, Proscar, saw Urology 12/19/22  GERD (gastroesophageal reflux disease) stable on Pantoprazole,  Hgb 14.8 10/03/23  Mild cognitive impairment functional in AL setting. MMSE 29/30 4/5/2, CT head microvascular ischemic disease and cerebral atrophy. Vit B12 465 07/04/22. Saw neurology, 03/26/23 MOST full scope of tx, IVF/ABT indicated.      Family/ staff Communication: plan of care reviewed with the patient, the patient's daughter, and charge nurse.   Labs/tests ordered:  EKG, CXR ap/lateral.   Time spend 40 minutes.

## 2023-10-21 NOTE — Assessment & Plan Note (Signed)
stable on Pantoprazole,  Hgb 14.8 10/03/23

## 2023-10-21 NOTE — Assessment & Plan Note (Signed)
functional in AL setting. MMSE 29/30 4/5/2, CT head microvascular ischemic disease and cerebral atrophy. Vit B12 465 07/04/22. Saw neurology, 03/26/23 MOST full scope of tx, IVF/ABT indicated.

## 2023-10-21 NOTE — Assessment & Plan Note (Signed)
on Metoprolol, Valsartan,  Bun/creat 18/1.08 10/03/23

## 2023-10-21 NOTE — Assessment & Plan Note (Signed)
on Simvastatin, LDL 55 07/29/23

## 2023-10-29 IMAGING — DX DG SHOULDER 2+V*L*
3 series · 3 of 3 positions shown · non-contrast
Comparison: None Available.

CLINICAL DATA: Fall and pain.

EXAM:
LEFT SHOULDER - 2+ VIEW

[shoulder grashey]
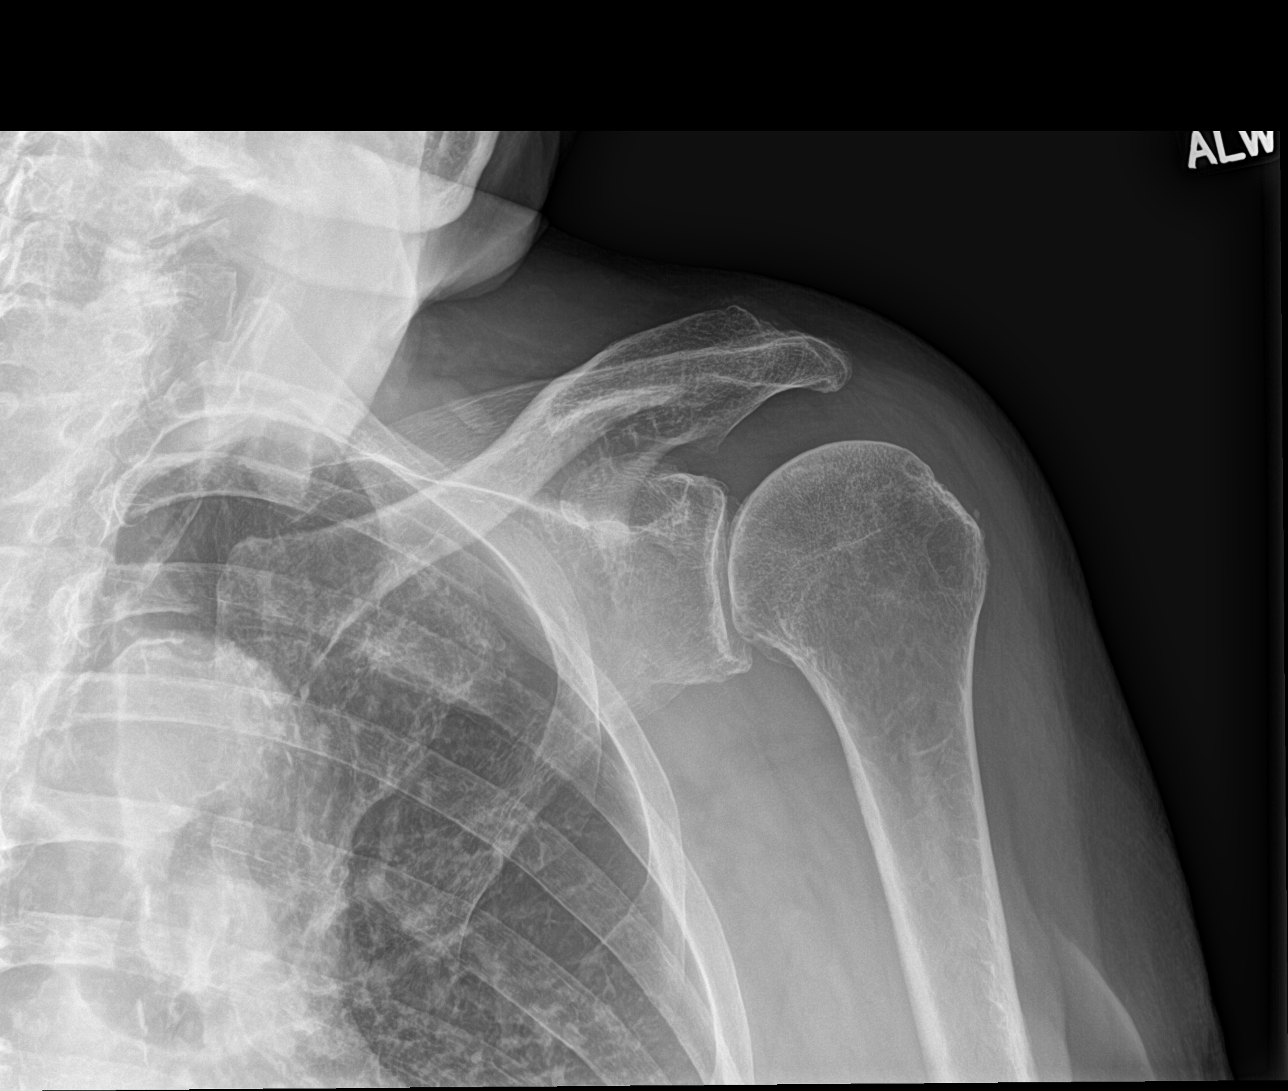

[shoulder axillary]
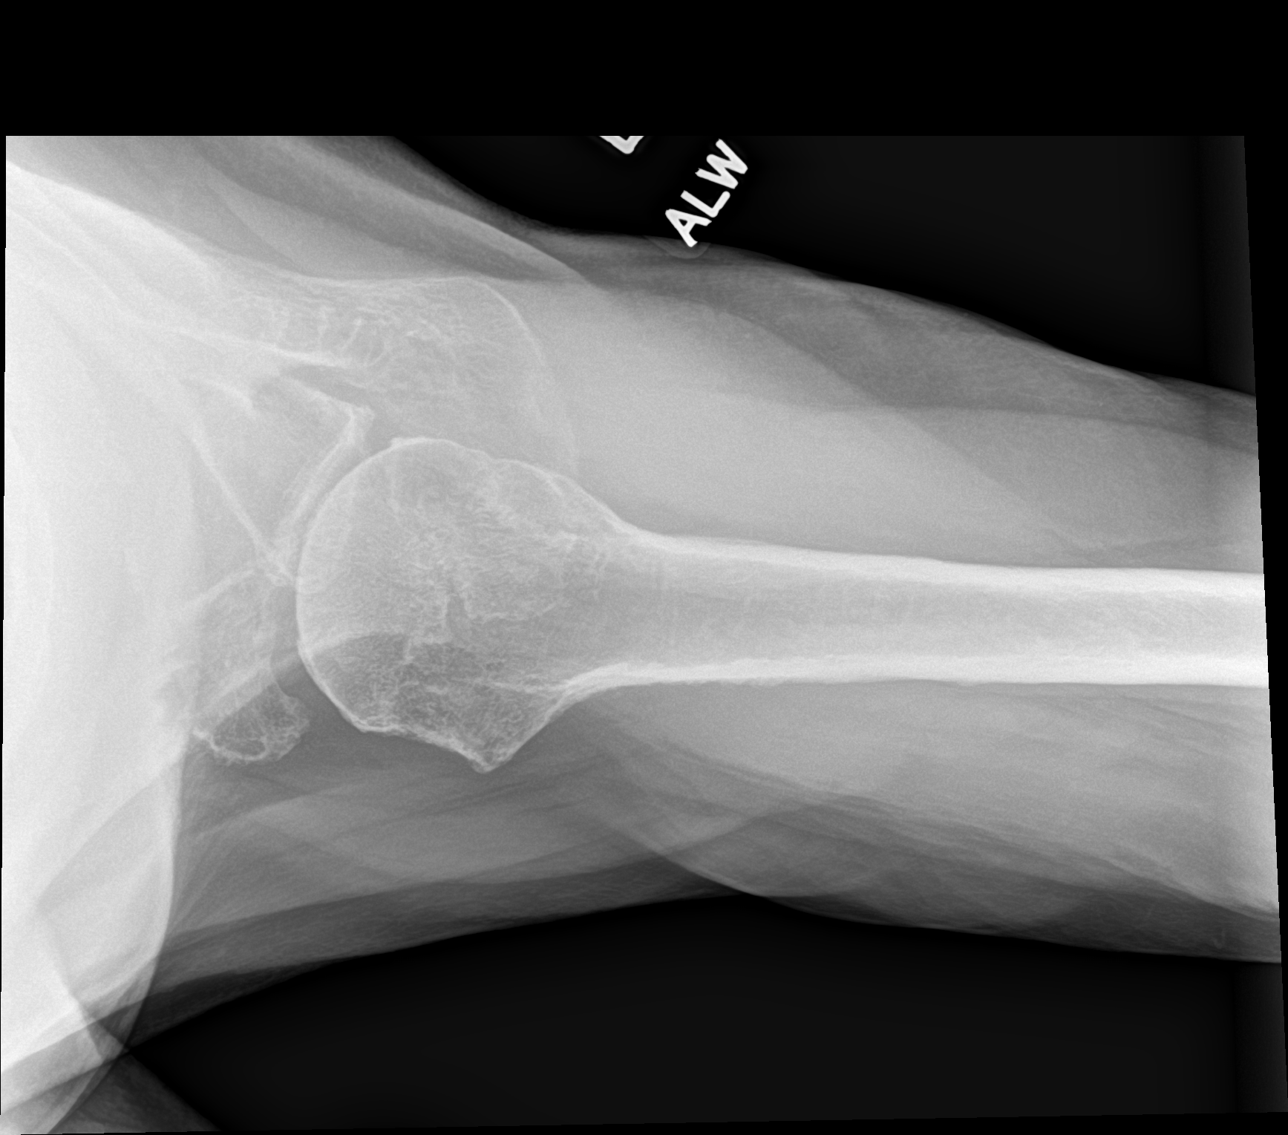

[shoulder y view]
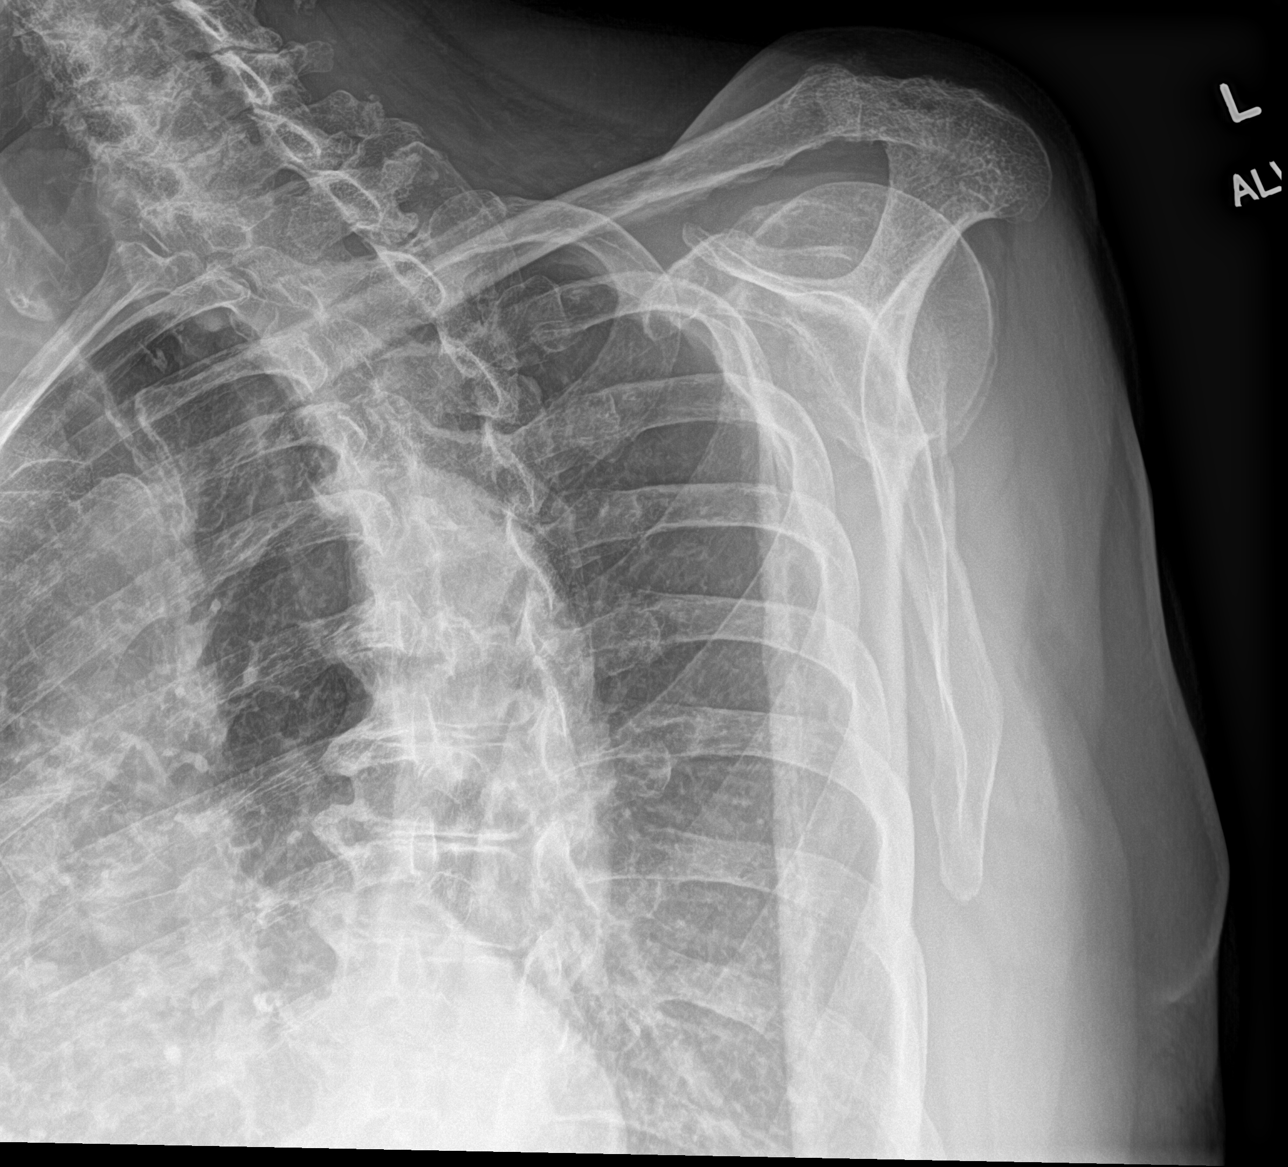

[3 of 3 positions shown; findings below may reference images not displayed]

FINDINGS: Mild glenohumeral degenerative changes. No fractures or
dislocations. Limited views of the left chest are normal.
IMPRESSION: Mild glenohumeral degenerative changes. No other acute
abnormalities.

## 2023-10-29 IMAGING — DX DG THORACIC SPINE 2V
3 series · 3 of 3 positions shown · non-contrast
Comparison: None Available.

CLINICAL DATA: Fall with pain

EXAM:
THORACIC SPINE 2 VIEWS

[t-spine ap]
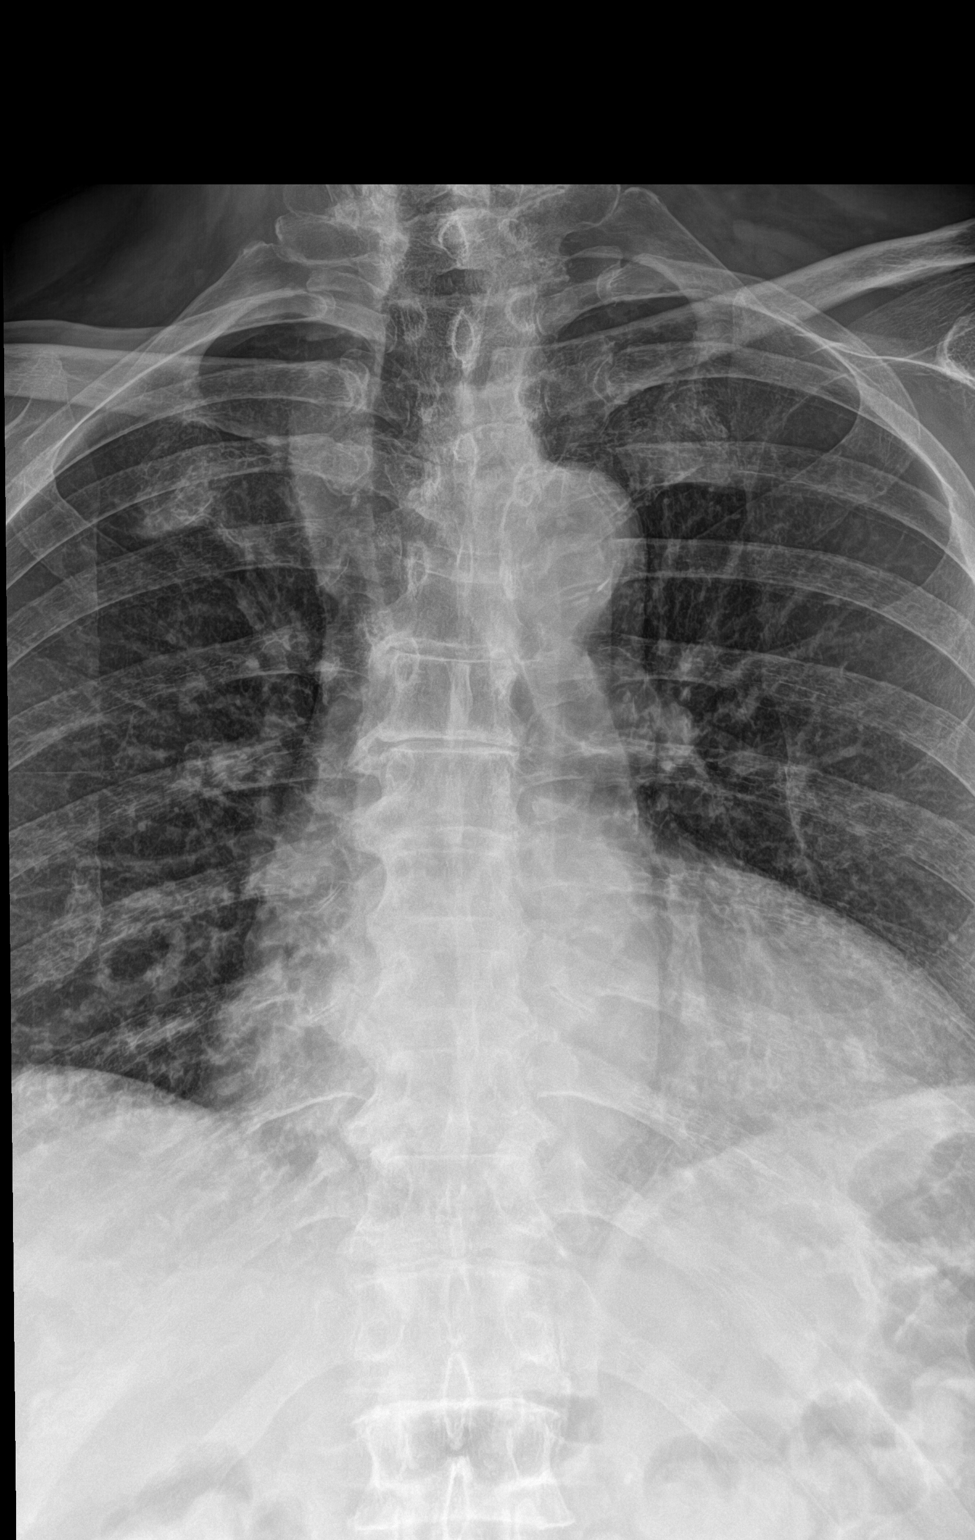

[t-spine lat (1 of 2)]
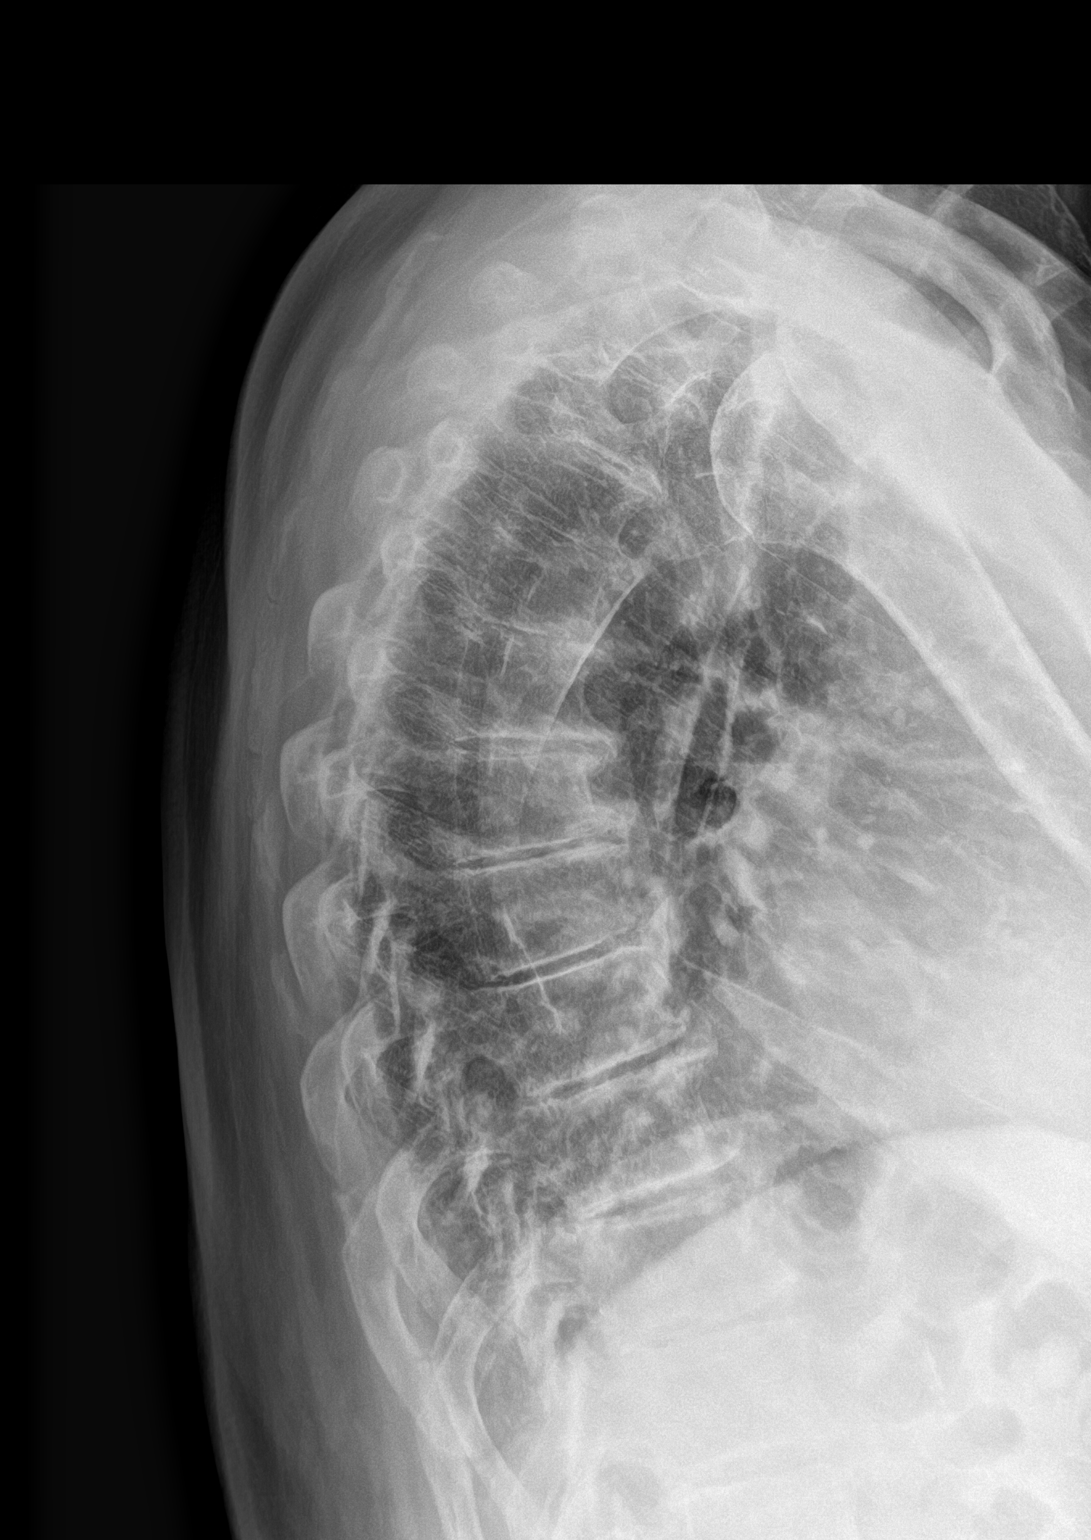

[t-spine lat (2 of 2)]
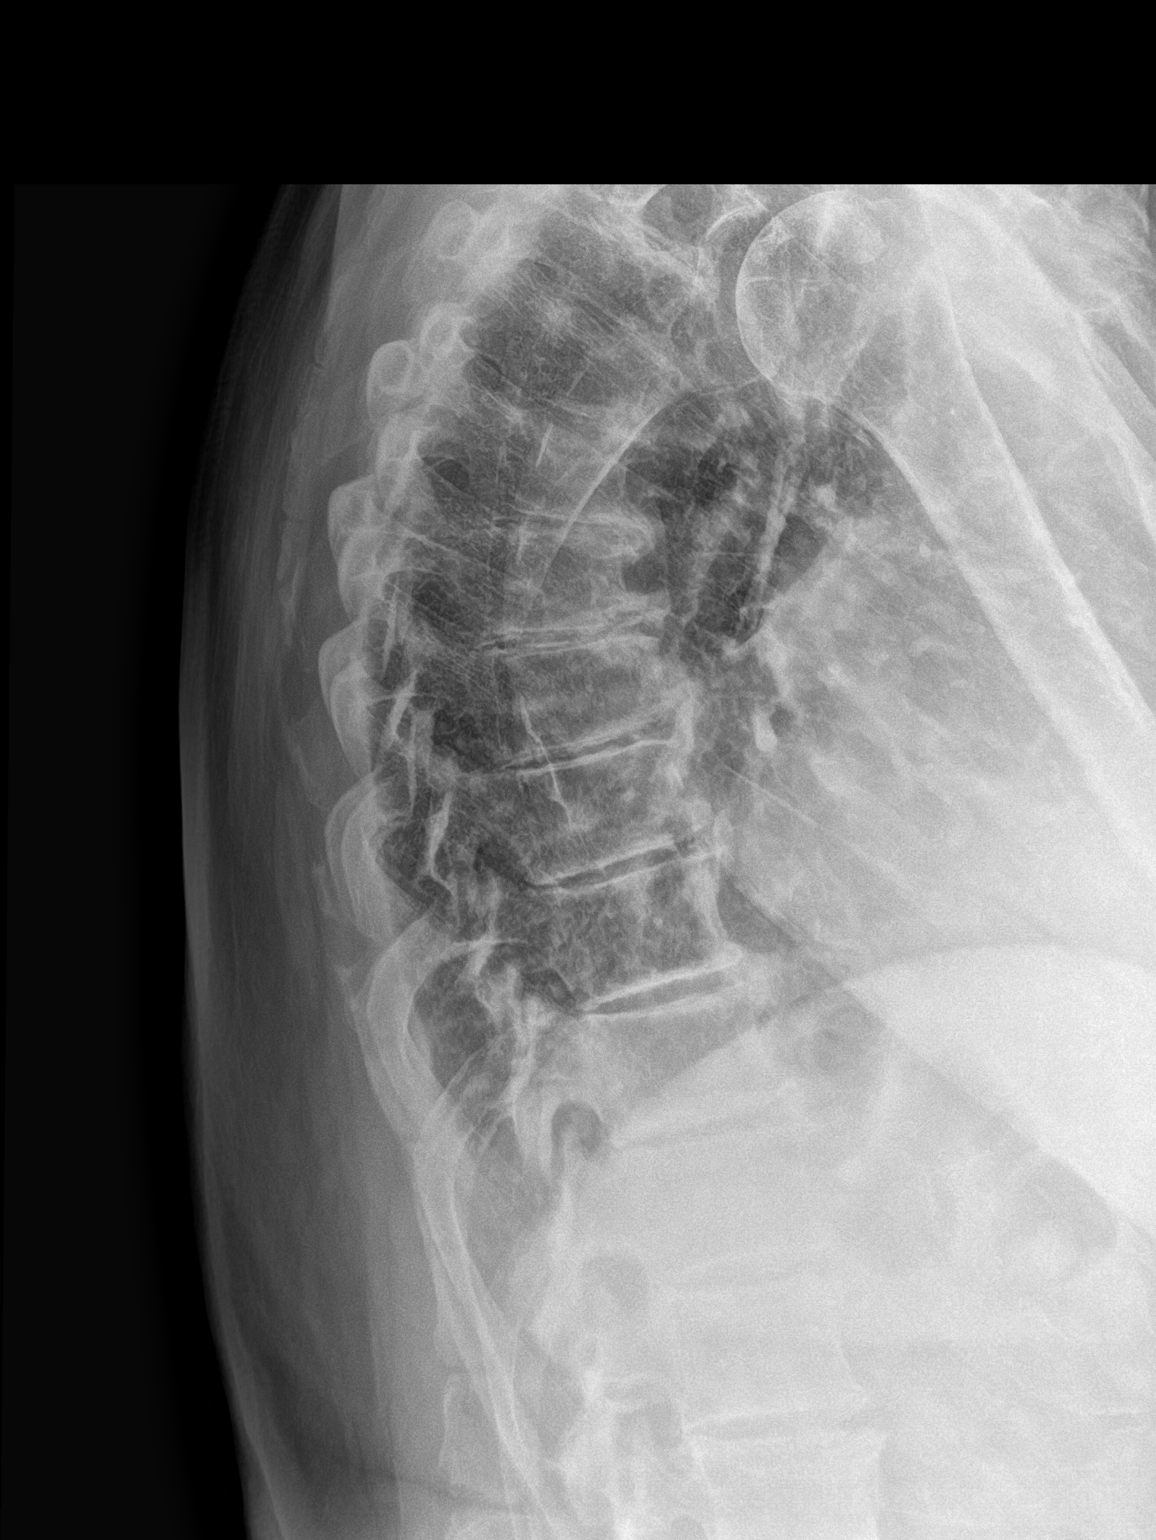

[3 of 3 positions shown; findings below may reference images not displayed]

FINDINGS: Mild thoracic kyphosis without evidence for acute fracture.
Multilevel degenerative osteophytes.
IMPRESSION: Multilevel degenerative changes.  No acute osseous abnormality.

## 2023-12-26 ENCOUNTER — Other Ambulatory Visit: Payer: Self-pay | Admitting: Internal Medicine

## 2024-01-06 ENCOUNTER — Non-Acute Institutional Stay: Payer: Self-pay | Admitting: Nurse Practitioner

## 2024-01-06 ENCOUNTER — Encounter: Payer: Self-pay | Admitting: Nurse Practitioner

## 2024-01-06 DIAGNOSIS — Z0000 Encounter for general adult medical examination without abnormal findings: Secondary | ICD-10-CM | POA: Diagnosis not present

## 2024-01-06 NOTE — Progress Notes (Signed)
Subjective:   Phillip [name] is a 89 y.o. male who presents for Medicare Annual/Subsequent preventive examination.  Visit Complete: In person  Patient Medicare AWV questionnaire was completed by the patient on 01/06/24; I have confirmed that all information answered by patient is correct and no changes since this date.  Cardiac Risk Factors include: advanced age (>36men, >13 women);dyslipidemia;family history of premature cardiovascular disease;hypertension;male gender;obesity (BMI >30kg/m2) (aged of 65)     Objective:    Today's Vitals   01/06/24 1326  BP: (!) 157/78  Pulse: 67  Resp: 19  Temp: 97.6 F (36.4 C)  SpO2: 98%  Weight: 199 lb 12.8 oz (90.6 kg)   Body mass index is 31.29 kg/m.     09/16/2023   10:21 AM 07/25/2023   12:15 PM 05/07/2023    2:52 PM 11/27/2022   11:39 AM 10/21/2022    9:10 AM 07/04/2022    1:24 PM 06/28/2022   12:26 PM  Advanced Directives  Does Patient Have a Medical Advance Directive? Yes Yes No Yes Yes Yes No  Type of Estate agent of [place];Living will Healthcare Power of [place];Living will  Healthcare Power of [place];Living will     Does patient want to make changes to medical advance directive? No - Patient declined No - Patient declined  No - Patient declined   No - Patient declined  Copy of Healthcare Power of Attorney in Chart? Yes - validated most recent copy scanned in chart (See row information) Yes - validated most recent copy scanned in chart (See row information)  Yes - validated most recent copy scanned in chart (See row information)     Would patient like information on creating a medical advance directive?   No - Patient declined        Current Medications (verified) Outpatient Encounter Medications as of 01/06/2024  Medication Sig   acetaminophen (TYLENOL) 500 MG tablet Take 500 mg by mouth daily.   amLODipine (NORVASC) 2.5 MG tablet TAKE 1 TABLET DAILY   cloNIDine (CATAPRES) 0.1 MG tablet Take 1 tablet (0.1  mg total) by mouth daily as needed. IF BP GREATER THAN 180/100   ELIQUIS 5 MG TABS tablet TAKE 1 TABLET TWICE A DAY FOR ANTICOAGULATION   finasteride (PROSCAR) 5 MG tablet TAKE 1 TABLET DAILY FOR URINATING DRIBBLING/PROSTATE ENLARGEMENT   levothyroxine (SYNTHROID) 25 MCG tablet TAKE 1 TABLET DAILY BEFORE BREAKFAST   metoprolol succinate (TOPROL-XL) 50 MG 24 hr tablet TAKE 1 TABLET TWICE A DAY   WITH OR IMMEDIATELY FOLLOWING A MEAL TO CONTROL BLOOD PRESSURE AND HEART RHYTHM   pantoprazole (PROTONIX) 40 MG tablet Take 1 tablet (40 mg total) by mouth daily.   simvastatin (ZOCOR) 10 MG tablet TAKE 1 TABLET DAILY TO LOWER CHOLESTEROL   terazosin (HYTRIN) 2 MG capsule TAKE 1 CAPSULE DAILY   valsartan (DIOVAN) 320 MG tablet Take 1 tablet (320 mg total) by mouth daily.   No facility-administered encounter medications on file as of 01/06/2024.    Allergies (verified) Ciprofloxacin, Lisinopril, and Sulfa antibiotics   History: Past Medical History:  Diagnosis Date   Atrial fibrillation (HCC) 03/26/2011   Basal cell carcinoma of other specified sites of skin 4/10/012   Basal cell carcinoma of skin, site unspecified 03/25/2012   Benign neoplasm of colon 03/25/2012   adenomatous polyps   Broken arm 11/25/2017   Colon polyp 2014   Degeneration of lumbar or lumbosacral intervertebral disc 09/24/2011   Disturbance of skin sensation 01/28/2012   Diverticulosis 2014  Dysphagia, unspecified(787.20) 03/26/2011   Elevated prostate specific antigen (PSA) 03/26/2011   Exposure to unspecified radiation 03/26/2011   Family history of colon cancer 07/16/2013   GERD (gastroesophageal reflux disease) 07/16/2013   Hearing deficit 12/21/2013   Hemangioma of unspecified site 03/26/2011   Hx of adenomatous colonic polyps 07/16/2013   Hypertrophy of prostate with urinary obstruction and other lower urinary tract symptoms (LUTS) 03/26/2011   Ingrowing nail 03/26/2011   Internal hemorrhoids without mention of complication 06/15/13    Keratosis, actinic 01/28/2012   Long term (current) use of anticoagulants 03/26/2011   Long term current use of anticoagulant therapy 07/23/2016   Lumbago 03/26/2011   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   Osteoarthrosis, unspecified whether generalized or localized, unspecified site 11/24/2012   Other and unspecified hyperlipidemia 03/26/2011   Other malaise and fatigue 03/26/2011   Pain in joint, forearm 11/24/2012   Pain in joint, lower leg 06/15/13   left knee   Paresthesia of left arm 07/05/2014   Rectal bleed 06/15/13   Reflux esophagitis 03/26/2011   Sacroiliitis, not elsewhere classified (HCC) 05/28/2011   Scoliosis (and kyphoscoliosis), idiopathic 05/28/2011   Squamous cell carcinoma of skin of upper limb, including shoulder 03/26/2011   Tinnitus 12/21/2013   Transient ischemic attack (TIA), and cerebral infarction without residual deficits(V12.54) 03/26/2011   Unspecified essential hypertension 03/26/2011   Past Surgical History:  Procedure Laterality Date   BASAL CELL CARCINOMA EXCISION Left 12/10/14   Dr. [name]   COLONOSCOPY  08/12/2013   Dr. Rhea Belton adenomatous polyps   CRYOTHERAPY Left 2009   hand   PHOTODYNAMIC THERAPY  09/26/14   face and ear lobs  Dr. 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    Employer: RETIRED  Tobacco Use   Smoking status: Never   Smokeless tobacco: Never  Vaping Use   Vaping status: Never Used  Substance and Sexual Activity   Alcohol use: No   Drug use: No   Sexual activity: Not on file  Other Topics  Concern   Not on file  Social History Narrative   [facility] since 11/[facility]   Married - [place]   Does not have Living Will   Never smoked   Alcohol none   Exercise: tennis, pool, table tennis 2-3 times a week   Several daughters   Social Drivers of Corporate investment banker Strain: Low Risk  (03/25/2022)   Overall Financial Resource Strain (CARDIA)    Difficulty of Paying Living Expenses: Not hard at all  Food Insecurity: No Food Insecurity (03/25/2022)   Hunger Vital Sign    Worried About Radiation protection practitioner of Food in the Last Year: Never true    Ran Out of Food in the Last Year: Never true  Transportation Needs: No Transportation Needs (03/25/2022)   PRAPARE - Transportation    Lack of Transportation (Medical): No    Lack  of Transportation (Non-Medical): No  Physical Activity: Inactive (03/25/2022)   Exercise Vital Sign    Days of Exercise per Week: 0 days    Minutes of Exercise per Session: 0 min  Stress: No Stress Concern Present (03/25/2022)   [employer] of Occupational Health - Occupational Stress Questionnaire    Feeling of Stress : Only a little  Social Connections: Moderately Isolated (03/25/2022)   Social Connection and Isolation Panel [NHANES]    Frequency of Communication with Friends and Family: More than three times a week    Frequency of Social Gatherings with Friends and Family: More than three times a week    Attends Religious Services: Never    Database administrator or Organizations: No    Attends Engineer, structural: Never    Marital Status: Married    Tobacco Counseling Counseling given: Not Answered   Clinical Intake:  Pre-visit preparation completed: Yes  Pain : No/denies pain     BMI - recorded: 31.29 Nutritional Status: BMI > 30  Obese Nutritional Risks: None Diabetes: No  How often do you need to have someone help you when you read instructions, pamphlets, or other written materials from your doctor or pharmacy?: 5  - Always (macular degeneration)  Interpreter Needed?: No  Information entered by :: Tiran Sauseda Nedra Hai NP   Activities of Daily Living    01/06/2024    1:31 PM  In your present state of health, do you have any difficulty performing the following activities:  Hearing? 1  Comment hearing aids R+L  Vision? 1  Comment with reading glasses  Difficulty concentrating or making decisions? 1  Walking or climbing stairs? 1  Dressing or bathing? 1  Doing errands, shopping? 1  Preparing Food and eating ? N  Using the Toilet? N  In the past six months, have you accidently leaked urine? N  Do you have problems with loss of bowel control? N  Managing your Medications? Y  Managing your Finances? Y  Housekeeping or managing your Housekeeping? Y    Patient Care Team: Kaydin Karbowski X, NP as PCP - General (Internal Medicine) [name], MD as Consulting Physician (Dermatology) [place], [facility] Frederica Kuster, MD as Attending Physician (Family Medicine)  Indicate any recent Medical Services you may have received from other than [facility] providers in the past year (date may be approximate).     Assessment:   This is a routine wellness examination for [place].  Hearing/Vision screen No results found.   Goals Addressed             This Visit's Progress    Maintain Mobility and Function       Evidence-based guidance:  Emphasize the importance of physical activity and aerobic exercise as included in treatment plan; assess barriers to adherence; consider patient's abilities and preferences.  Encourage gradual increase in activity or exercise instead of stopping if pain occurs.  Reinforce individual therapy exercise prescription, such as strengthening, stabilization and stretching programs.  Promote optimal body mechanics to stabilize the spine with lifting and functional activity.  Encourage activity and mobility modifications to facilitate optimal function, such as using a log roll for bed  mobility or dressing from a seated position.  Reinforce individual adaptive equipment recommendations to limit excessive spinal movements, such as a Event organiser.  Assess adequacy of sleep; encourage use of sleep hygiene techniques, such as bedtime routine; use of white noise; dark, cool bedroom; avoiding daytime naps, heavy meals or exercise before  bedtime.  Promote positions and modification to optimize sleep and sexual activity; consider pillows or positioning devices to assist in maintaining neutral spine.  Explore options for applying ergonomic principles at work and home, such as frequent position changes, using ergonomically designed equipment and working at optimal height.  Promote modifications to increase comfort with driving such as lumbar support, optimizing seat and steering wheel position, using cruise control and taking frequent rest stops to stretch and walk.   Notes:        Depression Screen    01/06/2024    1:40 PM 03/20/2023    4:18 PM 05/28/2022    2:48 PM 03/25/2022    3:22 PM 03/18/2018    2:47 PM 10/01/2017    9:54 AM 07/23/2016    9:36 AM  PHQ 2/9 Scores  PHQ - 2 Score 0 0 0 0 0 0 0  PHQ- 9 Score  0         Fall Risk    05/07/2023    2:50 PM 03/20/2023    4:18 PM 10/21/2022    9:10 AM 07/04/2022    1:24 PM 06/05/2022    9:31 AM  Fall Risk   Falls in the past year? 0 0 1 1 1   Number falls in past yr: 0 0 1 1 0  Injury with Fall? 0 0 1 0 0  Risk for fall due to :  No Fall Risks   History of fall(s)  Follow up Falls evaluation completed Falls evaluation completed Falls evaluation completed  Falls evaluation completed    MEDICARE RISK AT HOME: Medicare Risk at Home Any stairs in or around the home?: Yes If so, are there any without handrails?: No Home free of loose throw rugs in walkways, pet beds, electrical cords, etc?: No Adequate lighting in your home to reduce risk of falls?: Yes Life alert?: No Use of a cane, walker or w/c?: Yes Grab bars in the  bathroom?: Yes Shower chair or bench in shower?: Yes Elevated toilet seat or a handicapped toilet?: Yes  TIMED UP AND GO:  Was the test performed?  No    Cognitive Function:    07/04/2022    3:00 PM 03/25/2022    3:23 PM 01/28/2018   11:36 AM  MMSE - Mini Mental State Exam  Not completed:   --  Orientation to time 5 5 4   Orientation to Place 5 5 5   Registration 3 3 3   Attention/ Calculation 4 3 5   Recall 1 0 3  Language- name 2 objects 2 2 2   Language- repeat 1 1 1   Language- follow 3 step command 3 3 3   Language- read & follow direction 1 1 1   Write a sentence 1 1 1   Copy design 1 0 1  Total score 27 24 29         Immunizations Immunization History  Administered Date(s) Administered   Fluad Quad(high Dose 65+) 10/07/2022, 09/17/2023   Influenza Split 11/18/2007, 10/23/2009, 10/10/2010   Influenza Whole 09/15/2012, 09/16/2013   Influenza, High Dose Seasonal PF 09/24/2017, 09/20/2021   Influenza,inj,Quad PF,6+ Mos 09/17/2018   Influenza-Unspecified 09/29/2014, 09/14/2015, 09/26/2016, 09/05/2020   Moderna Covid-19 Vaccine Bivalent Booster 79yrs & up 09/05/2021, 10/17/2022   Moderna SARS-COV2 Booster Vaccination 12/31/2019, 05/23/2021, 10/01/2023   Moderna Sars-Covid-2 Vaccination 12/20/2019, 01/17/2020   PPD Test 10/10/2010, 11/27/2010   Pneumococcal Conjugate-13 02/08/2016   Pneumococcal Polysaccharide-23 01/17/1996   Pneumococcal-Unspecified 01/17/1996   Td 02/14/2006, 09/04/2016   Td (Adult), 2 Lf Tetanus  Toxid, Preservative Free 02/14/2006, 09/04/2016   Tdap 03/05/2006   Zoster Recombinant(Shingrix) 03/31/2018, 06/23/2018   Zoster, Live 03/05/2006    TDAP status: Up to date  Flu Vaccine status: Up to date  Pneumococcal vaccine status: Up to date  Covid-19 vaccine status: Completed vaccines  Qualifies for Shingles Vaccine? Yes   Zostavax completed Yes   Shingrix Completed?: Yes  Screening Tests Health Maintenance  Topic Date Due   COVID-19 Vaccine (5  - 2024-25 season) 11/26/2023   Medicare Annual Wellness (AWV)  01/05/2025   DTaP/Tdap/Td (5 - Td or Tdap) 09/04/2026   Pneumonia Vaccine 63+ Years old  Completed   INFLUENZA VACCINE  Completed   Zoster Vaccines- Shingrix  Completed   HPV VACCINES  Aged Out   Colonoscopy  Discontinued    Health Maintenance  Health Maintenance Due  Topic Date Due   COVID-19 Vaccine (5 - 2024-25 season) 11/26/2023    Colorectal cancer screening: No longer required.   Lung Cancer Screening: (Low Dose CT Chest recommended if Age 65-80 years, 20 pack-year currently smoking OR have quit w/in 15years.) does not qualify.   Lung Cancer Screening Referral: NA  Additional Screening:  Hepatitis C Screening: does not qualify; NA  Vision Screening: Recommended annual ophthalmology exams for early detection of glaucoma and other disorders of the eye. Is the patient up to date with their annual eye exam?  Yes  Who is the provider or what is the name of the office in which the patient attends annual eye exams? Dr. Burgess Estelle If pt is not established with a provider, would they like to be referred to a provider to establish care? No .   Dental Screening: Recommended annual dental exams for proper oral hygiene  Diabetic Foot Exam: NA  Community Resource Referral / Chronic Care Management: CRR required this visit?  No   CCM required this visit?  No     Plan:     I have personally reviewed and noted the following in the patient's chart:   Medical and social history Use of alcohol, tobacco or illicit drugs  Current medications and supplements including opioid prescriptions. Patient is not currently taking opioid prescriptions. Functional ability and status Nutritional status Physical activity Advanced directives List of other physicians Hospitalizations, surgeries, and ER visits in previous 12 months Vitals Screenings to include cognitive, depression, and falls Referrals and appointments  In  addition, I have reviewed and discussed with patient certain preventive protocols, quality metrics, and best practice recommendations. A written personalized care plan for preventive services as well as general preventive health recommendations were provided to patient.     Yolandra Habig X Jamilyn Pigeon, NP   01/06/2024   After Visit Summary: (In Person-Declined) Patient declined AVS at this time.

## 2024-02-05 ENCOUNTER — Non-Acute Institutional Stay: Payer: Self-pay | Admitting: Sports Medicine

## 2024-02-05 ENCOUNTER — Encounter: Payer: Self-pay | Admitting: Sports Medicine

## 2024-02-05 DIAGNOSIS — E782 Mixed hyperlipidemia: Secondary | ICD-10-CM

## 2024-02-05 DIAGNOSIS — K219 Gastro-esophageal reflux disease without esophagitis: Secondary | ICD-10-CM

## 2024-02-05 DIAGNOSIS — I480 Paroxysmal atrial fibrillation: Secondary | ICD-10-CM

## 2024-02-05 DIAGNOSIS — E039 Hypothyroidism, unspecified: Secondary | ICD-10-CM | POA: Diagnosis not present

## 2024-02-05 DIAGNOSIS — I10 Essential (primary) hypertension: Secondary | ICD-10-CM | POA: Diagnosis not present

## 2024-02-05 NOTE — Progress Notes (Signed)
Provider:  Dr. Venita [name] Location:  [facility] Place of Service:   Assisted living    PCP: Phillip [name], Phillip X, NP Patient Care Team: Phillip [name], Phillip X, NP as PCP - General (Internal Medicine) Phillip Starch, MD as Consulting Physician (Dermatology) Phillip [name], [facility] Phillip Kuster, MD as Attending Physician [facility])  Extended Emergency Contact Information Primary Emergency Contact: Phillip [name],Phillip [name] Address: [address] [place], [place] [address] [place] of Phillip [name] Phone: [phone] Relation: Daughter Secondary Emergency Contact: Phillip [name] States of [place] Mobile Phone: [phone] Relation: Daughter  Goals of Care: Advanced Directive information    09/16/2023   10:21 AM  Advanced Directives  Does Patient Have a Medical Advance Directive? Yes  Type of Estate agent of [place];Living will  Does patient want to make changes to medical advance directive? No - Patient declined  Copy of Healthcare Power of Attorney in Chart? Yes - validated most recent copy scanned in chart (See row information)         History of Present Illness          89 yr old M with h/o HTN, Hypothyroidism, BPH, Afib, OA, GERD, Depression is  evaluated for chronic disease management. Pt seen and examined in his room He is sitting in his chair and does not appear to be in distress Has no acute concerns He ambulates with a rolator walker and visits his wife in [place]   HTN  Bp denies feeling dizzy or lightheaded Currently on amlodipine, metoprolol, valsartan   Hypothyroidism  Lab Results  Component Value Date   TSH 3.92 07/29/2023   On synthyroid   Past Medical History:  Diagnosis Date   Atrial fibrillation (HCC) 03/26/2011   Basal cell carcinoma of other specified sites of skin 4/10/012   Basal cell carcinoma of skin, site unspecified 03/25/2012   Benign neoplasm of colon 03/25/2012   adenomatous polyps    Broken arm 11/25/2017   Colon polyp 2014   Degeneration of lumbar or lumbosacral intervertebral disc 09/24/2011   Disturbance of skin sensation 01/28/2012   Diverticulosis 2014   Dysphagia, unspecified(787.20) 03/26/2011   Elevated prostate specific antigen (PSA) 03/26/2011   Exposure to unspecified radiation 03/26/2011   Family history of colon cancer 07/16/2013   GERD (gastroesophageal reflux disease) 07/16/2013   Hearing deficit 12/21/2013   Hemangioma of unspecified site 03/26/2011   Hx of adenomatous colonic polyps 07/16/2013   Hypertrophy of prostate with urinary obstruction and other lower urinary tract symptoms (LUTS) 03/26/2011   Ingrowing nail 03/26/2011   Internal hemorrhoids without mention of complication 06/15/13   Keratosis, actinic 01/28/2012   Long term (current) use of anticoagulants 03/26/2011   Long term current use of anticoagulant therapy 07/23/2016   Lumbago 03/26/2011   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   Osteoarthrosis, unspecified whether generalized or localized, unspecified site 11/24/2012   Other and unspecified hyperlipidemia 03/26/2011   Other malaise and fatigue 03/26/2011   Pain in joint, forearm 11/24/2012   Pain in joint, lower leg 06/15/13   left knee   Paresthesia of left arm 07/05/2014   Rectal bleed 06/15/13   Reflux esophagitis 03/26/2011   Sacroiliitis, not elsewhere classified (HCC) 05/28/2011   Scoliosis (and kyphoscoliosis), idiopathic 05/28/2011   Squamous cell carcinoma of skin of upper limb, including shoulder 03/26/2011   Tinnitus 12/21/2013   Transient ischemic attack (TIA), and cerebral infarction without residual deficits(V12.54) 03/26/2011  Unspecified essential hypertension 03/26/2011   Past Surgical History:  Procedure Laterality Date   BASAL CELL CARCINOMA EXCISION Left 12/10/14   Dr. Donzetta [name]   COLONOSCOPY  08/12/2013   Dr. Rhea [name] adenomatous polyps   CRYOTHERAPY Left 2009   hand   PHOTODYNAMIC THERAPY  09/26/14   face and  ear lobs  Dr. Donzetta [name]   SQUAMOUS CELL CARCINOMA EXCISION Left 2010   hand    reports that he has never smoked. He has never used smokeless tobacco. He reports that he does not drink alcohol and does not use drugs. 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    Employer: RETIRED  Tobacco Use   Smoking status: Never   Smokeless tobacco: Never  Vaping Use   Vaping status: Never Used  Substance and Sexual Activity   Alcohol use: No   Drug use: No   Sexual activity: Not on file  Other Topics Concern   Not on file  Social History Narrative   Lives at [facility] since 11/2010   Married - Phillip [name]   Does not have Living Will   Never smoked   Alcohol none   Exercise: tennis, pool, table tennis 2-3 times a week   Several daughters   Social Drivers of Corporate investment banker Strain: Low Risk  (03/25/2022)   Overall Financial Resource Strain (CARDIA)    Difficulty of Paying Living Expenses: Not hard at all  Food Insecurity: No Food Insecurity (03/25/2022)   Hunger Vital Sign    Worried About Running Out of Food in the Last Year: Never true    Ran Out of Food in the Last Year: Never true  Transportation Needs: No Transportation Needs (03/25/2022)   PRAPARE - Administrator, Civil Service (Medical): No    Lack of Transportation (Non-Medical): No  Physical Activity: Inactive (03/25/2022)   Exercise Vital Sign    Days of Exercise per Week: 0 days    Minutes of Exercise per Session: 0 min  Stress: No Stress Concern Present (03/25/2022)   [employer] of Occupational Health - Occupational Stress Questionnaire    Feeling of Stress : Only a little  Social Connections: Moderately Isolated (03/25/2022)   Social Connection and  Isolation Panel [NHANES]    Frequency of Communication with Friends and Family: More than three times a week    Frequency of Social Gatherings with Friends and Family: More than three times a week    Attends Religious Services: Never    Database administrator or Organizations: No    Attends Banker Meetings: Never    Marital Status: Married  Catering manager Violence: Not At Risk (03/25/2022)   Humiliation, Afraid, Rape, and Kick questionnaire    Fear of Current or Ex-Partner: No    Emotionally Abused: No    Physically Abused: No    Sexually Abused: No    Functional Status Survey:    Family History  Problem Relation Age of Onset   Cancer Mother        colon   Heart disease Father        MI    Health Maintenance  Topic Date Due   COVID-19 Vaccine (  5 - 2024-25 season) 11/26/2023   Medicare Annual Wellness (AWV)  01/05/2025   DTaP/Tdap/Td (5 - Td or Tdap) 09/04/2026   Pneumonia Vaccine 70+ Years old  Completed   INFLUENZA VACCINE  Completed   Zoster Vaccines- Shingrix  Completed   HPV VACCINES  Aged Out   Colonoscopy  Discontinued    Allergies  Allergen Reactions   Ciprofloxacin Itching    Patient doesn't like the way it makes him feel   Lisinopril Itching    unknown   Sulfa Antibiotics Rash    Outpatient Encounter Medications as of 02/05/2024  Medication Sig   acetaminophen (TYLENOL) 500 MG tablet Take 500 mg by mouth daily.   amLODipine (NORVASC) 2.5 MG tablet TAKE 1 TABLET DAILY   cloNIDine (CATAPRES) 0.1 MG tablet Take 1 tablet (0.1 mg total) by mouth daily as needed. IF BP GREATER THAN 180/100   ELIQUIS 5 MG TABS tablet TAKE 1 TABLET TWICE A DAY FOR ANTICOAGULATION   finasteride (PROSCAR) 5 MG tablet TAKE 1 TABLET DAILY FOR URINATING DRIBBLING/PROSTATE ENLARGEMENT   levothyroxine (SYNTHROID) 25 MCG tablet TAKE 1 TABLET DAILY BEFORE BREAKFAST   metoprolol succinate (TOPROL-XL) 50 MG 24 hr tablet TAKE 1 TABLET TWICE A DAY   WITH OR IMMEDIATELY  FOLLOWING A MEAL TO CONTROL BLOOD PRESSURE AND HEART RHYTHM   pantoprazole (PROTONIX) 40 MG tablet Take 1 tablet (40 mg total) by mouth daily.   simvastatin (ZOCOR) 10 MG tablet TAKE 1 TABLET DAILY TO LOWER CHOLESTEROL   terazosin (HYTRIN) 2 MG capsule TAKE 1 CAPSULE DAILY   valsartan (DIOVAN) 320 MG tablet Take 1 tablet (320 mg total) by mouth daily.   No facility-administered encounter medications on file as of 02/05/2024.    Review of Systems  Constitutional:  Negative for fever.  HENT:  Negative for sinus pressure and sore throat.   Respiratory:  Negative for cough, shortness of breath and wheezing.   Cardiovascular:  Negative for chest pain, palpitations and leg swelling.  Gastrointestinal:  Negative for abdominal distention, abdominal pain, blood in stool, constipation, diarrhea, nausea and vomiting.  Genitourinary:  Negative for dysuria, frequency and urgency.  Neurological:  Negative for dizziness, weakness and numbness.   Negative unless indicated in HPI.  Vitals:   02/05/24 1227  BP: (!) 143/77  Pulse: 62  Resp: 18  Temp: 98.1 F (36.7 C)  SpO2: 97%  Weight: 199 lb 9.6 oz (90.5 kg)   Body mass index is 31.26 kg/m. BP Readings from Last 3 Encounters:  02/05/24 (!) 143/77  01/06/24 (!) 157/78  10/21/23 132/84   Wt Readings from Last 3 Encounters:  02/05/24 199 lb 9.6 oz (90.5 kg)  01/06/24 199 lb 12.8 oz (90.6 kg)  10/21/23 196 lb 9.6 oz (89.2 kg)   Physical Exam Constitutional:      Appearance: Normal appearance.  HENT:     Head: Normocephalic and atraumatic.  Cardiovascular:     Rate and Rhythm: Normal rate and regular rhythm.     Pulses: Normal pulses.     Heart sounds: Normal heart sounds.  Pulmonary:     Effort: No respiratory distress.     Breath sounds: No stridor. No wheezing or rales.  Abdominal:     General: Bowel sounds are normal. There is no distension.     Palpations: Abdomen is soft.     Tenderness: There is no abdominal tenderness. There  is no right CVA tenderness or guarding.  Musculoskeletal:        General: No  swelling.  Neurological:     Mental Status: He is alert. Mental status is at baseline.     Motor: No weakness.     Labs reviewed: Basic Metabolic Panel: Recent Labs    07/29/23 0000  NA 142  K 4.1  CL 106  CO2 28*  BUN 16  CREATININE 1.3  CALCIUM 9.1   Liver Function Tests: Recent Labs    07/29/23 0000  AST 16  ALT 10  ALKPHOS 81  ALBUMIN 3.7   No results for input(s): "LIPASE", "AMYLASE" in the last 8760 hours. No results for input(s): "AMMONIA" in the last 8760 hours. CBC: Recent Labs    07/29/23 0000  WBC 6.0  NEUTROABS 4,008.00  HGB 14.8  HCT 44  PLT 176   Cardiac Enzymes: No results for input(s): "CKTOTAL", "CKMB", "CKMBINDEX", "TROPONINI" in the last 8760 hours. BNP: Invalid input(s): "POCBNP" No results found for: "HGBA1C" Lab Results  Component Value Date   TSH 3.92 07/29/2023   Lab Results  Component Value Date   VITAMINB12 465 07/04/2022   No results found for: "FOLATE" No results found for: "IRON", "TIBC", "FERRITIN"  Imaging and Procedures obtained prior to SNF admission: EEG adult Result Date: 07/26/2022 Van Clines, MD     07/26/2022  1:16 PM ELECTROENCEPHALOGRAM REPORT Date of Study: 07/26/2022 Patient's Name: Phillip [name] MRN: [id number] Date of Birth: 05-31-1929 Referring Provider: Marlowe Kays, PA-C Clinical History: This is a 89 year old Phillip with hallucinations, episode of urinary incontinence. EEG for classification. Medications: Eliquis, Lexapro, Synthroid, Proscar, Toprol, Simvastatin, Hytrin, Diovan Technical Summary: A multichannel digital EEG recording measured by the international 10-20 system with electrodes applied with paste and impedances below 5000 ohms performed in our laboratory with EKG monitoring in a predominantly drowsy and asleep patient.  Hyperventilation was not performed. Photic stimulation was performed.  The digital EEG was referentially  recorded, reformatted, and digitally filtered in a variety of bipolar and referential montages for optimal display.  Description: The patient is predominantly drowsy and asleep during the recording.  During brief period of wakefulness, there is a symmetric, medium voltage 8 Hz posterior dominant rhythm that attenuates with eye opening.  The record is symmetric.  During drowsiness and sleep, there is an increase in theta and delta slowing of the background.  Vertex waves and symmetric sleep spindles were seen. Photic stimulation did not elicit any abnormalities.  There were no epileptiform discharges or electrographic seizures seen.  EKG lead was unremarkable. Impression: This predominantly drowsy and asleep EEG is normal.  Clinical Correlation: A normal EEG does not exclude a clinical diagnosis of epilepsy.  If further clinical questions remain, prolonged EEG may be helpful.  Clinical correlation is advised. Patrcia Dolly, M.D.    Assessment and Plan   1. Paroxysmal atrial fibrillation (HCC) (Primary) No signs of bleeding  Cont with metoprolol  2. Essential hypertension Bp at goal  Cont with amlodipine, valsartan, metoprolol  3. Hypothyroidism, unspecified type Will check TSH adjust dose accordingly Cont levothyroixine  4. Gastroesophageal reflux disease, unspecified whether esophagitis present Denies heart burn  Cont with protonix  5. Mixed hyperlipidemia Cont with simvastatin         30 min Total time spent for obtaining history,  performing a medically appropriate examination and evaluation, reviewing the tests,   documenting clinical information in the electronic or other health record,  ,care coordination (not separately reported)

## 2024-02-12 LAB — BASIC METABOLIC PANEL WITH GFR
BUN: 16 (ref 4–21)
CO2: 22 (ref 13–22)
Chloride: 109 — AB (ref 99–108)
Creatinine: 1.1 (ref 0.6–1.3)
Glucose: 79
Potassium: 4.1 meq/L (ref 3.5–5.1)
Sodium: 142 (ref 137–147)

## 2024-02-12 LAB — HEPATIC FUNCTION PANEL
ALT: 9 U/L — AB (ref 10–40)
AST: 15 (ref 14–40)
Alkaline Phosphatase: 78 (ref 25–125)
Bilirubin, Total: 1

## 2024-02-12 LAB — TSH: TSH: 3.05 (ref 0.41–5.90)

## 2024-02-12 LAB — CBC AND DIFFERENTIAL
HCT: 42 (ref 41–53)
Hemoglobin: 14.2 (ref 13.5–17.5)
Platelets: 159 K/uL (ref 150–400)
WBC: 6.1

## 2024-02-12 LAB — COMPREHENSIVE METABOLIC PANEL WITH GFR
Albumin: 3.5 (ref 3.5–5.0)
Calcium: 8.8 (ref 8.7–10.7)
Globulin: 2.3
eGFR: 63

## 2024-02-12 LAB — VITAMIN B12: Vitamin B-12: 321

## 2024-02-12 LAB — CBC: RBC: 4.32 (ref 3.87–5.11)

## 2024-02-16 ENCOUNTER — Other Ambulatory Visit: Payer: Self-pay | Admitting: Nurse Practitioner

## 2024-02-16 NOTE — Telephone Encounter (Signed)
 Patient requesting medication that's not on list. Medication pend and sent to PCP [name], [name], NP

## 2024-02-23 ENCOUNTER — Other Ambulatory Visit: Payer: Self-pay | Admitting: Nurse Practitioner

## 2024-02-23 NOTE — Telephone Encounter (Signed)
 Medication has allergy contraindications. Medication pend and sent to PCP [name], [name], NP

## 2024-03-04 ENCOUNTER — Other Ambulatory Visit: Payer: Self-pay | Admitting: Nurse Practitioner

## 2024-03-16 ENCOUNTER — Encounter: Payer: Self-pay | Admitting: Adult Health

## 2024-03-16 ENCOUNTER — Non-Acute Institutional Stay: Payer: Self-pay | Admitting: Adult Health

## 2024-03-16 DIAGNOSIS — I480 Paroxysmal atrial fibrillation: Secondary | ICD-10-CM | POA: Diagnosis not present

## 2024-03-16 DIAGNOSIS — N401 Enlarged prostate with lower urinary tract symptoms: Secondary | ICD-10-CM

## 2024-03-16 DIAGNOSIS — I10 Essential (primary) hypertension: Secondary | ICD-10-CM

## 2024-03-16 DIAGNOSIS — E039 Hypothyroidism, unspecified: Secondary | ICD-10-CM | POA: Diagnosis not present

## 2024-03-16 DIAGNOSIS — H6123 Impacted cerumen, bilateral: Secondary | ICD-10-CM

## 2024-03-16 DIAGNOSIS — N138 Other obstructive and reflux uropathy: Secondary | ICD-10-CM

## 2024-03-16 NOTE — Progress Notes (Signed)
 Location:  [facility] Room Number: 914 A Place of Service:  ALF (13) Provider:  Kenard Gower, DNP, FNP-BC  Patient Care Team: [name], [name], NP as PCP - General (Internal Medicine) [name], MD as Consulting Physician (Dermatology) [place] Frederica Kuster, MD as Attending Physician [facility])  Extended Emergency Contact Information Primary Emergency Contact: [name],[name] Address: [address] [place], [place] [address] [place] Phone: [phone] Relation: Daughter Secondary Emergency Contact: [name] of [place] Mobile Phone: [phone] Relation: Daughter  Code Status:   Full Code  Goals of care: Advanced Directive information    09/16/2023   10:21 AM  Advanced Directives  Does Patient Have a Medical Advance Directive? Yes  Type of Estate agent of [place];Living will  Does patient want to make changes to medical advance directive? No - Patient declined  Copy of Healthcare Power of Attorney in Chart? Yes - validated most recent copy scanned in chart (See row information)     Chief Complaint  Patient presents with   Acute Visit    cerumen build up    HPI:  Pt is a 89 y.o. male seen today for an acute visit regarding cerumen build up. He was accompanied by his daughter today.  He has excessive cerumen buildup in both ears, which was noted during a recent hearing aid appointment at [facility], preventing a hearing assessment. His ears were cleaned two weeks ago, then had another one  last week and he underwent ear lavage this morning before the appointment. No ear pain is reported.  He has a history of hypertension, with a recent blood pressure reading of 137/82 mmHg. He is currently taking clonidine 0.1 mg daily as needed, amlodipine 2.5 mg daily, and valsartan 320 mg daily.  He has atrial fibrillation and is on Eliquis 5 mg twice a day  for anticoagulation and metoprolol succinate ER 50 mg twice a day for rate control. No palpitations are reported.  He has hypothyroidism and is taking levothyroxine 25 mg daily. His latest TSH level was 3.05, measured on February 17, 2024.  No urinary issues are reported. He is currently on finasteride 5 mg daily and terazosin 2 mg daily for benign prostatic hyperplasia (BPH).  Past Medical History:  Diagnosis Date   Atrial fibrillation (HCC) 03/26/2011   Basal cell carcinoma of other specified sites of skin 4/10/012   Basal cell carcinoma of skin, site unspecified 03/25/2012   Benign neoplasm of colon 03/25/2012   adenomatous polyps   Broken arm 11/25/2017   Colon polyp 2014   Degeneration of lumbar or lumbosacral intervertebral disc 09/24/2011   Disturbance of skin sensation 01/28/2012   Diverticulosis 2014   Dysphagia, unspecified(787.20) 03/26/2011   Elevated prostate specific antigen (PSA) 03/26/2011   Exposure to unspecified radiation 03/26/2011   Family history of colon cancer 07/16/2013   GERD (gastroesophageal reflux disease) 07/16/2013   Hearing deficit 12/21/2013   Hemangioma of unspecified site 03/26/2011   Hx of adenomatous colonic polyps 07/16/2013   Hypertrophy of prostate with urinary obstruction and other lower urinary tract symptoms (LUTS) 03/26/2011   Ingrowing nail 03/26/2011   Internal hemorrhoids without mention of complication 06/15/13   Keratosis, actinic 01/28/2012   Long term (current) use of anticoagulants 03/26/2011   Long term current use of anticoagulant therapy 07/23/2016   Lumbago 03/26/2011   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   Osteoarthrosis, unspecified whether generalized or localized, unspecified site 11/24/2012   Other and unspecified hyperlipidemia 03/26/2011   Other malaise and fatigue 03/26/2011   Pain in joint, forearm 11/24/2012   Pain in joint, lower leg 06/15/13   left knee   Paresthesia of left arm 07/05/2014   Rectal bleed 06/15/13    Reflux esophagitis 03/26/2011   Sacroiliitis, not elsewhere classified (HCC) 05/28/2011   Scoliosis (and kyphoscoliosis), idiopathic 05/28/2011   Squamous cell carcinoma of skin of upper limb, including shoulder 03/26/2011   Tinnitus 12/21/2013   Transient ischemic attack (TIA), and cerebral infarction without residual deficits(V12.54) 03/26/2011   Unspecified essential hypertension 03/26/2011   Past Surgical History:  Procedure Laterality Date   BASAL CELL CARCINOMA EXCISION Left 12/10/14   Dr. [name]   COLONOSCOPY  08/12/2013   Dr. Rhea Belton adenomatous polyps   CRYOTHERAPY Left 2009   hand   PHOTODYNAMIC THERAPY  09/26/14   face and ear lobs  Dr. [name]   SQUAMOUS CELL CARCINOMA EXCISION Left 2010   hand    Allergies  Allergen Reactions   Ciprofloxacin Itching    Patient doesn't like the way it makes him feel   Lisinopril Itching    unknown   Sulfa Antibiotics Rash    Outpatient Encounter Medications as of 03/16/2024  Medication Sig   acetaminophen (TYLENOL) 500 MG tablet Take 500 mg by mouth daily.   amLODipine (NORVASC) 2.5 MG tablet TAKE 1 TABLET DAILY   cloNIDine (CATAPRES) 0.1 MG tablet TAKE 1 TABLET DAILY AS NEEDED, IF BLOOD PRESSURE GREATER THAN 180/100   ELIQUIS 5 MG TABS tablet TAKE 1 TABLET TWICE A DAY FOR ANTICOAGULATION   famotidine (PEPCID) 20 MG tablet TAKE 1 TABLET DAILY AS NEEDED FOR HEARTBURN OR INDIGESTION   finasteride (PROSCAR) 5 MG tablet TAKE 1 TABLET DAILY FOR URINATING DRIBBLING/PROSTATE ENLARGEMENT   levothyroxine (SYNTHROID) 25 MCG tablet TAKE 1 TABLET DAILY BEFORE BREAKFAST   metoprolol succinate (TOPROL-XL) 50 MG 24 hr tablet TAKE 1 TABLET TWICE A DAY   WITH OR IMMEDIATELY FOLLOWING A MEAL TO CONTROL BLOOD PRESSURE AND HEART RHYTHM   pantoprazole (PROTONIX) 40 MG tablet Take 1 tablet (40 mg total) by mouth daily.   simvastatin (ZOCOR) 10 MG tablet TAKE 1 TABLET DAILY TO LOWER CHOLESTEROL   terazosin (HYTRIN) 2 MG capsule TAKE 1 CAPSULE DAILY    valsartan (DIOVAN) 320 MG tablet Take 1 tablet (320 mg total) by mouth daily.   No facility-administered encounter medications on file as of 03/16/2024.    Review of Systems  Constitutional:  Negative for activity change, appetite change and fever.  HENT:  Negative for sore throat.   Eyes: Negative.   Cardiovascular:  Negative for chest pain and leg swelling.  Gastrointestinal:  Negative for abdominal distention, diarrhea and vomiting.  Genitourinary:  Negative for dysuria, frequency and urgency.  Skin:  Negative for color change.  Neurological:  Negative for dizziness and headaches.  Psychiatric/Behavioral:  Negative for behavioral problems and sleep disturbance. The patient is not nervous/anxious.       Immunization History  Administered Date(s) Administered   Fluad Quad(high Dose 65+) 10/07/2022, 09/17/2023   Influenza Split 11/18/2007, 10/23/2009, 10/10/2010   Influenza Whole 09/15/2012, 09/16/2013   Influenza, High Dose Seasonal PF 09/24/2017, 09/20/2021   Influenza,inj,Quad PF,6+ Mos 09/17/2018   Influenza-Unspecified 09/29/2014, 09/14/2015, 09/26/2016, 09/05/2020   Moderna Covid-19 Vaccine Bivalent Booster 33yrs & up 09/05/2021, 10/17/2022   Moderna SARS-COV2 Booster Vaccination 12/31/2019, 05/23/2021, 10/01/2023  Moderna Sars-Covid-2 Vaccination 12/20/2019, 01/17/2020   PPD Test 10/10/2010, 11/27/2010   Pneumococcal Conjugate-13 02/08/2016   Pneumococcal Polysaccharide-23 01/17/1996   Pneumococcal-Unspecified 01/17/1996   Td 02/14/2006, 09/04/2016   Td (Adult), 2 Lf Tetanus Toxid, Preservative Free 02/14/2006, 09/04/2016   Tdap 03/05/2006   Zoster Recombinant(Shingrix) 03/31/2018, 06/23/2018   Zoster, Live 03/05/2006   Pertinent  Health Maintenance Due  Topic Date Due   INFLUENZA VACCINE  07/16/2024   Colonoscopy  Discontinued      06/22/2022    3:41 PM 07/04/2022    1:24 PM 10/21/2022    9:10 AM 03/20/2023    4:18 PM 05/07/2023    2:50 PM  Fall Risk  Falls in the  past year?  1 1 0 0  Was there an injury with Fall?  0 1 0 0  Fall Risk Category Calculator  2 3 0 0  Fall Risk Category (Retired)  Moderate High    (RETIRED) Patient Fall Risk Level Moderate fall risk High fall risk High fall risk    Patient at Risk for Falls Due to    No Fall Risks   Fall risk Follow up   Falls evaluation completed Falls evaluation completed Falls evaluation completed     Vitals:   03/16/24 1541  BP: 137/82  Pulse: 68  Resp: 19  Temp: 97.6 F (36.4 C)  SpO2: 97%  Weight: 200 lb 6.4 oz (90.9 kg)  Height: 5\' 8"  (1.727 m)   Body mass index is 30.47 kg/m.  Physical Exam Constitutional:      General: He is not in acute distress.    Appearance: He is obese.  HENT:     Head: Normocephalic and atraumatic.     Right Ear: There is impacted cerumen.     Left Ear: There is impacted cerumen.     Mouth/Throat:     Mouth: Mucous membranes are moist.  Eyes:     Conjunctiva/sclera: Conjunctivae normal.  Cardiovascular:     Rate and Rhythm: Normal rate and regular rhythm.     Pulses: Normal pulses.     Heart sounds: Normal heart sounds.  Pulmonary:     Effort: Pulmonary effort is normal.     Breath sounds: Normal breath sounds.  Abdominal:     General: Bowel sounds are normal.     Palpations: Abdomen is soft.  Musculoskeletal:        General: No swelling. Normal range of motion.     Cervical back: Normal range of motion.  Skin:    General: Skin is warm and dry.  Neurological:     Mental Status: He is alert.  Psychiatric:        Mood and Affect: Mood normal.        Behavior: Behavior normal.      Labs reviewed: Recent Labs    07/29/23 0000  NA 142  K 4.1  CL 106  CO2 28*  BUN 16  CREATININE 1.3  CALCIUM 9.1   Recent Labs    07/29/23 0000  AST 16  ALT 10  ALKPHOS 81  ALBUMIN 3.7   Recent Labs    07/29/23 0000  WBC 6.0  NEUTROABS 4,008.00  HGB 14.8  HCT 44  PLT 176   Lab Results  Component Value Date   TSH 3.92 07/29/2023   No  results found for: "HGBA1C" Lab Results  Component Value Date   CHOL 111 07/29/2023   HDL 37 07/29/2023   LDLCALC 55 07/29/2023   TRIG  108 07/29/2023   CHOLHDL 2.5 10/15/2022    Significant Diagnostic Results in last 30 days:  No results found.  Assessment/Plan  1. Bilateral impacted cerumen (Primary) -  Persistent excessive cerumen buildup in both ears, preventing hearing assessment at a recent hearing aid appointment. Denies ear pain. - Restart Debrox otic drops, instill five drops to both ears twice a day for four days - Irrigate bilateral ears on the fifth day - Refer to ENT for further evaluation and management  2. Paroxysmal atrial fibrillation (HCC) -  Atrial fibrillation managed with Eliquis for anticoagulation and metoprolol succinate ER for rate control. Denies palpitations.  3. Essential hypertension -  Hypertension managed with clonidine, amlodipine, and valsartan. Blood pressure reading is 137/82 mmHg, indicating controlled blood pressure.  4. Hypothyroidism, unspecified type -  Hypothyroidism managed with levothyroxine. TSH level is 3.05, indicating stable thyroid function.  5. BPH with obstruction/lower urinary tract symptoms -  Denies urinary issues. Managed with finasteride and terazosin.    Family/ staff Communication: Discussed plan of care with resident, daughter and charge nurse.  Labs/tests ordered: None    Kenard Gower, DNP, MSN, FNP-BC [facility] [phone] (Monday-Friday 8:00 a.m. - 5:00 p.m.) [phone] (after hours)

## 2024-04-02 ENCOUNTER — Ambulatory Visit (INDEPENDENT_AMBULATORY_CARE_PROVIDER_SITE_OTHER): Admitting: Physician Assistant

## 2024-04-02 VITALS — BP 145/90 | HR 80 | Ht 67.0 in | Wt 202.0 lb

## 2024-04-02 DIAGNOSIS — H6123 Impacted cerumen, bilateral: Secondary | ICD-10-CM

## 2024-04-02 DIAGNOSIS — H9193 Unspecified hearing loss, bilateral: Secondary | ICD-10-CM

## 2024-04-02 NOTE — Progress Notes (Unsigned)
 Dear Dr. Jammie Mccune, Here is my assessment for our mutual patient, Phillip [name]. Thank you for allowing me the opportunity to care for your patient. Please do not hesitate to contact me should you have any other questions. Sincerely, Belma Boxer PA-C  Otolaryngology Clinic Note Referring provider: Dr. Jammie Mccune HPI:  Phillip [name] is a 89 y.o. male kindly referred by Dr. Jammie Mccune   This is a 89 year old gentleman seen in our office for evaluation of hearing loss.  He is accompanied by his daughter today.  They note that he has a longstanding history of bilateral hearing loss.  He originally started using hearing aids 3 years ago.  He notes that his right side may be worse than the left.  He went to [facility] to attempt to have a repeat hearing evaluation and adjustment of his hearing aids but he had bilateral cerumen.  They attempted numerous times with irrigation and using Debrox but were unable to remove the cerumen impaction.  He denies any significant pain in the ears, no drainage, no recurrent ear infection, no head or neck trauma, no dizziness.  They note that they clean the ears approximately quarterly at the nursing home.    Independent Review of Additional Tests or Records:  none   PMH/Meds/All/SocHx/FamHx/ROS:   Past Medical History:  Diagnosis Date   Atrial fibrillation (HCC) 03/26/2011   Basal cell carcinoma of other specified sites of skin 4/10/012   Basal cell carcinoma of skin, site unspecified 03/25/2012   Benign neoplasm of colon 03/25/2012   adenomatous polyps   Broken arm 11/25/2017   Colon polyp 2014   Degeneration of lumbar or lumbosacral intervertebral disc 09/24/2011   Disturbance of skin sensation 01/28/2012   Diverticulosis 2014   Dysphagia, unspecified(787.20) 03/26/2011   Elevated prostate specific antigen (PSA) 03/26/2011   Exposure to unspecified radiation 03/26/2011   Family history of colon cancer 07/16/2013   GERD (gastroesophageal reflux disease) 07/16/2013   Hearing deficit  12/21/2013   Hemangioma of unspecified site 03/26/2011   Hx of adenomatous colonic polyps 07/16/2013   Hypertrophy of prostate with urinary obstruction and other lower urinary tract symptoms (LUTS) 03/26/2011   Ingrowing nail 03/26/2011   Internal hemorrhoids without mention of complication 06/15/13   Keratosis, actinic 01/28/2012   Long term (current) use of anticoagulants 03/26/2011   Long term current use of anticoagulant therapy 07/23/2016   Lumbago 03/26/2011   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   Osteoarthrosis, unspecified whether generalized or localized, unspecified site 11/24/2012   Other and unspecified hyperlipidemia 03/26/2011   Other malaise and fatigue 03/26/2011   Pain in joint, forearm 11/24/2012   Pain in joint, lower leg 06/15/13   left knee   Paresthesia of left arm 07/05/2014   Rectal bleed 06/15/13   Reflux esophagitis 03/26/2011   Sacroiliitis, not elsewhere classified (HCC) 05/28/2011   Scoliosis (and kyphoscoliosis), idiopathic 05/28/2011   Squamous cell carcinoma of skin of upper limb, including shoulder 03/26/2011   Tinnitus 12/21/2013   Transient ischemic attack (TIA), and cerebral infarction without residual deficits(V12.54) 03/26/2011   Unspecified essential hypertension 03/26/2011     Past Surgical History:  Procedure Laterality Date   BASAL CELL CARCINOMA EXCISION Left 12/10/14   Dr. Harlen Lick   COLONOSCOPY  08/12/2013   Dr. Bridgett Camps adenomatous polyps   CRYOTHERAPY Left 2009   hand   PHOTODYNAMIC THERAPY  09/26/14   face and ear lobs  Dr. Harlen Lick   SQUAMOUS CELL CARCINOMA EXCISION Left 2010  hand    Family History  Problem Relation Age of Onset   Cancer Mother        colon   Heart disease Father        MI     Social Connections: Moderately Isolated (03/25/2022)   Social Connection and Isolation Panel [NHANES]    Frequency of Communication with Friends and Family: More than three times a week    Frequency of Social Gatherings with  Friends and Family: More than three times a week    Attends Religious Services: Never    Database administrator or Organizations: No    Attends Banker Meetings: Never    Marital Status: Married      Current Outpatient Medications:    acetaminophen  (TYLENOL ) 500 MG tablet, Take 500 mg by mouth daily., Disp: , Rfl:    amLODipine  (NORVASC ) 2.5 MG tablet, TAKE 1 TABLET DAILY, Disp: 90 tablet, Rfl: 3   cloNIDine  (CATAPRES ) 0.1 MG tablet, TAKE 1 TABLET DAILY AS NEEDED, IF BLOOD PRESSURE GREATER THAN 180/100, Disp: 90 tablet, Rfl: 3   ELIQUIS  5 MG TABS tablet, TAKE 1 TABLET TWICE A DAY FOR ANTICOAGULATION, Disp: 180 tablet, Rfl: 3   famotidine  (PEPCID ) 20 MG tablet, TAKE 1 TABLET DAILY AS NEEDED FOR HEARTBURN OR INDIGESTION, Disp: 60 tablet, Rfl: 5   finasteride  (PROSCAR ) 5 MG tablet, TAKE 1 TABLET DAILY FOR URINATING DRIBBLING/PROSTATE ENLARGEMENT, Disp: 90 tablet, Rfl: 3   levothyroxine  (SYNTHROID ) 25 MCG tablet, TAKE 1 TABLET DAILY BEFORE BREAKFAST, Disp: 90 tablet, Rfl: 3   metoprolol  succinate (TOPROL -XL) 50 MG 24 hr tablet, TAKE 1 TABLET TWICE A DAY   WITH OR IMMEDIATELY FOLLOWING A MEAL TO CONTROL BLOOD PRESSURE AND HEART RHYTHM, Disp: 180 tablet, Rfl: 3   pantoprazole  (PROTONIX ) 40 MG tablet, Take 1 tablet (40 mg total) by mouth daily., Disp: 90 tablet, Rfl: 2   simvastatin  (ZOCOR ) 10 MG tablet, TAKE 1 TABLET DAILY TO LOWER CHOLESTEROL, Disp: 90 tablet, Rfl: 1   terazosin  (HYTRIN ) 2 MG capsule, TAKE 1 CAPSULE DAILY, Disp: 90 capsule, Rfl: 3   valsartan  (DIOVAN ) 320 MG tablet, Take 1 tablet (320 mg total) by mouth daily., Disp: 90 tablet, Rfl: 3   Physical Exam:   BP (!) 145/90   Pulse 80   Ht 5\' 7"  (1.702 m)   Wt 202 lb (91.6 kg)   SpO2 96%   BMI 31.64 kg/m   Pertinent Findings  CN II-XII intact Bilateral cerumen impaction Weber 512: equal Rinne 512: AC > BC b/l  Anterior rhinoscopy: Septum midline; bilateral inferior turbinates with no hypertrophy No lesions of  oral cavity/oropharynx; dentition wnl No obviously palpable neck masses/lymphadenopathy/thyromegaly No respiratory distress or stridor  Seprately Identifiable Procedures:  Procedure: Bilateral ear microscopy and cerumen removal using microscope (CPT [phone]) - Mod 50 Pre-procedure diagnosis: bilateral cerumen impaction external auditory canals Post-procedure diagnosis: same Indication: bilateral cerumen impaction; given patient's otologic complaints and history as well as for improved and comprehensive examination of external ear and tympanic membrane, bilateral otologic examination using microscope was performed and impacted cerumen removed  Procedure: Patient was placed semi-recumbent. Both ear canals were examined using the microscope with findings above. Cerumen removed from bilateral external auditory canals using suction and currette with improvement in EAC examination and patency. Left: EAC was patent. TM was intact . Middle ear was aerated. Drainage: none Right: EAC was patent. TM was intact . Middle ear was aerated . Drainage: none Patient tolerated the procedure well.  Impression & Plans:  Phillip [name] is a 89 y.o. male with the following   Bilateral cerumen impaction-  This was removed without difficulty, I would recommend the patient follow-up in 3 to 6 months for repeat evaluation given the reported rapid accumulation of cerumen.  Bilateral hearing loss-  Now that the cerumen has been removed the patient will be able to follow-up with his audiological provider for testing and hearing aids.  I am happy to see him back for hearing loss at any point.   - f/u 3-6 months   Thank you for allowing me the opportunity to care for your patient. Please do not hesitate to contact me should you have any other questions.  Sincerely, Belma Boxer PA-C [facility] Phone: [phone] Fax: [phone]  04/02/2024, 10:03 AM

## 2024-04-09 ENCOUNTER — Other Ambulatory Visit: Payer: Self-pay | Admitting: Family Medicine

## 2024-04-15 ENCOUNTER — Other Ambulatory Visit: Payer: Self-pay | Admitting: Nurse Practitioner

## 2024-04-16 NOTE — Telephone Encounter (Signed)
 High risk warning

## 2024-05-13 ENCOUNTER — Telehealth (INDEPENDENT_AMBULATORY_CARE_PROVIDER_SITE_OTHER): Payer: Self-pay | Admitting: Physician Assistant

## 2024-05-13 NOTE — Telephone Encounter (Signed)
 Left vm to get patient patient scheduled for next available with Dee Farber PA for cerumen.

## 2024-05-14 ENCOUNTER — Non-Acute Institutional Stay: Payer: Self-pay | Admitting: Nurse Practitioner

## 2024-05-14 ENCOUNTER — Encounter: Payer: Self-pay | Admitting: Nurse Practitioner

## 2024-05-14 DIAGNOSIS — N401 Enlarged prostate with lower urinary tract symptoms: Secondary | ICD-10-CM

## 2024-05-14 DIAGNOSIS — E782 Mixed hyperlipidemia: Secondary | ICD-10-CM

## 2024-05-14 DIAGNOSIS — N138 Other obstructive and reflux uropathy: Secondary | ICD-10-CM

## 2024-05-14 DIAGNOSIS — R269 Unspecified abnormalities of gait and mobility: Secondary | ICD-10-CM

## 2024-05-14 DIAGNOSIS — H3530 Unspecified macular degeneration: Secondary | ICD-10-CM

## 2024-05-14 DIAGNOSIS — M19011 Primary osteoarthritis, right shoulder: Secondary | ICD-10-CM

## 2024-05-14 DIAGNOSIS — K219 Gastro-esophageal reflux disease without esophagitis: Secondary | ICD-10-CM

## 2024-05-14 DIAGNOSIS — I480 Paroxysmal atrial fibrillation: Secondary | ICD-10-CM

## 2024-05-14 DIAGNOSIS — I10 Essential (primary) hypertension: Secondary | ICD-10-CM | POA: Diagnosis not present

## 2024-05-14 DIAGNOSIS — E039 Hypothyroidism, unspecified: Secondary | ICD-10-CM | POA: Diagnosis not present

## 2024-05-14 DIAGNOSIS — R419 Unspecified symptoms and signs involving cognitive functions and awareness: Secondary | ICD-10-CM

## 2024-05-14 DIAGNOSIS — F32A Depression, unspecified: Secondary | ICD-10-CM

## 2024-05-14 NOTE — Assessment & Plan Note (Signed)
on Simvastatin, LDL 55 07/29/23

## 2024-05-14 NOTE — Assessment & Plan Note (Signed)
 urinary frequency, on Terazosin , Proscar , saw Urology

## 2024-05-14 NOTE — Assessment & Plan Note (Signed)
on Levothyroxine, TSH 3.92 07/29/23

## 2024-05-14 NOTE — Assessment & Plan Note (Signed)
R shoulder, X-ray showed DJD, on Tylenol.

## 2024-05-14 NOTE — Progress Notes (Signed)
 Location:   [facility] Room Number: 914 Place of Service:  ALF (13) Provider: Abner Hoffman Ameliah Baskins NP  Marvel Sapp X, NP  Patient Care Team: Anara Cowman X, NP as PCP - General (Internal Medicine) Harlen Lick, MD as Consulting Physician (Dermatology) [place] Doreen Gamma, MD as Attending Physician [facility] Medicine)  Extended Emergency Contact Information Primary Emergency Contact: [name],[name] Address: [address] [place], [place] [address] [place] Phone: [phone] Relation: Daughter Secondary Emergency Contact: Thomsen,Carol Mobile Phone: [phone] Relation: Daughter  Code Status:  DNR Goals of care: Advanced Directive information    09/16/2023   10:21 AM  Advanced Directives  Does Patient Have a Medical Advance Directive? Yes  Type of Estate agent of [place];Living will  Does patient want to make changes to medical advance directive? No - Patient declined  Copy of Healthcare Power of Attorney in Chart? Yes - validated most recent copy scanned in chart (See row information)     Chief Complaint  Patient presents with   Medical Management of Chronic Issues    HPI:  Pt is a 89 y.o. male seen today for medical management of chronic diseases.     HTN, on Metoprolol , Amlodipine ,  Valsartan , prn Clonidine ,  Bun/creat 18/1.08 10/03/23             HLD on Simvastatin , LDL 55 07/29/23             Hypothyroidism, on Levothyroxine , TSH 3.92 07/29/23             BPH, urinary frequency, on Terazosin , Proscar , saw Urology              Afib, heart rate is in control, taking Eliquis , Metoprolol . F/u Cardiology.              OA R shoulder, X-ray showed DJD, on Tylenol .              MD, followed by Ophthalmology.              GERD stable, on Pantoprazole . Hgb 14.8 10/03/23             Depression, GDR Lexapro -off, stable.              Visual hallucination, resolved,  CT head negative, underwent neurology  evaluation             Gait abnormality, uses walker out of his room,  risk of falling.              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 MMSE 29/30 03/21/23, CT head microvascular ischemic disease and cerebral atrophy. Vit B12 465 07/04/22. Saw neurology, 03/26/23 MOST full scope of tx, IVF/ABT indicated.     Past Medical History:  Diagnosis Date   Atrial fibrillation (HCC) 03/26/2011   Basal cell carcinoma of other specified sites of skin 4/10/012   Basal cell carcinoma of skin, site unspecified 03/25/2012   Benign neoplasm of colon 03/25/2012   adenomatous polyps   Broken arm 11/25/2017   Colon polyp 2014   Degeneration of lumbar or lumbosacral intervertebral disc 09/24/2011   Disturbance of skin sensation 01/28/2012   Diverticulosis 2014   Dysphagia, unspecified(787.20) 03/26/2011   Elevated prostate specific antigen (PSA) 03/26/2011   Exposure to unspecified radiation 03/26/2011   Family history of colon cancer 07/16/2013   GERD (gastroesophageal reflux disease) 07/16/2013   Hearing deficit 12/21/2013  Hemangioma of unspecified site 03/26/2011   Hx of adenomatous colonic polyps 07/16/2013   Hypertrophy of prostate with urinary obstruction and other lower urinary tract symptoms (LUTS) 03/26/2011   Ingrowing nail 03/26/2011   Internal hemorrhoids without mention of complication 06/15/13   Keratosis, actinic 01/28/2012   Long term (current) use of anticoagulants 03/26/2011   Long term current use of anticoagulant therapy 07/23/2016   Lumbago 03/26/2011   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   Osteoarthrosis, unspecified whether generalized or localized, unspecified site 11/24/2012   Other and unspecified hyperlipidemia 03/26/2011   Other malaise and fatigue 03/26/2011   Pain in joint, forearm 11/24/2012   Pain in joint, lower leg 06/15/13   left knee   Paresthesia of left arm 07/05/2014   Rectal bleed 06/15/13   Reflux esophagitis 03/26/2011   Sacroiliitis, not  elsewhere classified (HCC) 05/28/2011   Scoliosis (and kyphoscoliosis), idiopathic 05/28/2011   Squamous cell carcinoma of skin of upper limb, including shoulder 03/26/2011   Tinnitus 12/21/2013   Transient ischemic attack (TIA), and cerebral infarction without residual deficits(V12.54) 03/26/2011   Unspecified essential hypertension 03/26/2011   Past Surgical History:  Procedure Laterality Date   BASAL CELL CARCINOMA EXCISION Left 12/10/14   Dr. Harlen Lick   COLONOSCOPY  08/12/2013   Dr. Bridgett Camps adenomatous polyps   CRYOTHERAPY Left 2009   hand   PHOTODYNAMIC THERAPY  09/26/14   face and ear lobs  Dr. Harlen Lick   SQUAMOUS CELL CARCINOMA EXCISION Left 2010   hand    Allergies  Allergen Reactions   Ciprofloxacin Itching    Patient doesn't like the way it makes him feel   Lisinopril Itching    unknown   Sulfa Antibiotics Rash    Allergies as of 05/14/2024       Reactions   Ciprofloxacin Itching   Patient doesn't like the way it makes him feel   Lisinopril Itching   unknown   Sulfa Antibiotics Rash        Medication List        Accurate as of May 14, 2024 11:59 PM. If you have any questions, ask your nurse or doctor.          acetaminophen  500 MG tablet Commonly known as: TYLENOL  Take 500 mg by mouth daily.   amLODipine  2.5 MG tablet Commonly known as: NORVASC  TAKE 1 TABLET DAILY   cloNIDine  0.1 MG tablet Commonly known as: CATAPRES  TAKE 1 TABLET DAILY AS NEEDED, IF BLOOD PRESSURE GREATER THAN 180/100   Eliquis  5 MG Tabs tablet Generic drug: apixaban  TAKE 1 TABLET TWICE A DAY FOR ANTICOAGULATION   famotidine  20 MG tablet Commonly known as: PEPCID  TAKE 1 TABLET DAILY AS NEEDED FOR HEARTBURN OR INDIGESTION   finasteride  5 MG tablet Commonly known as: PROSCAR  TAKE 1 TABLET DAILY FOR URINATING DRIBBLING/PROSTATE ENLARGEMENT   levothyroxine  25 MCG tablet Commonly known as: SYNTHROID  TAKE 1 TABLET DAILY BEFORE BREAKFAST   metoprolol  succinate 50 MG 24 hr  tablet Commonly known as: TOPROL -XL TAKE 1 TABLET TWICE A DAY   WITH OR IMMEDIATELY FOLLOWING A MEAL TO CONTROL BLOOD PRESSURE AND HEART RHYTHM   pantoprazole  40 MG tablet Commonly known as: PROTONIX  TAKE 1 TABLET DAILY   simvastatin  10 MG tablet Commonly known as: ZOCOR  TAKE 1 TABLET DAILY TO LOWER CHOLESTEROL   terazosin  2 MG capsule Commonly known as: HYTRIN  TAKE 1 CAPSULE DAILY   valsartan  320 MG tablet Commonly known as: DIOVAN  Take 1 tablet (320 mg  total) by mouth daily.        Review of Systems  Constitutional:  Negative for appetite change, fatigue and fever.  HENT:  Positive for hearing loss. Negative for congestion and trouble swallowing.   Eyes:  Negative for visual disturbance.  Respiratory:  Negative for cough, shortness of breath and wheezing.   Cardiovascular:  Positive for leg swelling.  Gastrointestinal:  Negative for abdominal pain and constipation.  Genitourinary:  Positive for frequency. Negative for dysuria and urgency.  Musculoskeletal:  Positive for arthralgias and gait problem.  Skin:  Negative for color change.  Neurological:  Negative for speech difficulty, weakness and headaches.  Psychiatric/Behavioral:  Negative for confusion, hallucinations and sleep disturbance.        Feels better    Immunization History  Administered Date(s) Administered   Fluad Quad(high Dose 65+) 10/07/2022, 09/17/2023   Influenza Split 11/18/2007, 10/23/2009, 10/10/2010   Influenza Whole 09/15/2012, 09/16/2013   Influenza, High Dose Seasonal PF 09/24/2017, 09/20/2021   Influenza,inj,Quad PF,6+ Mos 09/17/2018   Influenza-Unspecified 09/29/2014, 09/14/2015, 09/26/2016, 09/05/2020   Moderna Covid-19 Vaccine Bivalent Booster 83yrs & up 09/05/2021, 10/17/2022   Moderna SARS-COV2 Booster Vaccination 12/31/2019, 05/23/2021, 10/01/2023   Moderna Sars-Covid-2 Vaccination 12/20/2019, 01/17/2020   PPD Test 10/10/2010, 11/27/2010   Pneumococcal Conjugate-13 02/08/2016    Pneumococcal Polysaccharide-23 01/17/1996   Pneumococcal-Unspecified 01/17/1996   Td 02/14/2006, 09/04/2016   Td (Adult), 2 Lf Tetanus Toxid, Preservative Free 02/14/2006, 09/04/2016   Tdap 03/05/2006   Zoster Recombinant(Shingrix) 03/31/2018, 06/23/2018   Zoster, Live 03/05/2006   Pertinent  Health Maintenance Due  Topic Date Due   INFLUENZA VACCINE  07/16/2024   Colonoscopy  Discontinued      06/22/2022    3:41 PM 07/04/2022    1:24 PM 10/21/2022    9:10 AM 03/20/2023    4:18 PM 05/07/2023    2:50 PM  Fall Risk  Falls in the past year?  1 1 0 0  Was there an injury with Fall?  0 1 0 0  Fall Risk Category Calculator  2 3 0 0  Fall Risk Category (Retired)  Moderate High    (RETIRED) Patient Fall Risk Level Moderate fall risk High fall risk High fall risk    Patient at Risk for Falls Due to    No Fall Risks   Fall risk Follow up   Falls evaluation completed Falls evaluation completed Falls evaluation completed   Functional Status Survey:    Vitals:   05/14/24 1037  BP: 100/67  Pulse: 68  Resp: 19  Temp: 98.1 F (36.7 C)  SpO2: 97%  Weight: 198 lb 9.6 oz (90.1 kg)   Body mass index is 31.11 kg/m. Physical Exam Constitutional:      Appearance: Normal appearance.  HENT:     Head: Normocephalic and atraumatic.     Right Ear: Tympanic membrane normal.     Left Ear: Tympanic membrane normal.     Nose: Nose normal.     Mouth/Throat:     Mouth: Mucous membranes are moist.  Eyes:     Extraocular Movements: Extraocular movements intact.     Conjunctiva/sclera: Conjunctivae normal.     Pupils: Pupils are equal, round, and reactive to light.  Cardiovascular:     Rate and Rhythm: Normal rate. Rhythm irregular.     Heart sounds: No murmur heard. Pulmonary:     Effort: Pulmonary effort is normal.     Breath sounds: No rales.  Abdominal:     Palpations: Abdomen is  soft.     Tenderness: There is no abdominal tenderness.  Musculoskeletal:        General: Normal range of  motion.     Cervical back: Normal range of motion and neck supple.     Right lower leg: Edema present.     Left lower leg: Edema present.     Comments: Trace edema BLE  Skin:    General: Skin is warm and dry.  Neurological:     General: No focal deficit present.     Mental Status: He is alert and oriented to person, place, and time. Mental status is at baseline.     Gait: Gait abnormal.  Psychiatric:        Mood and Affect: Mood normal.        Behavior: Behavior normal.        Thought Content: Thought content normal.     Labs reviewed: Recent Labs    07/29/23 0000  NA 142  K 4.1  CL 106  CO2 28*  BUN 16  CREATININE 1.3  CALCIUM 9.1   Recent Labs    07/29/23 0000  AST 16  ALT 10  ALKPHOS 81  ALBUMIN 3.7   Recent Labs    07/29/23 0000  WBC 6.0  NEUTROABS 4,008.00  HGB 14.8  HCT 44  PLT 176   Lab Results  Component Value Date   TSH 3.92 07/29/2023   No results found for: "HGBA1C" Lab Results  Component Value Date   CHOL 111 07/29/2023   HDL 37 07/29/2023   LDLCALC 55 07/29/2023   TRIG 108 07/29/2023   CHOLHDL 2.5 10/15/2022    Significant Diagnostic Results in last 30 days:  No results found.  Assessment/Plan  Essential hypertension on Metoprolol , Amlodipine ,  Valsartan , prn Clonidine ,  Bun/creat 18/1.08 10/03/23, fluctuating.   Hyperlipidemia on Simvastatin , LDL 55 07/29/23  Hypothyroidism on Levothyroxine , TSH 3.92 07/29/23  BPH with obstruction/lower urinary tract symptoms urinary frequency, on Terazosin , Proscar , saw Urology   Atrial fibrillation (HCC) heart rate is in control, taking Eliquis , Metoprolol . F/u Cardiology.   Osteoarthritis of right shoulder R shoulder, X-ray showed DJD, on Tylenol .   Macular degeneration  followed by Ophthalmology.   GERD (gastroesophageal reflux disease)  stable, on Pantoprazole . Hgb 14.8 10/03/23  Depression GDR Lexapro -off, stable.   Gait abnormality uses walker out of his room,  risk of  falling.   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 MMSE 29/30 03/21/23, CT head microvascular ischemic disease and cerebral atrophy. Vit B12 465 07/04/22. Saw neurology, 03/26/23 MOST full scope of tx, IVF/ABT indicated.    Family/ staff Communication: plan of care reviewed with the patient and charge nurse.   Labs/tests ordered:  none

## 2024-05-14 NOTE — Assessment & Plan Note (Signed)
 uses walker out of his room,  risk of falling.

## 2024-05-14 NOTE — Assessment & Plan Note (Signed)
heart rate is in control, taking Eliquis, Metoprolol. F/u Cardiology 

## 2024-05-14 NOTE — Assessment & Plan Note (Signed)
 on Metoprolol , Amlodipine ,  Valsartan , prn Clonidine ,  Bun/creat 18/1.08 10/03/23, fluctuating.

## 2024-05-14 NOTE — Assessment & Plan Note (Signed)
GDR Lexapro-off, stable.

## 2024-05-14 NOTE — Assessment & Plan Note (Signed)
 stable, on Pantoprazole . Hgb 14.8 10/03/23

## 2024-05-14 NOTE — Assessment & Plan Note (Signed)
followed by Ophthalmology

## 2024-05-14 NOTE — Assessment & Plan Note (Signed)
 functional in AL setting. MMSE 29/30 03/21/23, CT head microvascular ischemic disease and cerebral atrophy. Vit B12 465 07/04/22. Saw neurology, 03/26/23 MOST full scope of tx, IVF/ABT indicated.

## 2024-05-17 ENCOUNTER — Telehealth (INDEPENDENT_AMBULATORY_CARE_PROVIDER_SITE_OTHER): Payer: Self-pay | Admitting: Physician Assistant

## 2024-05-17 NOTE — Telephone Encounter (Signed)
 Patient daughter called and requested a call back with recommendations to keep cerumen at a minimum. Daughter states that his Hearing aide is constantly impacted with cerumen.

## 2024-05-18 NOTE — Telephone Encounter (Signed)
Thanks I spoke with her

## 2024-05-31 ENCOUNTER — Other Ambulatory Visit: Payer: Self-pay | Admitting: Nurse Practitioner

## 2024-06-07 ENCOUNTER — Other Ambulatory Visit: Payer: Self-pay | Admitting: Nurse Practitioner

## 2024-06-07 ENCOUNTER — Other Ambulatory Visit: Payer: Self-pay | Admitting: Family Medicine

## 2024-07-02 ENCOUNTER — Ambulatory Visit (INDEPENDENT_AMBULATORY_CARE_PROVIDER_SITE_OTHER): Admitting: Physician Assistant

## 2024-07-02 VITALS — BP 150/90 | HR 74

## 2024-07-02 DIAGNOSIS — H6120 Impacted cerumen, unspecified ear: Secondary | ICD-10-CM

## 2024-07-02 NOTE — Progress Notes (Signed)
 Dear Dr. Adline, Here is my assessment for our mutual patient, Phillip [name]. Thank you for allowing me the opportunity to care for your patient. Please do not hesitate to contact me should you have any other questions. Sincerely, Chyrl Cohen PA-C  Otolaryngology Clinic Note Referring provider: Dr. Adline HPI:  Phillip [name] is a 89 y.o. male kindly referred by Dr. Adline   The patient is a 89 year old male seen in the office for follow-up evaluation of cerumen impaction.  He is accompanied by his daughter today.  He was last seen in the office on 04/02/2024.  Since the last office visit they note they have gone back to audiology, he has new hearing aids.  His daughter is concerned that he has reaccumulation of wax.  She notes that when she removes the hearing aids from his ear they have wax on them and has to constantly clean them.  No other acute concerns today.   Independent Review of Additional Tests or Records:  None   PMH/Meds/All/SocHx/FamHx/ROS:   Past Medical History:  Diagnosis Date   Atrial fibrillation (HCC) 03/26/2011   Basal cell carcinoma of other specified sites of skin 4/10/012   Basal cell carcinoma of skin, site unspecified 03/25/2012   Benign neoplasm of colon 03/25/2012   adenomatous polyps   Broken arm 11/25/2017   Colon polyp 2014   Degeneration of lumbar or lumbosacral intervertebral disc 09/24/2011   Disturbance of skin sensation 01/28/2012   Diverticulosis 2014   Dysphagia, unspecified(787.20) 03/26/2011   Elevated prostate specific antigen (PSA) 03/26/2011   Exposure to unspecified radiation 03/26/2011   Family history of colon cancer 07/16/2013   GERD (gastroesophageal reflux disease) 07/16/2013   Hearing deficit 12/21/2013   Hemangioma of unspecified site 03/26/2011   Hx of adenomatous colonic polyps 07/16/2013   Hypertrophy of prostate with urinary obstruction and other lower urinary tract symptoms (LUTS) 03/26/2011   Ingrowing nail 03/26/2011   Internal hemorrhoids without  mention of complication 06/15/13   Keratosis, actinic 01/28/2012   Long term (current) use of anticoagulants 03/26/2011   Long term current use of anticoagulant therapy 07/23/2016   Lumbago 03/26/2011   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   Osteoarthrosis, unspecified whether generalized or localized, unspecified site 11/24/2012   Other and unspecified hyperlipidemia 03/26/2011   Other malaise and fatigue 03/26/2011   Pain in joint, forearm 11/24/2012   Pain in joint, lower leg 06/15/13   left knee   Paresthesia of left arm 07/05/2014   Rectal bleed 06/15/13   Reflux esophagitis 03/26/2011   Sacroiliitis, not elsewhere classified (HCC) 05/28/2011   Scoliosis (and kyphoscoliosis), idiopathic 05/28/2011   Squamous cell carcinoma of skin of upper limb, including shoulder 03/26/2011   Tinnitus 12/21/2013   Transient ischemic attack (TIA), and cerebral infarction without residual deficits(V12.54) 03/26/2011   Unspecified essential hypertension 03/26/2011     Past Surgical History:  Procedure Laterality Date   BASAL CELL CARCINOMA EXCISION Left 12/10/14   Dr. Bard Molt   COLONOSCOPY  08/12/2013   Dr. Albertus adenomatous polyps   CRYOTHERAPY Left 2009   hand   PHOTODYNAMIC THERAPY  09/26/14   face and ear lobs  Dr. Bard Molt   SQUAMOUS CELL CARCINOMA EXCISION Left 2010   hand    Family History  Problem Relation Age of Onset   Cancer Mother        colon   Heart disease Father        MI     Social  Connections: Moderately Isolated (03/25/2022)   Social Connection and Isolation Panel    Frequency of Communication with Friends and Family: More than three times a week    Frequency of Social Gatherings with Friends and Family: More than three times a week    Attends Religious Services: Never    Database administrator or Organizations: No    Attends Engineer, structural: Never    Marital Status: Married      Current Outpatient Medications:    metoprolol  succinate  (TOPROL -XL) 50 MG 24 hr tablet, TAKE 1 TABLET TWICE A DAY   WITH OR IMMEDIATELY FOLLOWING A MEAL TO CONTROL BLOOD PRESSURE AND HEART RHYTHM, Disp: 180 tablet, Rfl: 3   acetaminophen  (TYLENOL ) 500 MG tablet, Take 500 mg by mouth daily., Disp: , Rfl:    amLODipine  (NORVASC ) 2.5 MG tablet, TAKE 1 TABLET DAILY, Disp: 90 tablet, Rfl: 3   cloNIDine  (CATAPRES ) 0.1 MG tablet, TAKE 1 TABLET DAILY AS NEEDED, IF BLOOD PRESSURE GREATER THAN 180/100, Disp: 90 tablet, Rfl: 3   ELIQUIS  5 MG TABS tablet, TAKE 1 TABLET TWICE A DAY FOR ANTICOAGULATION, Disp: 180 tablet, Rfl: 3   famotidine  (PEPCID ) 20 MG tablet, TAKE 1 TABLET DAILY AS NEEDED FOR HEARTBURN OR INDIGESTION, Disp: 60 tablet, Rfl: 5   finasteride  (PROSCAR ) 5 MG tablet, TAKE 1 TABLET DAILY FOR URINATING DRIBBLING/PROSTATE ENLARGEMENT, Disp: 90 tablet, Rfl: 3   levothyroxine  (SYNTHROID ) 25 MCG tablet, TAKE 1 TABLET DAILY BEFORE BREAKFAST, Disp: 90 tablet, Rfl: 3   pantoprazole  (PROTONIX ) 40 MG tablet, TAKE 1 TABLET DAILY, Disp: 90 tablet, Rfl: 1   simvastatin  (ZOCOR ) 10 MG tablet, TAKE 1 TABLET DAILY TO LOWER CHOLESTEROL, Disp: 90 tablet, Rfl: 1   terazosin  (HYTRIN ) 2 MG capsule, TAKE 1 CAPSULE DAILY, Disp: 90 capsule, Rfl: 3   valsartan  (DIOVAN ) 320 MG tablet, TAKE 1 TABLET DAILY, Disp: 90 tablet, Rfl: 3   Physical Exam:   BP (!) 150/90   Pulse 74   SpO2 97%   Pertinent Findings  CN II-XII intact Bilateral external auditory canals with minimal cerumen, TMs intact with well-pneumatized middle ear space No obvious neck masses/lymphadenopathy/thyromegaly No respiratory distress or stridor  Seprately Identifiable Procedures:  None  Impression & Plans:  [name] is a 89 y.o. male with the following   Ceruminosis-  Follow-up evaluation for cerumen.  The patient did have minimal cerumen that was in the external auditory canals this was removed with suction, no impaction.  The patient may follow-up in 6 months for repeat evaluation or sooner as  needed.  The patient and his daughter verbalized understanding and agreement to today's plan had no further questions or concerns.   - f/u 6 months   Thank you for allowing me the opportunity to care for your patient. Please do not hesitate to contact me should you have any other questions.  Sincerely, Chyrl Cohen PA-C [facility] Phone: [phone] Fax: [phone]  07/02/2024, 2:23 PM

## 2024-07-22 ENCOUNTER — Non-Acute Institutional Stay: Payer: Self-pay | Admitting: Nurse Practitioner

## 2024-07-22 ENCOUNTER — Encounter: Payer: Self-pay | Admitting: Nurse Practitioner

## 2024-07-22 DIAGNOSIS — E039 Hypothyroidism, unspecified: Secondary | ICD-10-CM

## 2024-07-22 DIAGNOSIS — F0390 Unspecified dementia without behavioral disturbance: Secondary | ICD-10-CM

## 2024-07-22 DIAGNOSIS — E782 Mixed hyperlipidemia: Secondary | ICD-10-CM | POA: Diagnosis not present

## 2024-07-22 DIAGNOSIS — N401 Enlarged prostate with lower urinary tract symptoms: Secondary | ICD-10-CM

## 2024-07-22 DIAGNOSIS — K219 Gastro-esophageal reflux disease without esophagitis: Secondary | ICD-10-CM

## 2024-07-22 DIAGNOSIS — R269 Unspecified abnormalities of gait and mobility: Secondary | ICD-10-CM

## 2024-07-22 DIAGNOSIS — I480 Paroxysmal atrial fibrillation: Secondary | ICD-10-CM

## 2024-07-22 DIAGNOSIS — I10 Essential (primary) hypertension: Secondary | ICD-10-CM | POA: Diagnosis not present

## 2024-07-22 DIAGNOSIS — F418 Other specified anxiety disorders: Secondary | ICD-10-CM

## 2024-07-22 DIAGNOSIS — N138 Other obstructive and reflux uropathy: Secondary | ICD-10-CM

## 2024-07-22 DIAGNOSIS — F5105 Insomnia due to other mental disorder: Secondary | ICD-10-CM

## 2024-07-22 NOTE — Progress Notes (Unsigned)
 Location:  [facility]  Nursing Home Room Number: [id number] Place of Service:  ALF [phone]) Provider:  [place] X, NP  Inaki Vantine X, NP  Patient Care Team: Leevi Cullars X, NP as PCP - General (Internal Medicine) Joshua Blamer, MD as Consulting Physician (Dermatology) [place] Cleotilde Garnette HERO, MD as Attending Physician [facility] Medicine)  Extended Emergency Contact Information Primary Emergency Contact: [name],[name] Address: [address] [name], [place] [address] [place] Phone: [phone] Relation: Daughter Secondary Emergency Contact: Thomsen,Carol Mobile Phone: [phone] Relation: Daughter  Code Status:  FULL CODE  Goals of care: Advanced Directive information    07/22/2024   11:33 AM  Advanced Directives  Does Patient Have a Medical Advance Directive? Yes  Type of Estate agent of [place];Living will  Does patient want to make changes to medical advance directive? No - Patient declined  Copy of Healthcare Power of Attorney in Chart? Yes - validated most recent copy scanned in chart (See row information)     Chief Complaint  Patient presents with  . Acute Visit    Elevated blood pressure / dizziness episode    HPI:  Pt is a 89 y.o. male seen today for an acute visit for elevated Bp 210/110>>131/80, c/o dizziness, resolved after Clonidine  x1. The patient denied HA, change of vision, chest pain/pressure, SOB, palpitation, nausea, vomiting, or dysuria. He is afebrile, no focal neurological symptoms.   HTN, on Metoprolol , Amlodipine ,  Valsartan , prn Clonidine ,  Bun/creat 18/1.08 10/03/23             HLD on Simvastatin , LDL 55 07/29/23             Hypothyroidism, on Levothyroxine , TSH 3.92 07/29/23             BPH, urinary frequency, on Terazosin , Proscar , saw Urology              Afib, heart rate is in control, taking Eliquis , Metoprolol . F/u Cardiology.              OA R shoulder, X-ray showed DJD, on  Tylenol .              MD, followed by Ophthalmology.              GERD stable, on Pantoprazole . Hgb 14.8 10/03/23             Depression, improved, situational,  GDR Lexapro -off, stable.              Visual hallucination, resolved,  CT head negative, underwent neurology evaluation             Gait abnormality, uses walker out of his room,  risk of falling.              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 MMSE 29/30 03/21/23, CT head microvascular ischemic disease and cerebral atrophy. Vit B12 465 07/04/22. Saw neurology, 03/26/23 MOST full scope of tx, IVF/ABT indicated.        Past Medical History:  Diagnosis Date  . Atrial fibrillation (HCC) 03/26/2011  . Basal cell carcinoma of other specified sites of skin 4/10/012  . Basal cell carcinoma of skin, site unspecified 03/25/2012  . Benign neoplasm of colon 03/25/2012   adenomatous polyps  . Broken arm 11/25/2017  . Colon polyp 2014  . Degeneration of lumbar or lumbosacral intervertebral disc 09/24/2011  . Disturbance of skin sensation 01/28/2012  .  Diverticulosis 2014  . Dysphagia, unspecified(787.20) 03/26/2011  . Elevated prostate specific antigen (PSA) 03/26/2011  . Exposure to unspecified radiation 03/26/2011  . Family history of colon cancer 07/16/2013  . GERD (gastroesophageal reflux disease) 07/16/2013  . Hearing deficit 12/21/2013  . Hemangioma of unspecified site 03/26/2011  . Hx of adenomatous colonic polyps 07/16/2013  . Hypertrophy of prostate with urinary obstruction and other lower urinary tract symptoms (LUTS) 03/26/2011  . Ingrowing nail 03/26/2011  . Internal hemorrhoids without mention of complication 06/15/13  . Keratosis, actinic 01/28/2012  . Long term (current) use of anticoagulants 03/26/2011  . Long term current use of anticoagulant therapy 07/23/2016  . Lumbago 03/26/2011  . 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 Osteoarthrosis, unspecified whether generalized or localized, unspecified site  11/24/2012  . Other and unspecified hyperlipidemia 03/26/2011  . Other malaise and fatigue 03/26/2011  . Pain in joint, forearm 11/24/2012  . Pain in joint, lower leg 06/15/13   left knee  . Paresthesia of left arm 07/05/2014  . Rectal bleed 06/15/13  . Reflux esophagitis 03/26/2011  . Sacroiliitis, not elsewhere classified (HCC) 05/28/2011  . Scoliosis (and kyphoscoliosis), idiopathic 05/28/2011  . 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 Tinnitus 12/21/2013  . 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 Unspecified essential hypertension 03/26/2011   Past Surgical History:  Procedure Laterality Date  . BASAL CELL CARCINOMA EXCISION Left 12/10/14   Dr. Bard Molt  . COLONOSCOPY  08/12/2013   Dr. Albertus adenomatous polyps  . CRYOTHERAPY Left 2009   hand  . PHOTODYNAMIC THERAPY  09/26/14   face and ear lobs  Dr. Bard Molt  . SQUAMOUS CELL CARCINOMA EXCISION Left 2010   hand    Allergies  Allergen Reactions  . Ciprofloxacin Itching    Patient doesn't like the way it makes him feel  . Lisinopril Itching    unknown  . Sulfa Antibiotics Rash    Allergies as of 07/22/2024       Reactions   Ciprofloxacin Itching   Patient doesn't like the way it makes him feel   Lisinopril Itching   unknown   Sulfa Antibiotics Rash        Medication List        Accurate as of July 22, 2024 11:59 PM. If you have any questions, ask your nurse or doctor.          acetaminophen  500 MG tablet Commonly known as: TYLENOL  Take 500 mg by mouth daily.   amLODipine  2.5 MG tablet Commonly known as: NORVASC  TAKE 1 TABLET DAILY   cloNIDine  0.1 MG tablet Commonly known as: CATAPRES  TAKE 1 TABLET DAILY AS NEEDED, IF BLOOD PRESSURE GREATER THAN 180/100   Eliquis  5 MG Tabs tablet Generic drug: apixaban  TAKE 1 TABLET TWICE A DAY FOR ANTICOAGULATION   famotidine  20 MG tablet Commonly known as:  PEPCID  TAKE 1 TABLET DAILY AS NEEDED FOR HEARTBURN OR INDIGESTION   finasteride  5 MG tablet Commonly known as: PROSCAR  TAKE 1 TABLET DAILY FOR URINATING DRIBBLING/PROSTATE ENLARGEMENT   levothyroxine  25 MCG tablet Commonly known as: SYNTHROID  TAKE 1 TABLET DAILY BEFORE BREAKFAST   metoprolol  succinate 50 MG 24 hr tablet Commonly known as: TOPROL -XL TAKE 1 TABLET TWICE A DAY   WITH OR IMMEDIATELY FOLLOWING A MEAL TO CONTROL BLOOD PRESSURE AND HEART RHYTHM   pantoprazole  40 MG tablet Commonly known as: PROTONIX  TAKE 1 TABLET DAILY  PRESERVISION AREDS PO Take by mouth.   simvastatin  10 MG tablet Commonly known as: ZOCOR  TAKE 1 TABLET DAILY TO LOWER CHOLESTEROL   terazosin  2 MG capsule Commonly known as: HYTRIN  TAKE 1 CAPSULE DAILY   valsartan  320 MG tablet Commonly known as: DIOVAN  TAKE 1 TABLET DAILY        Review of Systems  Constitutional:  Negative for appetite change, fatigue and fever.  HENT:  Positive for hearing loss. Negative for congestion and trouble swallowing.   Eyes:  Negative for visual disturbance.  Respiratory:  Negative for cough, shortness of breath and wheezing.   Cardiovascular:  Positive for leg swelling.  Gastrointestinal:  Negative for abdominal pain and constipation.  Genitourinary:  Positive for frequency. Negative for dysuria and urgency.  Musculoskeletal:  Positive for arthralgias and gait problem.  Skin:  Negative for color change.  Neurological:  Negative for speech difficulty, weakness and headaches.  Psychiatric/Behavioral:  Negative for confusion, hallucinations and sleep disturbance.        Feels better    Immunization History  Administered Date(s) Administered  . Fluad Quad(high Dose 65+) 10/07/2022, 09/17/2023  . Influenza Split 11/18/2007, 10/23/2009, 10/10/2010  . Influenza Whole 09/15/2012, 09/16/2013  . Influenza, High Dose Seasonal PF 09/24/2017, 09/20/2021  . Influenza,inj,Quad PF,6+ Mos 09/17/2018  .  Influenza-Unspecified 09/29/2014, 09/14/2015, 09/26/2016, 09/05/2020  . Moderna Covid-19 Vaccine Bivalent Booster 25yrs & up 09/05/2021, 10/17/2022  . Moderna SARS-COV2 Booster Vaccination 12/31/2019, 05/23/2021, 10/01/2023  . Moderna Sars-Covid-2 Vaccination 12/20/2019, 01/17/2020  . PPD Test 10/10/2010, 11/27/2010  . Pneumococcal Conjugate-13 02/08/2016  . Pneumococcal Polysaccharide-23 01/17/1996  . Pneumococcal-Unspecified 01/17/1996  . Td 02/14/2006, 09/04/2016  . Td (Adult), 2 Lf Tetanus Toxid, Preservative Free 02/14/2006, 09/04/2016  . Tdap 03/05/2006  . Zoster Recombinant(Shingrix) 03/31/2018, 06/23/2018  . Zoster, Live 03/05/2006   Pertinent  Health Maintenance Due  Topic Date Due  . INFLUENZA VACCINE  07/16/2024  . Colonoscopy  Discontinued      06/22/2022    3:41 PM 07/04/2022    1:24 PM 10/21/2022    9:10 AM 03/20/2023    4:18 PM 05/07/2023    2:50 PM  Fall Risk  Falls in the past year?  1 1 0 0  Was there an injury with Fall?  0 1 0 0  Fall Risk Category Calculator  2 3 0 0  Fall Risk Category (Retired)  Moderate  High     (RETIRED) Patient Fall Risk Level Moderate fall risk  High fall risk  High fall risk     Patient at Risk for Falls Due to    No Fall Risks   Fall risk Follow up   Falls evaluation completed  Falls evaluation completed Falls evaluation completed     Data saved with a previous flowsheet row definition   Functional Status Survey:    Vitals:   07/22/24 1129  BP: 131/80  Pulse: 71  Resp: 18  Temp: (!) 97.5 F (36.4 C)  SpO2: 98%  Weight: 198 lb 12.8 oz (90.2 kg)  Height: 5' 7 (1.702 m)   Body mass index is 31.14 kg/m. Physical Exam Constitutional:      Appearance: Normal appearance.  HENT:     Head: Normocephalic and atraumatic.     Right Ear: Tympanic membrane normal.     Left Ear: Tympanic membrane normal.     Nose: Nose normal.     Mouth/Throat:     Mouth: Mucous membranes are moist.  Eyes:     Extraocular Movements:  Extraocular  movements intact.     Conjunctiva/sclera: Conjunctivae normal.     Pupils: Pupils are equal, round, and reactive to light.  Cardiovascular:     Rate and Rhythm: Normal rate. Rhythm irregular.     Heart sounds: No murmur heard. Pulmonary:     Effort: Pulmonary effort is normal.     Breath sounds: No rales.  Abdominal:     Palpations: Abdomen is soft.     Tenderness: There is no abdominal tenderness.  Musculoskeletal:        General: Normal range of motion.     Cervical back: Normal range of motion and neck supple.     Right lower leg: Edema present.     Left lower leg: Edema present.     Comments: Trace edema BLE  Skin:    General: Skin is warm and dry.  Neurological:     General: No focal deficit present.     Mental Status: He is alert and oriented to person, place, and time. Mental status is at baseline.     Gait: Gait abnormal.  Psychiatric:        Mood and Affect: Mood normal.        Behavior: Behavior normal.        Thought Content: Thought content normal.     Labs reviewed: Recent Labs    07/29/23 0000  NA 142  K 4.1  CL 106  CO2 28*  BUN 16  CREATININE 1.3  CALCIUM 9.1   Recent Labs    07/29/23 0000  AST 16  ALT 10  ALKPHOS 81  ALBUMIN 3.7   Recent Labs    07/29/23 0000  WBC 6.0  NEUTROABS 4,008.00  HGB 14.8  HCT 44  PLT 176   Lab Results  Component Value Date   TSH 3.92 07/29/2023   No results found for: HGBA1C Lab Results  Component Value Date   CHOL 111 07/29/2023   HDL 37 07/29/2023   LDLCALC 55 07/29/2023   TRIG 108 07/29/2023   CHOLHDL 2.5 10/15/2022    Significant Diagnostic Results in last 30 days:  No results found.  Assessment/Plan Essential hypertension Bp normalized, on Metoprolol , Amlodipine ,  Valsartan , prn Clonidine ,  Bun/creat 18/1.08 10/03/23  Hyperlipidemia on Simvastatin , LDL 55 07/29/23  Hypothyroidism on Levothyroxine , TSH 3.92 07/29/23  BPH with obstruction/lower urinary tract symptoms  urinary  frequency, on Terazosin , Proscar , saw Urology   Atrial fibrillation (HCC) heart rate is in control, taking Eliquis , Metoprolol . F/u Cardiology.   Gait abnormality Ambulates with walker.   GERD (gastroesophageal reflux disease) stable, on Pantoprazole . Hgb 14.8 10/03/23  Insomnia secondary to depression with anxiety  improved, situational,  GDR Lexapro -off, stable.   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 MMSE 29/30 03/21/23, CT head microvascular ischemic disease and cerebral atrophy. Vit B12 465 07/04/22. Saw neurology, 03/26/23 MOST full scope of tx, IVF/ABT indicated.      Family/ staff Communication: plan of care reviewed with the patient and charge nurse.   Labs/tests ordered:   none

## 2024-07-22 NOTE — Assessment & Plan Note (Signed)
 improved, situational,  GDR Lexapro -off, stable.

## 2024-07-22 NOTE — Assessment & Plan Note (Signed)
 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 MMSE 29/30 03/21/23, CT head microvascular ischemic disease and cerebral atrophy. Vit B12 465 07/04/22. Saw neurology, 03/26/23 MOST full scope of tx, IVF/ABT indicated.

## 2024-07-22 NOTE — Assessment & Plan Note (Signed)
Ambulates with walker.  

## 2024-07-22 NOTE — Assessment & Plan Note (Signed)
 urinary frequency, on Terazosin , Proscar , saw Urology

## 2024-07-22 NOTE — Assessment & Plan Note (Signed)
 stable, on Pantoprazole . Hgb 14.8 10/03/23

## 2024-07-22 NOTE — Assessment & Plan Note (Signed)
on Levothyroxine, TSH 3.92 07/29/23

## 2024-07-22 NOTE — Assessment & Plan Note (Signed)
 Bp normalized, on Metoprolol , Amlodipine ,  Valsartan , prn Clonidine ,  Bun/creat 18/1.08 10/03/23

## 2024-07-22 NOTE — Assessment & Plan Note (Signed)
on Simvastatin, LDL 55 07/29/23

## 2024-07-22 NOTE — Assessment & Plan Note (Signed)
heart rate is in control, taking Eliquis, Metoprolol. F/u Cardiology 

## 2024-07-23 ENCOUNTER — Encounter: Payer: Self-pay | Admitting: Nurse Practitioner

## 2024-07-26 ENCOUNTER — Encounter: Payer: Self-pay | Admitting: Nurse Practitioner

## 2024-07-26 NOTE — Progress Notes (Signed)
 This encounter was created in error - please disregard.

## 2024-07-26 NOTE — Progress Notes (Deleted)
 Location:  [facility] Room Number: [id number] Place of Service:  ALF [phone]) Provider:  [name], [name], NP  Patient Care Team: [name], [name], NP as PCP - General (Internal Medicine) Joshua Blamer, MD as Consulting Physician (Dermatology) [place] Cleotilde Garnette HERO, MD as Attending Physician [facility] Medicine)  Extended Emergency Contact Information Primary Emergency Contact: [name],[name] Address: [address] [place], [place] [address] [place] Phone: [phone] Relation: Daughter Secondary Emergency Contact: Thomsen,Carol Mobile Phone: [phone] Relation: Daughter  Code Status:  Full Code  Goals of care: Advanced Directive information    07/22/2024   11:33 AM  Advanced Directives  Does Patient Have a Medical Advance Directive? Yes  Type of Estate agent of [place];Living will  Does patient want to make changes to medical advance directive? No - Patient declined  Copy of Healthcare Power of Attorney in Chart? Yes - validated most recent copy scanned in chart (See row information)     Chief Complaint  Patient presents with   Altered Mental Status    HPI:  Pt is a 89 y.o. male seen today for an acute visit for    Past Medical History:  Diagnosis Date   Atrial fibrillation (HCC) 03/26/2011   Basal cell carcinoma of other specified sites of skin 4/10/012   Basal cell carcinoma of skin, site unspecified 03/25/2012   Benign neoplasm of colon 03/25/2012   adenomatous polyps   Broken arm 11/25/2017   Colon polyp 2014   Degeneration of lumbar or lumbosacral intervertebral disc 09/24/2011   Disturbance of skin sensation 01/28/2012   Diverticulosis 2014   Dysphagia, unspecified(787.20) 03/26/2011   Elevated prostate specific antigen (PSA) 03/26/2011   Exposure to unspecified radiation 03/26/2011   Family history of colon cancer 07/16/2013   GERD (gastroesophageal reflux disease) 07/16/2013   Hearing  deficit 12/21/2013   Hemangioma of unspecified site 03/26/2011   Hx of adenomatous colonic polyps 07/16/2013   Hypertrophy of prostate with urinary obstruction and other lower urinary tract symptoms (LUTS) 03/26/2011   Ingrowing nail 03/26/2011   Internal hemorrhoids without mention of complication 06/15/13   Keratosis, actinic 01/28/2012   Long term (current) use of anticoagulants 03/26/2011   Long term current use of anticoagulant therapy 07/23/2016   Lumbago 03/26/2011   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   Osteoarthrosis, unspecified whether generalized or localized, unspecified site 11/24/2012   Other and unspecified hyperlipidemia 03/26/2011   Other malaise and fatigue 03/26/2011   Pain in joint, forearm 11/24/2012   Pain in joint, lower leg 06/15/13   left knee   Paresthesia of left arm 07/05/2014   Rectal bleed 06/15/13   Reflux esophagitis 03/26/2011   Sacroiliitis, not elsewhere classified (HCC) 05/28/2011   Scoliosis (and kyphoscoliosis), idiopathic 05/28/2011   Squamous cell carcinoma of skin of upper limb, including shoulder 03/26/2011   Tinnitus 12/21/2013   Transient ischemic attack (TIA), and cerebral infarction without residual deficits(V12.54) 03/26/2011   Unspecified essential hypertension 03/26/2011   Past Surgical History:  Procedure Laterality Date   BASAL CELL CARCINOMA EXCISION Left 12/10/14   Dr. Blamer Joshua   COLONOSCOPY  08/12/2013   Dr. Albertus adenomatous polyps   CRYOTHERAPY Left 2009   hand   PHOTODYNAMIC THERAPY  09/26/14   face and ear lobs  Dr. Blamer Joshua   SQUAMOUS CELL CARCINOMA EXCISION Left 2010   hand    Allergies  Allergen Reactions   Ciprofloxacin Itching    Patient doesn't like the way it makes him feel   Lisinopril Itching    unknown   Sulfa Antibiotics Rash    Outpatient Encounter Medications as of 07/26/2024  Medication Sig   acetaminophen  (TYLENOL ) 500 MG tablet Take 500 mg by mouth daily.   amLODipine  (NORVASC ) 2.5 MG  tablet TAKE 1 TABLET DAILY   cloNIDine  (CATAPRES ) 0.1 MG tablet TAKE 1 TABLET DAILY AS NEEDED, IF BLOOD PRESSURE GREATER THAN 180/100   ELIQUIS  5 MG TABS tablet TAKE 1 TABLET TWICE A DAY FOR ANTICOAGULATION   finasteride  (PROSCAR ) 5 MG tablet TAKE 1 TABLET DAILY FOR URINATING DRIBBLING/PROSTATE ENLARGEMENT   levothyroxine  (SYNTHROID ) 25 MCG tablet TAKE 1 TABLET DAILY BEFORE BREAKFAST   metoprolol  succinate (TOPROL -XL) 50 MG 24 hr tablet TAKE 1 TABLET TWICE A DAY   WITH OR IMMEDIATELY FOLLOWING A MEAL TO CONTROL BLOOD PRESSURE AND HEART RHYTHM   Multiple Vitamins-Minerals (PRESERVISION AREDS PO) Take by mouth.   pantoprazole  (PROTONIX ) 40 MG tablet TAKE 1 TABLET DAILY   simvastatin  (ZOCOR ) 10 MG tablet TAKE 1 TABLET DAILY TO LOWER CHOLESTEROL   terazosin  (HYTRIN ) 2 MG capsule TAKE 1 CAPSULE DAILY   valsartan  (DIOVAN ) 320 MG tablet TAKE 1 TABLET DAILY   famotidine  (PEPCID ) 20 MG tablet TAKE 1 TABLET DAILY AS NEEDED FOR HEARTBURN OR INDIGESTION (Patient not taking: Reported on 07/26/2024)   No facility-administered encounter medications on file as of 07/26/2024.    Review of Systems  Immunization History  Administered Date(s) Administered   Fluad Quad(high Dose 65+) 10/07/2022, 09/17/2023   Influenza Split 11/18/2007, 10/23/2009, 10/10/2010   Influenza Whole 09/15/2012, 09/16/2013   Influenza, High Dose Seasonal PF 09/24/2017, 09/20/2021   Influenza,inj,Quad PF,6+ Mos 09/17/2018   Influenza-Unspecified 09/29/2014, 09/14/2015, 09/26/2016, 09/05/2020   Moderna Covid-19 Vaccine Bivalent Booster 67yrs & up 09/05/2021, 10/17/2022   Moderna SARS-COV2 Booster Vaccination 12/31/2019, 05/23/2021, 10/01/2023   Moderna Sars-Covid-2 Vaccination 12/20/2019, 01/17/2020   PPD Test 10/10/2010, 11/27/2010   Pneumococcal Conjugate-13 02/08/2016   Pneumococcal Polysaccharide-23 01/17/1996   Pneumococcal-Unspecified 01/17/1996   Td 02/14/2006, 09/04/2016   Td (Adult), 2 Lf Tetanus Toxid, Preservative Free  02/14/2006, 09/04/2016   Tdap 03/05/2006   Zoster Recombinant(Shingrix) 03/31/2018, 06/23/2018   Zoster, Live 03/05/2006   Pertinent  Health Maintenance Due  Topic Date Due   INFLUENZA VACCINE  08/16/2024 (Originally 07/16/2024)   Colonoscopy  Discontinued      06/22/2022    3:41 PM 07/04/2022    1:24 PM 10/21/2022    9:10 AM 03/20/2023    4:18 PM 05/07/2023    2:50 PM  Fall Risk  Falls in the past year?  1 1 0 0  Was there an injury with Fall?  0 1 0 0  Fall Risk Category Calculator  2 3 0 0  Fall Risk Category (Retired)  Moderate  High     (RETIRED) Patient Fall Risk Level Moderate fall risk  High fall risk  High fall risk     Patient at Risk for Falls Due to    No Fall Risks   Fall risk Follow up   Falls evaluation completed  Falls evaluation completed Falls evaluation completed     Data saved with a previous flowsheet row definition   Functional Status Survey:    Vitals:   07/26/24 1135  BP: 120/66  Pulse: (!) 59  Resp: 18  Temp: 98 F (36.7 C)  SpO2: 97%  Weight: 198 lb 12.8 oz (90.2 kg)  Height: 5' 7 (1.702 m)   Body mass index is 31.14 kg/m. Physical Exam  Labs reviewed: Recent Labs    07/29/23 0000 02/12/24 0000  NA 142 142  K 4.1 4.1  CL 106 109*  CO2 28* 22  BUN 16 16  CREATININE 1.3 1.1  CALCIUM 9.1 8.8   Recent Labs    07/29/23 0000 02/12/24 0000  AST 16 15  ALT 10 9*  ALKPHOS 81 78  ALBUMIN 3.7 3.5   Recent Labs    07/29/23 0000 02/12/24 0000  WBC 6.0 6.1  NEUTROABS 4,008.00  --   HGB 14.8 14.2  HCT 44 42  PLT 176 159   Lab Results  Component Value Date   TSH 3.05 02/12/2024   No results found for: HGBA1C Lab Results  Component Value Date   CHOL 111 07/29/2023   HDL 37 07/29/2023   LDLCALC 55 07/29/2023   TRIG 108 07/29/2023   CHOLHDL 2.5 10/15/2022    Significant Diagnostic Results in last 30 days:  No results found.  Assessment/Plan There are no diagnoses linked to this encounter.   Family/ staff  Communication: ***  Labs/tests ordered:  ***

## 2024-08-02 ENCOUNTER — Non-Acute Institutional Stay: Payer: Self-pay | Admitting: Nurse Practitioner

## 2024-08-02 ENCOUNTER — Encounter: Payer: Self-pay | Admitting: Nurse Practitioner

## 2024-08-02 DIAGNOSIS — E039 Hypothyroidism, unspecified: Secondary | ICD-10-CM | POA: Diagnosis not present

## 2024-08-02 DIAGNOSIS — I480 Paroxysmal atrial fibrillation: Secondary | ICD-10-CM

## 2024-08-02 DIAGNOSIS — I10 Essential (primary) hypertension: Secondary | ICD-10-CM | POA: Diagnosis not present

## 2024-08-02 DIAGNOSIS — K219 Gastro-esophageal reflux disease without esophagitis: Secondary | ICD-10-CM

## 2024-08-02 DIAGNOSIS — N138 Other obstructive and reflux uropathy: Secondary | ICD-10-CM

## 2024-08-02 DIAGNOSIS — F0390 Unspecified dementia without behavioral disturbance: Secondary | ICD-10-CM

## 2024-08-02 DIAGNOSIS — E782 Mixed hyperlipidemia: Secondary | ICD-10-CM | POA: Diagnosis not present

## 2024-08-02 DIAGNOSIS — N401 Enlarged prostate with lower urinary tract symptoms: Secondary | ICD-10-CM

## 2024-08-02 DIAGNOSIS — D72829 Elevated white blood cell count, unspecified: Secondary | ICD-10-CM | POA: Insufficient documentation

## 2024-08-02 NOTE — Assessment & Plan Note (Signed)
 generalized weakness, noted blood in urine, but denied burning sensation or increased urinary frequency/urgency, denied abd pain, he is afebrile, no noted SOB, cough, no O2 desaturation. State wbc 21.5 08/31/24, HPOA/patient desires antibiotics at [facility] instead of ED eval/tx.  07/31/24 wbc 21.5, Hgb 15.2, plt 160, neutrophils 94, Na 136, K 4.4, Bun 17, creat 1.1  Continue 7 day course of empirical Rocephin  1gm IM every day Pending UA C/S Will repeat CBC/diff, CMP/eGFR in am.

## 2024-08-02 NOTE — Assessment & Plan Note (Signed)
 functional in AL setting. MMSE 29/30 03/21/23, CT head microvascular ischemic disease and cerebral atrophy. Vit B12 465 07/04/22. Saw neurology, 03/26/23 MOST full scope of tx, IVF/ABT indicated.

## 2024-08-02 NOTE — Assessment & Plan Note (Signed)
 heart rate is in control, taking Eliquis , Metoprolol . F/u Cardiology.  May hold Eliquis  if blood in urine continues.

## 2024-08-02 NOTE — Assessment & Plan Note (Signed)
 urinary frequency, on Terazosin , Proscar , saw Urology

## 2024-08-02 NOTE — Assessment & Plan Note (Signed)
on Simvastatin, LDL 55 07/29/23

## 2024-08-02 NOTE — Progress Notes (Unsigned)
 Location:   [facility] Room Number: 914 Place of Service:  ALF (13) Provider: Lar[place]ance Jodie Cavey NP  Phillip Antunes X, NP  Patient Care Team: Aragorn Recker X, NP as PCP - General (Internal Medicine) Joshua Blamer, MD as Consulting Physician (Dermatology) [place], [facility] Cleotilde Garnette HERO, MD as Attending Physician [facility] Medicine)  Extended Emergency Contact Information Primary Emergency Contact: [name],[name] Address: [address] [place], [place] [address] [place] Phone: [phone] Relation: Daughter Secondary Emergency Contact: Thomsen,Carol Mobile Phone: [phone] Relation: Daughter  Code Status: DNR Goals of care: Advanced Directive information    07/22/2024   11:33 AM  Advanced Directives  Does Patient Have a Medical Advance Directive? Yes  Type of Estate agent of [place];Living will  Does patient want to make changes to medical advance directive? No - Patient declined  Copy of Healthcare Power of Attorney in Chart? Yes - validated most recent copy scanned in chart (See row information)     Chief Complaint  Patient presents with  . Acute Visit    Blood in urin, leukocytosis, malaise.     HPI:  Pt is a 89 y.o. male seen today for an acute visit for generalized weakness, noted blood in urine, but denied burning sensation or increased urinary frequency/urgency, denied abd pain, he is afebrile, no noted SOB, cough, no O2 desaturation. State wbc 21.5 08/31/24, HPOA/patient desires antibiotics at [facility] instead of ED eval/tx.     HTN, on Metoprolol , Amlodipine ,  Valsartan , prn Clonidine ,  Bun/creat 17/1.1 07/31/24             HLD on Simvastatin , LDL 55 07/29/23             Hypothyroidism, on Levothyroxine , TSH 3.92 07/29/23             BPH, urinary frequency, on Terazosin , Proscar , saw Urology              Afib, heart rate is in control, taking Eliquis , Metoprolol . F/u Cardiology.              OA R shoulder, [name]-ray showed  DJD, on Tylenol .              MD, followed by Ophthalmology.              GERD stable, on Pantoprazole . Hgb 15.2 07/31/24             Depression, improved, situational,  GDR Lexapro -off, stable.              Visual hallucination, resolved,  CT head negative, underwent neurology evaluation             Gait abnormality, uses walker out of his room,  risk of falling.              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 MMSE 29/30 03/21/23, CT head microvascular ischemic disease and cerebral atrophy. Vit B12 465 07/04/22. Saw neurology, 03/26/23 MOST full scope of tx, IVF/ABT indicated.        Past Medical History:  Diagnosis Date  . Atrial fibrillation (HCC) 03/26/2011  . Basal cell carcinoma of other specified sites of skin 4/10/012  . Basal cell carcinoma of skin, site unspecified 03/25/2012  . Benign neoplasm of colon 03/25/2012   adenomatous polyps  . Broken arm 11/25/2017  . Colon polyp 2014  . Degeneration of lumbar or lumbosacral intervertebral disc 09/24/2011  .  Disturbance of skin sensation 01/28/2012  . Diverticulosis 2014  . Dysphagia, unspecified(787.20) 03/26/2011  . Elevated prostate specific antigen (PSA) 03/26/2011  . Exposure to unspecified radiation 03/26/2011  . Family history of colon cancer 07/16/2013  . GERD (gastroesophageal reflux disease) 07/16/2013  . Hearing deficit 12/21/2013  . Hemangioma of unspecified site 03/26/2011  . Hx of adenomatous colonic polyps 07/16/2013  . Hypertrophy of prostate with urinary obstruction and other lower urinary tract symptoms (LUTS) 03/26/2011  . Ingrowing nail 03/26/2011  . Internal hemorrhoids without mention of complication 06/15/13  . Keratosis, actinic 01/28/2012  . Long term (current) use of anticoagulants 03/26/2011  . Long term current use of anticoagulant therapy 07/23/2016  . Lumbago 03/26/2011  . 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 Osteoarthrosis, unspecified whether generalized or localized,  unspecified site 11/24/2012  . Other and unspecified hyperlipidemia 03/26/2011  . Other malaise and fatigue 03/26/2011  . Pain in joint, forearm 11/24/2012  . Pain in joint, lower leg 06/15/13   left knee  . Paresthesia of left arm 07/05/2014  . Rectal bleed 06/15/13  . Reflux esophagitis 03/26/2011  . Sacroiliitis, not elsewhere classified (HCC) 05/28/2011  . Scoliosis (and kyphoscoliosis), idiopathic 05/28/2011  . 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 Tinnitus 12/21/2013  . 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 Unspecified essential hypertension 03/26/2011   Past Surgical History:  Procedure Laterality Date  . BASAL CELL CARCINOMA EXCISION Left 12/10/14   Dr. Bard Molt  . COLONOSCOPY  08/12/2013   Dr. Albertus adenomatous polyps  . CRYOTHERAPY Left 2009   hand  . PHOTODYNAMIC THERAPY  09/26/14   face and ear lobs  Dr. Bard Molt  . SQUAMOUS CELL CARCINOMA EXCISION Left 2010   hand    Allergies  Allergen Reactions  . Ciprofloxacin Itching    Patient doesn't like the way it makes him feel  . Lisinopril Itching    unknown  . Sulfa Antibiotics Rash    Allergies as of 08/02/2024       Reactions   Ciprofloxacin Itching   Patient doesn't like the way it makes him feel   Lisinopril Itching   unknown   Sulfa Antibiotics Rash        Medication List        Accurate as of August 02, 2024 12:25 PM. If you have any questions, ask your nurse or doctor.          acetaminophen  500 MG tablet Commonly known as: TYLENOL  Take 500 mg by mouth daily.   amLODipine  2.5 MG tablet Commonly known as: NORVASC  TAKE 1 TABLET DAILY   cloNIDine  0.1 MG tablet Commonly known as: CATAPRES  TAKE 1 TABLET DAILY AS NEEDED, IF BLOOD PRESSURE GREATER THAN 180/100   Eliquis  5 MG Tabs tablet Generic drug: apixaban  TAKE 1 TABLET TWICE A DAY FOR ANTICOAGULATION   famotidine  20 MG  tablet Commonly known as: PEPCID  TAKE 1 TABLET DAILY AS NEEDED FOR HEARTBURN OR INDIGESTION   finasteride  5 MG tablet Commonly known as: PROSCAR  TAKE 1 TABLET DAILY FOR URINATING DRIBBLING/PROSTATE ENLARGEMENT   levothyroxine  25 MCG tablet Commonly known as: SYNTHROID  TAKE 1 TABLET DAILY BEFORE BREAKFAST   metoprolol  succinate 50 MG 24 hr tablet Commonly known as: TOPROL -XL TAKE 1 TABLET TWICE A DAY   WITH OR IMMEDIATELY FOLLOWING A MEAL TO CONTROL BLOOD PRESSURE AND HEART RHYTHM   pantoprazole  40 MG tablet Commonly known as:  PROTONIX  TAKE 1 TABLET DAILY   PRESERVISION AREDS PO Take by mouth.   simvastatin  10 MG tablet Commonly known as: ZOCOR  TAKE 1 TABLET DAILY TO LOWER CHOLESTEROL   terazosin  2 MG capsule Commonly known as: HYTRIN  TAKE 1 CAPSULE DAILY   valsartan  320 MG tablet Commonly known as: DIOVAN  TAKE 1 TABLET DAILY        Review of Systems  Constitutional:  Positive for appetite change and fatigue. Negative for fever.  HENT:  Positive for hearing loss. Negative for congestion and trouble swallowing.   Eyes:  Negative for visual disturbance.  Respiratory:  Negative for cough, shortness of breath and wheezing.   Cardiovascular:  Positive for leg swelling.  Gastrointestinal:  Negative for abdominal pain and constipation.  Genitourinary:  Positive for frequency and hematuria. Negative for dysuria and urgency.  Musculoskeletal:  Positive for arthralgias and gait problem.  Skin:  Negative for color change.  Neurological:  Negative for speech difficulty, weakness and headaches.  Psychiatric/Behavioral:  Negative for confusion, hallucinations and sleep disturbance.        Feels better    Immunization History  Administered Date(s) Administered  . Fluad Quad(high Dose 65+) 10/07/2022, 09/17/2023  . Influenza Split 11/18/2007, 10/23/2009, 10/10/2010  . Influenza Whole 09/15/2012, 09/16/2013  . Influenza, High Dose Seasonal PF 09/24/2017, 09/20/2021  .  Influenza,inj,Quad PF,6+ Mos 09/17/2018  . Influenza-Unspecified 09/29/2014, 09/14/2015, 09/26/2016, 09/05/2020  . Moderna Covid-19 Vaccine Bivalent Booster 73yrs & up 09/05/2021, 10/17/2022  . Moderna SARS-COV2 Booster Vaccination 12/31/2019, 05/23/2021, 10/01/2023  . Moderna Sars-Covid-2 Vaccination 12/20/2019, 01/17/2020  . PPD Test 10/10/2010, 11/27/2010  . Pneumococcal Conjugate-13 02/08/2016  . Pneumococcal Polysaccharide-23 01/17/1996  . Pneumococcal-Unspecified 01/17/1996  . Td 02/14/2006, 09/04/2016  . Td (Adult), 2 Lf Tetanus Toxid, Preservative Free 02/14/2006, 09/04/2016  . Tdap 03/05/2006  . Zoster Recombinant(Shingrix) 03/31/2018, 06/23/2018  . Zoster, Live 03/05/2006   Pertinent  Health Maintenance Due  Topic Date Due  . INFLUENZA VACCINE  08/16/2024 (Originally 07/16/2024)  . Colonoscopy  Discontinued      06/22/2022    3:41 PM 07/04/2022    1:24 PM 10/21/2022    9:10 AM 03/20/2023    4:18 PM 05/07/2023    2:50 PM  Fall Risk  Falls in the past year?  1 1 0 0  Was there an injury with Fall?  0 1 0 0  Fall Risk Category Calculator  2 3 0 0  Fall Risk Category (Retired)  Moderate  High     (RETIRED) Patient Fall Risk Level Moderate fall risk  High fall risk  High fall risk     Patient at Risk for Falls Due to    No Fall Risks   Fall risk Follow up   Falls evaluation completed  Falls evaluation completed Falls evaluation completed     Data saved with a previous flowsheet row definition   Functional Status Survey:    Vitals:   08/02/24 1213  BP: 126/86  Pulse: 88  Resp: 18  Temp: 97.7 F (36.5 C)  SpO2: 97%  Weight: 198 lb 12.8 oz (90.2 kg)   Body mass index is 31.14 kg/m. Physical Exam Constitutional:      Comments: fatigue  HENT:     Head: Normocephalic and atraumatic.     Right Ear: Tympanic membrane normal.     Left Ear: Tympanic membrane normal.     Nose: Nose normal.     Mouth/Throat:     Mouth: Mucous membranes are moist.  Eyes:  Extraocular  Movements: Extraocular movements intact.     Conjunctiva/sclera: Conjunctivae normal.     Pupils: Pupils are equal, round, and reactive to light.  Cardiovascular:     Rate and Rhythm: Normal rate. Rhythm irregular.     Heart sounds: No murmur heard. Pulmonary:     Effort: Pulmonary effort is normal.     Breath sounds: No rales.  Abdominal:     Palpations: Abdomen is soft.     Tenderness: There is no abdominal tenderness. There is no right CVA tenderness, left CVA tenderness, guarding or rebound.  Musculoskeletal:        General: Normal range of motion.     Cervical back: Normal range of motion and neck supple.     Right lower leg: Edema present.     Left lower leg: Edema present.     Comments: Trace edema BLE  Skin:    General: Skin is warm and dry.  Neurological:     General: No focal deficit present.     Mental Status: He is alert and oriented to person, place, and time. Mental status is at baseline.     Gait: Gait abnormal.  Psychiatric:        Mood and Affect: Mood normal.        Behavior: Behavior normal.        Thought Content: Thought content normal.     Labs reviewed: Recent Labs    02/12/24 0000  NA 142  K 4.1  CL 109*  CO2 22  BUN 16  CREATININE 1.1  CALCIUM 8.8   Recent Labs    02/12/24 0000  AST 15  ALT 9*  ALKPHOS 78  ALBUMIN 3.5   Recent Labs    02/12/24 0000  WBC 6.1  HGB 14.2  HCT 42  PLT 159   Lab Results  Component Value Date   TSH 3.05 02/12/2024   No results found for: HGBA1C Lab Results  Component Value Date   CHOL 111 07/29/2023   HDL 37 07/29/2023   LDLCALC 55 07/29/2023   TRIG 108 07/29/2023   CHOLHDL 2.5 10/15/2022    Significant Diagnostic Results in last 30 days:  No results found.  Assessment/Plan: Leukocytosis generalized weakness, noted blood in urine, but denied burning sensation or increased urinary frequency/urgency, denied abd pain, he is afebrile, no noted SOB, cough, no O2 desaturation. State wbc 21.5  08/31/24, HPOA/patient desires antibiotics at [facility] instead of ED eval/tx.  07/31/24 wbc 21.5, Hgb 15.2, plt 160, neutrophils 94, Na 136, K 4.4, Bun 17, creat 1.1  Continue 7 day course of empirical Rocephin  1gm IM every day Pending UA C/S Will repeat CBC/diff, CMP/eGFR in am.   Essential hypertension on Metoprolol , Amlodipine ,  Valsartan , prn Clonidine ,  Bun/creat 17/1.1 07/31/24  Hyperlipidemia  on Simvastatin , LDL 55 07/29/23  Hypothyroidism  on Levothyroxine , TSH 3.92 07/29/23  BPH with obstruction/lower urinary tract symptoms urinary frequency, on Terazosin , Proscar , saw Urology   Atrial fibrillation (HCC) heart rate is in control, taking Eliquis , Metoprolol . F/u Cardiology.  May hold Eliquis  if blood in urine continues.   GERD (gastroesophageal reflux disease) stable, on Pantoprazole . 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 MMSE 29/30 03/21/23, CT head microvascular ischemic disease and cerebral atrophy. Vit B12 465 07/04/22. Saw neurology, 03/26/23 MOST full scope of tx, IVF/ABT indicated.     Family/ staff Communication: ***  Labs/tests ordered:  ***

## 2024-08-02 NOTE — Assessment & Plan Note (Signed)
 on Metoprolol , Amlodipine ,  Valsartan , prn Clonidine ,  Bun/creat 17/1.1 07/31/24

## 2024-08-02 NOTE — Assessment & Plan Note (Signed)
 stable, on Pantoprazole . Hgb 15.2 07/31/24

## 2024-08-02 NOTE — Assessment & Plan Note (Signed)
on Levothyroxine, TSH 3.92 07/29/23

## 2024-08-20 ENCOUNTER — Other Ambulatory Visit: Payer: Self-pay | Admitting: Nurse Practitioner

## 2024-08-31 ENCOUNTER — Other Ambulatory Visit: Payer: Self-pay | Admitting: Nurse Practitioner

## 2024-11-14 ENCOUNTER — Other Ambulatory Visit: Payer: Self-pay | Admitting: Nurse Practitioner

## 2024-12-20 ENCOUNTER — Other Ambulatory Visit: Payer: Self-pay | Admitting: Internal Medicine

## 2025-01-05 ENCOUNTER — Ambulatory Visit (INDEPENDENT_AMBULATORY_CARE_PROVIDER_SITE_OTHER): Admitting: Physician Assistant

## 2025-01-05 ENCOUNTER — Encounter (INDEPENDENT_AMBULATORY_CARE_PROVIDER_SITE_OTHER): Payer: Self-pay | Admitting: Physician Assistant

## 2025-01-05 VITALS — BP 149/87 | HR 72 | Temp 97.5°F

## 2025-01-05 DIAGNOSIS — H6121 Impacted cerumen, right ear: Secondary | ICD-10-CM | POA: Diagnosis not present

## 2025-01-05 NOTE — Progress Notes (Signed)
 Dear Dr. Adline, Here is my assessment for our mutual patient, Phillip [name]. Thank you for allowing me the opportunity to care for your patient. Please do not hesitate to contact me should you have any other questions. Sincerely, Chyrl Cohen PA-C  Otolaryngology Clinic Note Referring provider: Dr. Adline HPI:  Phillip [name] is a 89 y.o. male kindly referred by Dr. Adline    89 year old gentleman seen in our office for follow-up evaluation of cerumen impaction.  He was last seen in the office on 07/02/2024.  Below is a recap of that encounter.  The patient is a 89 year old male seen in the office for follow-up evaluation of cerumen impaction.  He is accompanied by his daughter today.  He was last seen in the office on 04/02/2024.   Since the last office visit they note they have gone back to audiology, he has new hearing aids.  His daughter is concerned that he has reaccumulation of wax.  She notes that when she removes the hearing aids from his ear they have wax on them and has to constantly clean them.  No other acute concerns today.  Update 01/05/2025.  Since his last office visit he denies any significant changes.  He is accompanied by his daughter who reports that he did get new hearing aids although she feels one of them is clogged.  Independent Review of Additional Tests or Records:  None   PMH/Meds/All/SocHx/FamHx/ROS:   Past Medical History:  Diagnosis Date   Atrial fibrillation (HCC) 03/26/2011   Basal cell carcinoma of other specified sites of skin 4/10/012   Basal cell carcinoma of skin, site unspecified 03/25/2012   Benign neoplasm of colon 03/25/2012   adenomatous polyps   Broken arm 11/25/2017   Colon polyp 2014   Degeneration of lumbar or lumbosacral intervertebral disc 09/24/2011   Disturbance of skin sensation 01/28/2012   Diverticulosis 2014   Dysphagia, unspecified(787.20) 03/26/2011   Elevated prostate specific antigen (PSA) 03/26/2011   Exposure to unspecified radiation  03/26/2011   Family history of colon cancer 07/16/2013   GERD (gastroesophageal reflux disease) 07/16/2013   Hearing deficit 12/21/2013   Hemangioma of unspecified site 03/26/2011   Hx of adenomatous colonic polyps 07/16/2013   Hypertrophy of prostate with urinary obstruction and other lower urinary tract symptoms (LUTS) 03/26/2011   Ingrowing nail 03/26/2011   Internal hemorrhoids without mention of complication 06/15/13   Keratosis, actinic 01/28/2012   Long term (current) use of anticoagulants 03/26/2011   Long term current use of anticoagulant therapy 07/23/2016   Lumbago 03/26/2011   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   Osteoarthrosis, unspecified whether generalized or localized, unspecified site 11/24/2012   Other and unspecified hyperlipidemia 03/26/2011   Other malaise and fatigue 03/26/2011   Pain in joint, forearm 11/24/2012   Pain in joint, lower leg 06/15/13   left knee   Paresthesia of left arm 07/05/2014   Rectal bleed 06/15/13   Reflux esophagitis 03/26/2011   Sacroiliitis, not elsewhere classified 05/28/2011   Scoliosis (and kyphoscoliosis), idiopathic 05/28/2011   Squamous cell carcinoma of skin of upper limb, including shoulder 03/26/2011   Tinnitus 12/21/2013   Transient ischemic attack (TIA), and cerebral infarction without residual deficits(V12.54) 03/26/2011   Unspecified essential hypertension 03/26/2011     Past Surgical History:  Procedure Laterality Date   BASAL CELL CARCINOMA EXCISION Left 12/10/14   Dr. Bard Molt   COLONOSCOPY  08/12/2013   Dr. Albertus adenomatous polyps   CRYOTHERAPY Left 2009  hand   PHOTODYNAMIC THERAPY  09/26/14   face and ear lobs  Dr. Bard Molt   SQUAMOUS CELL CARCINOMA EXCISION Left 2010   hand    Family History  Problem Relation Age of Onset   Cancer Mother        colon   Heart disease Father        MI     Social Connections: Moderately Isolated (03/25/2022)   Social Connection and Isolation Panel    Frequency of  Communication with Friends and Family: More than three times a week    Frequency of Social Gatherings with Friends and Family: More than three times a week    Attends Religious Services: Never    Database Administrator or Organizations: No    Attends Engineer, Structural: Never    Marital Status: Married     Current Medications[1]   Physical Exam:   BP (!) 149/87   Pulse 72   Temp (!) 97.5 F (36.4 C)   SpO2 95%   Pertinent Findings  CN II-XII grossly intact Cerumen impaction right EAC, left EAC with minimal cerumen No obviously palpable neck masses/lymphadenopathy/thyromegaly No respiratory distress or stridor    Seprately Identifiable Procedures:  Procedure: bilateral ear microscopy and cerumen removal using microscope (CPT [id number]) - Mod 25 Pre-procedure diagnosis: unilateral cerumen impaction right external auditory canal Post-procedure diagnosis: same Indication: unilateral  cerumen impaction; given patient's otologic complaints and history as well as for improved and comprehensive examination of external ear and tympanic membrane, bilateral otologic examination using microscope was performed and impacted cerumen removed  Procedure: Patient was placed semi-recumbent. Both ear canals were examined using the microscope with findings above. Cerumen removed from the right external auditory canal using suction and currette with improvement in EAC examination and patency. Left: EAC was patent. TM was intact . Middle ear was aerated. Drainage: none Right: EAC was patent. TM was intact . Middle ear was aerated . Drainage: none Patient tolerated the procedure well.   Impression & Plans:  Phillip [name] is a 89 y.o. male with the following   Cerumen impaction-  Removed without difficulty.  I like to see the patient back in the office in 6 months for repeat evaluation, sooner as needed.  The patient and his daughter verbalized understanding and agreement to today's  plan.   - f/u 6 months   Thank you for allowing me the opportunity to care for your patient. Please do not hesitate to contact me should you have any other questions.  Sincerely, Chyrl Cohen PA-C [facility] Phone: [phone] Fax: [phone]  01/05/2025, 10:17 AM          [1]  Current Outpatient Medications:    acetaminophen  (TYLENOL ) 500 MG tablet, Take 500 mg by mouth daily., Disp: , Rfl:    amLODipine  (NORVASC ) 2.5 MG tablet, TAKE 1 TABLET DAILY, Disp: 90 tablet, Rfl: 3   cloNIDine  (CATAPRES ) 0.1 MG tablet, TAKE 1 TABLET DAILY AS NEEDED, IF BLOOD PRESSURE GREATER THAN 180/100, Disp: 90 tablet, Rfl: 3   ELIQUIS  5 MG TABS tablet, TAKE 1 TABLET TWICE A DAY FOR ANTICOAGULATION, Disp: 180 tablet, Rfl: 3   finasteride  (PROSCAR ) 5 MG tablet, TAKE 1 TABLET DAILY FOR URINATING DRIBBLING/PROSTATE ENLARGEMENT, Disp: 90 tablet, Rfl: 3   levothyroxine  (SYNTHROID ) 25 MCG tablet, TAKE 1 TABLET DAILY BEFORE BREAKFAST, Disp: 90 tablet, Rfl: 1   metoprolol  succinate (TOPROL -XL) 50 MG 24 hr tablet, TAKE 1 TABLET TWICE A DAY  WITH OR IMMEDIATELY FOLLOWING A MEAL TO CONTROL BLOOD PRESSURE AND HEART RHYTHM, Disp: 180 tablet, Rfl: 3   Multiple Vitamins-Minerals (PRESERVISION AREDS PO), Take by mouth., Disp: , Rfl:    pantoprazole  (PROTONIX ) 40 MG tablet, TAKE 1 TABLET DAILY, Disp: 90 tablet, Rfl: 1   simvastatin  (ZOCOR ) 10 MG tablet, TAKE 1 TABLET DAILY TO LOWER CHOLESTEROL, Disp: 90 tablet, Rfl: 3   terazosin  (HYTRIN ) 2 MG capsule, TAKE 1 CAPSULE DAILY, Disp: 90 capsule, Rfl: 3   valsartan  (DIOVAN ) 320 MG tablet, TAKE 1 TABLET DAILY, Disp: 90 tablet, Rfl: 3   famotidine  (PEPCID ) 20 MG tablet, TAKE 1 TABLET DAILY AS NEEDED FOR HEARTBURN OR INDIGESTION (Patient not taking: Reported on 01/05/2025), Disp: 60 tablet, Rfl: 5

## 2025-01-05 NOTE — Progress Notes (Signed)
 Patient having BP managed

## 2025-07-06 ENCOUNTER — Ambulatory Visit (INDEPENDENT_AMBULATORY_CARE_PROVIDER_SITE_OTHER): Admitting: Physician Assistant
